# Patient Record
Sex: Male | Born: 1955
Health system: Southern US, Community
[De-identification: ages and names within clinical notes are randomized; demographics above are authoritative.]

## PROBLEM LIST (undated history)

## (undated) DIAGNOSIS — D494 Neoplasm of unspecified behavior of bladder: Secondary | ICD-10-CM

## (undated) DIAGNOSIS — I10 Essential (primary) hypertension: Secondary | ICD-10-CM

## (undated) DIAGNOSIS — R351 Nocturia: Secondary | ICD-10-CM

## (undated) DIAGNOSIS — E559 Vitamin D deficiency, unspecified: Secondary | ICD-10-CM

## (undated) DIAGNOSIS — C679 Malignant neoplasm of bladder, unspecified: Secondary | ICD-10-CM

## (undated) DIAGNOSIS — J4489 Other specified chronic obstructive pulmonary disease: Secondary | ICD-10-CM

## (undated) DIAGNOSIS — Z9289 Personal history of other medical treatment: Secondary | ICD-10-CM

## (undated) DIAGNOSIS — R630 Anorexia: Secondary | ICD-10-CM

## (undated) DIAGNOSIS — B20 Human immunodeficiency virus [HIV] disease: Secondary | ICD-10-CM

## (undated) DIAGNOSIS — J449 Chronic obstructive pulmonary disease, unspecified: Secondary | ICD-10-CM

## (undated) HISTORY — PX: JOINT REPLACEMENT: SHX530

## (undated) HISTORY — PX: TRANSURETHRAL RESECTION OF BLADDER TUMOR: SHX2575

## (undated) HISTORY — PX: LUMBAR DISC SURGERY: SHX700

## (undated) HISTORY — DX: Anorexia: R63.0

## (undated) HISTORY — PX: TOTAL HIP ARTHROPLASTY: SHX124

---
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
## 2012-08-04 HISTORY — PX: CATARACT EXTRACTION W/ INTRAOCULAR LENS  IMPLANT, BILATERAL: SHX1307

## 2014-04-10 ENCOUNTER — Encounter (HOSPITAL_COMMUNITY): Payer: Self-pay | Admitting: Emergency Medicine

## 2014-04-10 ENCOUNTER — Emergency Department (HOSPITAL_COMMUNITY)
Admission: EM | Admit: 2014-04-10 | Discharge: 2014-04-10 | Disposition: A | Discharge status: Home / Self Care | Payer: Medicare Other | Attending: Emergency Medicine | Admitting: Emergency Medicine

## 2014-04-10 DIAGNOSIS — Z859 Personal history of malignant neoplasm, unspecified: Secondary | ICD-10-CM | POA: Insufficient documentation

## 2014-04-10 DIAGNOSIS — J4489 Other specified chronic obstructive pulmonary disease: Secondary | ICD-10-CM | POA: Insufficient documentation

## 2014-04-10 DIAGNOSIS — F17200 Nicotine dependence, unspecified, uncomplicated: Secondary | ICD-10-CM | POA: Diagnosis not present

## 2014-04-10 DIAGNOSIS — J449 Chronic obstructive pulmonary disease, unspecified: Secondary | ICD-10-CM | POA: Insufficient documentation

## 2014-04-10 DIAGNOSIS — Z21 Asymptomatic human immunodeficiency virus [HIV] infection status: Secondary | ICD-10-CM | POA: Insufficient documentation

## 2014-04-10 DIAGNOSIS — E871 Hypo-osmolality and hyponatremia: Secondary | ICD-10-CM | POA: Diagnosis not present

## 2014-04-10 DIAGNOSIS — C679 Malignant neoplasm of bladder, unspecified: Secondary | ICD-10-CM | POA: Diagnosis not present

## 2014-04-10 LAB — CBC WITH DIFFERENTIAL/PLATELET
BASOS PCT: 0 % (ref 0–1)
Basophils Absolute: 0 10*3/uL (ref 0.0–0.1)
Eosinophils Absolute: 0.2 10*3/uL (ref 0.0–0.7)
Eosinophils Relative: 3 % (ref 0–5)
HCT: 37.5 % — ABNORMAL LOW (ref 39.0–52.0)
Hemoglobin: 13.3 g/dL (ref 13.0–17.0)
Lymphocytes Relative: 46 % (ref 12–46)
Lymphs Abs: 2.4 10*3/uL (ref 0.7–4.0)
MCH: 33.7 pg (ref 26.0–34.0)
MCHC: 35.5 g/dL (ref 30.0–36.0)
MCV: 94.9 fL (ref 78.0–100.0)
Monocytes Absolute: 0.6 10*3/uL (ref 0.1–1.0)
Monocytes Relative: 11 % (ref 3–12)
NEUTROS PCT: 40 % — AB (ref 43–77)
Neutro Abs: 2.1 10*3/uL (ref 1.7–7.7)
PLATELETS: 206 10*3/uL (ref 150–400)
RBC: 3.95 MIL/uL — ABNORMAL LOW (ref 4.22–5.81)
RDW: 13 % (ref 11.5–15.5)
WBC: 5.2 10*3/uL (ref 4.0–10.5)

## 2014-04-10 LAB — COMPREHENSIVE METABOLIC PANEL
ALK PHOS: 121 U/L — AB (ref 39–117)
ALT: 20 U/L (ref 0–53)
ANION GAP: 17 — AB (ref 5–15)
AST: 58 U/L — ABNORMAL HIGH (ref 0–37)
Albumin: 3.8 g/dL (ref 3.5–5.2)
BUN: 9 mg/dL (ref 6–23)
CO2: 21 mEq/L (ref 19–32)
Calcium: 9.4 mg/dL (ref 8.4–10.5)
Chloride: 90 mEq/L — ABNORMAL LOW (ref 96–112)
Creatinine, Ser: 0.81 mg/dL (ref 0.50–1.35)
GFR calc Af Amer: >90 mL/min (ref >90)
GFR calc non Af Amer: >90 mL/min (ref >90)
Glucose, Bld: 81 mg/dL (ref 70–99)
POTASSIUM: 4.6 meq/L (ref 3.7–5.3)
SODIUM: 128 meq/L — AB (ref 137–147)
TOTAL PROTEIN: 9.8 g/dL — AB (ref 6.0–8.3)
Total Bilirubin: 0.3 mg/dL (ref 0.3–1.2)

## 2014-04-10 LAB — URINALYSIS, ROUTINE W REFLEX MICROSCOPIC
BILIRUBIN URINE: NEGATIVE
Glucose, UA: NEGATIVE mg/dL
Ketones, ur: NEGATIVE mg/dL
Leukocytes, UA: NEGATIVE
NITRITE: NEGATIVE
PH: 5.5 (ref 5.0–8.0)
Protein, ur: NEGATIVE mg/dL
SPECIFIC GRAVITY, URINE: 1.011 (ref 1.005–1.030)
Urobilinogen, UA: 0.2 mg/dL (ref 0.0–1.0)

## 2014-04-10 LAB — ETHANOL: Alcohol, Ethyl (B): 269 mg/dL — ABNORMAL HIGH (ref 0–11)

## 2014-04-10 LAB — URINE MICROSCOPIC-ADD ON

## 2014-04-10 MED ORDER — SODIUM CHLORIDE 0.9 % IV BOLUS (SEPSIS): 500.0000 mL | Freq: Once | INTRAVENOUS | Status: DC

## 2014-04-10 MED ORDER — POLYETHYLENE GLYCOL 3350 17 G PO PACK: 17.0000 g | PACK | Freq: Every day | ORAL | Status: DC

## 2014-04-10 MED ORDER — OXYCODONE-ACETAMINOPHEN 5-325 MG PO TABS
1.0000 | ORAL_TABLET | Freq: Once | ORAL | Status: AC
  Administered 2014-04-10: 1 via ORAL
  Filled 2014-04-10: qty 1

## 2014-04-10 MED ORDER — OXYCODONE-ACETAMINOPHEN 5-325 MG PO TABS: 1.0000 | ORAL_TABLET | Freq: Four times a day (QID) | ORAL | Status: DC | PRN

## 2014-04-10 MED ORDER — MORPHINE SULFATE 4 MG/ML IJ SOLN: 4.0000 mg | Freq: Once | INTRAMUSCULAR | Status: DC

## 2014-04-10 NOTE — ED Notes (Signed)
Per EMS, pt from home. Daughter called EMS due to pt c/o abdominal pain. Pt has hx of bladder cancer but hasn't been able to afford treatments. Pt has had no treatment in 3 months. Pt's pain 4/10 but pt sts that it will spike up to a 10/10. Pt also c/o general fatigue. Pt did not want to come. Pt currently intoxicated. A&Ox4. 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 Allergic to aspirin. Daughter is concerned cancer has spread to lungs.

## 2014-04-10 NOTE — Progress Notes (Signed)
  CARE MANAGEMENT ED NOTE 04/10/2014  Patient:  [facility]   Account Number:  [id number]  Date Initiated:  04/10/2014  Documentation initiated by:  Livia Snellen  Subjective/Objective Assessment:   Patient presents to Ed with abdominal pain.     Subjective/Objective Assessment Detail:   Patient with pmhx of bladder cancer.  Patient has not had chemo treatments in three months.     Action/Plan:   Action/Plan Detail:   Anticipated DC Date:       Status Recommendation to Physician:   Result of Recommendation:    Other ED Services  Consult Working [place]  CM consult  Other  PCP issues    Choice offered to / List presented to:            Status of service:  Completed, signed off  ED Comments:   ED Comments Detail:  EDCM spoke to patient and his daughter at bedside.  Patient confirms he has Medicare and Medicaid insurance.  Patient reports he has moved here from [facility].  Patient has switched his Medicaid  to [facility].  [facility] asked patient why he was unable to receive treatments for his cancer for three months?  Patient responded, "Because of my Medicaid and the move."  Patient reports his pcp is located at [facility] in [facility], Dr. Jarrett Ables [phone].  As per patient and patient's daughter, Dr. Jarrett Ables referred patient to [facility] cancer center.  When patient's daughter showed [facility] the appointment it was for Dr. Louis Meckel a urologist on [address]  EDCM explained to patient and his daughter that this appointment was not for the cancer center but for a urologist.  Patient has an appointment with Dr. Louis Meckel on Sept 17 at 0930am with Dr. Louis Meckel.  [facility] provided patient with a list of pcps who accept Medicare insurance within a 10 mile radius of patient's zip code [address].  EDCM also provided patient and his daughter with phone number and address to [facility] cancer center.  Patient and patient's daughter thankful for resources.  No further EDCM needs at this  time.

## 2014-04-10 NOTE — ED Notes (Signed)
Pt refusing IV medication and fluids. MD made aware.

## 2014-04-10 NOTE — ED Notes (Signed)
Bed: WA02 Expected date:  Expected time:  Means of arrival:  Comments: EMS- abdominal pain, Hx of bladder CA

## 2014-04-10 NOTE — Discharge Instructions (Signed)
Bladder Cancer Bladder cancer is an abnormal growth of tissue in your bladder. Your bladder is the balloon-like sac in your pelvis. It collects and stores urine that comes from the kidneys through the ureters. The bladder wall is made of layers. If cancer spreads into these layers and through the wall of the bladder, it becomes more difficult to treat.  There are four stages of bladder cancer:  Stage I. Cancer at this stage occurs in the bladder's inner lining but has not invaded the muscular bladder wall.  Stage II. At this stage, cancer has invaded the bladder wall but is still confined to the bladder.  Stage III. By this stage, the cancer cells have spread through the bladder wall to surrounding tissue. They may also have spread to the prostate in men or the uterus or vagina in women.  Stage IV. By this stage, cancer cells may have spread to the lymph nodes and other organs, such as your lungs, bones, or liver. RISK FACTORS Although the cause of bladder cancer is not known, the following risk factors can increase your chances of getting bladder cancer:   Smoking.   Occupational exposures, such as rubber, leather, textile, dyes, chemicals, and paint.  Being white.  Age.   Being male.   Having chronic bladder inflammation.   Having a bladder cancer history.   Having a family history of bladder cancer (heredity).   Having had chemotherapy or radiation therapy to the pelvis.   Being exposed to arsenic.  SYMPTOMS   Blood in the urine.   Pain with urination.   Frequent bladder or urine infections.  Increase in urgency and frequency of urination. DIAGNOSIS  Your health care provider may suspect bladder cancer based on your description of urinary symptoms or based on the finding of blood or infection in the urine (especially if this has recurred several times). Other tests or procedures that may be performed include:   A narrow tube being inserted into your bladder  through your urethra (cystoscopy) in order to view the lining of your bladder for tumors.   A biopsy to sample the tumor to see if cancer is present.  If cancer is present, it will then be staged to determine its severity and extent. It is important to know how deeply into the bladder wall the cancer has grown and whether the cancer has spread to any other parts of your body. Staging may require blood tests or special scans such as a CT scan, MRI, bone scan, or chest X-ray.  TREATMENT  Once your cancer has been diagnosed and staged, you should discuss a treatment plan with your health care provider. Based on the stage of the cancer, one treatment or a combination of treatments may be recommended. The most common forms of treatment are:   Surgery. Procedures that may be done include transurethral resection and cystectomy.  Radiation therapy. This is infrequently used to treat bladder cancer.   Chemotherapy. During this treatment, drugs are used to kill cancer cells.  Immunotherapy. This is usually administered directly into the bladder. HOME CARE INSTRUCTIONS  Take medicines only as directed by your health care provider.   Maintain a healthy diet.   Consider joining a support group. This may help you learn to cope with the stress of having bladder cancer.   Seek advice to help you manage treatment side effects.   Keep all follow-up visits as directed by your health care provider.   Inform your cancer specialist if you are  admitted to the hospital.  [place] IF:  There is blood in your urine.  You have symptoms of a urinary tract infection. These include:  Tiredness.  Shakiness.  Weakness.  Muscle aches.  Abdominal pain.  Frequent and intense urge to urinate (in young women).  Burning feeling in the bladder or urethra during urination (in young women). SEEK IMMEDIATE MEDICAL CARE IF:  You are unable to urinate. Document Released: 07/24/2003 Document  Revised: 12/05/2013 Document Reviewed: 01/11/2013 [facility] Patient Information 2015 [place], [place]. This information is not intended to replace advice given to you by your health care provider. Make sure you discuss any questions you have with your health care provider.  Hyponatremia  Hyponatremia is when the amount of salt (sodium) in your blood is too low. When sodium levels are low, your cells will absorb extra water and swell. The swelling happens throughout the body, but it mostly affects the brain. Severe brain swelling (cerebral edema), seizures, or coma can happen.  CAUSES   Heart, kidney, or liver problems.  Thyroid problems.  Adrenal gland problems.  Severe vomiting and diarrhea.  Certain medicines or illegal drugs.  Dehydration.  Drinking too much water.  Low-sodium diet. SYMPTOMS   Nausea and vomiting.  Confusion.  Lethargy.  Agitation.  Headache.  Twitching or shaking (seizures).  Unconsciousness.  Appetite loss.  Muscle weakness and cramping. DIAGNOSIS  Hyponatremia is identified by a simple blood test. Your caregiver will perform a history and physical exam to try to find the cause and type of hyponatremia. Other tests may be needed to measure the amount of sodium in your blood and urine. TREATMENT  Treatment will depend on the cause.   Fluids may be given through the vein (IV).  Medicines may be used to correct the sodium imbalance. If medicines are causing the problem, they will need to be adjusted.  Water or fluid intake may be restricted to restore proper balance. The speed of correcting the sodium problem is very important. If the problem is corrected too fast, nerve damage (sometimes unchangeable) can happen. HOME CARE INSTRUCTIONS   Only take medicines as directed by your caregiver. Many medicines can make hyponatremia worse. Discuss all your medicines with your caregiver.  Carefully follow any recommended diet, including any fluid  restrictions.  You may be asked to repeat lab tests. Follow these directions.  Avoid alcohol and recreational drugs. SEEK MEDICAL CARE IF:   You develop worsening nausea, fatigue, headache, confusion, or weakness.  Your original hyponatremia symptoms return.  You have problems following the recommended diet. SEEK IMMEDIATE MEDICAL CARE IF:   You have a seizure.  You faint.  You have ongoing diarrhea or vomiting. MAKE SURE YOU:   Understand these instructions.  Will watch your condition.  Will get help right away if you are not doing well or get worse. Document Released: 07/11/2002 Document Revised: 10/13/2011 Document Reviewed: 01/05/2011 [facility] Patient Information 2015 [place], [place]. This information is not intended to replace advice given to you by your health care provider. Make sure you discuss any questions you have with your health care provider.

## 2014-04-10 NOTE — ED Provider Notes (Signed)
CSN: [id number]     Arrival date & time 04/10/14  1756 History   First MD Initiated Contact with Patient 04/10/14 1809     Chief Complaint  Patient presents with  . Bladder Cancer     (Consider location/radiation/quality/duration/timing/severity/associated sxs/prior Treatment) The history is provided by the patient.   patient presents with lower abdominal pain. He has a history of reported bladder cancer. He has been off his treatment for around 3 months. He was previously being treated in [facility]. Mood appeared to be by his family. He he was unable to get medications appear due to insurance reasons. His appointment to followup with urology. He does not have a primary care doctor or primary care appointment. I called his urologist from [place], but the numbers did not go through. He states the pain comes and goes. He states it will weakness leg swelling comes on. No diarrhea or constipation. He states that he drank 2 beers today. Also reportedly has HIV, but was not one of his family to know.  Past Medical History  Diagnosis Date  . Cancer   . Asthma   . COPD (chronic obstructive pulmonary disease)    Past Surgical History  Procedure Laterality Date  . Joint replacement     No family history on file. History  Substance Use Topics  . Smoking status: Current Every Day Smoker -- 0.50 packs/day  . Smokeless tobacco: Not on file  . Alcohol Use: Yes    Review of Systems  Constitutional: Positive for appetite change. Negative for activity change.  Eyes: Negative for pain.  Respiratory: Negative for chest tightness and shortness of breath.   Cardiovascular: Negative for chest pain and leg swelling.  Gastrointestinal: Positive for abdominal pain. Negative for nausea, vomiting and diarrhea.  Genitourinary: Negative for flank pain.  Musculoskeletal: Negative for back pain and neck stiffness.  Skin: Negative for rash.  Neurological: Negative for weakness, numbness and  headaches.  Psychiatric/Behavioral: Negative for behavioral problems.      Allergies  Aspirin  Home Medications   Prior to Admission medications   Medication Sig Start Date End Date Taking? Authorizing Provider  PRESCRIPTION MEDICATION Not sure of name or dosing.   Yes Historical Provider, MD   BP 166/67  Pulse 67  Temp(Src) 98.2 F (36.8 C) (Oral)  Resp 16  SpO2 100% Physical Exam  Nursing note and vitals reviewed. Constitutional: He is oriented to person, place, and time. He appears well-developed and well-nourished.  HENT:  Head: Normocephalic and atraumatic.  Eyes: EOM are normal. Pupils are equal, round, and reactive to light.  Neck: Normal range of motion. Neck supple. JVD present.  Cardiovascular: Normal rate, regular rhythm and normal heart sounds.   No murmur heard. Pulmonary/Chest: Effort normal and breath sounds normal.  Abdominal: Soft. Bowel sounds are normal. He exhibits no distension and no mass. There is tenderness. There is no rebound and no guarding.  Minimal lower abdominal pain without rebound or guarding.  Musculoskeletal: Normal range of motion. He exhibits no edema.  Neurological: He is alert and oriented to person, place, and time. No cranial nerve deficit.  Skin: Skin is warm and dry.  Psychiatric: He has a normal mood and affect.    ED Course  Procedures (including critical care time) Labs Review Labs Reviewed  CBC WITH DIFFERENTIAL - Abnormal; Notable for the following:    RBC 3.95 (*)    HCT 37.5 (*)    Neutrophils Relative % 40 (*)  All other components within normal limits  COMPREHENSIVE METABOLIC PANEL - Abnormal; Notable for the following:    Sodium 128 (*)    Chloride 90 (*)    Total Protein 9.8 (*)    AST 58 (*)    Alkaline Phosphatase 121 (*)    Anion gap 17 (*)    All other components within normal limits  URINALYSIS, ROUTINE W REFLEX MICROSCOPIC - Abnormal; Notable for the following:    Hgb urine dipstick TRACE (*)     All other components within normal limits  ETHANOL - Abnormal; Notable for the following:    Alcohol, Ethyl (B) 269 (*)    All other components within normal limits  URINE MICROSCOPIC-ADD ON    Imaging Review No results found.   EKG Interpretation None      MDM   Final diagnoses:  Malignant neoplasm of urinary bladder, unspecified site  Hyponatremia    Patient presents with reported bladder cancer. His been off his medications. Has followup. He is somewhat intoxicated. Mild hyponatremia. Patient refused IV. Patient has been seen by social work who gave him resources in terms of finding a PCP. He has urology followup. He'll likely need oncology followup. Will discharge home with some pain medicines. Laboratory reassuring.    Jasper Riling. Alvino Chapel, MD 04/10/14 2120

## 2014-05-09 ENCOUNTER — Other Ambulatory Visit: Payer: Self-pay | Admitting: Urology

## 2014-05-25 ENCOUNTER — Encounter (HOSPITAL_BASED_OUTPATIENT_CLINIC_OR_DEPARTMENT_OTHER): Payer: Self-pay | Admitting: *Deleted

## 2014-05-26 ENCOUNTER — Encounter (HOSPITAL_BASED_OUTPATIENT_CLINIC_OR_DEPARTMENT_OTHER): Payer: Self-pay | Admitting: *Deleted

## 2014-05-26 NOTE — Progress Notes (Addendum)
NPO AFTER MN. ARRIVE AT 1000. NEEDS ISTAT AND EKG. WILL BRING RESCUE INHALER.  PT  TO BRING BP MEDICATION BOTTLE DOS , PT UNABLE TO SEE NAME ON BOTTLE BUT STATES TAKES IN THE EVENING. PLEASE UPDATE MED. LIST.

## 2014-05-30 NOTE — H&P (Signed)
Reason For Visit Follow-up for bladder cancer   History of Present Illness 33M presents to establish care. He has a history of bladder cancer and has been treated in [place], [place].   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     Intv: Patient moved to [facility] and needs urologist. Records reviewed. History as above. Due for repeat cysto after BCG induction. Patient denies any worsening of his voiding symptoms including dysuria, hematuria, or frequency/urgency. No incontinence.   Past Medical History Problems  1. History of asthma (Z87.09) 2. History of bladder cancer (Z85.51) 3. History of chronic obstructive lung disease (Z87.09) 4. History of hypertension (Z86.79) 5. History of stomach ulcers (Z87.19) 6. History of Hyponatremia (E87.1)  Surgical History Problems  1. History of Back Surgery 2. History of Bladder Surgery 3. History of Cataract Surgery 4. History of Hip Replacement  Current Meds 1. MiraLax Oral Powder;  Therapy: (Recorded:17Sep2015) to Recorded 2. Oxycodone-Acetaminophen 5-325 MG Oral Tablet;  Therapy: (Recorded:17Sep2015) to Recorded  Allergies Medication  1. Aspirin TABS  Family History Problems  1. Family history of Death of family member : Mother, Father   Mother at age42; heart attackFather; unknown 2. Family history of malignant neoplasm of breast (Z80.3) : Grandmother 3. Family history of myocardial infarction (Z82.49) : Mother  Social History Problems  1. Alcohol use (F10.99)   2 beers a day 2. Caffeine use (F15.90)   5-6 tea per day 3. Current every day smoker (F17.200)   0.50  packs/day for 17 yrs 4. Disabled 5. Number of children   2 sons and 4 daughters 66. Separated from significant other (Z63.5)  Vitals Vital Signs [Data Includes: Last 1 Day]  Recorded: 06Oct2015 10:13AM  Height: 5 ft 4 in Weight: 134 lb  BMI Calculated: 23 BSA Calculated: 1.65 Blood Pressure: 150 / 72 Heart Rate: 79  Results/Data Urine [Data Includes: Last 1 Day]   06Oct2015  COLOR AMBER   APPEARANCE CLEAR   SPECIFIC GRAVITY 1.010   pH 5.0   GLUCOSE NEG mg/dL  BILIRUBIN NEG   KETONE NEG mg/dL  BLOOD MOD   PROTEIN NEG mg/dL  UROBILINOGEN 0.2 mg/dL  NITRITE NEG   LEUKOCYTE ESTERASE SMALL   SQUAMOUS EPITHELIAL/HPF MODERATE   WBC 3-6 WBC/hpf  RBC 0-2 RBC/hpf  BACTERIA FEW   CRYSTALS NONE SEEN   CASTS NONE SEEN    Procedure  Procedure: Cystoscopy   Indication: History of Urothelial Carcinoma.  Informed Consent: from the patient . Specific risks including, but not limited to bleeding, infection, pain, allergic reaction etc. were explained.  Prep: The patient was prepped with hibiclens.  Anesthesia:. Local anesthesia was administered intraurethrally with 2% lidocaine jelly.  Antibiotic prophylaxis: Ciprofloxacin.  Procedure Note:  Urethral meatus:. No abnormalities.  Anterior urethra: No abnormalities.  Prostatic urethra:. Was identified. (Patient has suspicious flat area at the bladder neck/prostatic urethra at the 6:00 area concerning for CIS).  Bladder: Visulization was clear. The ureteral orifices were in the normal anatomic position bilaterally and had clear efflux of urine. A systematic survey of the bladder  demonstrated no bladder tumors or stones. The patient tolerated the procedure well.  Complications: None.    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 He is status post 6 weeks of BCG which completed in May 2015. 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   Plan Health Maintenance  1. UA With REFLEX; [Do Not Release]; Status:Complete;   Done: 06Oct2015 09:54AM Malignant neoplasm of urinary bladder, unspecified site  2. Follow-up Schedule Surgery Office  Follow-up  Status: Hold For - Appointment   Requested for: 06Oct2015 3. Cysto; Status:Complete;   Done: [id number]  Discussion/Summary The plan is to biopsy the patient's prostatic urethra in the OR. He is due for upper tract imaging, we will perform bilateral retrogrades simultaneously.

## 2014-05-31 ENCOUNTER — Encounter (HOSPITAL_BASED_OUTPATIENT_CLINIC_OR_DEPARTMENT_OTHER)
Admission: RE | Disposition: A | Discharge status: Home / Self Care | Payer: Medicare Other | Source: Ambulatory Visit | Attending: Urology

## 2014-05-31 ENCOUNTER — Encounter (HOSPITAL_BASED_OUTPATIENT_CLINIC_OR_DEPARTMENT_OTHER): Payer: Self-pay | Admitting: *Deleted

## 2014-05-31 ENCOUNTER — Ambulatory Visit (HOSPITAL_BASED_OUTPATIENT_CLINIC_OR_DEPARTMENT_OTHER): Payer: Medicare Other | Admitting: Anesthesiology

## 2014-05-31 ENCOUNTER — Encounter (HOSPITAL_BASED_OUTPATIENT_CLINIC_OR_DEPARTMENT_OTHER): Payer: Medicare Other | Admitting: Anesthesiology

## 2014-05-31 ENCOUNTER — Ambulatory Visit (HOSPITAL_BASED_OUTPATIENT_CLINIC_OR_DEPARTMENT_OTHER)
Admission: RE | Admit: 2014-05-31 | Discharge: 2014-05-31 | Disposition: A | Discharge status: Home / Self Care | Payer: Medicare Other | Source: Ambulatory Visit | Attending: Urology | Admitting: Urology

## 2014-05-31 ENCOUNTER — Ambulatory Visit (HOSPITAL_COMMUNITY): Payer: Medicare Other

## 2014-05-31 DIAGNOSIS — J45909 Unspecified asthma, uncomplicated: Secondary | ICD-10-CM | POA: Diagnosis not present

## 2014-05-31 DIAGNOSIS — N3080 Other cystitis without hematuria: Secondary | ICD-10-CM | POA: Diagnosis not present

## 2014-05-31 DIAGNOSIS — I10 Essential (primary) hypertension: Secondary | ICD-10-CM | POA: Insufficient documentation

## 2014-05-31 DIAGNOSIS — Z419 Encounter for procedure for purposes other than remedying health state, unspecified: Secondary | ICD-10-CM

## 2014-05-31 DIAGNOSIS — Z8551 Personal history of malignant neoplasm of bladder: Secondary | ICD-10-CM | POA: Insufficient documentation

## 2014-05-31 DIAGNOSIS — F17210 Nicotine dependence, cigarettes, uncomplicated: Secondary | ICD-10-CM | POA: Diagnosis not present

## 2014-05-31 DIAGNOSIS — N3289 Other specified disorders of bladder: Secondary | ICD-10-CM | POA: Diagnosis present

## 2014-05-31 DIAGNOSIS — J449 Chronic obstructive pulmonary disease, unspecified: Secondary | ICD-10-CM | POA: Insufficient documentation

## 2014-05-31 HISTORY — PX: FULGURATION OF BLADDER TUMOR: SHX6261

## 2014-05-31 HISTORY — DX: Essential (primary) hypertension: I10

## 2014-05-31 HISTORY — DX: Chronic obstructive pulmonary disease, unspecified: J44.9

## 2014-05-31 HISTORY — DX: Other specified chronic obstructive pulmonary disease: J44.89

## 2014-05-31 HISTORY — DX: Nocturia: R35.1

## 2014-05-31 HISTORY — DX: Malignant neoplasm of bladder, unspecified: C67.9

## 2014-05-31 LAB — POCT I-STAT 4, (NA,K, GLUC, HGB,HCT)
GLUCOSE: 102 mg/dL — AB (ref 70–99)
HEMATOCRIT: 37 % — AB (ref 39.0–52.0)
Hemoglobin: 12.6 g/dL — ABNORMAL LOW (ref 13.0–17.0)
Potassium: 3.9 mEq/L (ref 3.7–5.3)
SODIUM: 139 meq/L (ref 137–147)

## 2014-05-31 SURGERY — FULGURATION, NEOPLASM, BLADDER
Anesthesia: General | Site: Bladder | Laterality: Bilateral

## 2014-05-31 MED ORDER — CIPROFLOXACIN IN D5W 400 MG/200ML IV SOLN
400.0000 mg | INTRAVENOUS | Status: AC
  Administered 2014-05-31: 400 mg via INTRAVENOUS
  Filled 2014-05-31: qty 200

## 2014-05-31 MED ORDER — FENTANYL CITRATE 0.05 MG/ML IJ SOLN
INTRAMUSCULAR | Status: DC | PRN
  Administered 2014-05-31: 25 ug via INTRAVENOUS
  Administered 2014-05-31: 50 ug via INTRAVENOUS
  Administered 2014-05-31: 25 ug via INTRAVENOUS

## 2014-05-31 MED ORDER — MEPERIDINE HCL 25 MG/ML IJ SOLN
6.2500 mg | INTRAMUSCULAR | Status: DC | PRN
  Filled 2014-05-31: qty 1

## 2014-05-31 MED ORDER — OXYCODONE-ACETAMINOPHEN 5-325 MG PO TABS: 1.0000 | ORAL_TABLET | Freq: Four times a day (QID) | ORAL | Status: DC | PRN

## 2014-05-31 MED ORDER — PHENAZOPYRIDINE HCL 200 MG PO TABS: 200.0000 mg | ORAL_TABLET | Freq: Three times a day (TID) | ORAL | Status: DC | PRN

## 2014-05-31 MED ORDER — PROMETHAZINE HCL 25 MG/ML IJ SOLN
6.2500 mg | INTRAMUSCULAR | Status: DC | PRN
  Filled 2014-05-31: qty 1

## 2014-05-31 MED ORDER — PROPOFOL 10 MG/ML IV BOLUS
INTRAVENOUS | Status: DC | PRN
  Administered 2014-05-31: 200 mg via INTRAVENOUS

## 2014-05-31 MED ORDER — CIPROFLOXACIN IN D5W 400 MG/200ML IV SOLN
INTRAVENOUS | Status: AC
  Filled 2014-05-31: qty 200

## 2014-05-31 MED ORDER — IOHEXOL 350 MG/ML SOLN
INTRAVENOUS | Status: DC | PRN
  Administered 2014-05-31: 10 mL

## 2014-05-31 MED ORDER — LACTATED RINGERS IV SOLN
INTRAVENOUS | Status: DC
  Administered 2014-05-31: 11:00:00 via INTRAVENOUS
  Filled 2014-05-31: qty 1000

## 2014-05-31 MED ORDER — FENTANYL CITRATE 0.05 MG/ML IJ SOLN
INTRAMUSCULAR | Status: AC
  Filled 2014-05-31: qty 4

## 2014-05-31 MED ORDER — ONDANSETRON HCL 4 MG/2ML IJ SOLN
INTRAMUSCULAR | Status: DC | PRN
  Administered 2014-05-31: 4 mg via INTRAVENOUS

## 2014-05-31 MED ORDER — PHENAZOPYRIDINE HCL 200 MG PO TABS
200.0000 mg | ORAL_TABLET | Freq: Once | ORAL | Status: AC
  Administered 2014-05-31: 200 mg via ORAL
  Filled 2014-05-31: qty 1

## 2014-05-31 MED ORDER — OXYCODONE HCL 5 MG PO TABS
ORAL_TABLET | ORAL | Status: AC
  Filled 2014-05-31: qty 1

## 2014-05-31 MED ORDER — BELLADONNA ALKALOIDS-OPIUM 16.2-60 MG RE SUPP
RECTAL | Status: DC | PRN
  Administered 2014-05-31: 1 via RECTAL

## 2014-05-31 MED ORDER — MIDAZOLAM HCL 5 MG/5ML IJ SOLN
INTRAMUSCULAR | Status: DC | PRN
  Administered 2014-05-31 (×2): 1 mg via INTRAVENOUS

## 2014-05-31 MED ORDER — LACTATED RINGERS IV SOLN
INTRAVENOUS | Status: DC | PRN
  Administered 2014-05-31 (×2): via INTRAVENOUS

## 2014-05-31 MED ORDER — BELLADONNA ALKALOIDS-OPIUM 16.2-60 MG RE SUPP
RECTAL | Status: AC
  Filled 2014-05-31: qty 1

## 2014-05-31 MED ORDER — SODIUM CHLORIDE 0.9 % IR SOLN
Status: DC | PRN
  Administered 2014-05-31: 3000 mL via INTRAVESICAL

## 2014-05-31 MED ORDER — ACETAMINOPHEN 10 MG/ML IV SOLN
INTRAVENOUS | Status: DC | PRN
  Administered 2014-05-31: 1000 mg via INTRAVENOUS

## 2014-05-31 MED ORDER — HYDROMORPHONE HCL 1 MG/ML IJ SOLN
0.2500 mg | INTRAMUSCULAR | Status: DC | PRN
  Filled 2014-05-31: qty 1

## 2014-05-31 MED ORDER — STERILE WATER FOR IRRIGATION IR SOLN
Status: DC | PRN
  Administered 2014-05-31: 3000 mL

## 2014-05-31 MED ORDER — PHENAZOPYRIDINE HCL 100 MG PO TABS
ORAL_TABLET | ORAL | Status: AC
  Filled 2014-05-31: qty 2

## 2014-05-31 MED ORDER — OXYCODONE HCL 5 MG/5ML PO SOLN
5.0000 mg | Freq: Once | ORAL | Status: AC | PRN
  Filled 2014-05-31: qty 5

## 2014-05-31 MED ORDER — DEXAMETHASONE SODIUM PHOSPHATE 4 MG/ML IJ SOLN
INTRAMUSCULAR | Status: DC | PRN
  Administered 2014-05-31: 10 mg via INTRAVENOUS

## 2014-05-31 MED ORDER — LIDOCAINE HCL (CARDIAC) 20 MG/ML IV SOLN
INTRAVENOUS | Status: DC | PRN
  Administered 2014-05-31: 80 mg via INTRAVENOUS

## 2014-05-31 MED ORDER — MIDAZOLAM HCL 2 MG/2ML IJ SOLN
INTRAMUSCULAR | Status: AC
  Filled 2014-05-31: qty 2

## 2014-05-31 MED ORDER — LIDOCAINE HCL 2 % EX GEL
CUTANEOUS | Status: DC | PRN
  Administered 2014-05-31: 1 via URETHRAL

## 2014-05-31 MED ORDER — OXYCODONE HCL 5 MG PO TABS
5.0000 mg | ORAL_TABLET | Freq: Once | ORAL | Status: AC | PRN
  Administered 2014-05-31: 5 mg via ORAL
  Filled 2014-05-31: qty 1

## 2014-05-31 SURGICAL SUPPLY — 26 items
BAG URINE DRAINAGE (UROLOGICAL SUPPLIES) ×1 IMPLANT
BAG URO CATCHER STRL LF (DRAPE) ×3 IMPLANT
BLADE SURG 15 STRL LF DISP TIS (BLADE) IMPLANT
BLADE SURG 15 STRL SS (BLADE)
CATH FOLEY 3WAY 30CC 22FR (CATHETERS) ×1 IMPLANT
DRAPE CAMERA CLOSED 9X96 (DRAPES) ×3 IMPLANT
ELECT BUTTON HF 24-28F 2 30DE (ELECTRODE) ×1 IMPLANT
ELECT HF RESECT BIPO 24F 45 ND (CUTTING LOOP) ×1 IMPLANT
ELECT LOOP MED HF 24F 12D (CUTTING LOOP) ×1 IMPLANT
ELECT REM PT RETURN 9FT ADLT (ELECTROSURGICAL) ×3
ELECTRODE REM PT RTRN 9FT ADLT (ELECTROSURGICAL) ×1 IMPLANT
EVACUATOR MICROVAS BLADDER (UROLOGICAL SUPPLIES) ×1 IMPLANT
GLOVE BIO SURGEON STRL SZ8 (GLOVE) ×3 IMPLANT
GLOVE BIOGEL M 6.5 STRL (GLOVE) ×2 IMPLANT
GLOVE BIOGEL PI IND STRL 6.5 (GLOVE) IMPLANT
GLOVE BIOGEL PI INDICATOR 6.5 (GLOVE) ×2
GOWN STRL REUS W/ TWL XL LVL3 (GOWN DISPOSABLE) ×1 IMPLANT
GOWN STRL REUS W/TWL LRG LVL3 (GOWN DISPOSABLE) ×2 IMPLANT
GOWN STRL REUS W/TWL XL LVL3 (GOWN DISPOSABLE) ×3 IMPLANT
HOLDER FOLEY CATH W/STRAP (MISCELLANEOUS) IMPLANT
IV NS IRRIG 3000ML ARTHROMATIC (IV SOLUTION) ×6 IMPLANT
PACK CYSTO (CUSTOM PROCEDURE TRAY) ×3 IMPLANT
SET ASPIRATION TUBING (TUBING) ×1 IMPLANT
SUT ETHILON 3 0 PS 1 (SUTURE) IMPLANT
SYR 30ML LL (SYRINGE) IMPLANT
WATER STERILE IRR 3000ML UROMA (IV SOLUTION) ×2 IMPLANT

## 2014-05-31 NOTE — Anesthesia Procedure Notes (Signed)
Procedure Name: LMA Insertion Date/Time: 05/31/2014 12:47 PM Performed by: Justice Rocher Pre-anesthesia Checklist: Patient identified, Emergency Drugs available, Suction available and Patient being monitored Patient Re-evaluated:Patient Re-evaluated prior to inductionOxygen Delivery Method: Circle System Utilized Preoxygenation: Pre-oxygenation with 100% oxygen Intubation Type: IV induction Ventilation: Mask ventilation without difficulty LMA: LMA inserted LMA Size: 4.0 Number of attempts: 1 Airway Equipment and Method: bite block Placement Confirmation: positive ETCO2 Tube secured with: Tape Dental Injury: Teeth and Oropharynx as per pre-operative assessment

## 2014-05-31 NOTE — Anesthesia Preprocedure Evaluation (Addendum)
Anesthesia Evaluation  Patient identified by MRN, date of birth, ID band Patient awake  General Assessment Comment:H/O HIV per Dr. Carlton Adam note.  Reviewed: Allergy & Precautions, H&P , NPO status , Patient's Chart, lab work & pertinent test results  Airway Mallampati: II  TM Distance: >3 FB Neck ROM: Full    Dental no notable dental hx.    Pulmonary asthma , COPD COPD inhaler, Current Smoker,  breath sounds clear to auscultation  Pulmonary exam normal       Cardiovascular hypertension, Pt. on medications Rhythm:Regular Rate:Normal     Neuro/Psych  Headaches, negative psych ROS   GI/Hepatic negative GI ROS, Neg liver ROS,   Endo/Other  negative endocrine ROS  Renal/GU negative Renal ROS     Musculoskeletal negative musculoskeletal ROS (+)   Abdominal   Peds  Hematology negative hematology ROS (+)   Anesthesia Other Findings   Reproductive/Obstetrics negative OB ROS                            Anesthesia Physical Anesthesia Plan  ASA: III  Anesthesia Plan: General   Post-op Pain Management:    Induction: Intravenous  Airway Management Planned:   Additional Equipment:   Intra-op Plan:   Post-operative Plan: Extubation in OR  Informed Consent: I have reviewed the patients History and Physical, chart, labs and discussed the procedure including the risks, benefits and alternatives for the proposed anesthesia with the patient or authorized representative who has indicated his/her understanding and acceptance.   Dental advisory given  Plan Discussed with: CRNA  Anesthesia Plan Comments:         Anesthesia Quick Evaluation

## 2014-05-31 NOTE — Transfer of Care (Signed)
Immediate Anesthesia Transfer of Care Note  Patient: [name]  Procedure(s) Performed: Procedure(s) (LRB): BLADDER BIOPSY WITH FULGERATION BILATERAL RETROGRADE PYLOGRAM (Bilateral)  Patient Location: PACU  Anesthesia Type: General  Level of Consciousness: awake, sedated, patient cooperative and responds to stimulation  Airway & Oxygen Therapy: Patient Spontanous Breathing and Patient connected to face mask oxygen  Post-op Assessment: Report given to PACU RN, Post -op Vital signs reviewed and stable and Patient moving all extremities  Post vital signs: Reviewed and stable  Complications: No apparent anesthesia complications

## 2014-05-31 NOTE — Op Note (Signed)
Preoperative diagnosis:  1. History of bladder cancer with suspicious areas within prostatic urethra and trigone   Postoperative diagnosis:  1. same   Procedure: 1. Bilateral retrograde pyelogram with interpretation 2. Bladder biopsy with fulgaration  Surgeon: Ardis Hughs, MD  Anesthesia: General  Complications: None  Intraoperative findings: bilateral retrograde pyelograms demonstrated a normal caliber ureter with no evidence of filling defects within the collecting system bilaterally. The trigonal region of the bladder as well as the prostatic urethra had some erythema and a flat appearing lesion consistent with CIS or inflammation..  EBL: Minimal  Specimens:  #1 right trigonal region bladder biopsy #2 prostatic urethral biopsy #3 left trigonal region bladder biopsy  Indication: [name] is a 58 y.o. patient with history of transitional cell carcinoma with recurrent CIS. The patient presented to me   A to establish care as is initial treatment had been down in [facility]. After reviewing the node was time for him to undergo surveillance cystoscopy. His cystoscopy did show areas within the trigone and prostatic urethra that were concerning for CIS. However, his cytology was normal. After reviewing the management options for treatment, he elected to proceed with the above surgical procedure(s). We have discussed the potential benefits and risks of the procedure, side effects of the proposed treatment, the likelihood of the patient achieving the goals of the procedure, and any potential problems that might occur during the procedure or recuperation. Informed consent has been obtained.  Description of procedure:  The patient was taken to the operating room and general anesthesia was induced.  The patient was placed in the dorsal lithotomy position, prepped and draped in the usual sterile fashion, and preoperative antibiotics were administered. A preoperative  time-out was performed.   A 22 French 12.5 cystoscope was then gently passed to the patient's urethra and into the bladder under visual guidance. The 12.5 lens was exchanged for the 70 lens and a 360 cystoscopic evaluation was performed. The orthotopic ureters were noted to reflux clear yellow urine. There were no large papillary lesions, there was erythematous patches along the trigone and within the prostatic urethra. The prostatic urethra was noted to be cored out consistent with the patient's history of prostate laser ablation. The 70 lens was then re-exchanged for the 12.5 lens and bilateral retrograde pyelogram's were performed in the routine fashion, the above findings were noted. I then used the cold cup biopsy forceps were then used to biopsy the patient's bladder. 2 biopsies were taken from the right trigonal region, 2 biopsies were taken from the prostatic urethra, and 2 biopsies were taken from the left trigonal region. The tissue samples were sent separately, and the biopsy areas were then copiously fulgurated using a Bugbee cautery. Any additional erythematous regions within these areas were also fulgurated. Once all the erythematous patches had been either biopsied or fulgurated the bladder was emptied and the cystoscope was removed.  10 mL of 1% lidocaine jelly was then injected in this patient's urethra. A B&O suppository was placed in the patient's rectum. The patient was subsequently extubated and returned to PACU next condition. Ardis Hughs, M.D.

## 2014-05-31 NOTE — Discharge Instructions (Signed)
Transurethral Resection of Bladder Tumor (TURBT) or Bladder Biopsy   Definition:  Transurethral Resection of the Bladder Tumor is a surgical procedure used to diagnose and remove tumors within the bladder. TURBT is the most common treatment for early stage bladder cancer.  General instructions:     Your recent bladder surgery requires very little post hospital care but some definite precautions.  Despite the fact that no skin incisions were used, the area around the bladder incisions are raw and covered with scabs to promote healing and prevent bleeding. Certain precautions are needed to insure that the scabs are not disturbed over the next 2-4 weeks while the healing proceeds.  Because the raw surface inside your bladder and the irritating effects of urine you may expect frequency of urination and/or urgency (a stronger desire to urinate) and perhaps even getting up at night more often. This will usually resolve or improve slowly over the healing period. You may see some blood in your urine over the first 6 weeks. Do not be alarmed, even if the urine was clear for a while. Get off your feet and drink lots of fluids until clearing occurs. If you start to pass clots or don't improve call us.  Diet:  You may return to your normal diet immediately. Because of the raw surface of your bladder, alcohol, spicy foods, foods high in acid and drinks with caffeine may cause irritation or frequency and should be used in moderation. To keep your urine flowing freely and avoid constipation, drink plenty of fluids during the day (8-10 glasses). Tip: Avoid cranberry juice because it is very acidic.  Activity:  Your physical activity doesn't need to be restricted. However, if you are very active, you may see some blood in the urine. We suggest that you reduce your activity under the circumstances until the bleeding has stopped.  Bowels:  It is important to keep your bowels regular during the postoperative  period. Straining with bowel movements can cause bleeding. A bowel movement every other day is reasonable. Use a mild laxative if needed, such as milk of magnesia 2-3 tablespoons, or 2 Dulcolax tablets. Call if you continue to have problems. If you had been taking narcotics for pain, before, during or after your surgery, you may be constipated. Take a laxative if necessary.    Medication:  You should resume your pre-surgery medications unless told not to. In addition you may be given an antibiotic to prevent or treat infection. Antibiotics are not always necessary. All medication should be taken as prescribed until the bottles are finished unless you are having an unusual reaction to one of the drugs.      Post Anesthesia Home Care Instructions  Activity: Get plenty of rest for the remainder of the day. A responsible adult should stay with you for 24 hours following the procedure.  For the next 24 hours, DO NOT: -Drive a car -Operate machinery -Drink alcoholic beverages -Take any medication unless instructed by your physician -Make any legal decisions or sign important papers.  Meals: Start with liquid foods such as gelatin or soup. Progress to regular foods as tolerated. Avoid greasy, spicy, heavy foods. If nausea and/or vomiting occur, drink only clear liquids until the nausea and/or vomiting subsides. Call your physician if vomiting continues.  Special Instructions/Symptoms: Your throat may feel dry or sore from the anesthesia or the breathing tube placed in your throat during surgery. If this causes discomfort, gargle with warm salt water. The discomfort should disappear within 24 

## 2014-05-31 NOTE — Interval H&P Note (Signed)
History and Physical Interval Note:  05/31/2014 12:40 PM  [name]  has presented today for surgery, with the diagnosis of BLADDER CANCER,PROSTATIC URETHRAL CANCER  The various methods of treatment have been discussed with the patient and family. After consideration of risks, benefits and other options for treatment, the patient has consented to  Procedure(s): [place] (Bilateral) as a surgical intervention .  The patient's history has been reviewed, patient examined, no change in status, stable for surgery.  I have reviewed the patient's chart and labs.  Questions were answered to the patient's satisfaction.     Louis Meckel W

## 2014-05-31 NOTE — Anesthesia Postprocedure Evaluation (Signed)
  Anesthesia Post-op Note  Patient: [name]  Procedure(s) Performed: Procedure(s) (LRB): BLADDER BIOPSY WITH FULGERATION BILATERAL RETROGRADE PYLOGRAM (Bilateral)  Patient Location: PACU  Anesthesia Type: General  Level of Consciousness: awake and alert   Airway and Oxygen Therapy: Patient Spontanous Breathing  Post-op Pain: mild  Post-op Assessment: Post-op Vital signs reviewed, Patient's Cardiovascular Status Stable, Respiratory Function Stable, Patent Airway and No signs of Nausea or vomiting  Last Vitals:  Filed Vitals:   05/31/14 1521  BP: 160/60  Pulse: 59  Temp: 36.6 C  Resp: 18    Post-op Vital Signs: stable   Complications: No apparent anesthesia complications

## 2014-06-01 ENCOUNTER — Encounter (HOSPITAL_BASED_OUTPATIENT_CLINIC_OR_DEPARTMENT_OTHER): Payer: Self-pay | Admitting: Urology

## 2014-10-05 DIAGNOSIS — C679 Malignant neoplasm of bladder, unspecified: Secondary | ICD-10-CM | POA: Diagnosis not present

## 2014-10-05 DIAGNOSIS — R8290 Unspecified abnormal findings in urine: Secondary | ICD-10-CM | POA: Diagnosis not present

## 2015-02-23 ENCOUNTER — Telehealth: Payer: Self-pay

## 2015-02-23 NOTE — Telephone Encounter (Signed)
11:00 am  Patient walked into clinic requesting appointment for medication refills.  He signed medical release form in June, 2016 and wanted to check on status.  We had not received records and request was resubmitted via fax. He states he has Medicaid and is almost of his HIV medication which he does not know the name of.  I will call the patient for appointment once medical records are received and schedule intake and send script to pharmacy  at that time.   1:30 Message left on voice mail- medical records received.  Call for appointment .   Laverle Patter , RN

## 2015-02-26 ENCOUNTER — Telehealth: Payer: Self-pay

## 2015-02-26 DIAGNOSIS — B20 Human immunodeficiency virus [HIV] disease: Secondary | ICD-10-CM

## 2015-02-26 MED ORDER — EMTRICITAB-RILPIVIR-TENOFOV DF 200-25-300 MG PO TABS: 1.0000 | ORAL_TABLET | Freq: Every day | ORAL | Status: DC

## 2015-02-26 NOTE — Telephone Encounter (Signed)
Patient calling to see if medical records were received. He will need a refill of Complera prior to intake visit.  I have verified medication  in medical records and will call one month supply to pharmacy.   [facility] .   Laverle Patter, RN

## 2015-03-20 ENCOUNTER — Other Ambulatory Visit: Payer: Self-pay

## 2015-03-20 ENCOUNTER — Other Ambulatory Visit (HOSPITAL_COMMUNITY)
Admission: RE | Admit: 2015-03-20 | Discharge: 2015-03-20 | Disposition: A | Discharge status: Home / Self Care | Payer: Medicare Other | Source: Ambulatory Visit | Attending: Internal Medicine | Admitting: Internal Medicine

## 2015-03-20 ENCOUNTER — Ambulatory Visit: Payer: Medicare Other

## 2015-03-20 DIAGNOSIS — Z113 Encounter for screening for infections with a predominantly sexual mode of transmission: Secondary | ICD-10-CM | POA: Insufficient documentation

## 2015-03-20 DIAGNOSIS — I10 Essential (primary) hypertension: Secondary | ICD-10-CM

## 2015-03-20 DIAGNOSIS — B20 Human immunodeficiency virus [HIV] disease: Secondary | ICD-10-CM

## 2015-03-20 DIAGNOSIS — E7800 Pure hypercholesterolemia, unspecified: Secondary | ICD-10-CM

## 2015-03-20 DIAGNOSIS — J4521 Mild intermittent asthma with (acute) exacerbation: Secondary | ICD-10-CM

## 2015-03-20 DIAGNOSIS — C679 Malignant neoplasm of bladder, unspecified: Secondary | ICD-10-CM

## 2015-03-20 DIAGNOSIS — C675 Malignant neoplasm of bladder neck: Secondary | ICD-10-CM

## 2015-03-20 LAB — COMPLETE METABOLIC PANEL WITH GFR
ALT: 11 U/L (ref 9–46)
AST: 38 U/L — ABNORMAL HIGH (ref 10–35)
Albumin: 3.7 g/dL (ref 3.6–5.1)
Alkaline Phosphatase: 115 U/L (ref 40–115)
BUN: 9 mg/dL (ref 7–25)
CO2: 23 mmol/L (ref 20–31)
Calcium: 9.6 mg/dL (ref 8.6–10.3)
Chloride: 98 mmol/L (ref 98–110)
Creat: 0.93 mg/dL (ref 0.70–1.25)
GFR, Est African American: >89 mL/min (ref >=60)
GFR, Est Non African American: 88 mL/min (ref >=60)
Glucose, Bld: 84 mg/dL (ref 65–99)
Potassium: 4.9 mmol/L (ref 3.5–5.3)
Sodium: 132 mmol/L — ABNORMAL LOW (ref 135–146)
TOTAL PROTEIN: 9.3 g/dL — AB (ref 6.1–8.1)
Total Bilirubin: 0.4 mg/dL (ref 0.2–1.2)

## 2015-03-20 LAB — CBC WITH DIFFERENTIAL/PLATELET
Basophils Absolute: 0 10*3/uL (ref 0.0–0.1)
Basophils Relative: 0 % (ref 0–1)
EOS ABS: 0.1 10*3/uL (ref 0.0–0.7)
EOS PCT: 3 % (ref 0–5)
HCT: 39.4 % (ref 39.0–52.0)
Hemoglobin: 13.5 g/dL (ref 13.0–17.0)
LYMPHS ABS: 1.7 10*3/uL (ref 0.7–4.0)
Lymphocytes Relative: 48 % — ABNORMAL HIGH (ref 12–46)
MCH: 33.2 pg (ref 26.0–34.0)
MCHC: 34.3 g/dL (ref 30.0–36.0)
MCV: 96.8 fL (ref 78.0–100.0)
MONOS PCT: 16 % — AB (ref 3–12)
MPV: 9.3 fL (ref 8.6–12.4)
Monocytes Absolute: 0.6 10*3/uL (ref 0.1–1.0)
Neutro Abs: 1.2 10*3/uL — ABNORMAL LOW (ref 1.7–7.7)
Neutrophils Relative %: 33 % — ABNORMAL LOW (ref 43–77)
PLATELETS: 218 10*3/uL (ref 150–400)
RBC: 4.07 MIL/uL — ABNORMAL LOW (ref 4.22–5.81)
RDW: 14.8 % (ref 11.5–15.5)
WBC: 3.6 10*3/uL — ABNORMAL LOW (ref 4.0–10.5)

## 2015-03-20 LAB — LIPID PANEL
CHOL/HDL RATIO: 1.8 ratio (ref <=5.0)
Cholesterol: 124 mg/dL — ABNORMAL LOW (ref 125–200)
HDL: 70 mg/dL (ref >=40)
LDL CALC: 44 mg/dL (ref <130)
Triglycerides: 48 mg/dL (ref <150)
VLDL: 10 mg/dL (ref <30)

## 2015-03-20 LAB — RPR

## 2015-03-20 MED ORDER — ALBUTEROL SULFATE (2.5 MG/3ML) 0.083% IN NEBU: 2.5000 mg | INHALATION_SOLUTION | Freq: Four times a day (QID) | RESPIRATORY_TRACT | Status: DC | PRN

## 2015-03-20 MED ORDER — AMLODIPINE BESYLATE 5 MG PO TABS: 5.0000 mg | ORAL_TABLET | Freq: Every day | ORAL | Status: DC

## 2015-03-20 MED ORDER — ALBUTEROL SULFATE HFA 108 (90 BASE) MCG/ACT IN AERS: 2.0000 | INHALATION_SPRAY | Freq: Four times a day (QID) | RESPIRATORY_TRACT | Status: DC | PRN

## 2015-03-20 NOTE — Telephone Encounter (Signed)
Patient here for intake and in need of medications.   Laverle Patter, RN

## 2015-03-21 LAB — URINALYSIS
BILIRUBIN URINE: NEGATIVE
Glucose, UA: NEGATIVE
Nitrite: POSITIVE — AB
SPECIFIC GRAVITY, URINE: 1.019 (ref 1.001–1.035)
pH: 5.5 (ref 5.0–8.0)

## 2015-03-21 LAB — HIV-1 RNA ULTRAQUANT REFLEX TO GENTYP+
HIV 1 RNA QUANT: 8903 {copies}/mL — AB (ref <20)
HIV-1 RNA QUANT, LOG: 3.95 {Log} — AB (ref <1.30)

## 2015-03-21 LAB — URINE CYTOLOGY ANCILLARY ONLY
Chlamydia: NEGATIVE
Neisseria Gonorrhea: NEGATIVE

## 2015-03-21 LAB — HEPATITIS B SURFACE ANTIGEN: HEP B S AG: NEGATIVE

## 2015-03-21 LAB — HEPATITIS B CORE ANTIBODY, TOTAL: Hep B Core Total Ab: NONREACTIVE

## 2015-03-21 LAB — HEPATITIS B SURFACE ANTIBODY,QUALITATIVE: Hep B S Ab: NEGATIVE

## 2015-03-21 LAB — HEPATITIS A ANTIBODY, TOTAL: HEP A TOTAL AB: REACTIVE — AB

## 2015-03-21 LAB — HEPATITIS C ANTIBODY: HCV Ab: NEGATIVE

## 2015-03-22 LAB — QUANTIFERON TB GOLD ASSAY (BLOOD)
Interferon Gamma Release Assay: POSITIVE — AB
MITOGEN VALUE: 8.31 [IU]/mL
QUANTIFERON NIL VALUE: 0.07 [IU]/mL
QUANTIFERON TB AG MINUS NIL: 0.36 [IU]/mL
TB Ag value: 0.43 IU/mL

## 2015-03-22 LAB — T-HELPER CELL (CD4) - (RCID CLINIC ONLY)
CD4 T CELL HELPER: 37 % (ref 33–55)
CD4 T Cell Abs: 620 /uL (ref 400–2700)

## 2015-03-29 LAB — HLA B*5701: HLA-B*5701 w/rflx HLA-B High: NEGATIVE

## 2015-03-29 LAB — HIV-1 GENOTYPR PLUS

## 2015-03-30 DIAGNOSIS — J45909 Unspecified asthma, uncomplicated: Secondary | ICD-10-CM | POA: Insufficient documentation

## 2015-03-30 DIAGNOSIS — C679 Malignant neoplasm of bladder, unspecified: Secondary | ICD-10-CM | POA: Insufficient documentation

## 2015-03-30 DIAGNOSIS — I10 Essential (primary) hypertension: Secondary | ICD-10-CM | POA: Insufficient documentation

## 2015-03-30 NOTE — Progress Notes (Signed)
Patient has transferred care from [place], [place] after moving to [place] to be closer to his children. He has been dealing with bladder cancer for the last two years and is currently in care locally with Dr. Louis Meckel . He is having symptoms of blood in his urine with some bladder pressure.  I have advised him to call Dr Louis Meckel for a follow up visit.  He is disabled due to a on the job back injury in the 90's. He uses a C PAP at night and has intermittent problems with asthma.   Patient will need assistance from [employer] with Housing, transportation and problems he is having with social security.  I was able to connect him with [facility] today and Vito Backers has enrolled him for services.  Patient has low literacy level but is a very good listener and retains information well. Although I do have some concern about his understanding of his recurrent cancer status.  He will need a referral for primary care . Medication refills sent to pharmacy today. He reports 100% adherence with Complera . He is unsure of how he contracted HIV and thinks it may have been form blood transfusions in the early 80's. He has been married twice and both ex wives have tested HIV negative. He was never tested for HIV prior to his bladder cancer surgery in 2014.  He has not shared his HIV diagnosis with his children from fear of "letting them down and "being treated differently with the grandchildren". 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   No tattoos or piercings Medical records received. Vaccines up to date.   Laverle Patter, RN

## 2015-03-31 ENCOUNTER — Other Ambulatory Visit: Payer: Self-pay | Admitting: Infectious Diseases

## 2015-03-31 DIAGNOSIS — B20 Human immunodeficiency virus [HIV] disease: Secondary | ICD-10-CM

## 2015-04-18 ENCOUNTER — Encounter: Payer: Self-pay | Admitting: Internal Medicine

## 2015-04-18 DIAGNOSIS — B20 Human immunodeficiency virus [HIV] disease: Secondary | ICD-10-CM | POA: Insufficient documentation

## 2015-04-18 DIAGNOSIS — Z227 Latent tuberculosis: Secondary | ICD-10-CM | POA: Insufficient documentation

## 2015-04-19 ENCOUNTER — Encounter: Payer: Self-pay | Admitting: Internal Medicine

## 2015-04-19 ENCOUNTER — Ambulatory Visit (INDEPENDENT_AMBULATORY_CARE_PROVIDER_SITE_OTHER): Payer: Medicare Other | Admitting: Internal Medicine

## 2015-04-19 ENCOUNTER — Ambulatory Visit: Payer: Medicare Other | Admitting: *Deleted

## 2015-04-19 VITALS — BP 168/83 | HR 55 | Temp 97.7°F | Resp 14 | Wt 133.1 lb

## 2015-04-19 DIAGNOSIS — F329 Major depressive disorder, single episode, unspecified: Secondary | ICD-10-CM

## 2015-04-19 DIAGNOSIS — N3000 Acute cystitis without hematuria: Secondary | ICD-10-CM | POA: Diagnosis not present

## 2015-04-19 DIAGNOSIS — B20 Human immunodeficiency virus [HIV] disease: Secondary | ICD-10-CM

## 2015-04-19 DIAGNOSIS — C670 Malignant neoplasm of trigone of bladder: Secondary | ICD-10-CM | POA: Diagnosis not present

## 2015-04-19 DIAGNOSIS — Z227 Latent tuberculosis: Secondary | ICD-10-CM

## 2015-04-19 DIAGNOSIS — F32A Depression, unspecified: Secondary | ICD-10-CM

## 2015-04-19 DIAGNOSIS — R799 Abnormal finding of blood chemistry, unspecified: Secondary | ICD-10-CM | POA: Diagnosis not present

## 2015-04-19 DIAGNOSIS — R300 Dysuria: Secondary | ICD-10-CM | POA: Diagnosis not present

## 2015-04-19 DIAGNOSIS — C679 Malignant neoplasm of bladder, unspecified: Secondary | ICD-10-CM | POA: Diagnosis not present

## 2015-04-19 NOTE — Progress Notes (Signed)
Patient ID: [name], male   DOB: [birth year], 59 y.o.   MRN: [id number] HPI: [name] is a 59 y.o. male who was recently moved and is here for care.   Allergies: Allergies  Allergen Reactions  . Aspirin Shortness Of Breath, Nausea And Vomiting and Swelling    Vitals: Temp: 97.7 F (36.5 C) (09/15 0953) Temp Source: Oral (09/15 0953) BP: 168/83 mmHg (09/15 0953) Pulse Rate: 55 (09/15 0953)  Past Medical History: Past Medical History  Diagnosis Date  . Hypertension   . Migraine   . Nocturia   . Wears glasses   . Wears dentures     UPPER  . Bladder cancer     and prostatic urethra cancer  HX TURBT'S IN [place], [place]  WITH INSTILLATION CHEMO TX'S  . Asthma with COPD   . History of gastric ulcer   . History of left hip replacement 2012  . Cataracts, bilateral 2014    Social History: Social History   Social History  . Marital Status: Legally Separated    Spouse Name: N/A  . Number of Children: N/A  . Years of Education: N/A   Social History Main Topics  . Smoking status: Current Every Day Smoker -- 0.50 packs/day for 17 years    Types: Cigarettes  . Smokeless tobacco: Never Used  . Alcohol Use: 2.4 oz/week    4 Cans of beer per week     Comment: occasional  . Drug Use: No  . Sexual Activity:    Partners: Female    Patent examiner Protection: Condom   Other Topics Concern  . None   Social History Narrative    Previous Regimen: Complera  Current Regimen:  Complera  Labs: HIV 1 RNA QUANT (copies/mL)  Date Value  03/20/2015 8903*   CD4 T CELL ABS (/uL)  Date Value  03/20/2015 620   HEP B S AB (no units)  Date Value  03/20/2015 NEG   HEPATITIS B SURFACE AG (no units)  Date Value  03/20/2015 NEGATIVE   HCV AB (no units)  Date Value  03/20/2015 NEGATIVE    CrCl: CrCl cannot be calculated (Unknown ideal weight.).  Lipids:    Component Value Date/Time   CHOL 124* 03/20/2015 1126   TRIG 48 03/20/2015 1126   HDL 70 03/20/2015 1126   CHOLHDL 1.8 03/20/2015 1126   VLDL 10 03/20/2015 1126   LDLCALC 44 03/20/2015 1126    Assessment: 59 yo who just moved here and is here to establish care for his HIV. After reviewing his paper chart, it looks like Complera was the only thing he has been on. He was off of it for a little while due to supply. He stated that he has medicaid. We are planning on restarting him back on Complera.   Recommendations:  Complera 1 PO qday  Wilfred Lacy, PharmD Clinical Infectious [place] [facility] 04/19/2015, 10:57 AM

## 2015-04-19 NOTE — Assessment & Plan Note (Signed)
He is suffering from some depression as he struggles with his HIV infection and bladder cancer. He became concerned today when he learned that his viral load was elevated and asked if he had AIDS. I told him that he did not and that we should be able to get his HIV infection under good control in the near future. He met with our ID pharmacist today for adherence counseling and also met with Karle Plumber, our behavioral health counselor

## 2015-04-19 NOTE — Assessment & Plan Note (Signed)
His HIV viral load has reactivated. His genotype does not reveal any resistance mutations and hopefully he will suppress quickly now that he is doing a better job of taking his medications. He states that he takes his Complera each evening with his dinner meal and he has not been missing doses over the past month. I will repeat his viral load today and see him back within one month.

## 2015-04-19 NOTE — BH Specialist Note (Signed)
Counselor was approached by Dr. Megan Salon to meet with Fritz Pickerel for possible depression symptoms related to his serious health problems.  Client was oriented times four with good affect and dress.  Client was alert and talkative.  Client shared that he has really struggled lately with all the health issues he has and has not felt good physically.  Client stated that he does not use any substances other than an occasional beer.  Counselor discussed with client different coping strategies that can be used to help with the depression and slight anxiety he has about his health issues. Counselor provided support and encouragement for client.  Counselor educated client on withdrawing and typical behavior when faced with a potential terminal disease. Counselor explained that simple grief can turn into depression if not careful.  Counselor motivated client to recognize that it would be beneficial to allow himself to be supported by people and staff to assist him during this difficult time especially since he has little family support.  Counselor shared that a counselor can help a person with a potentially terminal illness better understand their condition, and how to come to terms with it.  Counselor further shared that this understanding can help reduce their depressive symptoms.  Counselor recommended to client that he meet on a regular basis and client agreed.  An appointment was made for two weeks out. Client was in better spirits when he left as evidenced by smiling, giving good eye contact and talking more positively.   Rolena Infante, LPCA, MA [facility]

## 2015-04-19 NOTE — Assessment & Plan Note (Signed)
I will check a UA and urine culture today. His urinary symptoms may be due to his bladder cancer but I also want to evaluate him for the possible of infectious cystitis.

## 2015-04-19 NOTE — Progress Notes (Signed)
Patient ID: [name], male   DOB: [birth year], 59 y.o.   MRN: [id number]          Patient Active Problem List   Diagnosis Date Noted  . HIV disease 04/18/2015    Priority: High  . Depression 04/19/2015  . 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 HTN (hypertension) 03/30/2015  . Asthma, chronic 03/30/2015  . Malignant neoplasm of bladder 03/30/2015    Patient's Medications  New Prescriptions   No medications on file  Previous Medications   ALBUTEROL (PROVENTIL HFA;VENTOLIN HFA) 108 (90 BASE) MCG/ACT INHALER    Inhale 2 puffs into the lungs every 6 (six) hours as needed for wheezing or shortness of breath.   ALBUTEROL (PROVENTIL) (2.5 MG/3ML) 0.083% NEBULIZER SOLUTION    Take 3 mLs (2.5 mg total) by nebulization every 6 (six) hours as needed for wheezing or shortness of breath.   AMLODIPINE (NORVASC) 5 MG TABLET    Take 1 tablet (5 mg total) by mouth daily.   COMPLERA 200-25-300 MG TABS    TAKE 1 TABLET BY MOUTH DAILY.   OXYCODONE-ACETAMINOPHEN (PERCOCET/ROXICET) 5-325 MG PER TABLET    Take 1-2 tablets by mouth every 6 (six) hours as needed for severe pain.   PHENAZOPYRIDINE (PYRIDIUM) 200 MG TABLET    Take 1 tablet (200 mg total) by mouth 3 (three) times daily as needed for pain.  Modified Medications   No medications on file  Discontinued Medications   No medications on file    Subjective: [name] is in for his first visit with [place] today. He was diagnosed with HIV infection about 5 years ago while living in [place]. He states he is not sure how he acquired HIV infection. He had lots of blood transfusions when hospitalized for multiple surgeries in the 80s. He states that he had been tested regularly and he thinks that included HIV and testing over the years but first tested +5 years ago. He says that his former wife and his children have all been tested and are negative but he also states that he has not shared the information of his HIV infection with any family or friends.  About a year after his diagnosis he entered into care and started taking Complera. Notes from his provider in [place] indicate that his viral load was undetectable at the time of his last visit there in May of last year. His CD4 count was over 600. He states that he was off of his Complera for about 9 months after moving here urine half ago. He made the move to be closer to family. He is currently living alone here. He states that he restarted his medication several months ago but that he has been under a great deal of stress and was not taking his medication consistently until about a month ago when he came in for blood work.  A lot of the stress is related to his bladder cancer diagnosis. That was also diagnosed about 5 years ago. He started on BCG bladder instillations about 3 or 4 years ago but stopped them last year. He is now under the local care of Dr. Burman Nieves. He is having more problems with hematuria, discharge. A and lower abdominal and back pain. He is due to see Dr. Louis Meckel today. He admits to being somewhat depressed.  Review of Systems: Constitutional: positive for anorexia, fevers, malaise and weight loss, negative for chills and sweats Eyes: negative Ears, nose, mouth, throat, and face: negative Respiratory: negative Cardiovascular: negative  Gastrointestinal: negative Genitourinary:positive for dysuria, frequency and hematuria  Past Medical History  Diagnosis Date  . Hypertension   . Migraine   . Nocturia   . Wears glasses   . Wears dentures     UPPER  . Bladder cancer     and prostatic urethra cancer  HX TURBT'S IN [place], [place]  WITH INSTILLATION CHEMO TX'S  . Asthma with COPD   . History of gastric ulcer   . History of left hip replacement 2012  . Cataracts, bilateral 2014    Social History  Substance Use Topics  . Smoking status: Current Every Day Smoker -- 0.50 packs/day for 17 years    Types: Cigarettes  . Smokeless tobacco: Never Used  . Alcohol Use:  2.4 oz/week    4 Cans of beer per week     Comment: occasional    Family History  Problem Relation Age of Onset  . Cancer Mother     Gastric Cancer  . Hypertension Mother   . Heart disease Mother   . Cancer Maternal Grandmother     breast cancer     Allergies  Allergen Reactions  . Aspirin Shortness Of Breath, Nausea And Vomiting and Swelling    Objective:  Filed Vitals:   04/19/15 0953  BP: 168/83  Pulse: 55  Temp: 97.7 F (36.5 C)  TempSrc: Oral  Resp: 14  Weight: 133 lb 1.9 oz (60.383 kg)   Body mass index is 22.84 kg/(m^2).  General: He is quiet and in no distress Oral: No oropharyngeal lesions. Some missing teeth Skin: No rash Lungs: Clear Cor: Regular S1 and S2 with no murmurs Abdomen: Soft with some lower abdominal discomfort with palpation Joints and extremities: No acute abnormalities Neuro: Alert with normal speech and conversation Mood: He does not appear anxious. He was tearful during parts of the exam  Lab Results Lab Results  Component Value Date   WBC 3.6* 03/20/2015   HGB 13.5 03/20/2015   HCT 39.4 03/20/2015   MCV 96.8 03/20/2015   PLT 218 03/20/2015    Lab Results  Component Value Date   CREATININE 0.93 03/20/2015   BUN 9 03/20/2015   NA 132* 03/20/2015   K 4.9 03/20/2015   CL 98 03/20/2015   CO2 23 03/20/2015    Lab Results  Component Value Date   ALT 11 03/20/2015   AST 38* 03/20/2015   ALKPHOS 115 03/20/2015   BILITOT 0.4 03/20/2015    Lab Results  Component Value Date   CHOL 124* 03/20/2015   HDL 70 03/20/2015   LDLCALC 44 03/20/2015   TRIG 48 03/20/2015   CHOLHDL 1.8 03/20/2015    Lab Results HIV 1 RNA QUANT (copies/mL)  Date Value  03/20/2015 8903*   CD4 T CELL ABS (/uL)  Date Value  03/20/2015 620     Problem List Items Addressed This Visit      High   HIV disease - Primary    His HIV viral load has reactivated. His genotype does not reveal any resistance mutations and hopefully he will suppress  quickly now that he is doing a better job of taking his medications. He states that he takes his Complera each evening with his dinner meal and he has not been missing doses over the past month. I will repeat his viral load today and see him back within one month.      Relevant Orders   HIV 1 RNA quant-no reflex-bld     Unprioritized  Depression   Latent tuberculosis by blood test   Malignant neoplasm of bladder    I will check a UA and urine culture today. His urinary symptoms may be due to his bladder cancer but I also want to evaluate him for the possible of infectious cystitis.      Relevant Orders   Urinalysis, Routine w reflex microscopic   Urine Culture    Other Visit Diagnoses    Dysuria        Relevant Orders    Urine Culture         Michel Bickers, MD [place] [facility] [phone] pager   [phone] cell 04/19/2015, 10:58 AM

## 2015-04-20 LAB — HIV-1 RNA QUANT-NO REFLEX-BLD
HIV 1 RNA Quant: 172 copies/mL — ABNORMAL HIGH (ref <20)
HIV-1 RNA Quant, Log: 2.24 {Log} — ABNORMAL HIGH (ref <1.30)

## 2015-04-20 LAB — URINE CULTURE
Colony Count: NO GROWTH
ORGANISM ID, BACTERIA: NO GROWTH

## 2015-04-20 LAB — URINALYSIS, MICROSCOPIC ONLY
BACTERIA UA: NONE SEEN [HPF]
Casts: NONE SEEN [LPF]
Crystals: NONE SEEN [HPF]
RBC / HPF: >60 RBC/HPF — AB (ref <=2)
Yeast: NONE SEEN [HPF]

## 2015-04-20 LAB — URINALYSIS, ROUTINE W REFLEX MICROSCOPIC
Bilirubin Urine: NEGATIVE
Glucose, UA: NEGATIVE
KETONES UR: NEGATIVE
Nitrite: NEGATIVE
PH: 6 (ref 5.0–8.0)
SPECIFIC GRAVITY, URINE: 1.013 (ref 1.001–1.035)

## 2015-05-01 ENCOUNTER — Telehealth: Payer: Self-pay | Admitting: *Deleted

## 2015-05-01 NOTE — Telephone Encounter (Signed)
RN received a referral from Ruby Manson/[name] to offer services to the patient. RN contacted the patient today and arranged a warm hand off with the patient on 05/03/15 before his appt with Leveda Anna at the [facility].

## 2015-05-03 ENCOUNTER — Encounter: Payer: Self-pay | Admitting: Pharmacist

## 2015-05-03 ENCOUNTER — Ambulatory Visit: Payer: Medicare Other | Admitting: *Deleted

## 2015-05-03 VITALS — BP 161/77 | HR 64 | Temp 98.2°F | Resp 18

## 2015-05-03 DIAGNOSIS — F32A Depression, unspecified: Secondary | ICD-10-CM

## 2015-05-03 DIAGNOSIS — B20 Human immunodeficiency virus [HIV] disease: Secondary | ICD-10-CM

## 2015-05-03 DIAGNOSIS — F411 Generalized anxiety disorder: Secondary | ICD-10-CM

## 2015-05-03 DIAGNOSIS — F329 Major depressive disorder, single episode, unspecified: Secondary | ICD-10-CM

## 2015-05-03 NOTE — Progress Notes (Signed)
  [name] - Pharmacist    HPI: [name] is a 59 y.o. male.  I was asked by [name] to see him today for medication intolerances.  Allergies: Allergies  Allergen Reactions  . Aspirin Shortness Of Breath, Nausea And Vomiting and Swelling    Vitals: Temp: 98.2 F (36.8 C) (09/29 1123) Temp Source: Oral (09/29 1123) BP: 161/77 mmHg (09/29 1123) Pulse Rate: 64 (09/29 1123)  Past Medical History: Past Medical History  Diagnosis Date  . Hypertension   . Migraine   . Nocturia   . Wears glasses   . Wears dentures     UPPER  . Bladder cancer     and prostatic urethra cancer  HX TURBT'S IN [place], [place]  WITH INSTILLATION CHEMO TX'S  . Asthma with COPD   . History of gastric ulcer   . History of left hip replacement 2012  . Cataracts, bilateral 2014    Social History: Social History   Social History  . Marital Status: Legally Separated    Spouse Name: N/A  . Number of Children: N/A  . Years of Education: N/A   Social History Main Topics  . Smoking status: Current Every Day Smoker -- 0.50 packs/day for 17 years    Types: Cigarettes  . Smokeless tobacco: Never Used  . Alcohol Use: 2.4 oz/week    4 Cans of beer per week     Comment: occasional  . Drug Use: No  . Sexual Activity:    Partners: Female    Patent examiner Protection: Condom   Other Topics Concern  . Not on file   Social History Narrative     Current Regimen: Complera  Labs: HIV 1 RNA QUANT (copies/mL)  Date Value  04/19/2015 172*  03/20/2015 8903*   CD4 T CELL ABS (/uL)  Date Value  03/20/2015 620   HEP B S AB (no units)  Date Value  03/20/2015 NEG   HEPATITIS B SURFACE AG (no units)  Date Value  03/20/2015 NEGATIVE   HCV AB (no units)  Date Value  03/20/2015 NEGATIVE    CrCl: CrCl cannot be calculated (Unknown ideal weight.).  Lipids:    Component Value Date/Time   CHOL 124* 03/20/2015 1126   TRIG 48 03/20/2015 1126   HDL 70  03/20/2015 1126   CHOLHDL 1.8 03/20/2015 1126   VLDL 10 03/20/2015 1126   LDLCALC 44 03/20/2015 1126    Assessment: Suboptimal medication adherence - [name] tells me he only takes him Complera every other day or so due to GI intolerance.  He also does not eat consistently before taking his Complera.  He is a recent transfer to [place].  I reviewed his records sent from [facility] and they are limited.  It appears the only antiretroviral regimen he has received is Complera.  Recommendations: Would consider changing to an alternative antiretroviral regimen - I think Genvoya may be a good choice for him.  My colleague, [name], conducted an extensive review of his records and visited with [name] at his last office visit.  I will ask [name] to review his case before finalizing a regimen change.  [name], Pharm.D., BCPS, AAHIVP Clinical Infectious [facility] 05/03/2015, 4:37 PM

## 2015-05-03 NOTE — BH Specialist Note (Signed)
[name] was present for his scheduled appointment today with counselor.  Client was oriented times four with good affect and dress.  Client was alert and talkative.  Client communicated that he does not use any substance except an occasional beer. Client indicated that he has had two beers in the last month.  Client stated that he does not like to use any substance now because of the medications he is on.  Client shared that he does not feel like he needs any type of substance including alcohol to make him feel a certain way.  Client stated that whatever is going to happen and no substance is going to make it better it just delays the inevitable. Counselor agreed and provided support and encouragement accordingly. Client communicated that he is still suffering from the bladder cancer and is going to his appointments like he should in order to get the necessary medications that requires. Counselor encouraged client to continue meeting together for awhile until his issues were resolved.  Client agreed and shared that he needed all the support he could get right now.   Rolena Infante, LPCA,[name] [facility]

## 2015-05-07 ENCOUNTER — Telehealth: Payer: Self-pay | Admitting: *Deleted

## 2015-05-07 ENCOUNTER — Other Ambulatory Visit: Payer: Self-pay | Admitting: Pharmacist Clinician (PhC)/ Clinical Pharmacy Specialist

## 2015-05-07 DIAGNOSIS — C679 Malignant neoplasm of bladder, unspecified: Secondary | ICD-10-CM | POA: Diagnosis not present

## 2015-05-07 DIAGNOSIS — R300 Dysuria: Secondary | ICD-10-CM | POA: Diagnosis not present

## 2015-05-07 DIAGNOSIS — C670 Malignant neoplasm of trigone of bladder: Secondary | ICD-10-CM | POA: Diagnosis not present

## 2015-05-07 MED ORDER — ELVITEG-COBIC-EMTRICIT-TENOFAF 150-150-200-10 MG PO TABS: 1.0000 | ORAL_TABLET | Freq: Every day | ORAL | Status: DC

## 2015-05-07 NOTE — Telephone Encounter (Signed)
Patient ID: [name], male   DOB: Sep 28, 1955, 59 y.o.   MRN: [id number] Order received on 04/23/2015 by Dr. Michel Bickers to evaluate patient for [facility]). RN made first initial contact with the patient on 04/25/2015. RN arranged a visit per patient request on the same day he has a appt at the Clinic which was 05/03/2015  Patient was evaluated on 05/03/2015 for [facility]NS.  Patient was consented to care at this time.    Frequency / Duration of CBHCN visits: Effective 05/03/2015: 1wk5, 2UQ3,3HLK'T for complications with disease process/progression, medication changes or concerns  [facility]HCN will assess for learning needs related to diagnosis and treatment regimen, provide education as needed, fill pill box if needed, address any barriers which may be preventing medication compliance, and communicating with care team including physician and case manager.  Individualized Plan Of Care, Certification Period of 05/02/2018 to 08/01/2015 a. Type of service(s) and care to be delivered: [facility] Nurse b. Frequency and duration of service: Effective: 05/03/2015: 1wk5, 63mo2, 3 prns for complications with disease process/progression, medication changes or concerns c. Activity restrictions: Pt may be up as tolerated and can safely ambulate without the need for a assistive device. Pt wears glasses d. Safety Measures: Standard Precautions/Infection Control e. Service Objectives and Goals:Service Objectives are to assist the pt with HIV medication regimen adherence and staying in care with the Infectious Disease Clinic by identifying barriers to care. RN will address the barriers that are identified by the patient.  Pt identified a goal of getting over his Bladder Cancer and would like to be in a relationship with a male who is also positive. pt does not feel comfortable disclosing his status. Current RN goal for this patient is to offer a lot of emotional support  and encouragement. RN would like to accompany the patient to his Clinic appts, with his permission, to ensure he has a understanding of information provided.  f. Equipment required: No additional equipment needs at this time g. Functional Limitations: Vision. Pt has corrective glasses that he wears h. Rehabilitation potential: Guarded i. Diet and Nutritional Needs: Regular Diet j. Medications and treatments: Medications have been reconciled and reviewed and are a part of EPIC electronic file k. Specific therapies if needed: RN l. Pertinent diagnoses: Hypertension, Asthma, Bladder Cancer, Depression m. Expected outcome: Guarded n.

## 2015-05-07 NOTE — Progress Notes (Signed)
[name] saw our pharmacist Sharyn Lull here last week because he was having GI issue with his Complera. This is strange since he has been on it in the past for a while. He told Sharyn Lull that he has been taking it every other day. I sure hope he didn't acquire resistance. After discussion with Dr. Megan Salon, we are going try Genvoya. He has medicaid so it'd be covered. [facility]. I advised him to call back if he has any issue with Genvoya.

## 2015-05-07 NOTE — Telephone Encounter (Signed)
I approve of this plan of care. 

## 2015-05-07 NOTE — Patient Instructions (Signed)
RN met with client and or caregiver for assessment. RN reviewed Transport planner, [place], Home Safety Management Information Booklet. Home Fire Safety Assessment, Fall Risk Assessment and Suicide Risk Assessment was performed. RN also discussed information on a Living Will, Advanced Directives, and [place]. RN and Client/Designated Party educated/reviewed/signed Client Agreement and Consent for Service form along with Patient Rights and Responsibilities statement. RN developed patient specific and centered care plan. RN provided contact information and reviewed how to receive emergency help after hours for schedule changes, billing questions, reporting of safety issues, falls, concerns or any needs/questions. Standard Precaution and Infection control along with interventions to correct or prevent high risk behaviors instructed to the patient. Client/Caregiver reports understanding and agreement with the above. RN meet with the patient at the clinic and had a detailed conversation with the patient about his HIV status and his reluctantly to disclose his status. Pt states his children do not know his status and he would like to keep it that way. Pt states he does not want them to feel that he is dirty. RN verbalized a understanding with the patient and offered him my support, expressing to the patient that I will honor his wishes. 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 Pt stated then he would fill comfortable telling her everything. This would help him feel supported. RN verbalized understanding and instructed the patient that for support we can go to [employer]. RN advised the patient that at [employer] all of the participants are battling his same virus and could be support for him. Pt agreed to go today. RN questioned the patient on his elevated BP. Pt stated it normally goes down when he  gets home. RN will continue to monitor the patient's BP and medication adherence

## 2015-05-07 NOTE — Progress Notes (Signed)
Patient ID: Sydney Azure, male   DOB: 06-03-1956, 59 y.o.   MRN: [id number] Order received on 04/23/2015 by Dr. Michel Bickers to evaluate patient for [facility]). RN made first initial contact with the patient on 04/25/2015. RN arranged a visit per patient request on the same day he has a appt at the Clinic which was 05/03/2015  Patient was evaluated on 05/03/2015 for [facility]NS.  Patient was consented to care at this time.    Frequency / Duration of CBHCN visits: Effective 05/03/2015: 1wk5, 4WO0,3OZY'Y for complications with disease process/progression, medication changes or concerns  [facility]HCN will assess for learning needs related to diagnosis and treatment regimen, provide education as needed, fill pill box if needed, address any barriers which may be preventing medication compliance, and communicating with care team including physician and case manager.  Individualized Plan Of Care, Certification Period of 05/02/2018 to 08/01/2015 a. Type of service(s) and care to be delivered: [facility] Nurse b. Frequency and duration of service: Effective: 05/03/2015: 1wk5, 79mo2, 3 prns for complications with disease process/progression, medication changes or concerns c. Activity restrictions: Pt may be up as tolerated and can safely ambulate without the need for a assistive device. Pt wears glasses d. Safety Measures: Standard Precautions/Infection Control e. Service Objectives and Goals:Service Objectives are to assist the pt with HIV medication regimen adherence and staying in care with the Infectious Disease Clinic by identifying barriers to care. RN will address the barriers that are identified by the patient.  Pt identified a goal of getting over his Bladder Cancer and would like to be in a relationship with a male who is also positive. pt does not feel comfortable disclosing his status. Current RN goal for this patient is to offer a lot of emotional support  and encouragement. RN would like to accompany the patient to his Clinic appts, with his permission, to ensure he has a understanding of information provided.  f. Equipment required: No additional equipment needs at this time g. Functional Limitations: Vision. Pt has corrective glasses that he wears h. Rehabilitation potential: Guarded i. Diet and Nutritional Needs: Regular Diet j. Medications and treatments: Medications have been reconciled and reviewed and are a part of EPIC electronic file k. Specific therapies if needed: RN l. Pertinent diagnoses: Hypertension, Asthma, Bladder Cancer, Depression m. Expected outcome: Guarded n.

## 2015-05-08 ENCOUNTER — Ambulatory Visit: Payer: Medicare Other | Admitting: *Deleted

## 2015-05-08 ENCOUNTER — Other Ambulatory Visit: Payer: Self-pay | Admitting: Urology

## 2015-05-08 VITALS — BP 158/68 | HR 86 | Temp 96.1°F | Resp 18

## 2015-05-08 DIAGNOSIS — C675 Malignant neoplasm of bladder neck: Secondary | ICD-10-CM

## 2015-05-08 DIAGNOSIS — B20 Human immunodeficiency virus [HIV] disease: Secondary | ICD-10-CM

## 2015-05-09 ENCOUNTER — Ambulatory Visit: Payer: Self-pay | Admitting: *Deleted

## 2015-05-09 DIAGNOSIS — B20 Human immunodeficiency virus [HIV] disease: Secondary | ICD-10-CM

## 2015-05-10 NOTE — Patient Instructions (Addendum)
YOUR PROCEDURE IS SCHEDULED ON :  05/16/15  REPORT TO [facility] MAIN ENTRANCE FOLLOW SIGNS TO EAST ELEVATOR - GO TO 3rd FLOOR CHECK IN AT 3 EAST NURSES STATION (SHORT STAY) AT:  6:30 AM  CALL THIS NUMBER IF YOU HAVE PROBLEMS THE MORNING OF SURGERY [phone]  REMEMBER:ONLY 1 PER PERSON MAY GO TO SHORT STAY WITH YOU TO GET READY THE MORNING OF YOUR SURGERY  DO NOT EAT FOOD OR DRINK LIQUIDS AFTER MIDNIGHT  TAKE THESE MEDICINES THE MORNING OF SURGERY: [place] - USE IF NEEDED  YOU MAY NOT HAVE ANY METAL ON YOUR BODY INCLUDING HAIR PINS AND PIERCING'S. DO NOT WEAR JEWELRY, MAKEUP, LOTIONS, POWDERS OR PERFUMES. DO NOT WEAR NAIL POLISH. DO NOT SHAVE 48 HRS PRIOR TO SURGERY. MEN MAY SHAVE FACE AND NECK.  DO NOT [place]. [facility] IS NOT RESPONSIBLE FOR VALUABLES.  CONTACTS, DENTURES OR PARTIALS MAY NOT BE WORN TO SURGERY. LEAVE SUITCASE IN CAR. CAN BE BROUGHT TO ROOM AFTER SURGERY.  PATIENTS DISCHARGED THE DAY OF SURGERY WILL NOT BE ALLOWED TO DRIVE HOME.  PLEASE READ OVER THE FOLLOWING INSTRUCTION SHEETS _________________________________________________________________________________                                          [facility] - PREPARING FOR SURGERY  Before surgery, you can play an important role.  Because skin is not sterile, your skin needs to be as free of germs as possible.  You can reduce the number of germs on your skin by washing with CHG (chlorahexidine gluconate) soap before surgery.  CHG is an antiseptic cleaner which kills germs and bonds with the skin to continue killing germs even after washing. Please DO NOT use if you have an allergy to CHG or antibacterial soaps.  If your skin becomes reddened/irritated stop using the CHG and inform your nurse when you arrive at Short Stay. Do not shave (including legs and underarms) for at least 48 hours prior to the first CHG shower.  You may shave your face. Please  follow these instructions carefully:   1.  Shower with CHG Soap the night before surgery and the  morning of Surgery.   2.  If you choose to wash your hair, wash your hair first as usual with your  normal  Shampoo.   3.  After you shampoo, rinse your hair and body thoroughly to remove the  shampoo.                                         4.  Use CHG as you would any other liquid soap.  You can apply chg directly  to the skin and wash . Gently wash with scrungie or clean wascloth    5.  Apply the CHG Soap to your body ONLY FROM THE NECK DOWN.   Do not use on open                           Wound or open sores. Avoid contact with eyes, ears mouth and genitals (private parts).                        Genitals (private  parts) with your normal soap.              6.  Wash thoroughly, paying special attention to the area where your surgery  will be performed.   7.  Thoroughly rinse your body with warm water from the neck down.   8.  DO NOT shower/wash with your normal soap after using and rinsing off  the CHG Soap .                9.  Pat yourself dry with a clean towel.             10.  Wear clean night clothes to bed after shower             11.  Place clean sheets on your bed the night of your first shower and do not  sleep with pets.  Day of Surgery : Do not apply any lotions/deodorants the morning of surgery.  Please wear clean clothes to the hospital/surgery center.  FAILURE TO FOLLOW THESE INSTRUCTIONS MAY RESULT IN THE CANCELLATION OF YOUR SURGERY    PATIENT SIGNATURE_________________________________  ______________________________________________________________________

## 2015-05-10 NOTE — Patient Instructions (Signed)
Please refer to progress note for details of this visit 

## 2015-05-10 NOTE — Progress Notes (Signed)
Patient ID: [name], male   DOB: 04/22/1956, 59 y.o.   MRN: [id number] RN conducted a home visit with the patient today. Pt stated he got some bad news during his appt today with the Cancer Dr. Abbott Pao stated a tumor was found in his bladder and is the reason for his discomfort and hematuria. Pt stated he received a call today stating he will have surgery on 10/12 and must be at [facility] at 6:30 in the morning. Pt stated he does not have transportation and this is concerning for him. RN contacted Counselling psychologist) who agreed to transport the patient on 10/12 at 6:30am. RN felt the need to address the pt's primary concern before addressing anything else. RN assessed the pt's level of pain and medication adherence. Pt states he has some mild discomfort that is managed with his medication. While meeting with the patient, he received a call from the [facility] to schedule his PreOp appt. The patient asked me to speak with the nurse. RN spoke with the nurse and arranged the pt's preop appt. RN instructed the patient on what he will need to bring for his appt and where he will need to be. Pt verbalized understanding of education provided. Focus of today's visit per the Care Plan is to offer assistance with removing barriers that keep the patient from adhering to his medication regimen

## 2015-05-11 ENCOUNTER — Encounter (HOSPITAL_COMMUNITY)
Admission: RE | Admit: 2015-05-11 | Discharge: 2015-05-11 | Disposition: A | Discharge status: Home / Self Care | Payer: Medicare Other | Source: Ambulatory Visit | Attending: Urology | Admitting: Urology

## 2015-05-11 ENCOUNTER — Encounter (HOSPITAL_COMMUNITY): Payer: Self-pay

## 2015-05-11 DIAGNOSIS — C679 Malignant neoplasm of bladder, unspecified: Secondary | ICD-10-CM | POA: Insufficient documentation

## 2015-05-11 DIAGNOSIS — Z01818 Encounter for other preprocedural examination: Secondary | ICD-10-CM | POA: Insufficient documentation

## 2015-05-11 HISTORY — DX: Human immunodeficiency virus (HIV) disease: B20

## 2015-05-11 HISTORY — DX: Personal history of other medical treatment: Z92.89

## 2015-05-11 LAB — CBC
HEMATOCRIT: 36.1 % — AB (ref 39.0–52.0)
Hemoglobin: 12.1 g/dL — ABNORMAL LOW (ref 13.0–17.0)
MCH: 32.6 pg (ref 26.0–34.0)
MCHC: 33.5 g/dL (ref 30.0–36.0)
MCV: 97.3 fL (ref 78.0–100.0)
Platelets: 278 10*3/uL (ref 150–400)
RBC: 3.71 MIL/uL — ABNORMAL LOW (ref 4.22–5.81)
RDW: 13.3 % (ref 11.5–15.5)
WBC: 4.1 10*3/uL (ref 4.0–10.5)

## 2015-05-11 LAB — BASIC METABOLIC PANEL
Anion gap: 6 (ref 5–15)
BUN: 12 mg/dL (ref 6–20)
CALCIUM: 9 mg/dL (ref 8.9–10.3)
CO2: 25 mmol/L (ref 22–32)
CREATININE: 1 mg/dL (ref 0.61–1.24)
Chloride: 106 mmol/L (ref 101–111)
GFR calc non Af Amer: >60 mL/min (ref >60)
Glucose, Bld: 81 mg/dL (ref 65–99)
Potassium: 4.3 mmol/L (ref 3.5–5.1)
Sodium: 137 mmol/L (ref 135–145)

## 2015-05-14 NOTE — Progress Notes (Signed)
Patient ID: [name], male   DOB: [birth year], 59 y.o.   MRN: [id number] PRN visit: RN met with the patient in her home and went over the pt's signed consent for a 2nd time to ensure the patient has a understanding of his rights and responsibilities. Pt verbalized a understanding of education provided with plans to have our first official home visit tomorrow

## 2015-05-15 NOTE — Anesthesia Preprocedure Evaluation (Addendum)
Anesthesia Evaluation  Patient identified by MRN, date of birth, ID band Patient awake  General Assessment Comment:H/O HIV per Dr. Carlton Adam note.  Reviewed: Allergy & Precautions, H&P , NPO status , Patient's Chart, lab work & pertinent test results  Airway Mallampati: II  TM Distance: >3 FB Neck ROM: Full    Dental  (+) Dental Advisory Given, Edentulous Upper, Edentulous Lower   Pulmonary asthma , COPD,  COPD inhaler, Current Smoker,    Pulmonary exam normal breath sounds clear to auscultation       Cardiovascular hypertension, Pt. on medications Normal cardiovascular exam Rhythm:Regular Rate:Normal     Neuro/Psych  Headaches, negative psych ROS   GI/Hepatic negative GI ROS, Neg liver ROS,   Endo/Other  negative endocrine ROS  Renal/GU negative Renal ROS     Musculoskeletal negative musculoskeletal ROS (+)   Abdominal   Peds  Hematology negative hematology ROS (+) HIV,   Anesthesia Other Findings   Reproductive/Obstetrics negative OB ROS                           Anesthesia Physical Anesthesia Plan  ASA: III  Anesthesia Plan: General   Post-op Pain Management:    Induction: Intravenous  Airway Management Planned: LMA  Additional Equipment:   Intra-op Plan:   Post-operative Plan:   Informed Consent:   Plan Discussed with: Surgeon  Anesthesia Plan Comments:        Anesthesia Quick Evaluation

## 2015-05-16 ENCOUNTER — Encounter (HOSPITAL_COMMUNITY)
Admission: RE | Disposition: A | Discharge status: Home / Self Care | Payer: Self-pay | Source: Ambulatory Visit | Attending: Urology

## 2015-05-16 ENCOUNTER — Ambulatory Visit (HOSPITAL_COMMUNITY): Payer: Medicare Other | Admitting: Anesthesiology

## 2015-05-16 ENCOUNTER — Ambulatory Visit (HOSPITAL_COMMUNITY)
Admission: RE | Admit: 2015-05-16 | Discharge: 2015-05-16 | Disposition: A | Discharge status: Home / Self Care | Payer: Medicare Other | Source: Ambulatory Visit | Attending: Urology | Admitting: Urology

## 2015-05-16 ENCOUNTER — Encounter (HOSPITAL_COMMUNITY): Payer: Self-pay | Admitting: *Deleted

## 2015-05-16 ENCOUNTER — Ambulatory Visit: Payer: Medicare Other | Admitting: *Deleted

## 2015-05-16 VITALS — BP 176/78 | HR 74 | Temp 98.2°F | Resp 18

## 2015-05-16 DIAGNOSIS — B20 Human immunodeficiency virus [HIV] disease: Secondary | ICD-10-CM | POA: Insufficient documentation

## 2015-05-16 DIAGNOSIS — Z96649 Presence of unspecified artificial hip joint: Secondary | ICD-10-CM | POA: Diagnosis not present

## 2015-05-16 DIAGNOSIS — N3289 Other specified disorders of bladder: Secondary | ICD-10-CM | POA: Diagnosis not present

## 2015-05-16 DIAGNOSIS — Z79891 Long term (current) use of opiate analgesic: Secondary | ICD-10-CM | POA: Diagnosis not present

## 2015-05-16 DIAGNOSIS — C675 Malignant neoplasm of bladder neck: Secondary | ICD-10-CM | POA: Diagnosis not present

## 2015-05-16 DIAGNOSIS — J449 Chronic obstructive pulmonary disease, unspecified: Secondary | ICD-10-CM | POA: Diagnosis not present

## 2015-05-16 DIAGNOSIS — Z792 Long term (current) use of antibiotics: Secondary | ICD-10-CM | POA: Diagnosis not present

## 2015-05-16 DIAGNOSIS — D494 Neoplasm of unspecified behavior of bladder: Secondary | ICD-10-CM | POA: Diagnosis present

## 2015-05-16 DIAGNOSIS — C670 Malignant neoplasm of trigone of bladder: Secondary | ICD-10-CM

## 2015-05-16 DIAGNOSIS — Z8551 Personal history of malignant neoplasm of bladder: Secondary | ICD-10-CM | POA: Insufficient documentation

## 2015-05-16 DIAGNOSIS — I10 Essential (primary) hypertension: Secondary | ICD-10-CM | POA: Insufficient documentation

## 2015-05-16 DIAGNOSIS — R319 Hematuria, unspecified: Secondary | ICD-10-CM | POA: Diagnosis not present

## 2015-05-16 DIAGNOSIS — J45909 Unspecified asthma, uncomplicated: Secondary | ICD-10-CM | POA: Diagnosis not present

## 2015-05-16 DIAGNOSIS — F17210 Nicotine dependence, cigarettes, uncomplicated: Secondary | ICD-10-CM | POA: Insufficient documentation

## 2015-05-16 HISTORY — PX: TRANSURETHRAL RESECTION OF BLADDER TUMOR WITH GYRUS (TURBT-GYRUS): SHX6458

## 2015-05-16 SURGERY — TRANSURETHRAL RESECTION OF BLADDER TUMOR WITH GYRUS (TURBT-GYRUS)
Anesthesia: General | Laterality: Bilateral

## 2015-05-16 MED ORDER — LIDOCAINE HCL 2 % EX GEL
CUTANEOUS | Status: DC | PRN
  Administered 2015-05-16: 1

## 2015-05-16 MED ORDER — FENTANYL CITRATE (PF) 100 MCG/2ML IJ SOLN
INTRAMUSCULAR | Status: AC
  Filled 2015-05-16: qty 4

## 2015-05-16 MED ORDER — DEXAMETHASONE SODIUM PHOSPHATE 10 MG/ML IJ SOLN
INTRAMUSCULAR | Status: DC | PRN
  Administered 2015-05-16: 10 mg via INTRAVENOUS

## 2015-05-16 MED ORDER — EPHEDRINE SULFATE 50 MG/ML IJ SOLN
INTRAMUSCULAR | Status: AC
  Filled 2015-05-16: qty 1

## 2015-05-16 MED ORDER — LACTATED RINGERS IV SOLN: INTRAVENOUS | Status: DC

## 2015-05-16 MED ORDER — BELLADONNA ALKALOIDS-OPIUM 16.2-60 MG RE SUPP
RECTAL | Status: DC | PRN
  Administered 2015-05-16: 1 via RECTAL

## 2015-05-16 MED ORDER — LIDOCAINE HCL (CARDIAC) 20 MG/ML IV SOLN
INTRAVENOUS | Status: AC
  Filled 2015-05-16: qty 5

## 2015-05-16 MED ORDER — PHENYLEPHRINE HCL 10 MG/ML IJ SOLN
INTRAMUSCULAR | Status: DC | PRN
  Administered 2015-05-16: 80 ug via INTRAVENOUS

## 2015-05-16 MED ORDER — BELLADONNA ALKALOIDS-OPIUM 16.2-60 MG RE SUPP
RECTAL | Status: AC
  Filled 2015-05-16: qty 1

## 2015-05-16 MED ORDER — MIDAZOLAM HCL 2 MG/2ML IJ SOLN
INTRAMUSCULAR | Status: AC
  Filled 2015-05-16: qty 4

## 2015-05-16 MED ORDER — HYDROMORPHONE HCL 1 MG/ML IJ SOLN: 0.2500 mg | INTRAMUSCULAR | Status: DC | PRN

## 2015-05-16 MED ORDER — FENTANYL CITRATE (PF) 100 MCG/2ML IJ SOLN
INTRAMUSCULAR | Status: DC | PRN
  Administered 2015-05-16 (×4): 50 ug via INTRAVENOUS

## 2015-05-16 MED ORDER — MIDAZOLAM HCL 5 MG/5ML IJ SOLN
INTRAMUSCULAR | Status: DC | PRN
  Administered 2015-05-16: 2 mg via INTRAVENOUS

## 2015-05-16 MED ORDER — IOHEXOL 300 MG/ML  SOLN
INTRAMUSCULAR | Status: DC | PRN
  Administered 2015-05-16: 20 mL

## 2015-05-16 MED ORDER — LIDOCAINE HCL (CARDIAC) 20 MG/ML IV SOLN
INTRAVENOUS | Status: DC | PRN
  Administered 2015-05-16: 100 mg via INTRAVENOUS

## 2015-05-16 MED ORDER — PHENYLEPHRINE 40 MCG/ML (10ML) SYRINGE FOR IV PUSH (FOR BLOOD PRESSURE SUPPORT)
PREFILLED_SYRINGE | INTRAVENOUS | Status: AC
  Filled 2015-05-16: qty 10

## 2015-05-16 MED ORDER — ONDANSETRON HCL 4 MG/2ML IJ SOLN
INTRAMUSCULAR | Status: AC
  Filled 2015-05-16: qty 2

## 2015-05-16 MED ORDER — LIDOCAINE HCL 2 % EX GEL
CUTANEOUS | Status: AC
  Filled 2015-05-16: qty 5

## 2015-05-16 MED ORDER — FENTANYL CITRATE (PF) 100 MCG/2ML IJ SOLN
INTRAMUSCULAR | Status: AC
Start: 2015-05-16 — End: 2015-05-16
  Filled 2015-05-16: qty 4

## 2015-05-16 MED ORDER — PHENAZOPYRIDINE HCL 200 MG PO TABS: 200.0000 mg | ORAL_TABLET | Freq: Three times a day (TID) | ORAL | Status: DC | PRN

## 2015-05-16 MED ORDER — OXYCODONE-ACETAMINOPHEN 5-325 MG PO TABS: 1.0000 | ORAL_TABLET | Freq: Four times a day (QID) | ORAL | Status: DC | PRN

## 2015-05-16 MED ORDER — PROPOFOL 10 MG/ML IV BOLUS
INTRAVENOUS | Status: AC
  Filled 2015-05-16: qty 20

## 2015-05-16 MED ORDER — PROPOFOL 10 MG/ML IV BOLUS
INTRAVENOUS | Status: DC | PRN
  Administered 2015-05-16: 150 mg via INTRAVENOUS

## 2015-05-16 MED ORDER — CIPROFLOXACIN IN D5W 400 MG/200ML IV SOLN
INTRAVENOUS | Status: AC
  Filled 2015-05-16: qty 200

## 2015-05-16 MED ORDER — SODIUM CHLORIDE 0.9 % IJ SOLN
INTRAMUSCULAR | Status: AC
  Filled 2015-05-16: qty 10

## 2015-05-16 MED ORDER — EPHEDRINE SULFATE 50 MG/ML IJ SOLN
INTRAMUSCULAR | Status: DC | PRN
  Administered 2015-05-16: 10 mg via INTRAVENOUS

## 2015-05-16 MED ORDER — LACTATED RINGERS IV SOLN
INTRAVENOUS | Status: DC | PRN
  Administered 2015-05-16: 08:00:00 via INTRAVENOUS

## 2015-05-16 MED ORDER — CIPROFLOXACIN IN D5W 400 MG/200ML IV SOLN
400.0000 mg | INTRAVENOUS | Status: AC
  Administered 2015-05-16: 400 mg via INTRAVENOUS

## 2015-05-16 MED ORDER — SODIUM CHLORIDE 0.9 % IR SOLN
Status: DC | PRN
  Administered 2015-05-16: 6000 mL

## 2015-05-16 SURGICAL SUPPLY — 13 items
BAG URINE DRAINAGE (UROLOGICAL SUPPLIES) IMPLANT
BAG URO CATCHER STRL LF (DRAPE) ×3 IMPLANT
ELECT REM PT RETURN 9FT ADLT (ELECTROSURGICAL)
ELECTRODE REM PT RTRN 9FT ADLT (ELECTROSURGICAL) ×1 IMPLANT
EVACUATOR MICROVAS BLADDER (UROLOGICAL SUPPLIES) IMPLANT
GLOVE BIOGEL M STRL SZ7.5 (GLOVE) ×3 IMPLANT
KIT ASPIRATION TUBING (SET/KITS/TRAYS/PACK) IMPLANT
LOOP CUT BIPOLAR 24F LRG (ELECTROSURGICAL) ×3 IMPLANT
MANIFOLD NEPTUNE II (INSTRUMENTS) ×3 IMPLANT
PACK CYSTO (CUSTOM PROCEDURE TRAY) ×3 IMPLANT
SYRINGE IRR TOOMEY STRL 70CC (SYRINGE) IMPLANT
TUBING CONNECTING 10 (TUBING) ×2 IMPLANT
TUBING CONNECTING 10' (TUBING) ×1

## 2015-05-16 NOTE — H&P (Signed)
Reason For Visit Follow-up for bladder cancer   History of Present Illness 49M presents to establish care. He has a history of bladder cancer and has been treated in [place], [place].     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     Intv: Patient presents today for interval surveillence of his bladder cancer. h/o CIS in 2015 and T1 LG in 2013. Seen two weeks ago for 6 month follow-up and was having progressive voiding symptoms/dysuria. He was started on Cipro, although his culture later returned as no growth. The patient states that he continues to have some hematuria, his dysuria has improved slightly. He continues to have some symptoms however. He denies any constitutional symptoms. He denies any fevers or chills. He denies any changes in his bowel movements or neurologic symptoms.   Past Medical History Problems  1. History of asthma (Z87.09) 2. History of bladder cancer (Z85.51) 3. History of chronic obstructive lung disease (Z87.09) 4. History of hypertension (Z86.79) 5. History of stomach ulcers (Z87.19) 6. History of Hyponatremia (E87.1)  Surgical History Problems  1. History of Back Surgery 2. History of Bladder Surgery 3. History of Cataract Surgery 4. History of Cystoscopy With Fulguration Minor Lesion (Under 5mm) 5. History of Hip Replacement  Current Meds 1. Ciprofloxacin HCl - 500 MG Oral Tablet; Take 1 tablet twice daily;  Therapy: 15Sep2016 to (Evaluate:22Sep2016); Last Rx:15Sep2016 Ordered 2. MiraLax Oral  Powder;  Therapy: (Recorded:17Sep2015) to Recorded 3. Oxycodone-Acetaminophen 5-325 MG Oral Tablet;  Therapy: (Recorded:17Sep2015) to Recorded  Allergies Medication  1. Aspirin TABS  Family History Problems  1. Family history of Death of family member : Mother, Father   Mother at age20; heart attackFather; unknown 2. Family history of malignant neoplasm of breast (Z80.3) : Grandmother 3. Family history of myocardial infarction (Z82.49) : Mother  Social History Problems  1. Alcohol use (Z78.9)   2 beers a day 2. Caffeine use (F15.90)   5-6 tea per day 3. Current every day smoker (F17.200)   0.50 packs/day for 17 yrs 4. Disabled 5. Number of children   2 sons and 4 daughters 50. Separated from significant other (Z63.5)  Physical Exam Constitutional: Well nourished and well developed . No acute distress.  Pulmonary: No respiratory distress and normal respiratory rhythm and effort.  Cardiovascular: Heart rate and rhythm are normal . No peripheral edema.    Results/Data Urine [Data Includes: Last 1 Day]   03Oct2016  COLOR RED   APPEARANCE CLOUDY   SPECIFIC GRAVITY 1.010   pH 5.5   GLUCOSE NEGATIVE   BILIRUBIN NEGATIVE   KETONE NEGATIVE   BLOOD 3+   PROTEIN TRACE   NITRITE NEGATIVE   LEUKOCYTE ESTERASE TRACE   SQUAMOUS EPITHELIAL/HPF 0-5 HPF  WBC 6-10 WBC/HPF  RBC >60 RBC/HPF  BACTERIA FEW HPF  CRYSTALS NONE SEEN HPF  CASTS NONE SEEN LPF  Yeast NONE SEEN HPF   The patient's urinalysis demonstrates hematuria, no clear  indication of infection. I did send a urine culture however.   Procedure  Procedure: Cystoscopy   Indication: Hematuria. Lower Urinary Tract Symptoms. History of Urothelial Carcinoma.  Informed Consent: from the patient . Specific risks including, but not limited to bleeding, infection, pain, allergic reaction etc. were explained.  Prep: The patient was prepped with betadine.  Anesthesia:. Local anesthesia was administered intraurethrally  with 2% lidocaine jelly.  Antibiotic prophylaxis: Ciprofloxacin.  Procedure Note:  Urethral meatus:. No abnormalities.  Anterior urethra: No abnormalities.  Prostatic urethra:. No intravesical median lobe was visualized.  Bladder: Visulization was obscured due to blood. The ureteral orifices were in the normal anatomic position bilaterally. A sessile tumor was seen in the bladder. This tumor was located on the anterior aspect, at the neck of the bladder. (Patient has a concerning lesion on his right bladder neck anteriorly measuring approximately 2 cm in size) Urine was sent for culture. The patient tolerated the procedure well.  Complications: None.    Assessment Assessed  1. 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 He is status post 6 weeks of BCG which completed in May 2015. Recent biospy revealed inflammation.  We did discuss interval surveillance cystoscopy versus maintenance BCG. This point we have opted for cystoscopy in 6 months.   Plan Health Maintenance  1. UA With REFLEX; [Do Not Release]; Status:Complete;   Done: [id number] 01:03PM Malignant neoplasm of urinary bladder, unspecified site  2. Cysto; Status:Hold For - Date of Service; Requested for:03Oct2016;  3. URINE CULTURE; Status:Hold For - Specimen/Data Collection,Appointment; Requested  for:03Oct2016;  4. Follow-up Schedule Surgery Office  Follow-up  Status: Hold For - Appointment   Requested for: 03Oct2016  Discussion/Summary Unfortunately, the patient appears to have a recurrence of his bladder cancer. He certainly is having worsening symptoms and the findings on cystoscopy make me concerned. We spoke about this in detail and I recommended he consider a TURBT within the next 2-4 weeks. The patient has agreed to proceed. I discussed with him the risks of the operation as well  as as well as the expected recovery.Marland Kitchen

## 2015-05-16 NOTE — Anesthesia Procedure Notes (Signed)
Procedure Name: LMA Insertion Date/Time: 05/16/2015 8:39 AM Performed by: Maxwell Caul Pre-anesthesia Checklist: Patient identified, Emergency Drugs available, Suction available and Patient being monitored Patient Re-evaluated:Patient Re-evaluated prior to inductionOxygen Delivery Method: Circle system utilized Preoxygenation: Pre-oxygenation with 100% oxygen Intubation Type: IV induction LMA: LMA inserted LMA Size: 4.0 Number of attempts: 1 Placement Confirmation: positive ETCO2,  CO2 detector and breath sounds checked- equal and bilateral Tube secured with: Tape

## 2015-05-16 NOTE — Op Note (Signed)
Preoperative diagnosis:  1. Bladder tumor 2 cm in size   Postoperative diagnosis:  1. Same, located in the anterior bladder neck region   Procedure: 1. Transurethral resection of bladder tumor 2 cm in size 2. Bilateral retrograde pyelograms with interpretation  Surgeon: Ardis Hughs, MD  Anesthesia: General  Complications: None  Intraoperative findings: Retrograde pyelograms were performed using 5 mL of Omnipaque contrast per ureter. The left ureter demonstrated a normal caliber with no filling defects. The renal pelvis and calyces were sharply delineated with no hydronephrosis or filling defects. The right ureter demonstrated a normal caliber with no filling defects. The right renal pelvis and calyces were sharp without evidence of any filling defects.  EBL: Minimal  Specimens: Bladder tumor at the right anterior bladder neck  Indication: [name] is a 59 y.o. 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  During the patient's routine bladder cancer surveillance cystoscopy he was noted to have a lesion at the anterior bladder neck.  After reviewing the management options for treatment, he elected to proceed with the above surgical procedure(s). We have discussed the potential benefits and risks of the procedure, side effects of the proposed treatment, the likelihood of the patient achieving the goals of the procedure, and any potential problems that might occur during the procedure or recuperation. Informed consent has been obtained.  Description of procedure:  The patient was taken to the operating room and general anesthesia was induced.  The patient was placed in the dorsal lithotomy position, prepped and draped in the usual sterile fashion, and preoperative antibiotics were administered. A preoperative time-out was performed.    21 French 30 cystoscope was gently passed through the patient's urethra and into the bladder. This was  then exchanged for a 70 cystoscope and a 360 cystoscopic evaluation was performed. The patient was noted to have a broad-based 2 cm lesion at the bladder neck on the right-hand side at the approximately 9:00 to 12:00 position. There were no other significant abnormalities. The 70 scope was then exchanged for the 30 lens and retrograde pyelograms were performed with the above findings. The cystoscope was then removed.  I then inserted the 30 lens with a 26 French sheath using a visual obturator. I then exchanged the obturator for the loop cautery. I then resected the lesion at the anterior bladder neck on the right-hand side in a routine fashion. Once the tumor had been completely resected hemostasis was achieved. The bladder chips were then removed and sent to pathology. A B and O suppository was then placed. The scope was removed. Lidocaine jelly was then injected into the patient's urethra.   The patient was subsequently extubated and returned to the PACU in stable condition.  Ardis Hughs, M.D.

## 2015-05-16 NOTE — Anesthesia Postprocedure Evaluation (Signed)
  Anesthesia Post-op Note  Patient: [name]  Procedure(s) Performed: Procedure(s) (LRB): TRANSURETHRAL RESECTION OF BLADDER TUMOR WITH GYRUS (TURBT-GYRUS), bilateral retrograde pyelogram (Bilateral)  Patient Location: PACU  Anesthesia Type: General  Level of Consciousness: awake and alert   Airway and Oxygen Therapy: Patient Spontanous Breathing  Post-op Pain: mild  Post-op Assessment: Post-op Vital signs reviewed, Patient's Cardiovascular Status Stable, Respiratory Function Stable, Patent Airway and No signs of Nausea or vomiting  Last Vitals:  Filed Vitals:   05/16/15 1051  BP: 156/69  Pulse: 72  Temp: 36.7 C  Resp: 14    Post-op Vital Signs: stable   Complications: No apparent anesthesia complications

## 2015-05-16 NOTE — Transfer of Care (Signed)
Immediate Anesthesia Transfer of Care Note  Patient: [name]  Procedure(s) Performed: Procedure(s): TRANSURETHRAL RESECTION OF BLADDER TUMOR WITH GYRUS (TURBT-GYRUS), bilateral retrograde pyelogram (Bilateral)  Patient Location: PACU  Anesthesia Type:General  Level of Consciousness:  sedated, patient cooperative and responds to stimulation  Airway & Oxygen Therapy:Patient Spontanous Breathing and Patient connected to face mask oxgen  Post-op Assessment:  Report given to PACU RN and Post -op Vital signs reviewed and stable  Post vital signs:  Reviewed and stable  Last Vitals:  Filed Vitals:   05/16/15 0653  BP:   Pulse: 80  Temp:   Resp:     Complications: No apparent anesthesia complications

## 2015-05-16 NOTE — Interval H&P Note (Signed)
History and Physical Interval Note: No changes OK to proceed  05/16/2015 8:30 AM  [name]  has presented today for surgery, with the diagnosis of recurrent bladder tumor  The various methods of treatment have been discussed with the patient and family. After consideration of risks, benefits and other options for treatment, the patient has consented to  Procedure(s): TRANSURETHRAL RESECTION OF BLADDER TUMOR WITH GYRUS (TURBT-GYRUS), bilateral retrograde pyelogram (Bilateral) as a surgical intervention .  The patient's history has been reviewed, patient examined, no change in status, stable for surgery.  I have reviewed the patient's chart and labs.  Questions were answered to the patient's satisfaction.     Louis Meckel W

## 2015-05-16 NOTE — Discharge Instructions (Signed)
Transurethral Resection of Bladder Tumor (TURBT) or Bladder Biopsy   Definition:  Transurethral Resection of the Bladder Tumor is a surgical procedure used to diagnose and remove tumors within the bladder. TURBT is the most common treatment for early stage bladder cancer.  General instructions:     Your recent bladder surgery requires very little post hospital care but some definite precautions.  Despite the fact that no skin incisions were used, the area around the bladder incisions are raw and covered with scabs to promote healing and prevent bleeding. Certain precautions are needed to insure that the scabs are not disturbed over the next 2-4 weeks while the healing proceeds.  Because the raw surface inside your bladder and the irritating effects of urine you may expect frequency of urination and/or urgency (a stronger desire to urinate) and perhaps even getting up at night more often. This will usually resolve or improve slowly over the healing period. You may see some blood in your urine over the first 6 weeks. Do not be alarmed, even if the urine was clear for a while. Get off your feet and drink lots of fluids until clearing occurs. If you start to pass clots or don't improve call us.  Diet:  You may return to your normal diet immediately. Because of the raw surface of your bladder, alcohol, spicy foods, foods high in acid and drinks with caffeine may cause irritation or frequency and should be used in moderation. To keep your urine flowing freely and avoid constipation, drink plenty of fluids during the day (8-10 glasses). Tip: Avoid cranberry juice because it is very acidic.  Activity:  Your physical activity doesn't need to be restricted. However, if you are very active, you may see some blood in the urine. We suggest that you reduce your activity under the circumstances until the bleeding has stopped.  Bowels:  It is important to keep your bowels regular during the postoperative  period. Straining with bowel movements can cause bleeding. A bowel movement every other day is reasonable. Use a mild laxative if needed, such as milk of magnesia 2-3 tablespoons, or 2 Dulcolax tablets. Call if you continue to have problems. If you had been taking narcotics for pain, before, during or after your surgery, you may be constipated. Take a laxative if necessary.    Medication:  You should resume your pre-surgery medications unless told not to. In addition you may be given an antibiotic to prevent or treat infection. Antibiotics are not always necessary. All medication should be taken as prescribed until the bottles are finished unless you are having an unusual reaction to one of the drugs.     General Anesthesia, Adult, Care After Refer to this sheet in the next few weeks. These instructions provide you with information on caring for yourself after your procedure. Your health care provider may also give you more specific instructions. Your treatment has been planned according to current medical practices, but problems sometimes occur. Call your health care provider if you have any problems or questions after your procedure. WHAT TO EXPECT AFTER THE PROCEDURE After the procedure, it is typical to experience:  Sleepiness.  Nausea and vomiting. HOME CARE INSTRUCTIONS  For the first 24 hours after general anesthesia:  Have a responsible person with you.  Do not drive a car. If you are alone, do not take public transportation.  Do not drink alcohol.  Do not take medicine that has not been prescribed by your health care provider.  Do not  sign important papers or make important decisions.  You may resume a normal diet and activities as directed by your health care provider.  Change bandages (dressings) as directed.  If you have questions or problems that seem related to general anesthesia, call the hospital and ask for the anesthetist or anesthesiologist on call. SEEK  MEDICAL CARE IF:  You have nausea and vomiting that continue the day after anesthesia.  You develop a rash. SEEK IMMEDIATE MEDICAL CARE IF:   You have difficulty breathing.  You have chest pain.  You have any allergic problems.   This information is not intended to replace advice given to you by your health care provider. Make sure you discuss any questions you have with your health care provider.   Document Released: 10/27/2000 Document Revised: 08/11/2014 Document Reviewed: 11/19/2011 Elsevier Interactive Patient Education [employer].

## 2015-05-18 NOTE — Patient Instructions (Signed)
Please refer to progress note for education provided 

## 2015-05-18 NOTE — Progress Notes (Signed)
Patient ID: [name], male   DOB: Apr 26, 1956, 59 y.o.   MRN: [id number] RN meet with the patient as planned post surgery. Pt had a tumor removed from his Bladder for the 2nd time per patient. Pt stated he had the same surgery for the 1st time in [facility]. Pt stated the tumor will be biopsied as well. RN ensured that the patient is comfortable with needs met at this time. RN instructed the patient on how to correctly take his medications as ordered. The patient states he does not have his HIV medication on hand. RN will f/u on the patient's medications to ensure he has what he needs to be complaint with his regimen

## 2015-05-25 ENCOUNTER — Ambulatory Visit: Payer: Self-pay | Admitting: *Deleted

## 2015-05-25 VITALS — BP 158/66 | HR 84 | Temp 98.2°F | Resp 18

## 2015-05-25 DIAGNOSIS — C670 Malignant neoplasm of trigone of bladder: Secondary | ICD-10-CM

## 2015-05-25 DIAGNOSIS — B20 Human immunodeficiency virus [HIV] disease: Secondary | ICD-10-CM

## 2015-05-25 NOTE — Progress Notes (Signed)
Patient ID: [name], male   DOB: April 23, 1956, 59 y.o.   MRN: [id number]  RN spoke with the patient prior to our visit today and the patient stated he will be evicted from his apartment on the 26th and he also received some bad news that his cancer has spread and he will need another surgery. RN f/u with [name]([facility]) and Production designer, theatre/television/film) with the above updates.   RN made a home visit with the patient today and I explained to him that his body has to be strong to be able to fight cancer. Explained to the patient that his HIV is weakening his body and will make it hard for him to fight cancer and infection. RN explained to the patient that if he takes his HIV medication each day it will help him fight his cancer. Pt verbalized understanding and located all his medications. RN filled a pill box for the patient and explained to him how to use his pill box to manage his medications. Pt agreed to take his medications each day because he states he wants to be stronger so he can fight his cancer.   RN contacted the patient and checked on his appt time for the 27th with [facility]. Pt's appt time is for 1:30 on the 27th. Plan at this time is to accompany the patient to ensure Dr Louis Meckel has all the pt's hx and the patient has a clear understanding of what is going on.

## 2015-05-29 ENCOUNTER — Ambulatory Visit (INDEPENDENT_AMBULATORY_CARE_PROVIDER_SITE_OTHER): Payer: Medicare Other | Admitting: Internal Medicine

## 2015-05-29 ENCOUNTER — Encounter: Payer: Self-pay | Admitting: Internal Medicine

## 2015-05-29 ENCOUNTER — Ambulatory Visit: Payer: Medicare Other | Admitting: *Deleted

## 2015-05-29 VITALS — BP 173/73 | HR 78 | Temp 97.6°F | Ht 64.0 in | Wt 136.8 lb

## 2015-05-29 DIAGNOSIS — R300 Dysuria: Secondary | ICD-10-CM | POA: Diagnosis not present

## 2015-05-29 DIAGNOSIS — B20 Human immunodeficiency virus [HIV] disease: Secondary | ICD-10-CM | POA: Diagnosis not present

## 2015-05-29 DIAGNOSIS — F4323 Adjustment disorder with mixed anxiety and depressed mood: Secondary | ICD-10-CM

## 2015-05-29 NOTE — BH Specialist Note (Signed)
Ladavion was present today for his scheduled appointment with counselor.  Client was not in the best of spirits as a result of the new information about his cancer spreading. Client shared that he was so tired of going to the doctor and so tired of having to take medications. Counselor discussed with client the alternative of not taking his medications and the consequences as well.  Client determined that not taking his medications would be worse than having to take them. Client stated that he has some support but has always been a loner.  Client stated that his kids call him on a regular basis but he tends to limit how much time he spends with them.  Client shared that sometimes all the health issues he is experiencing are too much and he is over whelmed.  Counselor talked with client about coping and participating in activities that can help distract from the negative aspects of his life right now. Client indicted that he is spending a lot of time either at home right now or at doctor appointments.  Counselor encouraged client to seek things he can do for fun and relaxation.  Client said that he does feel like he is in a rut right now and has detached a lot. Client shared that he knows he will be ok no matter what the future brings and realizes that he could possibly make an already difficult situation even worse by not having the right attitude. Counselor processed with client and provided support accordingly. Another appointment was made for next week.   Rolena Infante, MA, [name]

## 2015-05-29 NOTE — Assessment & Plan Note (Signed)
He has been worried that his HIV was not well controlled. He has been concerned that it could be making his cancer worse. He was also concerned yesterday that his fever and sweats might have been due to HIV infection. I told him that his viral load 1 month ago was much lower indicating better control of his infection. I will continue Genvoya and repeat lab work in 6 weeks.

## 2015-05-29 NOTE — Progress Notes (Signed)
Patient ID: [name], male   DOB: 1955/09/20, 59 y.o.   MRN: [id number]          Patient Active Problem List   Diagnosis Date Noted  . HIV disease ([place]) 04/18/2015    Priority: High  . Dysuria 05/29/2015    Priority: Medium  . Depression 04/19/2015  . 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 HTN (hypertension) 03/30/2015  . Asthma, chronic 03/30/2015  . Malignant neoplasm of bladder ([place]) 03/30/2015    Patient's Medications  New Prescriptions   No medications on file  Previous Medications   ALBUTEROL (PROVENTIL HFA;VENTOLIN HFA) 108 (90 BASE) MCG/ACT INHALER    Inhale 2 puffs into the lungs every 6 (six) hours as needed for wheezing or shortness of breath.   ALBUTEROL (PROVENTIL) (2.5 MG/3ML) 0.083% NEBULIZER SOLUTION    Take 3 mLs (2.5 mg total) by nebulization every 6 (six) hours as needed for wheezing or shortness of breath.   AMLODIPINE (NORVASC) 5 MG TABLET    Take 1 tablet (5 mg total) by mouth daily.   ELVITEGRAVIR-COBICISTAT-EMTRICITABINE-TENOFOVIR (GENVOYA) 150-150-200-10 MG TABS TABLET    Take 1 tablet by mouth daily with breakfast.   OXYCODONE-ACETAMINOPHEN (PERCOCET/ROXICET) 5-325 MG TABLET    Take 1-2 tablets by mouth every 6 (six) hours as needed for severe pain.   PHENAZOPYRIDINE (PYRIDIUM) 200 MG TABLET    Take 1 tablet (200 mg total) by mouth 3 (three) times daily as needed for pain.  Modified Medications   No medications on file  Discontinued Medications   COMPLERA 200-25-300 MG TABS    Take 1 tablet by mouth daily.     Subjective: [name] is in for his routine HIV follow-up visit. He recently changed from [place] to [facility] because he was having problems tolerating the [place]. He felt like it was causing him to have nausea and occasional vomiting. This has resolved completely since he switched to [facility]. He takes it each evening with his dinner meal. He has only missed one dose since the switch. That occurred when he underwent repeat cystoscopy and  resection of his bladder tumor recently.   He has been taking Pyridium and Percocet for his dysuria. Last week his dysuria seem to be going away but has gotten much worse over the past few days. Yesterday morning while riding the bus to the grocery store he became extremely weak and broke into a profuse sweat. He felt like he was having fever. The episode lasted about 30 minutes before resolving spontaneously. He has had no further problems with fever or sweats since that time. He states that he was feeling extremely bad and thought he would need to call 911.  Review of Systems: Pertinent items are noted in HPI.  Past Medical History  Diagnosis Date  . Hypertension   . Migraine   . Nocturia   . Bladder cancer (HCC)     and prostatic urethra cancer  HX TURBT'S IN [place], [place]  WITH INSTILLATION CHEMO TX'S  . Asthma with COPD ([place])   . Complication of anesthesia     "I WAS SHAKING BAD - BUT THEY BROUGHT ME OUT OF IT"  . History of transfusion   . HIV disease ([place])     under care of [facility]    Social History  Substance Use Topics  . Smoking status: Current Every Day Smoker -- 0.50 packs/day for 17 years    Types: Cigarettes  . Smokeless tobacco: Never Used  . Alcohol  Use: 2.4 oz/week    4 Cans of beer per week     Comment: occasional    Family History  Problem Relation Age of Onset  . Cancer Mother     Gastric Cancer  . Hypertension Mother   . Heart disease Mother   . Cancer Maternal Grandmother     breast cancer     Allergies  Allergen Reactions  . Aspirin Shortness Of Breath, Nausea And Vomiting and Swelling    Objective:  Filed Vitals:   05/29/15 1022  BP: 173/73  Pulse: 78  Temp: 97.6 F (36.4 C)  TempSrc: Oral  Height: 5\' 4"  (1.626 m)  Weight: 136 lb 12.8 oz (62.052 kg)   Body mass index is 23.47 kg/(m^2).  General: His weight is unchanged. He is well dressed and in no distress Oral: No oropharyngeal lesions Skin: No  rash Lungs: Clear Cor: Regular S1 and S2 with no murmur Abdomen: Mild suprapubic tenderness. No CVA tenderness Mood: He appears mildly worried but does not appear depressed or anxious  Lab Results Lab Results  Component Value Date   WBC 4.1 05/11/2015   HGB 12.1* 05/11/2015   HCT 36.1* 05/11/2015   MCV 97.3 05/11/2015   PLT 278 05/11/2015    Lab Results  Component Value Date   CREATININE 1.00 05/11/2015   BUN 12 05/11/2015   NA 137 05/11/2015   K 4.3 05/11/2015   CL 106 05/11/2015   CO2 25 05/11/2015    Lab Results  Component Value Date   ALT 11 03/20/2015   AST 38* 03/20/2015   ALKPHOS 115 03/20/2015   BILITOT 0.4 03/20/2015    Lab Results  Component Value Date   CHOL 124* 03/20/2015   HDL 70 03/20/2015   LDLCALC 44 03/20/2015   TRIG 48 03/20/2015   CHOLHDL 1.8 03/20/2015    Lab Results HIV 1 RNA QUANT (copies/mL)  Date Value  04/19/2015 172*  03/20/2015 8903*   CD4 T CELL ABS (/uL)  Date Value  03/20/2015 620     Problem List Items Addressed This Visit      High   HIV disease ([place])    He has been worried that his HIV was not well controlled. He has been concerned that it could be making his cancer worse. He was also concerned yesterday that his fever and sweats might have been due to HIV infection. I told him that his viral load 1 month ago was much lower indicating better control of his infection. I will continue Genvoya and repeat lab work in 6 weeks.      Relevant Orders   T-helper cell (CD4)- ([name] clinic only)   HIV 1 RNA quant-no reflex-bld     Medium   Dysuria - Primary    I will check a urinalysis and urine culture given his worsening dysuria and yesterday's episode of possible fever and profuse sweating.      Relevant Orders   Urinalysis, Routine w reflex microscopic   Urine Culture        Michel Bickers, MD [facility] : [facility] for [place] [phone] pager   [phone] cell 05/29/2015, 10:57 AM

## 2015-05-29 NOTE — Assessment & Plan Note (Signed)
I will check a urinalysis and urine culture given his worsening dysuria and yesterday's episode of possible fever and profuse sweating.

## 2015-05-30 LAB — URINALYSIS, ROUTINE W REFLEX MICROSCOPIC
Bilirubin Urine: NEGATIVE
GLUCOSE, UA: NEGATIVE
Ketones, ur: NEGATIVE
NITRITE: POSITIVE — AB
PH: 6.5 (ref 5.0–8.0)
Specific Gravity, Urine: 1.016 (ref 1.001–1.035)

## 2015-05-30 LAB — URINALYSIS, MICROSCOPIC ONLY
BACTERIA UA: NONE SEEN [HPF]
CASTS: NONE SEEN [LPF]
Crystals: NONE SEEN [HPF]
SQUAMOUS EPITHELIAL / LPF: NONE SEEN [HPF] (ref <=5)
YEAST: NONE SEEN [HPF]

## 2015-05-30 NOTE — Patient Instructions (Signed)
Please refer to the progress note for education and details provided during today's visit

## 2015-05-31 ENCOUNTER — Ambulatory Visit: Payer: Self-pay | Admitting: *Deleted

## 2015-05-31 DIAGNOSIS — C675 Malignant neoplasm of bladder neck: Secondary | ICD-10-CM | POA: Diagnosis not present

## 2015-05-31 DIAGNOSIS — C679 Malignant neoplasm of bladder, unspecified: Secondary | ICD-10-CM | POA: Diagnosis not present

## 2015-05-31 DIAGNOSIS — C670 Malignant neoplasm of trigone of bladder: Secondary | ICD-10-CM

## 2015-05-31 DIAGNOSIS — N3000 Acute cystitis without hematuria: Secondary | ICD-10-CM | POA: Diagnosis not present

## 2015-05-31 DIAGNOSIS — B20 Human immunodeficiency virus [HIV] disease: Secondary | ICD-10-CM

## 2015-05-31 LAB — URINE CULTURE
Colony Count: NO GROWTH
Organism ID, Bacteria: NO GROWTH

## 2015-06-01 ENCOUNTER — Other Ambulatory Visit: Payer: Self-pay | Admitting: Urology

## 2015-06-04 NOTE — Progress Notes (Signed)
Patient ID: [name], male   DOB: 09/06/1955, 59 y.o.   MRN: [id number]  RN meet with the patient for his appt at [facility]/Dr Louis Meckel. Dr Louis Meckel informed the patient that his cancerous tumor has most likely spread to the smooth muscle tissue per the biopsy but has not completely invaded the muscular tissue. Dr Louis Meckel infomred the patient that if that happen he will most likely have to remove his bladder. Dr Louis Meckel informed the patient that to keep that from happening we need to hve another surgery to scrape the muscle tissue again. Pt agreed to the surgery and it will be planned for December. RN stayed with the patient and will reinforce/remind the patient of what he has been told by Dr Louis Meckel

## 2015-06-05 ENCOUNTER — Other Ambulatory Visit: Payer: Self-pay | Admitting: Internal Medicine

## 2015-06-05 DIAGNOSIS — I10 Essential (primary) hypertension: Secondary | ICD-10-CM

## 2015-06-13 ENCOUNTER — Ambulatory Visit: Payer: Self-pay | Admitting: *Deleted

## 2015-06-13 VITALS — BP 146/80 | HR 80 | Temp 97.4°F | Resp 19

## 2015-06-13 DIAGNOSIS — B20 Human immunodeficiency virus [HIV] disease: Secondary | ICD-10-CM

## 2015-06-15 NOTE — Progress Notes (Signed)
Patient ID: [name], male   DOB: [birth year], 59 y.o.   MRN: [id number] Rn meet with the patient in his new apartment. Pt states he continues to take his medication each day as ordered and his BP is noted to be within normal limits during today's visit. Pt is very pleased with his new apartment that [name] assisted him to get. Pt states he may be able to save up money now and catch his bills up. Pt's assessment was unremarkable and he states his pain is much better. He is now ready to have his procedure on the 1st of December as planned. During today's visit RN assisted the patient with 2 bus passes to ensure his has transportation to his appt at the clinic. RN also gave the patient a calendar with all of this upcoming appts listed on the calendar to keep him on track.Plan at this time is to continue to f/u with the patient to ensure his is undetectable as expected.

## 2015-06-15 NOTE — Patient Instructions (Signed)
Please refer to progress note for the details of this visit 

## 2015-06-19 ENCOUNTER — Ambulatory Visit: Payer: Medicare Other | Admitting: *Deleted

## 2015-06-19 DIAGNOSIS — F329 Major depressive disorder, single episode, unspecified: Secondary | ICD-10-CM

## 2015-06-19 DIAGNOSIS — F32A Depression, unspecified: Secondary | ICD-10-CM

## 2015-06-19 NOTE — BH Specialist Note (Signed)
[name] was present for his scheduled appointment today.  Client was oriented times four with good affect and dress.  Client was alert and in pretty good spirits today. Client shared that he is feeling a little better physically but is a bit anxious about the next surgery he has to have on the first of December.  Counselor educated client on tips for handling anxiety and improving his coping skills in order to handle the upcoming events associated with his cancer and HIV.  Counselor reviewed the tips such as: getting enough rest, breathing exercises, processing with a counselor, limit alcohol and caffeine, build natural supports, enjoy recreational activities regularly. Client indicated that his family are not as involved with him as he would like as far as support goes.  Counselor communicated to client about the new peer support program.  Client said that he was interested in it and wrote down the next meeting time and date. Counselor explained that having a good support system was vital to his healing. Client made another appointment for two weeks out.  Rolena Infante, MA, [name]

## 2015-06-26 ENCOUNTER — Encounter (HOSPITAL_COMMUNITY)
Admission: RE | Admit: 2015-06-26 | Discharge: 2015-06-26 | Disposition: A | Discharge status: Home / Self Care | Payer: Medicare Other | Source: Ambulatory Visit | Attending: Urology | Admitting: Urology

## 2015-06-26 ENCOUNTER — Encounter (HOSPITAL_COMMUNITY): Payer: Self-pay

## 2015-06-26 ENCOUNTER — Telehealth: Payer: Self-pay | Admitting: *Deleted

## 2015-06-26 DIAGNOSIS — C679 Malignant neoplasm of bladder, unspecified: Secondary | ICD-10-CM | POA: Diagnosis not present

## 2015-06-26 DIAGNOSIS — Z01818 Encounter for other preprocedural examination: Secondary | ICD-10-CM | POA: Insufficient documentation

## 2015-06-26 HISTORY — DX: Neoplasm of unspecified behavior of bladder: D49.4

## 2015-06-26 LAB — CBC
HEMATOCRIT: 37.4 % — AB (ref 39.0–52.0)
HEMOGLOBIN: 12.6 g/dL — AB (ref 13.0–17.0)
MCH: 32.6 pg (ref 26.0–34.0)
MCHC: 33.7 g/dL (ref 30.0–36.0)
MCV: 96.9 fL (ref 78.0–100.0)
Platelets: 283 10*3/uL (ref 150–400)
RBC: 3.86 MIL/uL — ABNORMAL LOW (ref 4.22–5.81)
RDW: 13.9 % (ref 11.5–15.5)
WBC: 3.5 10*3/uL — AB (ref 4.0–10.5)

## 2015-06-26 LAB — BASIC METABOLIC PANEL
Anion gap: 6 (ref 5–15)
BUN: 13 mg/dL (ref 6–20)
CO2: 25 mmol/L (ref 22–32)
CREATININE: 0.91 mg/dL (ref 0.61–1.24)
Calcium: 9.3 mg/dL (ref 8.9–10.3)
Chloride: 104 mmol/L (ref 101–111)
GLUCOSE: 93 mg/dL (ref 65–99)
Potassium: 4.8 mmol/L (ref 3.5–5.1)
Sodium: 135 mmol/L (ref 135–145)

## 2015-06-26 NOTE — Patient Instructions (Addendum)
YOUR PROCEDURE IS SCHEDULED ON :  07/05/15  REPORT TO [facility] MAIN ENTRANCE FOLLOW SIGNS TO EAST ELEVATOR - GO TO 3rd FLOOR CHECK IN AT 3 EAST NURSES STATION (SHORT STAY) AT:  9:30 AM  CALL THIS NUMBER IF YOU HAVE PROBLEMS THE MORNING OF SURGERY [phone]  REMEMBER:ONLY 1 PER PERSON MAY GO TO SHORT STAY WITH YOU TO GET READY THE MORNING OF YOUR SURGERY  DO NOT EAT FOOD OR DRINK LIQUIDS AFTER MIDNIGHT  TAKE THESE MEDICINES THE MORNING OF SURGERY:  GENVOYA / OXYCODONE / USE INHALER IF NEEDED ([place])  YOU MAY NOT HAVE ANY METAL ON YOUR BODY INCLUDING HAIR PINS AND PIERCING'S. DO NOT WEAR JEWELRY, MAKEUP, LOTIONS, POWDERS OR PERFUMES. DO NOT WEAR NAIL POLISH. DO NOT SHAVE 48 HRS PRIOR TO SURGERY. MEN MAY SHAVE FACE AND NECK.  DO NOT [name] IS NOT RESPONSIBLE FOR VALUABLES.  CONTACTS, DENTURES OR PARTIALS MAY NOT BE WORN TO SURGERY. LEAVE SUITCASE IN CAR. CAN BE BROUGHT TO ROOM AFTER SURGERY.  PATIENTS DISCHARGED THE DAY OF SURGERY WILL NOT BE ALLOWED TO DRIVE HOME.  PLEASE READ OVER THE FOLLOWING INSTRUCTION SHEETS _________________________________________________________________________________                                          [place] - PREPARING FOR SURGERY  Before surgery, you can play an important role.  Because skin is not sterile, your skin needs to be as free of germs as possible.  You can reduce the number of germs on your skin by washing with CHG (chlorahexidine gluconate) soap before surgery.  CHG is an antiseptic cleaner which kills germs and bonds with the skin to continue killing germs even after washing. Please DO NOT use if you have an allergy to CHG or antibacterial soaps.  If your skin becomes reddened/irritated stop using the CHG and inform your nurse when you arrive at Short Stay. Do not shave (including legs and underarms) for at least 48 hours prior to the first CHG shower.  You  may shave your face. Please follow these instructions carefully:   1.  Shower with CHG Soap the night before surgery and the  morning of Surgery.   2.  If you choose to wash your hair, wash your hair first as usual with your  normal  Shampoo.   3.  After you shampoo, rinse your hair and body thoroughly to remove the  shampoo.                                         4.  Use CHG as you would any other liquid soap.  You can apply chg directly  to the skin and wash . Gently wash with scrungie or clean wascloth    5.  Apply the CHG Soap to your body ONLY FROM THE NECK DOWN.   Do not use on open                           Wound or open sores. Avoid contact with eyes, ears mouth and genitals (private parts).  Genitals (private parts) with your normal soap.              6.  Wash thoroughly, paying special attention to the area where your surgery  will be performed.   7.  Thoroughly rinse your body with warm water from the neck down.   8.  DO NOT shower/wash with your normal soap after using and rinsing off  the CHG Soap .                9.  Pat yourself dry with a clean towel.             10.  Wear clean night clothes to bed after shower             11.  Place clean sheets on your bed the night of your first shower and do not  sleep with pets.  Day of Surgery : Do not apply any lotions/deodorants the morning of surgery.  Please wear clean clothes to the hospital/surgery center.  FAILURE TO FOLLOW THESE INSTRUCTIONS MAY RESULT IN THE CANCELLATION OF YOUR SURGERY    PATIENT SIGNATURE_________________________________  ______________________________________________________________________

## 2015-06-26 NOTE — Telephone Encounter (Signed)
RN contacted the patient to arrange a home visit. Pt stated today will not be a good day since he is doing his preop for his upcoming surgery. Pt stated tomorrow would be fine. Arrange home visit with the patient for tomorrow around lunch time

## 2015-06-27 ENCOUNTER — Ambulatory Visit: Payer: Self-pay | Admitting: *Deleted

## 2015-06-27 VITALS — BP 176/62 | HR 84 | Temp 98.1°F | Resp 19

## 2015-06-27 DIAGNOSIS — B20 Human immunodeficiency virus [HIV] disease: Secondary | ICD-10-CM

## 2015-07-04 ENCOUNTER — Telehealth: Payer: Self-pay | Admitting: *Deleted

## 2015-07-04 NOTE — Patient Instructions (Signed)
Please refer to progress note for details of today's visit 

## 2015-07-04 NOTE — Progress Notes (Signed)
Patient ID: [name], male   DOB: 03-Dec-1955, 59 y.o.   MRN: [id number] RN meet with the patient in his home. Pt stated he has been feeling a lot better and continues to take his medications each day. RN asked the patient if I could see his pillbox and his medications bottles. Pt appeared slightly taken back and changed the subject slightly. After listening to the patient RN asked again if I could see his pillbox and current medications.  Pt left the room and brought back his medications and pillbox. RN noted on the pillbox that the refill date was 10/4 and the patient still had 14 pills left. So in 7 weeks the patient has only taken 2 weeks of medication. RN tactfully asked the patient to explain why he still has 2 weeks of medication left from a prescription that was filled on 05/08/15. Patient stated it took him a couple of days to pick his medication up and he missed a couple of days when he was sick. RN asked the patient if he took his medications today. Pt stated not yet. RN had a heart to heart conversation with the patient and explained to him that it is evident that he is not even close to taking his medications each day. RN asked the patient to be truthful about that and start fresh from there. RN advised the patient that I am not his enemy or here to judge I just want to see him healthy. Pt sat quitely for a second and stated " you know you are right, I have missed a lot more days than what I said" RN stated fine lets start fresh. RN asked the patient if he understands that if he takes his medication each day then the HIV is undetectable meaning it does not show up in his blood? Pt stated "so what your are telling me is if I take this medication everyday and give some blood it will not show in my blood" RN explained to the patient Yes, that is ultimately what undetectable means. Patient stated he has never understood or been explained that in this way. Pt stated he would love to take his medications  everyday then if that means HIV will stop showing up in his blood.

## 2015-07-04 NOTE — Discharge Instructions (Signed)

## 2015-07-04 NOTE — Telephone Encounter (Signed)
Meet with Vito Backers today to discuss mutual patients. Cassandra and I conferenced on Mr. Mongar. I shared that the patient is still struggling with medication adherence along with having a repeat surgery on the 12/01 to rescrap the lining of his bladder to be sure all cancerous cells have been removed. Cassandra added the the patient has another barrier stating Amber(Housing) is going over to the patient's apt complex today ( FYI:he just moved into the apartment a month ago with assistance from [facility]) because the patient has not paid his rent currently and is in jeopardy of getting evicted. Plan at this time is to identify what the patient identifies as his barrier to paying his rent and work from there.

## 2015-07-04 NOTE — H&P (Signed)
Reason For Visit f/u for recent TURBTf   History of Present Illness 4M a history of bladder cancer.     09/2008 - PVP for obstructive voiding symptoms  06/2012 - TURBT - LG T1 TCC, negative reresection  12/2012 - TURBT - reactive tissue, neg biopsy  09/2013 - TURBT - CIS prostatic urethra  10/15 TURBT - negative biospy  05/20/15 TURBT - HG invasive TCC - area of invasion around muscle - likely muscularis propria.    4/15-5/15 - BCG x 6 weeks    Cystoscopy:  3/16 - neg  9/16 - tumor around anterior bladder neck    Cytology:  9/15 - cytology negative    Imaging:  CT, hematuria eval, 2013 - left simple renal cysts  Retrograde pyelogram 05/2014 - normal  RPG 05/2015 - normal    Labs:  PSA 07/14/13 - 0.7    He also has a history of HIV. This is followed by Dr. Michel Bickers, M.D.  Intv: Returns for follow-up of his recent TURBT. Has been recovering reasonably well. He has recently had recurrence of his hematuria as well as dysuria. However, he does say that this is getting better, as time moves out further from his operation.  Denies any fevers or chills. He denies any flank pain. He denies any constitutional symptoms.   Past Medical History Problems  1. History of asthma (Z87.09) 2. History of bladder cancer (Z85.51) 3. History of chronic obstructive lung disease (Z87.09) 4. History of hypertension (Z86.79) 5. History of stomach ulcers (Z87.19) 6. History of Hyponatremia (E87.1)  Surgical History Problems  1. History of Back Surgery 2. History of Bladder Surgery 3. History of Cataract Surgery 4. History of Cystoscopy With Fulguration Minor Lesion (Under 56mm) 5. History of Cystoscopy With Fulguration Small Lesion (5-86mm) 6. History of Hip Replacement  Current Meds 1. Ciprofloxacin HCl - 500 MG Oral Tablet; Take 1 tablet twice daily;  Therapy: 15Sep2016 to (Evaluate:22Sep2016); Last Rx:15Sep2016 Ordered 2. MiraLax Oral Powder;  Therapy:  (Recorded:17Sep2015) to Recorded 3. Oxycodone-Acetaminophen 5-325 MG Oral Tablet;  Therapy: (Recorded:17Sep2015) to Recorded  Allergies Medication  1. Aspirin TABS  Family History Problems  1. Family history of Death of family member : Mother, Father   Mother at age89; heart attackFather; unknown 2. Family history of malignant neoplasm of breast (Z80.3) : Grandmother 3. Family history of myocardial infarction (Z82.49) : Mother  Social History Problems  1. Alcohol use (Z78.9)   2 beers a day 2. Caffeine use (F15.90)   5-6 tea per day 3. Current every day smoker (F17.200)   0.50 packs/day for 17 yrs 4. Disabled 5. Number of children   2 sons and 4 daughters 30. Separated from significant other (Z63.5)  Vitals Vital Signs [Data Includes: Last 1 Day]  Recorded: 27Oct2016 01:52PM  Blood Pressure: 167 / 74 Temperature: 97.9 F Heart Rate: 79  Physical Exam Constitutional: Well nourished and well developed . No acute distress.   Pulmonary: No respiratory distress and normal respiratory rhythm and effort.   Cardiovascular: Heart rate and rhythm are normal . No peripheral edema.    Results/Data Urine [Data Includes: Last 1 Day]   27Oct2016  COLOR AMBER   APPEARANCE CLOUDY   SPECIFIC GRAVITY 1.025   pH 6.0   GLUCOSE NEGATIVE   BILIRUBIN NEGATIVE   KETONE NEGATIVE   BLOOD 3+   PROTEIN 1+   NITRITE NEGATIVE   LEUKOCYTE ESTERASE 2+   SQUAMOUS EPITHELIAL/HPF 6-10 HPF  WBC 20-40 WBC/HPF  RBC >60 RBC/HPF  BACTERIA MANY HPF  CRYSTALS NONE SEEN HPF  CASTS NONE SEEN LPF  Yeast NONE SEEN HPF   Patient urinalysis today demonstrates ongoing microscopic hematuria, likely contaminated specimen although I did send a urine culture today.   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   Plan Health Maintenance  1. UA With REFLEX; [Do Not Release]; Status:Complete;   Done: 27Oct2016  01:43PM Malignant neoplasm of urinary bladder, unspecified site  2. Start: Phenazopyridine HCl - 200 MG Oral Tablet; TAKE 1 TABLET BY MOUTH 3 TIMES A  DAY AS NEEDED FOR PAIN 3. Follow-up Schedule Surgery Office  Follow-up  Status: Hold For - Appointment   Requested for: 27Oct2016  Discussion/Summary Patient has recurrence/progression of his bladder cancer. His path shows HG invasive TCC, although does not appear to clearly invade into the detrusor muscle. I recommended that we resect the area of concern based on the NCCN and AUA guidelines to be sure there is no muscular invasion. If there is clearly no invasion of the TCC into the muscularis propria then we can proceed with BCG. I explained the rationale to the patient who is willing to proceed. I then also renew his prescription for Pyridium and sent his urine to be cultured. The patient has a urinary tract infection we returned treat him prior to his procedure.

## 2015-07-05 ENCOUNTER — Ambulatory Visit (HOSPITAL_COMMUNITY)
Admission: RE | Admit: 2015-07-05 | Discharge: 2015-07-05 | Disposition: A | Discharge status: Home / Self Care | Payer: Medicare Other | Source: Ambulatory Visit | Attending: Urology | Admitting: Urology

## 2015-07-05 ENCOUNTER — Encounter (HOSPITAL_COMMUNITY): Payer: Self-pay | Admitting: Anesthesiology

## 2015-07-05 ENCOUNTER — Encounter (HOSPITAL_COMMUNITY)
Admission: RE | Disposition: A | Discharge status: Home / Self Care | Payer: Self-pay | Source: Ambulatory Visit | Attending: Urology

## 2015-07-05 ENCOUNTER — Ambulatory Visit (HOSPITAL_COMMUNITY): Payer: Medicare Other | Admitting: Certified Registered Nurse Anesthetist

## 2015-07-05 DIAGNOSIS — C675 Malignant neoplasm of bladder neck: Secondary | ICD-10-CM | POA: Diagnosis not present

## 2015-07-05 DIAGNOSIS — R300 Dysuria: Secondary | ICD-10-CM | POA: Diagnosis not present

## 2015-07-05 DIAGNOSIS — J4521 Mild intermittent asthma with (acute) exacerbation: Secondary | ICD-10-CM

## 2015-07-05 DIAGNOSIS — J45909 Unspecified asthma, uncomplicated: Secondary | ICD-10-CM | POA: Insufficient documentation

## 2015-07-05 DIAGNOSIS — I10 Essential (primary) hypertension: Secondary | ICD-10-CM | POA: Insufficient documentation

## 2015-07-05 DIAGNOSIS — Z79899 Other long term (current) drug therapy: Secondary | ICD-10-CM | POA: Diagnosis not present

## 2015-07-05 DIAGNOSIS — J449 Chronic obstructive pulmonary disease, unspecified: Secondary | ICD-10-CM | POA: Insufficient documentation

## 2015-07-05 DIAGNOSIS — C670 Malignant neoplasm of trigone of bladder: Secondary | ICD-10-CM

## 2015-07-05 DIAGNOSIS — Z8249 Family history of ischemic heart disease and other diseases of the circulatory system: Secondary | ICD-10-CM | POA: Diagnosis not present

## 2015-07-05 DIAGNOSIS — F329 Major depressive disorder, single episode, unspecified: Secondary | ICD-10-CM | POA: Diagnosis not present

## 2015-07-05 DIAGNOSIS — B20 Human immunodeficiency virus [HIV] disease: Secondary | ICD-10-CM | POA: Insufficient documentation

## 2015-07-05 DIAGNOSIS — F17200 Nicotine dependence, unspecified, uncomplicated: Secondary | ICD-10-CM | POA: Insufficient documentation

## 2015-07-05 DIAGNOSIS — D494 Neoplasm of unspecified behavior of bladder: Secondary | ICD-10-CM | POA: Diagnosis not present

## 2015-07-05 HISTORY — PX: TRANSURETHRAL RESECTION OF BLADDER TUMOR WITH GYRUS (TURBT-GYRUS): SHX6458

## 2015-07-05 SURGERY — TRANSURETHRAL RESECTION OF BLADDER TUMOR WITH GYRUS (TURBT-GYRUS)
Anesthesia: General | Site: Bladder

## 2015-07-05 MED ORDER — BELLADONNA ALKALOIDS-OPIUM 16.2-60 MG RE SUPP
RECTAL | Status: DC | PRN
  Administered 2015-07-05: 1 via RECTAL

## 2015-07-05 MED ORDER — FENTANYL CITRATE (PF) 250 MCG/5ML IJ SOLN
INTRAMUSCULAR | Status: AC
  Filled 2015-07-05: qty 5

## 2015-07-05 MED ORDER — ALBUTEROL SULFATE (2.5 MG/3ML) 0.083% IN NEBU: 2.5000 mg | INHALATION_SOLUTION | Freq: Four times a day (QID) | RESPIRATORY_TRACT | Status: DC | PRN

## 2015-07-05 MED ORDER — PHENAZOPYRIDINE HCL 200 MG PO TABS: 200.0000 mg | ORAL_TABLET | Freq: Three times a day (TID) | ORAL | Status: DC | PRN

## 2015-07-05 MED ORDER — CIPROFLOXACIN IN D5W 400 MG/200ML IV SOLN
400.0000 mg | INTRAVENOUS | Status: AC
  Administered 2015-07-05: 400 mg via INTRAVENOUS

## 2015-07-05 MED ORDER — LIDOCAINE HCL (CARDIAC) 20 MG/ML IV SOLN
INTRAVENOUS | Status: DC | PRN
  Administered 2015-07-05: 80 mg via INTRAVENOUS

## 2015-07-05 MED ORDER — BELLADONNA ALKALOIDS-OPIUM 16.2-60 MG RE SUPP
RECTAL | Status: AC
  Filled 2015-07-05: qty 1

## 2015-07-05 MED ORDER — LIDOCAINE HCL (CARDIAC) 20 MG/ML IV SOLN
INTRAVENOUS | Status: AC
  Filled 2015-07-05: qty 5

## 2015-07-05 MED ORDER — ONDANSETRON HCL 4 MG/2ML IJ SOLN
INTRAMUSCULAR | Status: DC | PRN
  Administered 2015-07-05: 4 mg via INTRAVENOUS

## 2015-07-05 MED ORDER — MIDAZOLAM HCL 5 MG/5ML IJ SOLN
INTRAMUSCULAR | Status: DC | PRN
  Administered 2015-07-05: 2 mg via INTRAVENOUS

## 2015-07-05 MED ORDER — OXYCODONE-ACETAMINOPHEN 5-325 MG PO TABS: 1.0000 | ORAL_TABLET | Freq: Four times a day (QID) | ORAL | Status: DC | PRN

## 2015-07-05 MED ORDER — EPHEDRINE SULFATE 50 MG/ML IJ SOLN
INTRAMUSCULAR | Status: DC | PRN
  Administered 2015-07-05: 5 mg via INTRAVENOUS

## 2015-07-05 MED ORDER — PROMETHAZINE HCL 25 MG/ML IJ SOLN: 6.2500 mg | INTRAMUSCULAR | Status: DC | PRN

## 2015-07-05 MED ORDER — SODIUM CHLORIDE 0.9 % IR SOLN
Status: DC | PRN
  Administered 2015-07-05: 1000 mL
  Administered 2015-07-05: 6000 mL via INTRAVESICAL

## 2015-07-05 MED ORDER — LIDOCAINE HCL 2 % EX GEL
CUTANEOUS | Status: AC
  Filled 2015-07-05: qty 5

## 2015-07-05 MED ORDER — PHENAZOPYRIDINE HCL 200 MG PO TABS
200.0000 mg | ORAL_TABLET | Freq: Once | ORAL | Status: AC
  Administered 2015-07-05: 200 mg via ORAL
  Filled 2015-07-05: qty 1

## 2015-07-05 MED ORDER — MIDAZOLAM HCL 2 MG/2ML IJ SOLN
INTRAMUSCULAR | Status: AC
  Filled 2015-07-05: qty 2

## 2015-07-05 MED ORDER — ONDANSETRON HCL 4 MG/2ML IJ SOLN
INTRAMUSCULAR | Status: AC
  Filled 2015-07-05: qty 2

## 2015-07-05 MED ORDER — LIDOCAINE HCL 2 % EX GEL
CUTANEOUS | Status: DC | PRN
  Administered 2015-07-05: 1

## 2015-07-05 MED ORDER — LACTATED RINGERS IV SOLN
INTRAVENOUS | Status: DC | PRN
  Administered 2015-07-05: 11:00:00 via INTRAVENOUS

## 2015-07-05 MED ORDER — FENTANYL CITRATE (PF) 100 MCG/2ML IJ SOLN
INTRAMUSCULAR | Status: DC | PRN
  Administered 2015-07-05 (×2): 50 ug via INTRAVENOUS

## 2015-07-05 MED ORDER — PROPOFOL 10 MG/ML IV BOLUS
INTRAVENOUS | Status: DC | PRN
  Administered 2015-07-05: 150 mg via INTRAVENOUS

## 2015-07-05 MED ORDER — CIPROFLOXACIN IN D5W 400 MG/200ML IV SOLN
INTRAVENOUS | Status: AC
  Filled 2015-07-05: qty 200

## 2015-07-05 MED ORDER — PROPOFOL 10 MG/ML IV BOLUS
INTRAVENOUS | Status: AC
  Filled 2015-07-05: qty 20

## 2015-07-05 MED ORDER — PHENYLEPHRINE HCL 10 MG/ML IJ SOLN
INTRAMUSCULAR | Status: DC | PRN
  Administered 2015-07-05 (×2): 80 ug via INTRAVENOUS

## 2015-07-05 MED ORDER — FENTANYL CITRATE (PF) 100 MCG/2ML IJ SOLN: 25.0000 ug | INTRAMUSCULAR | Status: DC | PRN

## 2015-07-05 SURGICAL SUPPLY — 12 items
BAG URINE DRAINAGE (UROLOGICAL SUPPLIES) IMPLANT
BAG URO CATCHER STRL LF (DRAPE) ×3 IMPLANT
EVACUATOR MICROVAS BLADDER (UROLOGICAL SUPPLIES) IMPLANT
GLOVE BIOGEL M STRL SZ7.5 (GLOVE) ×3 IMPLANT
GOWN STRL REUS W/TWL XL LVL3 (GOWN DISPOSABLE) ×3 IMPLANT
KIT ASPIRATION TUBING (SET/KITS/TRAYS/PACK) IMPLANT
LOOP CUT BIPOLAR 24F LRG (ELECTROSURGICAL) ×3 IMPLANT
MANIFOLD NEPTUNE II (INSTRUMENTS) ×3 IMPLANT
PACK CYSTO (CUSTOM PROCEDURE TRAY) ×3 IMPLANT
SYRINGE IRR TOOMEY STRL 70CC (SYRINGE) IMPLANT
TUBING CONNECTING 10 (TUBING) ×2 IMPLANT
TUBING CONNECTING 10' (TUBING) ×1

## 2015-07-05 NOTE — Op Note (Signed)
Preoperative diagnosis:  HG invasive TCC - area of invasion around muscle   Postoperative diagnosis:  1. same   Procedure: 1. Transurethral Re-resection of bladder tumor around bladder neck -  2cm  Surgeon: Ardis Hughs, MD  Anesthesia: General  Complications: None  Intraoperative findings: Area around the right side of the patient's bladder neck was re-resected. There were small areas of papillary lesions which would be tumor regrowth versus inflammation.  EBL: Minimal  Specimens: None  Indication: [name] is a 59 y.o. patient with history of high-grade invasive bladder cancer, who presents to the operating room today for reresection of his tumor which was done 6 weeks prior..  After reviewing the management options for treatment, he elected to proceed with the above surgical procedure(s). We have discussed the potential benefits and risks of the procedure, side effects of the proposed treatment, the likelihood of the patient achieving the goals of the procedure, and any potential problems that might occur during the procedure or recuperation. Informed consent has been obtained.  Description of procedure:  The patient was taken to the operating room and general anesthesia was induced.  The patient was placed in the dorsal lithotomy position, prepped and draped in the usual sterile fashion, and preoperative antibiotics were administered. A preoperative time-out was performed.   A 30 cystoscope was then gently passed to the patient's urethra and into the bladder under visual guidance. A 360 cystoscopic valuation was performed with no significant abnormalities noted. There was an area around the right bladder neck, side of the previous resection, were there was some edema/tumor regrowth.  The cystoscope was then removed and reintroduced with a 26 French resectoscope sheath using the visual obturator. The operator was then exchanged for the loop cautery, and the area was  re-resected along with a small area in the intra-prostatic urethra. Hemostasis was then meticulously achieved.  The bladder was subsequently emptied along with all the bladder chips/specimen. A B and O suppository was then placed into the patient's rectum. Urethral lidocaine jelly was also inserted. The patient was subsequently extubated and returned to PACU in stable condition.   Ardis Hughs, M.D.

## 2015-07-05 NOTE — Interval H&P Note (Signed)
History and Physical Interval Note:  07/05/2015 11:17 AM  [name]  has presented today for surgery, with the diagnosis of BLADDER TUMOR   The various methods of treatment have been discussed with the patient and family. After consideration of risks, benefits and other options for treatment, the patient has consented to  Procedure(s): RE RESECTION OF BLADDER TUMOR (TURBT) (N/A) as a surgical intervention .  The patient's history has been reviewed, patient examined, no change in status, stable for surgery.  I have reviewed the patient's chart and labs.  Questions were answered to the patient's satisfaction.     Louis Meckel W

## 2015-07-05 NOTE — Anesthesia Procedure Notes (Signed)
Procedure Name: LMA Insertion Date/Time: 07/05/2015 11:27 AM Performed by: Montel Clock Pre-anesthesia Checklist: Patient identified, Emergency Drugs available, Suction available, Patient being monitored and Timeout performed Patient Re-evaluated:Patient Re-evaluated prior to inductionOxygen Delivery Method: Circle system utilized Preoxygenation: Pre-oxygenation with 100% oxygen Intubation Type: IV induction Ventilation: Mask ventilation without difficulty LMA: LMA with gastric port inserted LMA Size: 4.0 Number of attempts: 1 Dental Injury: Teeth and Oropharynx as per pre-operative assessment

## 2015-07-05 NOTE — Anesthesia Preprocedure Evaluation (Addendum)
Anesthesia Evaluation  Patient identified by MRN, date of birth, ID band Patient awake    Reviewed: Allergy & Precautions, NPO status , Patient's Chart, lab work & pertinent test results  Airway Mallampati: II  TM Distance: >3 FB Neck ROM: Full    Dental no notable dental hx.    Pulmonary asthma , COPD,  COPD inhaler, Current Smoker,    Pulmonary exam normal breath sounds clear to auscultation       Cardiovascular hypertension, Pt. on medications Normal cardiovascular exam Rhythm:Regular Rate:Normal     Neuro/Psych PSYCHIATRIC DISORDERS Depression negative neurological ROS     GI/Hepatic negative GI ROS, Neg liver ROS,   Endo/Other  negative endocrine ROS  Renal/GU negative Renal ROS  negative genitourinary   Musculoskeletal negative musculoskeletal ROS (+)   Abdominal   Peds negative pediatric ROS (+)  Hematology  (+) HIV,   Anesthesia Other Findings   Reproductive/Obstetrics negative OB ROS                            Anesthesia Physical Anesthesia Plan  ASA: III  Anesthesia Plan: General   Post-op Pain Management:    Induction: Intravenous  Airway Management Planned: LMA  Additional Equipment:   Intra-op Plan:   Post-operative Plan: Extubation in OR  Informed Consent: I have reviewed the patients History and Physical, chart, labs and discussed the procedure including the risks, benefits and alternatives for the proposed anesthesia with the patient or authorized representative who has indicated his/her understanding and acceptance.   Dental advisory given  Plan Discussed with: CRNA  Anesthesia Plan Comments:         Anesthesia Quick Evaluation

## 2015-07-05 NOTE — Interval H&P Note (Deleted)
History and Physical Interval Note:  07/05/2015 11:17 AM  [name]  has presented today for surgery, with the diagnosis of BLADDER TUMOR   The various methods of treatment have been discussed with the patient and family. After consideration of risks, benefits and other options for treatment, the patient has consented to  Procedure(s): RE RESECTION OF BLADDER TUMOR (TURBT) (N/A) as a surgical intervention .  The patient's history has been reviewed, patient examined, no change in status, stable for surgery.  I have reviewed the patient's chart and labs.  Questions were answered to the patient's satisfaction.     Louis Meckel W

## 2015-07-05 NOTE — Transfer of Care (Signed)
Immediate Anesthesia Transfer of Care Note  Patient: [name]  Procedure(s) Performed: Procedure(s): RE RESECTION OF BLADDER TUMOR (TURBT) RIGHT BLADDER NECK (N/A)  Patient Location: PACU  Anesthesia Type:General  Level of Consciousness:  sedated, patient cooperative and responds to stimulation  Airway & Oxygen Therapy:Patient Spontanous Breathing and Patient connected to face mask oxgen  Post-op Assessment:  Report given to PACU RN and Post -op Vital signs reviewed and stable  Post vital signs:  Reviewed and stable  Last Vitals:  Filed Vitals:   07/05/15 0903  BP: 156/60  Pulse: 62  Temp: 36.4 C  Resp: 18    Complications: No apparent anesthesia complications

## 2015-07-05 NOTE — Anesthesia Postprocedure Evaluation (Signed)
Anesthesia Post Note  Patient: [name]  Procedure(s) Performed: Procedure(s) (LRB): RE RESECTION OF BLADDER TUMOR (TURBT) RIGHT BLADDER NECK (N/A)  Patient location during evaluation: PACU Anesthesia Type: General Level of consciousness: awake and alert Pain management: pain level controlled Vital Signs Assessment: post-procedure vital signs reviewed and stable Respiratory status: spontaneous breathing, nonlabored ventilation, respiratory function stable and patient connected to nasal cannula oxygen Cardiovascular status: blood pressure returned to baseline and stable Postop Assessment: no signs of nausea or vomiting Anesthetic complications: no    Last Vitals:  Filed Vitals:   07/05/15 1254 07/05/15 1336  BP: 150/60 138/60  Pulse: 65 68  Temp: 36.3 C   Resp:  20    Last Pain:  Filed Vitals:   07/05/15 1337  PainSc: 2                  Virlan Kempker J

## 2015-07-12 ENCOUNTER — Other Ambulatory Visit: Payer: Medicare Other

## 2015-07-12 ENCOUNTER — Telehealth: Payer: Self-pay | Admitting: *Deleted

## 2015-07-13 ENCOUNTER — Ambulatory Visit: Payer: Medicare Other | Admitting: *Deleted

## 2015-07-16 ENCOUNTER — Other Ambulatory Visit: Payer: Medicare Other

## 2015-07-16 DIAGNOSIS — B20 Human immunodeficiency virus [HIV] disease: Secondary | ICD-10-CM | POA: Diagnosis not present

## 2015-07-17 LAB — T-HELPER CELL (CD4) - (RCID CLINIC ONLY)
CD4 % Helper T Cell: 38 % (ref 33–55)
CD4 T Cell Abs: 740 /uL (ref 400–2700)

## 2015-07-17 LAB — HIV-1 RNA QUANT-NO REFLEX-BLD: HIV-1 RNA Quant, Log: <1.3 Log copies/mL (ref <1.30)

## 2015-07-23 DIAGNOSIS — N3000 Acute cystitis without hematuria: Secondary | ICD-10-CM | POA: Diagnosis not present

## 2015-07-26 ENCOUNTER — Encounter: Payer: Medicare Other | Admitting: *Deleted

## 2015-07-26 ENCOUNTER — Ambulatory Visit (INDEPENDENT_AMBULATORY_CARE_PROVIDER_SITE_OTHER): Payer: Medicare Other | Admitting: Internal Medicine

## 2015-07-26 ENCOUNTER — Telehealth: Payer: Self-pay | Admitting: *Deleted

## 2015-07-26 VITALS — BP 159/78 | HR 74 | Temp 99.1°F | Ht 64.0 in | Wt 137.0 lb

## 2015-07-26 DIAGNOSIS — B20 Human immunodeficiency virus [HIV] disease: Secondary | ICD-10-CM

## 2015-07-26 DIAGNOSIS — Z79899 Other long term (current) drug therapy: Secondary | ICD-10-CM

## 2015-07-26 NOTE — Progress Notes (Signed)
Patient Active Problem List   Diagnosis Date Noted  . HIV disease ([place]) 04/18/2015    Priority: High  . Dysuria 05/29/2015    Priority: Medium  . Depression 04/19/2015  . 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 HTN (hypertension) 03/30/2015  . Asthma, chronic 03/30/2015  . Malignant neoplasm of bladder ([place]) 03/30/2015    Patient's Medications  New Prescriptions   No medications on file  Previous Medications   ALBUTEROL (PROVENTIL HFA;VENTOLIN HFA) 108 (90 BASE) MCG/ACT INHALER    Inhale 2 puffs into the lungs every 6 (six) hours as needed for wheezing or shortness of breath.   ALBUTEROL (PROVENTIL) (2.5 MG/3ML) 0.083% NEBULIZER SOLUTION    Take 3 mLs (2.5 mg total) by nebulization every 6 (six) hours as needed for wheezing or shortness of breath.   AMLODIPINE (NORVASC) 5 MG TABLET    TAKE 1 TABLET (5 MG TOTAL) BY MOUTH DAILY.   ELVITEGRAVIR-COBICISTAT-EMTRICITABINE-TENOFOVIR (GENVOYA) 150-150-200-10 MG TABS TABLET    Take 1 tablet by mouth daily with breakfast.   OXYCODONE-ACETAMINOPHEN (PERCOCET/ROXICET) 5-325 MG TABLET    Take 1-2 tablets by mouth every 6 (six) hours as needed for severe pain.   PHENAZOPYRIDINE (PYRIDIUM) 200 MG TABLET    Take 1 tablet (200 mg total) by mouth 3 (three) times daily as needed for pain.  Modified Medications   No medications on file  Discontinued Medications   No medications on file    Subjective: [name] is in for his routine HIV follow-up visit. He states that he is feeling better. He tells me that he does not believe it would do any good he were to complain. He underwent cystoscopy several weeks ago and was found to have recurrent cancer. He scheduled to restart chemotherapy next month. He has had trouble tolerating chemotherapy in the past but he has a good attitude about it and states that he knows it is the best option for him now. He takes his Genvoya with his blood pressure medication each evening right after eating his  evening meal. He has no problems tolerating it and has not missed any doses.   Review of Systems: Review of Systems  Constitutional: Negative for fever, chills, weight loss, malaise/fatigue and diaphoresis.  HENT: Negative for sore throat.   Respiratory: Negative for cough, sputum production and shortness of breath.   Cardiovascular: Negative for chest pain.  Gastrointestinal: Negative for nausea, vomiting and diarrhea.  Genitourinary: Positive for dysuria and frequency.  Musculoskeletal: Negative for joint pain.  Skin: Negative for rash.  Psychiatric/Behavioral: Negative for depression and substance abuse. The patient is not nervous/anxious.     Past Medical History  Diagnosis Date  . Hypertension   . Nocturia   . Bladder cancer ([facility])     and prostatic urethra cancer  HX TURBT'S IN [place], [place]  WITH INSTILLATION CHEMO TX'S  . Asthma with COPD ([place])   . History of transfusion   . HIV disease ([place])     under care of [facility]  . Bladder tumor     Social History  Substance Use Topics  . Smoking status: Current Every Day Smoker -- 0.50 packs/day for 17 years    Types: Cigarettes  . Smokeless tobacco: Never Used  . Alcohol Use: 2.4 oz/week    4 Cans of beer per week     Comment: occasional    Family History  Problem Relation Age of Onset  . Cancer  Mother     Gastric Cancer  . Hypertension Mother   . Heart disease Mother   . Cancer Maternal Grandmother     breast cancer     Allergies  Allergen Reactions  . Aspirin Shortness Of Breath, Nausea And Vomiting and Swelling    Objective:  Filed Vitals:   07/26/15 1513 07/26/15 1517  BP:  159/78  Pulse:  74  Temp: 99.1 F (37.3 C)   TempSrc: Oral   Height: 5\' 4"  (1.626 m)   Weight: 137 lb (62.143 kg)    Body mass index is 23.5 kg/(m^2).  Physical Exam  Constitutional: He is oriented to person, place, and time.  He is smiling and in good spirits.  HENT:  Mouth/Throat: No  oropharyngeal exudate.  Eyes: Conjunctivae are normal.  Cardiovascular: Normal rate and regular rhythm.   No murmur heard. Pulmonary/Chest: Breath sounds normal.  Abdominal: Soft. He exhibits no mass. There is no tenderness.  Musculoskeletal: Normal range of motion.  Neurological: He is alert and oriented to person, place, and time.  Skin: No rash noted.  Psychiatric: Mood and affect normal.    Lab Results Lab Results  Component Value Date   WBC 3.5* 06/26/2015   HGB 12.6* 06/26/2015   HCT 37.4* 06/26/2015   MCV 96.9 06/26/2015   PLT 283 06/26/2015    Lab Results  Component Value Date   CREATININE 0.91 06/26/2015   BUN 13 06/26/2015   NA 135 06/26/2015   K 4.8 06/26/2015   CL 104 06/26/2015   CO2 25 06/26/2015    Lab Results  Component Value Date   ALT 11 03/20/2015   AST 38* 03/20/2015   ALKPHOS 115 03/20/2015   BILITOT 0.4 03/20/2015    Lab Results  Component Value Date   CHOL 124* 03/20/2015   HDL 70 03/20/2015   LDLCALC 44 03/20/2015   TRIG 48 03/20/2015   CHOLHDL 1.8 03/20/2015    Lab Results HIV 1 RNA QUANT (copies/mL)  Date Value  07/16/2015 <20  04/19/2015 172*  03/20/2015 8903*   CD4 T CELL ABS (/uL)  Date Value  07/16/2015 740  03/20/2015 620      Problem List Items Addressed This Visit      High   HIV disease ([place])    His HIV infection is now under excellent control. I will continue Genvoya. He has less depressed and his adherence has been very good. He did meet with Rolena Infante, our behavioral health counselor today. He is also working with our Facilities manager, [name]. He will follow-up here in 3 months.      Relevant Orders   T-helper cell (CD4)- ([name] clinic only)   HIV 1 RNA quant-no reflex-bld   CBC   Comprehensive metabolic panel   Lipid panel   RPR    Other Visit Diagnoses    Encounter for long-term (current) use of medications    -  Primary    Relevant Orders    Lipid panel         Michel Bickers,  MD [facility] for [place] [phone] pager   [phone] cell 07/26/2015, 3:45 PM

## 2015-07-26 NOTE — Telephone Encounter (Signed)
RN contacted the patient to arrange a home visit. Patient stated this would not be good week but next Monday would work for him. Patient stated he plans to keep his appt with [name] this afternoon. Patient also stated he is starting Chemotherapy on 08/13/2015. RN encouraged the patient by stating "I am so proud of you." During my last visit with the patient we discussed how taking his medication keeps the virus for showing in his blood. Patient is now undetectable with a increase in his viral load. Next planned visit is for next week.

## 2015-07-26 NOTE — Assessment & Plan Note (Signed)
His HIV infection is now under excellent control. I will continue Genvoya. He has less depressed and his adherence has been very good. He did meet with Rolena Infante, our behavioral health counselor today. He is also working with our Facilities manager, [name]. He will follow-up here in 3 months.

## 2015-07-26 NOTE — BH Specialist Note (Signed)
Counselor met briefly with patient today.  Counselor found patient to be oriented times four with good affect and dress.  Patient was alert and talkative. Patient indicated that he was doing well and the doctor had just communicated to him that he was suppressed and his counts were great.  Patient was very happy and leased with his news.  Counselor inquired with patient as to how his cancer was doing and if he was ok mentally.  Patient shared that it was rough physically but knew he had to stay the course in order to stay alive and healthy.  Counselor provided support and encouragement accordingly.   Rolena Infante, MA [name]

## 2015-08-07 ENCOUNTER — Other Ambulatory Visit: Payer: Self-pay | Admitting: Internal Medicine

## 2015-08-07 DIAGNOSIS — R0602 Shortness of breath: Secondary | ICD-10-CM

## 2015-08-13 ENCOUNTER — Telehealth: Payer: Self-pay | Admitting: *Deleted

## 2015-08-13 ENCOUNTER — Ambulatory Visit: Payer: Self-pay | Admitting: *Deleted

## 2015-08-13 VITALS — BP 160/58 | HR 64 | Temp 96.9°F | Resp 18

## 2015-08-13 DIAGNOSIS — B20 Human immunodeficiency virus [HIV] disease: Secondary | ICD-10-CM

## 2015-08-13 DIAGNOSIS — C670 Malignant neoplasm of trigone of bladder: Secondary | ICD-10-CM

## 2015-08-13 NOTE — Progress Notes (Addendum)
Patient ID: [name], male   DOB: 03-Apr-1956, 60 y.o.   MRN: [id number]   Order received on 08/13/15 by Dr. Michel Bickers to perform a late recertifcation to re-evaluate patient for [facility]). Visit was made with the patient on that day. Patient was re-evaluated on 08/13/2015 for [facility].  Patient was consented to care at this time.    Frequency / Duration of [facility] visits: Effective 08/13/2015:  74mo2, 37mo1, 3PRN's for complications with disease process/progression, medication changes or concerns  [facility] will assess for learning needs related to diagnosis and treatment regimen, provide education as needed, fill pill box if needed, address any barriers which may be preventing medication compliance, and communicating with care team including physician and case manager.  Individualized Plan Of Care, Certification Period of 08/13/2015 to 11/11/2015 a. Type of service(s) and care to be delivered: [facility] Nurse b. Frequency and duration of service: Effective: 08/13/2015: 25mo2, 76mo1, 3 prns for complications with disease process/progression, medication changes or concerns c. Activity restrictions: Pt may be up as tolerated and can safely ambulate without the need for a assistive device. Pt wears glasses d. Safety Measures: Standard Precautions/Infection Control e. Service Objectives and Goals:Service Objectives are to assist the pt with HIV medication regimen adherence and staying in care with the Infectious Disease Clinic by identifying barriers to care. RN will address the barriers that are identified by the patient.  Pt identified a goal of getting over his Bladder Cancer and would like to be in a relationship with a male who is also positive. pt does not feel comfortable disclosing his status. Current RN goal for this patient is to offer a lot of emotional support and encouragement. RN would like to accompany the patient to his Clinic appts,  with his permission, to ensure he has a understanding of information provided.  f. Equipment required: No additional equipment needs at this time g. Functional Limitations: Vision. Pt has corrective glasses that he wears h. Rehabilitation potential: Guarded i. Diet and Nutritional Needs: Regular Diet j. Medications and treatments: Medications have been reconciled and reviewed and are a part of EPIC electronic file k. Specific therapies if needed: RN l. Pertinent diagnoses: Hypertension, Asthma, Bladder Cancer, Depression m. Expected outcome: Guarded n.    RN meet with the patient at his home for a recertification visit.  Patient was attempting to reschedule his Chemotherapy appt since they are not accepting patient's this AM due to the  [place] weather. Patient spent the entire visit attempting to reschedule his chemo appt and after a while was able to get through. Patient was instructed to just start his first round of chemotherapy on the 16th of January instead of the 9th of January.   RN assessed the patient physically with vitals stable and denies any pain. RN assisted the patient by calling Medicaid transportation to schedule a pick up for his upcoming Chemo appt's . After staying on hold for over 30 minutes, RN left a message for the patient on the transportation line requesting a call back to the patient to arrange transportation. RN also [facility] [address], and requested a refill of the patient's Genvoya. [facility] stated the medications will be ready for pick up today along with his proventil. [facility] not [facility] so the patient agreed to pick the medications up today. RN informed the patient that he may want to consider transferring to [facility] on [address] to take  advantage of the delivery service and auto refill. Patient stated that may be a great idea but we mutually agreed to get his medications now and then make changes.    Patient stated during his last visit with [name] he was told that his HIV is gone and he wondered why he needed to continue to take the medications. RN explained to the patient the medication weakens the virus so it does not show in his blood but if he stops taking the medication the virus will show in his blood again. Pt verbalized understanding but he did not appear convinced. RN asked the patient if he had been taking his medications each day. Patient stated he had, RN explained to the patient that the medication is the reason his HIV is not showing up. Patient appeared a little more convinced and determined to get his medication.   Plan is to visit the patient again next week.

## 2015-08-13 NOTE — Patient Instructions (Signed)
Please refer to progress note for details of today's visit 

## 2015-08-20 ENCOUNTER — Telehealth: Payer: Self-pay | Admitting: *Deleted

## 2015-08-20 DIAGNOSIS — Z0000 Encounter for general adult medical examination without abnormal findings: Secondary | ICD-10-CM | POA: Diagnosis not present

## 2015-08-20 DIAGNOSIS — N3000 Acute cystitis without hematuria: Secondary | ICD-10-CM | POA: Diagnosis not present

## 2015-08-20 NOTE — Telephone Encounter (Signed)
RN contacted the patient to arrange out Home Visit for this week. RN arranged a home visit with the patient for this Wednesday

## 2015-08-22 ENCOUNTER — Ambulatory Visit: Payer: Self-pay | Admitting: *Deleted

## 2015-08-22 VITALS — BP 180/78 | HR 92 | Temp 98.2°F | Resp 18

## 2015-08-22 DIAGNOSIS — B20 Human immunodeficiency virus [HIV] disease: Secondary | ICD-10-CM

## 2015-08-22 NOTE — Progress Notes (Signed)
Patient ID: [name], male   DOB: May 21, 1956, 60 y.o.   MRN: [id number] Patient stated it took him a while to get his medication and he just got it the other day. During our last visit was when his medication was called in. RN informed the patient that it may be a good idea to transfer her prescription to a pharmacy that will deliver. Plan at this time is to contact [facility] for a transfer of the patient's prescriptions. RN will also request autorefill and delivery of medications. Patient states he is feeling a lot better and his BP may be up because he just enjoyed a beer with his brother. Patient stated he is not a heavy drinker but he likes a occasional beer. Plan at this time is to arrange for monthly autofill and delivery of the patient's medications through [facility] on [address]. RN informed that patient that he will have to be confirm his address each month before his medications will be mailed. Patient's demographics and request for transfer have been made to [facility]

## 2015-08-22 NOTE — Patient Instructions (Signed)
RN instructed the patient that he needs to increase his water consumption and cranberry juice to help flush his kidneys and bladder and decrease the risk of infection

## 2015-08-24 NOTE — Telephone Encounter (Signed)
Patient ID: [name], male   DOB: [birth year], 61 y.o.   MRN: [id number]   Order received on 08/13/15 by Dr. Michel Bickers to perform a late recertifcation to re-evaluate patient for [facility]). Visit was made with the patient on that day. Patient was re-evaluated on 08/13/2015 for [facility].  Patient was consented to care at this time.    Frequency / Duration of [facility] visits: Effective 08/13/2015:  72mo2, 8mo1, 3PRN's for complications with disease process/progression, medication changes or concerns  [facility] will assess for learning needs related to diagnosis and treatment regimen, provide education as needed, fill pill box if needed, address any barriers which may be preventing medication compliance, and communicating with care team including physician and case manager.  Individualized Plan Of Care, Certification Period of 08/13/2015 to 11/11/2015 a. Type of service(s) and care to be delivered: [facility] Nurse b. Frequency and duration of service: Effective: 08/13/2015: 31mo2, 8mo1, 3 prns for complications with disease process/progression, medication changes or concerns c. Activity restrictions: Pt may be up as tolerated and can safely ambulate without the need for a assistive device. Pt wears glasses d. Safety Measures: Standard Precautions/Infection Control e. Service Objectives and Goals:Service Objectives are to assist the pt with HIV medication regimen adherence and staying in care with the Infectious Disease Clinic by identifying barriers to care. RN will address the barriers that are identified by the patient.  Pt identified a goal of getting over his Bladder Cancer and would like to be in a relationship with a male who is also positive. pt does not feel comfortable disclosing his status. Current RN goal for this patient is to offer a lot of emotional support and encouragement. RN would like to accompany the patient to his Clinic appts,  with his permission, to ensure he has a understanding of information provided.  f. Equipment required: No additional equipment needs at this time g. Functional Limitations: Vision. Pt has corrective glasses that he wears h. Rehabilitation potential: Guarded i. Diet and Nutritional Needs: Regular Diet j. Medications and treatments: Medications have been reconciled and reviewed and are a part of EPIC electronic file k. Specific therapies if needed: RN l. Pertinent diagnoses: Hypertension, Asthma, Bladder Cancer, Depression m. Expected outcome: Guarded n.    RN meet with the patient at his home for a recertification visit.  Patient was attempting to reschedule his Chemotherapy appt since they are not accepting patient's this AM due to the  [place] weather. Patient spent the entire visit attempting to reschedule his chemo appt and after a while was able to get through. Patient was instructed to just start his first round of chemotherapy on the 16th of January instead of the 9th of January.   RN assessed the patient physically with vitals stable and denies any pain. RN assisted the patient by calling Medicaid transportation to schedule a pick up for his upcoming Chemo appt's . After staying on hold for over 30 minutes, RN left a message for the patient on the transportation line requesting a call back to the patient to arrange transportation. RN also [facility] [address], and requested a refill of the patient's Genvoya. [facility] stated the medications will be ready for pick up today along with his proventil. [facility] does not [facility] so the patient agreed to pick the medications up today. RN informed the patient that he may want to consider transferring to [facility] on [address] to take  advantage of the delivery service and auto refill. Patient stated that may be a great idea but we mutually agreed to get his medications now and then make changes.    Patient stated during his last visit with [name] he was told that his HIV is gone and he wondered why he needed to continue to take the medications. RN explained to the patient the medication weakens the virus so it does not show in his blood but if he stops taking the medication the virus will show in his blood again. Pt verbalized understanding but he did not appear convinced. RN asked the patient if he had been taking his medications each day. Patient stated he had, RN explained to the patient that the medication is the reason his HIV is not showing up. Patient appeared a little more convinced and determined to get his medication.   Plan is to visit the patient again next week.

## 2015-08-27 DIAGNOSIS — C675 Malignant neoplasm of bladder neck: Secondary | ICD-10-CM | POA: Diagnosis not present

## 2015-08-27 DIAGNOSIS — C670 Malignant neoplasm of trigone of bladder: Secondary | ICD-10-CM | POA: Diagnosis not present

## 2015-08-27 DIAGNOSIS — Z5111 Encounter for antineoplastic chemotherapy: Secondary | ICD-10-CM | POA: Diagnosis not present

## 2015-08-27 DIAGNOSIS — N3000 Acute cystitis without hematuria: Secondary | ICD-10-CM | POA: Diagnosis not present

## 2015-08-27 DIAGNOSIS — Z0000 Encounter for general adult medical examination without abnormal findings: Secondary | ICD-10-CM | POA: Diagnosis not present

## 2015-08-27 NOTE — Telephone Encounter (Signed)
I approve of this POC.

## 2015-08-31 NOTE — Telephone Encounter (Signed)
Purpose of this communication is to relay that that the set frequency for home visits this week has been changed due to a missed visit. Communication has been made with the patient to ensure needs have been met and to offer a home visit. At this time a return call has not been received before the business week is out. Next week the [facility] Nurse plans to continue contact with the patient to offer any services or attempt to address any needs the patient expresses that is reasonable.  

## 2015-09-03 DIAGNOSIS — Z0000 Encounter for general adult medical examination without abnormal findings: Secondary | ICD-10-CM | POA: Diagnosis not present

## 2015-09-03 DIAGNOSIS — C675 Malignant neoplasm of bladder neck: Secondary | ICD-10-CM | POA: Diagnosis not present

## 2015-09-03 DIAGNOSIS — C670 Malignant neoplasm of trigone of bladder: Secondary | ICD-10-CM | POA: Diagnosis not present

## 2015-09-03 DIAGNOSIS — Z5111 Encounter for antineoplastic chemotherapy: Secondary | ICD-10-CM | POA: Diagnosis not present

## 2015-09-06 ENCOUNTER — Telehealth: Payer: Self-pay | Admitting: *Deleted

## 2015-09-06 NOTE — Telephone Encounter (Signed)
RN contacted [facility] to confirm that the patient's profile has been transferred to there location with plans of filling the next refill. Gerald Stabs confirmed that they have the patient's profile. Patient's current address was updated at well.

## 2015-09-07 ENCOUNTER — Ambulatory Visit: Payer: Self-pay | Admitting: *Deleted

## 2015-09-07 DIAGNOSIS — B20 Human immunodeficiency virus [HIV] disease: Secondary | ICD-10-CM

## 2015-09-10 DIAGNOSIS — Z8551 Personal history of malignant neoplasm of bladder: Secondary | ICD-10-CM | POA: Diagnosis not present

## 2015-09-10 DIAGNOSIS — Z5111 Encounter for antineoplastic chemotherapy: Secondary | ICD-10-CM | POA: Diagnosis not present

## 2015-09-10 DIAGNOSIS — Z0000 Encounter for general adult medical examination without abnormal findings: Secondary | ICD-10-CM | POA: Diagnosis not present

## 2015-09-10 DIAGNOSIS — C675 Malignant neoplasm of bladder neck: Secondary | ICD-10-CM | POA: Diagnosis not present

## 2015-09-10 DIAGNOSIS — C670 Malignant neoplasm of trigone of bladder: Secondary | ICD-10-CM | POA: Diagnosis not present

## 2015-09-10 DIAGNOSIS — N3000 Acute cystitis without hematuria: Secondary | ICD-10-CM | POA: Diagnosis not present

## 2015-09-13 NOTE — Progress Notes (Signed)
Patient ID: [name], male   DOB: 07-Mar-1956, 60 y.o.   MRN: [id number] RN arrived at the patient's home for our prearranged visit at 3pm. After knocking on the patient's door and waiting at his home for greater than 5 minutes RN left the home.

## 2015-09-17 DIAGNOSIS — C675 Malignant neoplasm of bladder neck: Secondary | ICD-10-CM | POA: Diagnosis not present

## 2015-09-17 DIAGNOSIS — Z0000 Encounter for general adult medical examination without abnormal findings: Secondary | ICD-10-CM | POA: Diagnosis not present

## 2015-09-17 DIAGNOSIS — Z5111 Encounter for antineoplastic chemotherapy: Secondary | ICD-10-CM | POA: Diagnosis not present

## 2015-09-17 DIAGNOSIS — C670 Malignant neoplasm of trigone of bladder: Secondary | ICD-10-CM | POA: Diagnosis not present

## 2015-09-21 ENCOUNTER — Ambulatory Visit: Payer: Self-pay | Admitting: *Deleted

## 2015-09-21 VITALS — BP 156/78 | HR 72 | Temp 97.5°F | Resp 18

## 2015-09-21 DIAGNOSIS — B20 Human immunodeficiency virus [HIV] disease: Secondary | ICD-10-CM

## 2015-09-21 NOTE — Progress Notes (Signed)
Patient ID: [name], male   DOB: [birth year], 60 y.o.   MRN: [id number]  Rn meet with the patient in his home. Patient states he has a trip planned to [place] on March 1st until the 13th of March. RN asked the patient if he has a plan for getting his rent paid before leaving on his trip. Patient stated he had not came up with a plan yet but stated he does not want to get padlocked out. RN advised the patient that he will need to come up with a plan before he leaves. RN informed the patient that it would be best to have a plan before he leaves. Patient verbalized understanding. RN asked the paitent about his medication. Patient stated he only has a couple left and would need his medications but does not have transportation for pick up. RN contacted [place] who agreed to [facility] the package and leave in at his home within a couple of days. The patient did not like to idea of having his packagle left on the step. RN informed the patient that this may be the best option and he will have to be looking out for his medications on Sat or Monday. RN informed the patient that if the process does not work for him then we can work on another process

## 2015-09-24 DIAGNOSIS — Z5111 Encounter for antineoplastic chemotherapy: Secondary | ICD-10-CM | POA: Diagnosis not present

## 2015-09-24 DIAGNOSIS — Z0000 Encounter for general adult medical examination without abnormal findings: Secondary | ICD-10-CM | POA: Diagnosis not present

## 2015-09-24 DIAGNOSIS — C670 Malignant neoplasm of trigone of bladder: Secondary | ICD-10-CM | POA: Diagnosis not present

## 2015-09-24 DIAGNOSIS — C675 Malignant neoplasm of bladder neck: Secondary | ICD-10-CM | POA: Diagnosis not present

## 2015-09-25 NOTE — Patient Instructions (Signed)
Please refer to progress note for details of today's visit and education provided 

## 2015-10-01 DIAGNOSIS — Z5111 Encounter for antineoplastic chemotherapy: Secondary | ICD-10-CM | POA: Diagnosis not present

## 2015-10-01 DIAGNOSIS — C675 Malignant neoplasm of bladder neck: Secondary | ICD-10-CM | POA: Diagnosis not present

## 2015-10-01 DIAGNOSIS — Z0000 Encounter for general adult medical examination without abnormal findings: Secondary | ICD-10-CM | POA: Diagnosis not present

## 2015-10-01 IMAGING — RF DG RETROGRADE PYELOGRAM
1 series · 7 of 7 positions shown · non-contrast
Comparison: None.

CLINICAL DATA: 58-year-old male with history of bladder cancer.
Previous TURBT

EXAM:
RETROGRADE PYELOGRAM

[Series 1: run · 7 of 7 slices shown]
[im 1/7]
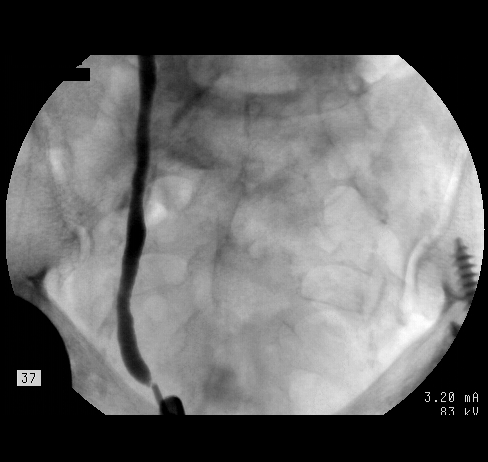
[im 2/7]
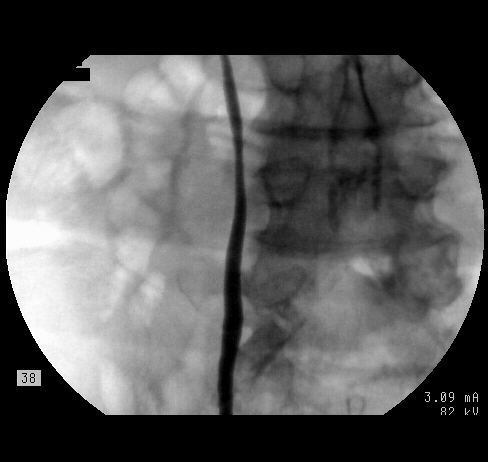
[im 3/7]
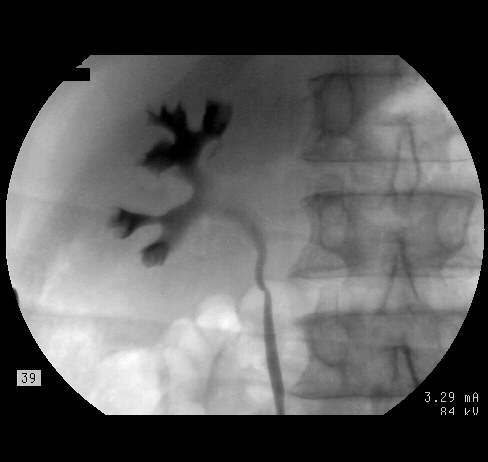
[im 4/7]
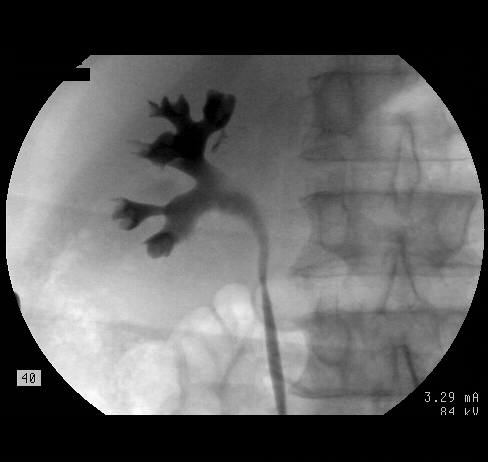
[im 5/7]
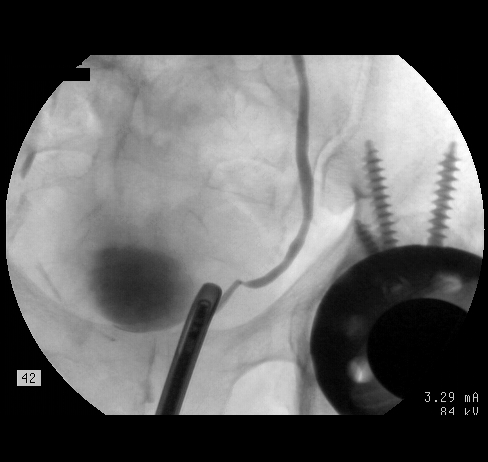
[im 6/7]
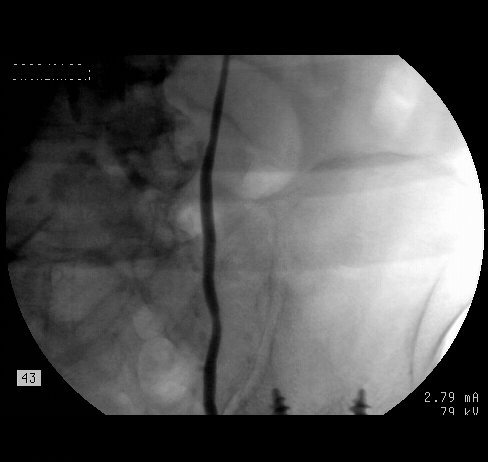
[im 7/7]
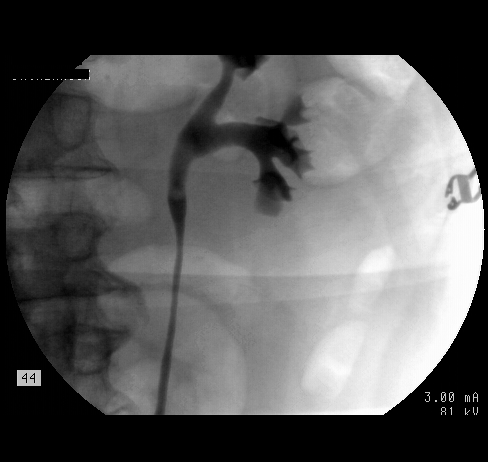

[7 of 7 positions shown; findings below may reference images not displayed]

FINDINGS: Intraoperative fluoroscopic spot images of bilateral retrograde
pyelogram.

Right:

Cannulation of the distal ureter with retrograde infusion of
contrast. Incomplete filling of the collecting system and proximal
right ureter. No obvious filling defect. No hydronephrosis or
pelvicaliectasis.

Left:

Cannulation the distal left ureter with retrograde filling of the
ureter and collecting system. Incomplete filling of the
ureter/collecting system at the ureteropelvic junction. Differential
attenuation of the contrast column at the left ureteropelvic
junction. No hydronephrosis or pelvicaliectasis.
IMPRESSION: Intraoperative fluoroscopic spot images of bilateral retrograde
pyelogram. Differential attenuation of the contrast column at the
left ureteropelvic junction, of uncertain significance, may simply
represent incomplete opacification with contrast.

No hydronephrosis or pelvicaliectasis.

Please refer to dictated operative report for full details.

## 2015-10-02 ENCOUNTER — Telehealth: Payer: Self-pay | Admitting: *Deleted

## 2015-10-02 ENCOUNTER — Other Ambulatory Visit: Payer: Medicare Other | Admitting: *Deleted

## 2015-10-02 NOTE — Telephone Encounter (Signed)
RN contacted the patient to f/u with him. Patient stated he gave me a call because he did not receive his shipment of medication. RN informed [name] that I will f/u with the Pharmacy. My current concern is that the patient has plans to leave for [place] and I do not want him to leave without his medications   RN contacted [facility]/Rob who stated they have been trying to call the patient to place a credit card on file to cover the cost of his Co-Pay which is 8.67. Rob said [name] can also come in and pay his Co-Pay today since his medication are already ready   RN contacted the patient and gave him the update that he will need to pay his Co-Pay 1st before they can send his medication. [name] stated he will need to cancel our visit today since he is getting ready for his trip to [place]. Patient stated he has a great deal of Laundry to do and will go and get his medications from [facility].

## 2015-10-17 ENCOUNTER — Other Ambulatory Visit: Payer: Medicare Other

## 2015-10-17 DIAGNOSIS — Z79899 Other long term (current) drug therapy: Secondary | ICD-10-CM | POA: Diagnosis not present

## 2015-10-17 DIAGNOSIS — B20 Human immunodeficiency virus [HIV] disease: Secondary | ICD-10-CM | POA: Diagnosis not present

## 2015-10-17 LAB — COMPREHENSIVE METABOLIC PANEL
ALBUMIN: 3.6 g/dL (ref 3.6–5.1)
ALK PHOS: 91 U/L (ref 40–115)
ALT: 17 U/L (ref 9–46)
AST: 40 U/L — ABNORMAL HIGH (ref 10–35)
BUN: 8 mg/dL (ref 7–25)
CALCIUM: 8.9 mg/dL (ref 8.6–10.3)
CHLORIDE: 103 mmol/L (ref 98–110)
CO2: 21 mmol/L (ref 20–31)
Creat: 1.05 mg/dL (ref 0.70–1.25)
Glucose, Bld: 102 mg/dL — ABNORMAL HIGH (ref 65–99)
POTASSIUM: 4.8 mmol/L (ref 3.5–5.3)
Sodium: 133 mmol/L — ABNORMAL LOW (ref 135–146)
TOTAL PROTEIN: 7.9 g/dL (ref 6.1–8.1)
Total Bilirubin: 0.4 mg/dL (ref 0.2–1.2)

## 2015-10-17 LAB — CBC
HEMATOCRIT: 37.5 % — AB (ref 39.0–52.0)
Hemoglobin: 13.1 g/dL (ref 13.0–17.0)
MCH: 34 pg (ref 26.0–34.0)
MCHC: 34.9 g/dL (ref 30.0–36.0)
MCV: 97.4 fL (ref 78.0–100.0)
MPV: 9.1 fL (ref 8.6–12.4)
Platelets: 241 10*3/uL (ref 150–400)
RBC: 3.85 MIL/uL — AB (ref 4.22–5.81)
RDW: 14.4 % (ref 11.5–15.5)
WBC: 4 10*3/uL (ref 4.0–10.5)

## 2015-10-17 LAB — LIPID PANEL
CHOL/HDL RATIO: 1.4 ratio (ref <=5.0)
CHOLESTEROL: 129 mg/dL (ref 125–200)
HDL: 92 mg/dL (ref >=40)
LDL Cholesterol: 28 mg/dL (ref <130)
TRIGLYCERIDES: 43 mg/dL (ref <150)
VLDL: 9 mg/dL (ref <30)

## 2015-10-18 LAB — HIV-1 RNA QUANT-NO REFLEX-BLD
HIV 1 RNA QUANT: 980 {copies}/mL — AB (ref <20)
HIV-1 RNA QUANT, LOG: 2.99 {Log_copies}/mL — AB (ref <1.30)

## 2015-10-18 LAB — T-HELPER CELL (CD4) - (RCID CLINIC ONLY)
CD4 T CELL ABS: 890 /uL (ref 400–2700)
CD4 T CELL HELPER: 36 % (ref 33–55)

## 2015-10-18 LAB — RPR

## 2015-10-31 ENCOUNTER — Encounter: Payer: Self-pay | Admitting: Internal Medicine

## 2015-10-31 ENCOUNTER — Ambulatory Visit (INDEPENDENT_AMBULATORY_CARE_PROVIDER_SITE_OTHER): Payer: Medicare Other | Admitting: Internal Medicine

## 2015-10-31 ENCOUNTER — Ambulatory Visit
Admission: RE | Admit: 2015-10-31 | Discharge: 2015-10-31 | Disposition: A | Discharge status: Home / Self Care | Payer: Medicare Other | Source: Ambulatory Visit | Attending: Internal Medicine | Admitting: Internal Medicine

## 2015-10-31 VITALS — BP 194/80 | HR 80 | Temp 97.9°F

## 2015-10-31 DIAGNOSIS — Z227 Latent tuberculosis: Secondary | ICD-10-CM

## 2015-10-31 DIAGNOSIS — B20 Human immunodeficiency virus [HIV] disease: Secondary | ICD-10-CM

## 2015-10-31 DIAGNOSIS — R7611 Nonspecific reaction to tuberculin skin test without active tuberculosis: Secondary | ICD-10-CM | POA: Diagnosis not present

## 2015-10-31 DIAGNOSIS — R0602 Shortness of breath: Secondary | ICD-10-CM

## 2015-10-31 DIAGNOSIS — R799 Abnormal finding of blood chemistry, unspecified: Secondary | ICD-10-CM

## 2015-10-31 DIAGNOSIS — R55 Syncope and collapse: Secondary | ICD-10-CM

## 2015-10-31 DIAGNOSIS — F32A Depression, unspecified: Secondary | ICD-10-CM

## 2015-10-31 DIAGNOSIS — F329 Major depressive disorder, single episode, unspecified: Secondary | ICD-10-CM | POA: Diagnosis not present

## 2015-10-31 MED ORDER — ALBUTEROL SULFATE HFA 108 (90 BASE) MCG/ACT IN AERS: 2.0000 | INHALATION_SPRAY | Freq: Four times a day (QID) | RESPIRATORY_TRACT | Status: DC | PRN

## 2015-10-31 MED ORDER — ELVITEG-COBIC-EMTRICIT-TENOFAF 150-150-200-10 MG PO TABS: 1.0000 | ORAL_TABLET | Freq: Every day | ORAL | Status: DC

## 2015-10-31 NOTE — Progress Notes (Signed)
Patient ID: [name], male   DOB: 12/27/1955, 60 y.o.   MRN: [id number]          Patient Active Problem List   Diagnosis Date Noted  . HIV disease ([place]) 04/18/2015    Priority: High  . Dysuria 05/29/2015    Priority: Medium  . Depression 04/19/2015    Priority: Medium  . 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 Malignant neoplasm of bladder ([place]) 03/30/2015    Priority: Medium  . Syncope 10/31/2015  . HTN (hypertension) 03/30/2015  . Asthma, chronic 03/30/2015    Patient's Medications  New Prescriptions   No medications on file  Previous Medications   ALBUTEROL (PROVENTIL) (2.5 MG/3ML) 0.083% NEBULIZER SOLUTION    Take 3 mLs (2.5 mg total) by nebulization every 6 (six) hours as needed for wheezing or shortness of breath.   AMLODIPINE (NORVASC) 5 MG TABLET    TAKE 1 TABLET (5 MG TOTAL) BY MOUTH DAILY.   OXYCODONE-ACETAMINOPHEN (PERCOCET/ROXICET) 5-325 MG TABLET    Take 1-2 tablets by mouth every 6 (six) hours as needed for severe pain.   PHENAZOPYRIDINE (PYRIDIUM) 200 MG TABLET    Take 1 tablet (200 mg total) by mouth 3 (three) times daily as needed for pain.  Modified Medications   Modified Medication Previous Medication   ALBUTEROL (VENTOLIN HFA) 108 (90 BASE) MCG/ACT INHALER VENTOLIN HFA 108 (90 Base) MCG/ACT inhaler      Inhale 2 puffs into the lungs every 6 (six) hours as needed for wheezing or shortness of breath.    INHALE 2 PUFFS INTO THE LUNGS EVERY 6 (SIX) HOURS AS NEEDED FOR WHEEZING OR SHORTNESS OF BREATH.   ELVITEGRAVIR-COBICISTAT-EMTRICITABINE-TENOFOVIR (GENVOYA) 150-150-200-10 MG TABS TABLET elvitegravir-cobicistat-emtricitabine-tenofovir (GENVOYA) 150-150-200-10 MG TABS tablet      Take 1 tablet by mouth daily with breakfast.    Take 1 tablet by mouth daily with breakfast.  Discontinued Medications   No medications on file    Subjective: [name] is in for his routine HIV follow-up visit. He has missed some doses of his Genvoya. He  went out of town to [place] to visit his family and forgot to take his medication. He was gone from February 28 to May 12. He came in here for blood work on May 15. He has not missed any doses since returning but needs refills on his medication. He takes it each evening with a meal.  He states that when he was in [place] he had a syncopal episode when riding home with his daughter in her car. He does not recall anything about the episode. When he woke up he was told that he blacked out. He bruised his right elbow and his left upper lip. He was also very sore in his right ribs for several days afterward. She felt dizzy for about 24 hours. He did not have any headache, chest pain, shortness of breath or other problems. He recalls having had 2 12 ounce Budweiser's before he passed out but states that he never drinks to get drunk and he does not believe this is what caused him to black out. He denies any drug use.  He tells me that he is set to undergo repeat cystoscopy in the near future to see if he needs more chemotherapy. He has had previous BCG installations for his bladder cancer. He recalls having had a negative TB skin test several years ago in [place]. He does not know of any contacts with active tuberculosis. His  QuantiFERON gold TB assay was positive here last fall.   He states that he still has periods where he feels down and somewhat depressed but he knows that the only thing for him to do is to go on with his life.  Review of Systems: Review of Systems  Constitutional: Negative for fever, chills, weight loss, malaise/fatigue and diaphoresis.  HENT: Negative for sore throat.   Respiratory: Negative for cough, sputum production and shortness of breath.   Cardiovascular: Negative for chest pain.  Gastrointestinal: Negative for nausea, vomiting, abdominal pain and diarrhea.  Genitourinary: Negative for dysuria and frequency.  Musculoskeletal: Negative for myalgias and joint pain.  Skin:  Negative for rash.  Neurological: Positive for dizziness. Negative for sensory change, speech change, focal weakness and headaches.       One episode of syncope recently has noted in history of present illness.  Psychiatric/Behavioral: Positive for depression. Negative for substance abuse. The patient is not nervous/anxious.     Past Medical History  Diagnosis Date  . Hypertension   . Nocturia   . Bladder cancer (H[facility])     and prostatic urethra cancer  HX TURBT'S IN [place], [place]  WITH INSTILLATION CHEMO TX'S  . Asthma with COPD ([place])   . History of transfusion   . HIV disease ([place])     under care of [facility]  . Bladder tumor     Social History  Substance Use Topics  . Smoking status: Current Every Day Smoker -- 0.50 packs/day for 17 years    Types: Cigarettes  . Smokeless tobacco: Never Used  . Alcohol Use: 2.4 oz/week    4 Cans of beer per week     Comment: occasional    Family History  Problem Relation Age of Onset  . Cancer Mother     Gastric Cancer  . Hypertension Mother   . Heart disease Mother   . Cancer Maternal Grandmother     breast cancer     Allergies  Allergen Reactions  . Aspirin Shortness Of Breath, Nausea And Vomiting and Swelling    Objective:  Filed Vitals:   10/31/15 1354  BP: 194/80  Pulse: 80  Temp: 97.9 F (36.6 C)  TempSrc: Oral   There is no weight on file to calculate BMI.  Physical Exam  Constitutional: He is oriented to person, place, and time.  He is talkative and in good spirits.  HENT:  Mouth/Throat: No oropharyngeal exudate.  Eyes: Conjunctivae are normal.  Cardiovascular: Normal rate and regular rhythm.   No murmur heard. Pulmonary/Chest: Effort normal. He has wheezes. He exhibits no tenderness.  Distant breath sounds throughout all lung fields with occasional faint wheeze.  Abdominal: Soft. He exhibits no mass. There is no tenderness.  Musculoskeletal: Normal range of motion.    Neurological: He is alert and oriented to person, place, and time.  Skin: No rash noted.  Contusion on right elbow with some swelling.  Psychiatric: Mood and affect normal.    Lab Results Lab Results  Component Value Date   WBC 4.0 10/17/2015   HGB 13.1 10/17/2015   HCT 37.5* 10/17/2015   MCV 97.4 10/17/2015   PLT 241 10/17/2015    Lab Results  Component Value Date   CREATININE 1.05 10/17/2015   BUN 8 10/17/2015   NA 133* 10/17/2015   K 4.8 10/17/2015   CL 103 10/17/2015   CO2 21 10/17/2015    Lab Results  Component Value Date   ALT 17  10/17/2015   AST 40* 10/17/2015   ALKPHOS 91 10/17/2015   BILITOT 0.4 10/17/2015    Lab Results  Component Value Date   CHOL 129 10/17/2015   HDL 92 10/17/2015   LDLCALC 28 10/17/2015   TRIG 43 10/17/2015   CHOLHDL 1.4 10/17/2015    Lab Results HIV 1 RNA QUANT (copies/mL)  Date Value  10/17/2015 980*  07/16/2015 <20  04/19/2015 172*   CD4 T CELL ABS (/uL)  Date Value  10/17/2015 890  07/16/2015 740  03/20/2015 620      Problem List Items Addressed This Visit      High   HIV disease ([place])    His viral load reactivated after being off of Genvoya. I reinforced the need for him to take it every day. He will follow-up after repeat blood work in 6 weeks.      Relevant Medications   elvitegravir-cobicistat-emtricitabine-tenofovir (GENVOYA) 150-150-200-10 MG TABS tablet   Other Relevant Orders   T-helper cell (CD4)- ([name] clinic only)   HIV 1 RNA quant-no reflex-bld   CBC   Comprehensive metabolic panel     Medium   Depression    He remains mildly depressed. I asked him to meet with Curley Spice, behavioral health counselor today and to schedule a regular follow-up visit with Rolena Infante. He is also going to continue seeing her to be out reach nurse, [name].      Latent tuberculosis by blood test    I must assume that is positive QuantiFERON gold represents prior exposure to TB. Is relatively unlikely that his  BCG bladder instillations would give him a false positive. I will need to check with his urologist, Dr. Louis Meckel, to see if he is going to be receiving future BCG therapy. He will need at least 6 months of INH but I would be concerned that if he is receiving BCG therapy for bladder cancer the INH could render that less effective.      Relevant Orders   DG Chest 2 View     Unprioritized   Syncope - Primary    I'm not sure what caused his syncopal episode. We will help him find a primary care provider locally.       Other Visit Diagnoses    SOBOE (shortness of breath on exertion)        Relevant Medications    albuterol (VENTOLIN HFA) 108 (90 Base) MCG/ACT inhaler         Michel Bickers, MD [facility] [place] [phone] pager   [phone] cell 10/31/2015, 2:27 PM

## 2015-10-31 NOTE — Assessment & Plan Note (Signed)
He remains mildly depressed. I asked him to meet with Curley Spice, behavioral health counselor today and to schedule a regular follow-up visit with Rolena Infante. He is also going to continue seeing her to be out reach nurse, [name].

## 2015-10-31 NOTE — Assessment & Plan Note (Signed)
I'm not sure what caused his syncopal episode. We will help him find a primary care provider locally.

## 2015-10-31 NOTE — Assessment & Plan Note (Signed)
I must assume that is positive QuantiFERON gold represents prior exposure to TB. Is relatively unlikely that his BCG bladder instillations would give him a false positive. I will need to check with his urologist, Dr. Louis Meckel, to see if he is going to be receiving future BCG therapy. He will need at least 6 months of INH but I would be concerned that if he is receiving BCG therapy for bladder cancer the INH could render that less effective.

## 2015-10-31 NOTE — Assessment & Plan Note (Signed)
His viral load reactivated after being off of Genvoya. I reinforced the need for him to take it every day. He will follow-up after repeat blood work in 6 weeks.

## 2015-11-05 ENCOUNTER — Telehealth: Payer: Self-pay | Admitting: *Deleted

## 2015-11-05 NOTE — Telephone Encounter (Signed)
RN contacted the patient and we discussed his inability to get his medications. RN reminded the patient of our conversation from 10/02/15 when we discussed the 8.67 copay. At that time Ms. Lemas stated he was going to go and pay the co-pay before leaving to go to [place]. Mr Snowdon stated he remembered that converstation but did not make it to [facility] before leaving. RN informed Mr Nutting that his virus is "showing" again and he really needs to get back on his medication. Mr Devol agreed stating he has been trying to take some old medications but he guessed it did not work. RN verbalized a understanding and instructed the patient that he really needs to get his medication from [facility]. Mr Terracciano stated he will be sure to get them. Mr. Raba and I arranged for a home visit on Thursday. If [facility] has not picked up his medications by Thursday we will discuss the reason's why and if the cost of the medications is a issue I will see if [facility] can cover the co-pay cost this month and then we come up with a permanent plan for paying his Co-pay

## 2015-11-06 NOTE — Telephone Encounter (Signed)
Patient called Triage asking which his pharmacy was sent to.  Rx sent to [facility].  Patient concerned about his copay, he will call [name]. Landis Gandy, RN

## 2015-11-08 ENCOUNTER — Ambulatory Visit: Payer: Self-pay | Admitting: *Deleted

## 2015-11-08 ENCOUNTER — Telehealth: Payer: Self-pay | Admitting: *Deleted

## 2015-11-08 VITALS — BP 152/92 | HR 78 | Temp 97.6°F

## 2015-11-08 DIAGNOSIS — R55 Syncope and collapse: Secondary | ICD-10-CM

## 2015-11-08 DIAGNOSIS — B20 Human immunodeficiency virus [HIV] disease: Secondary | ICD-10-CM

## 2015-11-15 ENCOUNTER — Telehealth: Payer: Self-pay | Admitting: *Deleted

## 2015-11-15 ENCOUNTER — Other Ambulatory Visit: Payer: Medicare Other | Admitting: *Deleted

## 2015-11-27 NOTE — Progress Notes (Addendum)
RN meet with the patient in his home. Patient acknowledged that he did not take his medications during his most recent trip. Patient's family is not aware of this HIV status and he would like to maintain his privacy. Patient stated during his recent visit to [place] he fell and scarred his arm and busted his lip. Patient stated he had been drinking but it was not excessive and he did not feel "drunk". Mr. Alder stated his family is trying to get him to move to [place] but he does not want to move away from his established Dr's here in [place]. RN verbalized a understanding for the patient not wanting to move. RN verified and visually saw that the patient now has his medications in the home(Genvoya and BP medications). [facility] currently does not have a stove in his home due to electrical concerns with the stove. The facility is planning on replacing the stove today. He does have a microwave in the home     Patient was re-evaluated on 11/08/2015 for [facility].  Patient was consented to care at this time.    Frequency / Duration of [facility] visits: Effective 11/08/2015:  85mo1, 47mo2, 29mo1, 3PRN's for complications with disease process/progression, medication changes or concerns  [facility] will assess for learning needs related to diagnosis and treatment regimen, provide education as needed, fill pill box if needed, address any barriers which may be preventing medication compliance, and communicating with care team including physician and case manager.  Individualized Plan Of Care, Certification Period of 11/12/2015 to 02/10/2016 a. Type of service(s) and care to be delivered: [facility] Nurse b. Frequency and duration of service: Effective: 08/13/2015: 78mo1, 16mo2, 72mo1, 3 prns for complications with disease process/progression, medication changes or concerns c. Activity restrictions: Pt may be up as tolerated and can safely ambulate without the need for a assistive device. Pt wears glasses d. Safety  Measures: Standard Precautions/Infection Control e. Service Objectives and Goals:Service Objectives are to assist the pt with HIV medication regimen adherence and staying in care with the Infectious Disease Clinic by identifying barriers to care. RN will address the barriers that are identified by the patient.   Patient continues to have unmet goals with adherence questionable. Pt identified a goal of getting over his Bladder Cancer and would like to be in a relationship with a male who is also positive. pt does not feel comfortable disclosing his status. Current RN goal for this patient is to offer a lot of emotional support and encouragement. RN would like to accompany the patient to his Clinic appts, with his permission, to ensure he has a understanding of information provided.  f. Equipment required: No additional equipment needs at this time g. Functional Limitations: Vision. Pt has corrective glasses that he wears h. Rehabilitation potential: Guarded i. Diet and Nutritional Needs: Regular Diet j. Medications and treatments: Medications have been reconciled and reviewed and are a part of EPIC electronic file k. Specific therapies if needed: RN l. Pertinent diagnoses: Hypertension, Asthma, Bladder Cancer, Depression m. Expected outcome: Guarded n.

## 2015-11-29 NOTE — Telephone Encounter (Signed)
RN contacted patient prior to traveling to his home for our prearranged visit. RN spoke with [name] who stated he has some things to do and would prefer for me to come next week

## 2015-12-17 ENCOUNTER — Telehealth: Payer: Self-pay | Admitting: *Deleted

## 2015-12-17 DIAGNOSIS — Z0000 Encounter for general adult medical examination without abnormal findings: Secondary | ICD-10-CM | POA: Diagnosis not present

## 2015-12-17 DIAGNOSIS — C675 Malignant neoplasm of bladder neck: Secondary | ICD-10-CM | POA: Diagnosis not present

## 2015-12-17 NOTE — Telephone Encounter (Signed)
I approve of this plan of care. 

## 2015-12-17 NOTE — Telephone Encounter (Signed)
RN meet with the patient in his home. Patient acknowledged that he did not take his medications during his most recent trip. Patient's family is not aware of this HIV status and he would like to maintain his privacy. Patient stated during his recent visit to [place] he fell and scarred his arm and busted his lip. Patient stated he had been drinking but it was not excessive and he did not feel "drunk". Mr. Alder stated his family is trying to get him to move to [place] but he does not want to move away from his established Dr's here in [place]. RN verbalized a understanding for the patient not wanting to move. RN verified and visually saw that the patient now has his medications in the home(Genvoya and BP medications). [facility] currently does not have a stove in his home due to electrical concerns with the stove. The facility is planning on replacing the stove today. He does have a microwave in the home     Patient was re-evaluated on 11/08/2015 for [facility].  Patient was consented to care at this time.    Frequency / Duration of [facility] visits: Effective 11/08/2015:  85mo1, 47mo2, 29mo1, 3PRN's for complications with disease process/progression, medication changes or concerns  [facility] will assess for learning needs related to diagnosis and treatment regimen, provide education as needed, fill pill box if needed, address any barriers which may be preventing medication compliance, and communicating with care team including physician and case manager.  Individualized Plan Of Care, Certification Period of 11/12/2015 to 02/10/2016 a. Type of service(s) and care to be delivered: [facility] Nurse b. Frequency and duration of service: Effective: 08/13/2015: 78mo1, 16mo2, 72mo1, 3 prns for complications with disease process/progression, medication changes or concerns c. Activity restrictions: Pt may be up as tolerated and can safely ambulate without the need for a assistive device. Pt wears glasses d. Safety  Measures: Standard Precautions/Infection Control e. Service Objectives and Goals:Service Objectives are to assist the pt with HIV medication regimen adherence and staying in care with the Infectious Disease Clinic by identifying barriers to care. RN will address the barriers that are identified by the patient.   Patient continues to have unmet goals with adherence questionable. Pt identified a goal of getting over his Bladder Cancer and would like to be in a relationship with a male who is also positive. pt does not feel comfortable disclosing his status. Current RN goal for this patient is to offer a lot of emotional support and encouragement. RN would like to accompany the patient to his Clinic appts, with his permission, to ensure he has a understanding of information provided.  f. Equipment required: No additional equipment needs at this time g. Functional Limitations: Vision. Pt has corrective glasses that he wears h. Rehabilitation potential: Guarded i. Diet and Nutritional Needs: Regular Diet j. Medications and treatments: Medications have been reconciled and reviewed and are a part of EPIC electronic file k. Specific therapies if needed: RN l. Pertinent diagnoses: Hypertension, Asthma, Bladder Cancer, Depression m. Expected outcome: Guarded n.

## 2015-12-17 NOTE — Telephone Encounter (Signed)
RN contacted Mr Kostura and arranged a home visit with him for this Wednesday

## 2015-12-18 ENCOUNTER — Other Ambulatory Visit: Payer: Medicare Other

## 2015-12-19 ENCOUNTER — Ambulatory Visit: Payer: Self-pay | Admitting: *Deleted

## 2015-12-19 VITALS — BP 146/82 | HR 84 | Temp 95.7°F | Resp 18

## 2015-12-19 DIAGNOSIS — B20 Human immunodeficiency virus [HIV] disease: Secondary | ICD-10-CM

## 2015-12-20 DIAGNOSIS — C675 Malignant neoplasm of bladder neck: Secondary | ICD-10-CM | POA: Diagnosis not present

## 2015-12-24 ENCOUNTER — Other Ambulatory Visit: Payer: Medicare Other

## 2015-12-24 DIAGNOSIS — B20 Human immunodeficiency virus [HIV] disease: Secondary | ICD-10-CM

## 2015-12-24 LAB — COMPREHENSIVE METABOLIC PANEL
ALT: 19 U/L (ref 9–46)
AST: 41 U/L — ABNORMAL HIGH (ref 10–35)
Albumin: 3.6 g/dL (ref 3.6–5.1)
Alkaline Phosphatase: 104 U/L (ref 40–115)
BUN: 9 mg/dL (ref 7–25)
CO2: 23 mmol/L (ref 20–31)
Calcium: 8.7 mg/dL (ref 8.6–10.3)
Chloride: 106 mmol/L (ref 98–110)
Creat: 1.02 mg/dL (ref 0.70–1.25)
Glucose, Bld: 79 mg/dL (ref 65–99)
Potassium: 4.6 mmol/L (ref 3.5–5.3)
Sodium: 136 mmol/L (ref 135–146)
Total Bilirubin: 0.2 mg/dL (ref 0.2–1.2)
Total Protein: 7.8 g/dL (ref 6.1–8.1)

## 2015-12-24 LAB — CBC
HCT: 37.3 % — ABNORMAL LOW (ref 38.5–50.0)
Hemoglobin: 12.5 g/dL — ABNORMAL LOW (ref 13.2–17.1)
MCH: 33.2 pg — ABNORMAL HIGH (ref 27.0–33.0)
MCHC: 33.5 g/dL (ref 32.0–36.0)
MCV: 98.9 fL (ref 80.0–100.0)
MPV: 9.3 fL (ref 7.5–12.5)
Platelets: 191 10*3/uL (ref 140–400)
RBC: 3.77 MIL/uL — ABNORMAL LOW (ref 4.20–5.80)
RDW: 14 % (ref 11.0–15.0)
WBC: 4 10*3/uL (ref 3.8–10.8)

## 2015-12-25 LAB — T-HELPER CELL (CD4) - (RCID CLINIC ONLY)
CD4 T CELL ABS: 910 /uL (ref 400–2700)
CD4 T CELL HELPER: 42 % (ref 33–55)

## 2015-12-25 LAB — HIV-1 RNA QUANT-NO REFLEX-BLD
HIV 1 RNA Quant: 246 copies/mL — ABNORMAL HIGH (ref <20)
HIV-1 RNA Quant, Log: 2.39 Log copies/mL — ABNORMAL HIGH (ref <1.30)

## 2016-01-01 ENCOUNTER — Ambulatory Visit: Payer: Medicare Other | Admitting: *Deleted

## 2016-01-01 ENCOUNTER — Encounter: Payer: Self-pay | Admitting: Internal Medicine

## 2016-01-01 ENCOUNTER — Ambulatory Visit (INDEPENDENT_AMBULATORY_CARE_PROVIDER_SITE_OTHER): Payer: Medicare Other | Admitting: Internal Medicine

## 2016-01-01 VITALS — Temp 98.4°F | Ht 64.0 in | Wt 135.0 lb

## 2016-01-01 DIAGNOSIS — Z227 Latent tuberculosis: Secondary | ICD-10-CM

## 2016-01-01 DIAGNOSIS — F329 Major depressive disorder, single episode, unspecified: Secondary | ICD-10-CM | POA: Diagnosis not present

## 2016-01-01 DIAGNOSIS — F32A Depression, unspecified: Secondary | ICD-10-CM

## 2016-01-01 DIAGNOSIS — B20 Human immunodeficiency virus [HIV] disease: Secondary | ICD-10-CM

## 2016-01-01 DIAGNOSIS — R799 Abnormal finding of blood chemistry, unspecified: Secondary | ICD-10-CM | POA: Diagnosis not present

## 2016-01-01 DIAGNOSIS — F321 Major depressive disorder, single episode, moderate: Secondary | ICD-10-CM

## 2016-01-01 MED ORDER — ELVITEG-COBIC-EMTRICIT-TENOFAF 150-150-200-10 MG PO TABS: 1.0000 | ORAL_TABLET | Freq: Every day | ORAL | Status: DC

## 2016-01-01 MED ORDER — VITAMIN B-6 50 MG PO TABS: 50.0000 mg | ORAL_TABLET | Freq: Every day | ORAL | Status: DC

## 2016-01-01 MED ORDER — ISONIAZID 300 MG PO TABS: 300.0000 mg | ORAL_TABLET | Freq: Every day | ORAL | Status: DC

## 2016-01-01 MED FILL — ISONIAZID 300 MG TABLET: 300 | 30 days supply | Qty: 30 | Fill #0

## 2016-01-01 MED FILL — GENVOYA TABLET: 150-150-200 | 30 days supply | Qty: 30 | Fill #0

## 2016-01-01 NOTE — Assessment & Plan Note (Signed)
His adherence is still somewhat erratic. I had him meet with her pharmacy team today and they help change him to a mail order pharmacy so he will hopefully get his medications on a timely basis and not have to miss. Our pharmacist, Onnie Boer, we'll also contact him by phone soon to make sure that he is taking his medications correctly and consistently. His viral load is coming down. He will followup in 6 weeks.

## 2016-01-01 NOTE — Assessment & Plan Note (Signed)
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 He has no clinical or radiographic evidence of active TB at this time. I will start him on isoniazid and vitamin B6 and plan on 9 months of therapy, especially since he may be starting chemotherapy soon.

## 2016-01-01 NOTE — BH Specialist Note (Signed)
Counselor met with [name] today in the exam room for a warm handoff and to check in with patient and see how he was doing.  Patient was oriented times four with good affect and dress.  Patient appeared well and communicated that he felt fine.  Patient did say that he starts back with his chemotherapy tomorrow and was not looking forward to it.  Patient shared that the procedure of inserting the tube for the chemo is very uncomfortable and painful. Counselor provided support and encouragement for patient.  Counselor reminded patient that even though as painful and uncomfortable he may get for those few minutes, the long term affect would outweigh it and help keep him alive.  Patient agreed and said he would just grin and bare it as best as he could.   Counselor recommended that patient make an appointment in about a month to check in. Patient stated that he would.   Rolena Infante, MA, L[name]

## 2016-01-01 NOTE — Assessment & Plan Note (Signed)
His depression is improving. I did have him meet with our behavioral health counselor, Rolena Infante, again today.

## 2016-01-01 NOTE — Progress Notes (Signed)
Patient ID: [name], male   DOB: 02-02-1956, 60 y.o.   MRN: [id number] HPI: [name] is a 60 y.o. male who is here for his HIV visit.   Allergies: Allergies  Allergen Reactions  . Aspirin Shortness Of Breath, Nausea And Vomiting and Swelling    Vitals: Temp: 98.4 F (36.9 C) (05/30 1133) Temp Source: Oral (05/30 1133)  Past Medical History: Past Medical History  Diagnosis Date  . Hypertension   . Nocturia   . Bladder cancer (HCC)     and prostatic urethra cancer  HX TURBT'S IN [place], [place]  WITH INSTILLATION CHEMO TX'S  . Asthma with COPD ([place])   . History of transfusion   . HIV disease ([place])     under care of [facility]  . Bladder tumor     Social History: Social History   Social History  . Marital Status: Legally Separated    Spouse Name: N/A  . Number of Children: N/A  . Years of Education: N/A   Social History Main Topics  . Smoking status: Current Every Day Smoker -- 0.50 packs/day for 17 years    Types: Cigarettes  . Smokeless tobacco: Never Used     Comment: trying to quit  . Alcohol Use: 3.0 oz/week    5 Cans of beer per week     Comment: occasional  . Drug Use: No  . Sexual Activity:    Partners: Female    Patent examiner Protection: Condom     Comment: given condoms   Other Topics Concern  . Not on file   Social History Narrative    Previous Regimen: Complera  Current Regimen: Genvoya  Labs: HIV 1 RNA QUANT (copies/mL)  Date Value  12/24/2015 246*  10/17/2015 980*  07/16/2015 <20   CD4 T CELL ABS (/uL)  Date Value  12/24/2015 910  10/17/2015 890  07/16/2015 740   HEP B S AB (no units)  Date Value  03/20/2015 NEG   HEPATITIS B SURFACE AG (no units)  Date Value  03/20/2015 NEGATIVE   HCV AB (no units)  Date Value  03/20/2015 NEGATIVE    CrCl: Estimated Creatinine Clearance: 62.9 mL/min (by C-G formula based on Cr of 1.02).  Lipids:    Component Value Date/Time   CHOL 129 10/17/2015  1058   TRIG 43 10/17/2015 1058   HDL 92 10/17/2015 1058   CHOLHDL 1.4 10/17/2015 1058   VLDL 9 10/17/2015 1058   LDLCALC 28 10/17/2015 1058    Assessment: [name] is in for his HIV visit today. He stated that he has had some issue with getting med from month to month due to refill issue. He would have to call and lead to delay for a couple of days. He seems a little frustrated with the process. We are going to try to see if we can send it over to [facility] to be filled so it can be set up on autorefill and mail it to him. It was confirmed that [facility] can fill it and can mail to him. 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   Recommendations:  Tx Genvoya to [facility] INH 300mg  PO qday Vitamin B6 50mg  PO qday  [name], PharmD Clinical Infectious [facility] 01/01/2016, 1:39 PM

## 2016-01-01 NOTE — Progress Notes (Signed)
Patient Active Problem List   Diagnosis Date Noted  . HIV disease ([place]) 04/18/2015    Priority: High  . Dysuria 05/29/2015    Priority: Medium  . Depression 04/19/2015    Priority: Medium  . 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 Malignant neoplasm of bladder ([place]) 03/30/2015    Priority: Medium  . Syncope 10/31/2015  . HTN (hypertension) 03/30/2015  . Asthma, chronic 03/30/2015    Patient's Medications  New Prescriptions   ISONIAZID (NYDRAZID) 300 MG TABLET    Take 1 tablet (300 mg total) by mouth daily.   PYRIDOXINE (VITAMIN B-6) 50 MG TABLET    Take 1 tablet (50 mg total) by mouth daily.  Previous Medications   ALBUTEROL (PROVENTIL) (2.5 MG/3ML) 0.083% NEBULIZER SOLUTION    Take 3 mLs (2.5 mg total) by nebulization every 6 (six) hours as needed for wheezing or shortness of breath.   ALBUTEROL (VENTOLIN HFA) 108 (90 BASE) MCG/ACT INHALER    Inhale 2 puffs into the lungs every 6 (six) hours as needed for wheezing or shortness of breath.   AMLODIPINE (NORVASC) 5 MG TABLET    TAKE 1 TABLET (5 MG TOTAL) BY MOUTH DAILY.   OXYCODONE-ACETAMINOPHEN (PERCOCET/ROXICET) 5-325 MG TABLET    Take 1-2 tablets by mouth every 6 (six) hours as needed for severe pain.   PHENAZOPYRIDINE (PYRIDIUM) 200 MG TABLET    Take 1 tablet (200 mg total) by mouth 3 (three) times daily as needed for pain.  Modified Medications   Modified Medication Previous Medication   ELVITEGRAVIR-COBICISTAT-EMTRICITABINE-TENOFOVIR (GENVOYA) 150-150-200-10 MG TABS TABLET elvitegravir-cobicistat-emtricitabine-tenofovir (GENVOYA) 150-150-200-10 MG TABS tablet      Take 1 tablet by mouth daily with breakfast.    Take 1 tablet by mouth daily with breakfast.  Discontinued Medications   No medications on file    Subjective: Eiden is in for his routine HIV followup visit. He states that he has been taking his Genvoya without difficulty since his last visit. He states that he did miss 2-3  days recently when his pharmacy he was late filling his refill but it sounds like he may have been late calling his pharmacy. It is not clear that he understands that he needs to call each month before he runs out. He states that he is feeling less depressed and trying not to worry about things. He does recall that a few years ago he was in a boarding house where someone was sick and coughing all the time. That person would not wear a mask as instructed and was being visited by a nurse on regular basis. Given that he recently had a positive QuantiFERON gold test for latent tuberculosis he wonders if that person may have been the source of his latent infection. He says that he will be seeing his urologist, Dr. Louis Meckel, tomorrow and has been told that he will need to be started on chemotherapy soon for his bladder cancer.  Review of Systems: Review of Systems  Constitutional: Negative for fever, chills, weight loss, malaise/fatigue and diaphoresis.  HENT: Negative for sore throat.   Respiratory: Negative for cough, sputum production and shortness of breath.   Cardiovascular: Negative for chest pain.  Gastrointestinal: Negative for nausea, vomiting, abdominal pain and diarrhea.  Genitourinary: Positive for dysuria. Negative for frequency.  Musculoskeletal: Negative for myalgias and joint pain.  Skin: Negative for rash.  Neurological: Negative for dizziness and headaches.  Psychiatric/Behavioral: Positive for  depression. Negative for substance abuse. The patient is not nervous/anxious.     Past Medical History  Diagnosis Date  . Hypertension   . Nocturia   . 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 Asthma with COPD ([place])   . History of transfusion   . HIV disease ([place])     under care of [facility]  . Bladder tumor     Social History  Substance Use Topics  . Smoking status: Current  Every Day Smoker -- 0.50 packs/day for 17 years    Types: Cigarettes  . Smokeless tobacco: Never Used     Comment: trying to quit  . Alcohol Use: 3.0 oz/week    5 Cans of beer per week     Comment: occasional    Family History  Problem Relation Age of Onset  . Cancer Mother     Gastric Cancer  . Hypertension Mother   . Heart disease Mother   . Cancer Maternal Grandmother     breast cancer     Allergies  Allergen Reactions  . Aspirin Shortness Of Breath, Nausea And Vomiting and Swelling    Objective:  Filed Vitals:   01/01/16 1133  Temp: 98.4 F (36.9 C)  TempSrc: Oral  Height: 5\' 4"  (1.626 m)  Weight: 135 lb (61.236 kg)   Body mass index is 23.16 kg/(m^2).  Physical Exam  Constitutional: He is oriented to person, place, and time.  He is alert and in good spirits.  HENT:  Mouth/Throat: No oropharyngeal exudate.  Eyes: Conjunctivae are normal.  Cardiovascular: Normal rate and regular rhythm.   No murmur heard. Pulmonary/Chest: Breath sounds normal. He has no wheezes. He has no rales.  Abdominal: Soft. There is no tenderness.  Musculoskeletal: Normal range of motion.  Neurological: He is alert and oriented to person, place, and time.  Skin: No rash noted.  Psychiatric: Mood and affect normal.    Lab Results Lab Results  Component Value Date   WBC 4.0 12/24/2015   HGB 12.5* 12/24/2015   HCT 37.3* 12/24/2015   MCV 98.9 12/24/2015   PLT 191 12/24/2015    Lab Results  Component Value Date   CREATININE 1.02 12/24/2015   BUN 9 12/24/2015   NA 136 12/24/2015   K 4.6 12/24/2015   CL 106 12/24/2015   CO2 23 12/24/2015    Lab Results  Component Value Date   ALT 19 12/24/2015   AST 41* 12/24/2015   ALKPHOS 104 12/24/2015   BILITOT 0.2 12/24/2015    Lab Results  Component Value Date   CHOL 129 10/17/2015   HDL 92 10/17/2015   LDLCALC 28 10/17/2015   TRIG 43 10/17/2015   CHOLHDL 1.4 10/17/2015    Lab Results HIV 1 RNA QUANT (copies/mL)  Date  Value  12/24/2015 246*  10/17/2015 980*  07/16/2015 <20   CD4 T CELL ABS (/uL)  Date Value  12/24/2015 910  10/17/2015 890  07/16/2015 740      Problem List Items Addressed This Visit      High   HIV disease ([place]) - Primary    His adherence is still somewhat erratic. I had him meet with her pharmacy team today and they help change him to a mail order pharmacy so he will hopefully get his medications on a timely basis and not have to miss. Our pharmacist, Onnie Boer, we'll also  contact him by phone soon to make sure that he is taking his medications correctly and consistently. His viral load is coming down. He will followup in 6 weeks.      Relevant Medications   elvitegravir-cobicistat-emtricitabine-tenofovir (GENVOYA) 150-150-200-10 MG TABS tablet   isoniazid (NYDRAZID) 300 MG tablet     Medium   Depression    His depression is improving. I did have him meet with our behavioral health counselor, Rolena Infante, again today.      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 He has no clinical or radiographic evidence of active TB at this time. I will start him on isoniazid and vitamin B6 and plan on 9 months of therapy, especially since he may be starting chemotherapy soon.      Relevant Medications   isoniazid (NYDRAZID) 300 MG tablet   pyridOXINE (VITAMIN B-6) 50 MG tablet        Michel Bickers, MD [facility] for Infectious [place] [phone] pager   [phone] cell 01/01/2016, 4:19 PM

## 2016-01-02 MED FILL — VITAMIN B-6 50 MG TABLET: 50 | 30 days supply | Qty: 30 | Fill #0

## 2016-01-09 ENCOUNTER — Other Ambulatory Visit: Payer: Self-pay | Admitting: Internal Medicine

## 2016-01-09 ENCOUNTER — Telehealth: Payer: Self-pay | Admitting: Internal Medicine

## 2016-01-09 DIAGNOSIS — Z5111 Encounter for antineoplastic chemotherapy: Secondary | ICD-10-CM | POA: Diagnosis not present

## 2016-01-09 DIAGNOSIS — C675 Malignant neoplasm of bladder neck: Secondary | ICD-10-CM | POA: Diagnosis not present

## 2016-01-09 NOTE — Telephone Encounter (Signed)
I spoke to Dr. Louis Meckel, Danna's urologist, on 01/04/2016. [name] another round of BCG bladder instillations for his aggressive bladder cancer Today. If his cancer progresses it is likely that he will end up with a cystectomy and ileal diversion. I told Dr. Louis Meckel while it is possible that the BCG has caused a false positive Quantiferon Tb gold assay, With his HIV infection and a very strong history of possible exposure to someone with active TB we have to assume that it is a true positive and that he has latent tuberculosis. Unfortunately  INH is likely to make his BCG installations less effective. After discussion, I decided to have him stop INH for now and have him complete another cycle of BCG installations. I will then wait a few weeks and give him an accelerated course of INH and rifapentine weekly for 12 weeks. I called Vidal today and instructed him to stop INH now. I will have our community outreach nurse, [name], follow up with him soon to make sure that he is taking the correct medications.

## 2016-01-09 NOTE — Telephone Encounter (Signed)
-----   Message from Ardis Hughs, MD sent at 01/01/2016 10:47 PM EDT ----- Regarding: TB Hi [name], I saw that you are treating [name] for latent TB, and I wondered if this could be from the BCG treatments that we've been giving him?  We can stop them if you think we should.  However, if we stop them,  I'm afraid he's going to recur b/c of his high grade bladder cancer - which would then mean that cystectomy would be next step.  It's always a risk to develop "BCG-osis" but rare from intravesical bladder instillations.  To my knowledge he tolerated the treatments well without evidence of being infected... Thanks for your help. Burman Nieves

## 2016-01-09 NOTE — Telephone Encounter (Signed)
Thank you Jenny Reichmann! I contacted Mr Amy and I will be seeing him tomorrow afternoon to ensure he has his medications and understands what to take

## 2016-01-10 ENCOUNTER — Ambulatory Visit: Payer: Self-pay | Admitting: *Deleted

## 2016-01-10 VITALS — BP 136/66 | HR 92 | Temp 97.5°F | Resp 17

## 2016-01-10 DIAGNOSIS — B20 Human immunodeficiency virus [HIV] disease: Secondary | ICD-10-CM

## 2016-01-10 NOTE — Progress Notes (Signed)
RN made a home visit to be sure he has a clear understanding of what medications to take. Patient has his medications that are on hold (Isoniazid, Pyridoxine) in a separate bag. RN instructed the patient to hold on to the medications and to not through them away because the plan may be to take the medication at a later date. Patient verbalized understanding and thanked me for reminded him to hold on to the medications because he was going to throw it away. Yonny is really interested in dating and asked if I knew anyone. RN instructed Jody that I cannot suggest anyone based on HIPPA but I would not suggest any women to him until he consistently takes his medication. Jadyn stated that would not be a problem because he plans on wearing a condom. RN instructed Antoinne that he should want to be as careful as possible and take his medications each day.  Patient given a bag of condoms at the end of our visit

## 2016-01-16 DIAGNOSIS — Z5111 Encounter for antineoplastic chemotherapy: Secondary | ICD-10-CM | POA: Diagnosis not present

## 2016-01-16 DIAGNOSIS — C675 Malignant neoplasm of bladder neck: Secondary | ICD-10-CM | POA: Diagnosis not present

## 2016-01-16 NOTE — Patient Instructions (Signed)
Please refer to progress note for details of today's visit 

## 2016-01-16 NOTE — Progress Notes (Signed)
RN made a home visit with the patient today(05/17). RN reviewed the patient's most recent labs and explained to him that it appears he is missing a couple of days of his medication. Patient stated he takes his medication each day that he can remember. RN asked if we could set up reminders to keep him on track with taking his medications. After a long discussion the patient ultimately stated he would love to be in a relationship with someone who is also positive. RN discussed with the patient that while preparing to date it would be a great idea to be as healthy as possible and begin to take his medication regularly. Patient stated he will be sure to wear a condom. Condoms offered at this time. RN would like to continue to see that patient to identify the reason for the patient not wanting to take his medications regularly

## 2016-01-23 DIAGNOSIS — C675 Malignant neoplasm of bladder neck: Secondary | ICD-10-CM | POA: Diagnosis not present

## 2016-01-23 DIAGNOSIS — Z5111 Encounter for antineoplastic chemotherapy: Secondary | ICD-10-CM | POA: Diagnosis not present

## 2016-01-29 ENCOUNTER — Telehealth: Payer: Self-pay | Admitting: *Deleted

## 2016-01-29 NOTE — Telephone Encounter (Signed)
RN contacted the patient to arrange a visit for this week. [name] stated he will be pretty busy on Friday but I could come by on Thursday afternoon. Visit scheduled for this Thursday

## 2016-01-31 ENCOUNTER — Other Ambulatory Visit: Payer: Medicare Other | Admitting: *Deleted

## 2016-02-04 ENCOUNTER — Other Ambulatory Visit: Payer: Medicare Other | Admitting: *Deleted

## 2016-02-07 ENCOUNTER — Ambulatory Visit: Payer: Self-pay | Admitting: *Deleted

## 2016-02-07 ENCOUNTER — Telehealth: Payer: Self-pay | Admitting: *Deleted

## 2016-02-07 VITALS — BP 122/62 | HR 84 | Temp 97.4°F | Resp 17

## 2016-02-07 DIAGNOSIS — B20 Human immunodeficiency virus [HIV] disease: Secondary | ICD-10-CM

## 2016-02-13 ENCOUNTER — Encounter: Payer: Self-pay | Admitting: *Deleted

## 2016-02-13 NOTE — Patient Instructions (Signed)
Please refer to progress note for details of today's visit 

## 2016-02-13 NOTE — Progress Notes (Signed)
RN made a home visit with [name] today. [name] stated he has been thinking about our last conversation and decided to take his medications every day. [name] stated he has not been truthful about taking his medication each day. RN complemented [name] on having a great BP today and he stated "that is because I have been taking my medication".  [name] stated he would like to live. We discussed the obstacles that he is facing and how he plans to manage them. RN offered encouragement and a listening ear during today's visit

## 2016-02-15 ENCOUNTER — Ambulatory Visit: Payer: Self-pay | Admitting: *Deleted

## 2016-02-15 DIAGNOSIS — B20 Human immunodeficiency virus [HIV] disease: Secondary | ICD-10-CM

## 2016-02-20 ENCOUNTER — Ambulatory Visit: Payer: Self-pay | Admitting: *Deleted

## 2016-02-20 DIAGNOSIS — B20 Human immunodeficiency virus [HIV] disease: Secondary | ICD-10-CM

## 2016-02-21 MED FILL — ISONIAZID 300 MG TABLET: 300 | 30 days supply | Qty: 30 | Fill #1

## 2016-02-21 MED FILL — GENVOYA TABLET: 150-150-200 | 30 days supply | Qty: 30 | Fill #1

## 2016-02-21 MED FILL — VITAMIN B-6 50 MG TABLET: 50 | 30 days supply | Qty: 30 | Fill #1

## 2016-02-29 ENCOUNTER — Telehealth: Payer: Self-pay | Admitting: *Deleted

## 2016-02-29 NOTE — Telephone Encounter (Signed)
RN did not see the patient this week so a called was made to just check in with him. Patient stated he continues to take his medication and we reviewed his upcoming appts(Date and time) RN scheduled a visit for next week

## 2016-03-03 ENCOUNTER — Ambulatory Visit: Payer: Self-pay | Admitting: *Deleted

## 2016-03-03 ENCOUNTER — Telehealth: Payer: Self-pay | Admitting: *Deleted

## 2016-03-03 ENCOUNTER — Other Ambulatory Visit: Payer: Medicare Other | Admitting: *Deleted

## 2016-03-03 VITALS — BP 112/80

## 2016-03-03 DIAGNOSIS — B20 Human immunodeficiency virus [HIV] disease: Secondary | ICD-10-CM

## 2016-03-03 NOTE — Telephone Encounter (Signed)
RN Contacted patient to confirm our visit for this morning and he asked for a visit at 11 am. Visit will be rescheduled for 11 am today instead of the prearranged time of 9:30am

## 2016-03-04 ENCOUNTER — Ambulatory Visit: Payer: Medicare Other | Admitting: Internal Medicine

## 2016-03-11 ENCOUNTER — Encounter: Payer: Self-pay | Admitting: Internal Medicine

## 2016-03-11 ENCOUNTER — Ambulatory Visit: Payer: Medicare Other

## 2016-03-11 ENCOUNTER — Ambulatory Visit (INDEPENDENT_AMBULATORY_CARE_PROVIDER_SITE_OTHER): Payer: Medicare Other | Admitting: Internal Medicine

## 2016-03-11 DIAGNOSIS — F329 Major depressive disorder, single episode, unspecified: Secondary | ICD-10-CM

## 2016-03-11 DIAGNOSIS — R799 Abnormal finding of blood chemistry, unspecified: Secondary | ICD-10-CM | POA: Diagnosis not present

## 2016-03-11 DIAGNOSIS — B20 Human immunodeficiency virus [HIV] disease: Secondary | ICD-10-CM

## 2016-03-11 DIAGNOSIS — R630 Anorexia: Secondary | ICD-10-CM | POA: Insufficient documentation

## 2016-03-11 DIAGNOSIS — F32A Depression, unspecified: Secondary | ICD-10-CM

## 2016-03-11 DIAGNOSIS — Z227 Latent tuberculosis: Secondary | ICD-10-CM

## 2016-03-11 MED ORDER — DRONABINOL 2.5 MG PO CAPS: 2.5000 mg | ORAL_CAPSULE | Freq: Two times a day (BID) | ORAL | 5 refills | Status: DC

## 2016-03-11 NOTE — Assessment & Plan Note (Signed)
He continues to be bothered by mild depression. He does not feel that he needs to meet with our counselor.

## 2016-03-11 NOTE — Assessment & Plan Note (Signed)
It sounds like his adherence has improved. I will continue Genvoya and repeat his lab work today he will follow-up in 3 months.

## 2016-03-11 NOTE — Assessment & Plan Note (Signed)
I will give him a trial of Marinol for his anorexia and intermittent nausea.

## 2016-03-11 NOTE — Assessment & Plan Note (Signed)
Once he has completed his next round of BCG bladder instillations I will give him INH and rifapentine for 3 months for his possible latent tuberculosis. This may require change in his Genvoya to an alternative antiretroviral regimen because of drug drug interactions.

## 2016-03-11 NOTE — Progress Notes (Signed)
Patient Active Problem List   Diagnosis Date Noted  . HIV disease ([place]) 04/18/2015    Priority: High  . Dysuria 05/29/2015    Priority: Medium  . Depression 04/19/2015    Priority: Medium  . 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 Malignant neoplasm of bladder ([place]) 03/30/2015    Priority: Medium  . Anorexia 03/11/2016  . Syncope 10/31/2015  . HTN (hypertension) 03/30/2015  . Asthma, chronic 03/30/2015    Patient's Medications  New Prescriptions   DRONABINOL (MARINOL) 2.5 MG CAPSULE    Take 1 capsule (2.5 mg total) by mouth 2 (two) times daily before a meal.  Previous Medications   ALBUTEROL (PROVENTIL) (2.5 MG/3ML) 0.083% NEBULIZER SOLUTION    Take 3 mLs (2.5 mg total) by nebulization every 6 (six) hours as needed for wheezing or shortness of breath.   ALBUTEROL (VENTOLIN HFA) 108 (90 BASE) MCG/ACT INHALER    Inhale 2 puffs into the lungs every 6 (six) hours as needed for wheezing or shortness of breath.   AMLODIPINE (NORVASC) 5 MG TABLET    TAKE 1 TABLET (5 MG TOTAL) BY MOUTH DAILY.   ELVITEGRAVIR-COBICISTAT-EMTRICITABINE-TENOFOVIR (GENVOYA) 150-150-200-10 MG TABS TABLET    Take 1 tablet by mouth daily with breakfast.   OXYCODONE-ACETAMINOPHEN (PERCOCET/ROXICET) 5-325 MG TABLET    Take 1-2 tablets by mouth every 6 (six) hours as needed for severe pain.   PHENAZOPYRIDINE (PYRIDIUM) 200 MG TABLET    Take 1 tablet (200 mg total) by mouth 3 (three) times daily as needed for pain.  Modified Medications   No medications on file  Discontinued Medications   No medications on file    Subjective: [name] is in for his routine HIV follow-up visit. He states that he has not missed any doses of his Genvoya since his last visit because he does not want to get sick and die. He has not restarted BCG bladder instillations yet. He has a follow-up visit with his urologist later this month and anticipates restarting BCG then. He states that the does not have  much of an appetite and has intermittent nausea. He had a week of diarrhea last month but it resolved spontaneously. He states some days he feels weak and somewhat depressed but just tries to keep going on.  Review of Systems: Review of Systems  Constitutional: Positive for malaise/fatigue. Negative for chills, diaphoresis, fever and weight loss.  HENT: Negative for sore throat.   Respiratory: Negative for cough, sputum production and shortness of breath.   Cardiovascular: Negative for chest pain.  Gastrointestinal: Positive for nausea. Negative for abdominal pain, diarrhea, heartburn and vomiting.  Genitourinary: Negative for dysuria and frequency.  Musculoskeletal: Negative for joint pain and myalgias.  Skin: Negative for rash.  Neurological: Negative for dizziness and headaches.  Psychiatric/Behavioral: Positive for depression. Negative for substance abuse. The patient is not nervous/anxious.     Past Medical History:  Diagnosis Date  . Asthma with COPD ([place])   . Bladder cancer (HCC)    and prostatic urethra cancer  HX TURBT'S IN [place], [place]  WITH INSTILLATION CHEMO TX'S  . Bladder tumor   . History of transfusion   . HIV disease ([place])    under care of [facility]  . Hypertension   . Nocturia     Social History  Substance Use Topics  . Smoking status: Current Every Day Smoker    Packs/day: 0.50  Years: 17.00    Types: Cigarettes  . Smokeless tobacco: Never Used     Comment: trying to quit  . Alcohol use 3.0 oz/week    5 Cans of beer per week     Comment: occasional    Family History  Problem Relation Age of Onset  . Cancer Mother     Gastric Cancer  . Hypertension Mother   . Heart disease Mother   . Cancer Maternal Grandmother     breast cancer     Allergies  Allergen Reactions  . Aspirin Shortness Of Breath, Nausea And Vomiting and Swelling    Objective:  Vitals:   03/11/16 0928  BP: (!) 165/75  Pulse: 68  Temp: 98.3  F (36.8 C)  TempSrc: Oral  Weight: 136 lb (61.7 kg)   Body mass index is 23.34 kg/m.  Physical Exam  Constitutional: He is oriented to person, place, and time.  HENT:  Mouth/Throat: No oropharyngeal exudate.  Eyes: Conjunctivae are normal.  Cardiovascular: Normal rate and regular rhythm.   No murmur heard. Pulmonary/Chest: Effort normal and breath sounds normal.  Abdominal: Soft. He exhibits no mass. There is no tenderness.  Musculoskeletal: Normal range of motion.  Neurological: He is alert and oriented to person, place, and time.  Skin: No rash noted.  Psychiatric: Mood and affect normal.    Lab Results Lab Results  Component Value Date   WBC 4.0 12/24/2015   HGB 12.5 (L) 12/24/2015   HCT 37.3 (L) 12/24/2015   MCV 98.9 12/24/2015   PLT 191 12/24/2015    Lab Results  Component Value Date   CREATININE 1.02 12/24/2015   BUN 9 12/24/2015   NA 136 12/24/2015   K 4.6 12/24/2015   CL 106 12/24/2015   CO2 23 12/24/2015    Lab Results  Component Value Date   ALT 19 12/24/2015   AST 41 (H) 12/24/2015   ALKPHOS 104 12/24/2015   BILITOT 0.2 12/24/2015    Lab Results  Component Value Date   CHOL 129 10/17/2015   HDL 92 10/17/2015   LDLCALC 28 10/17/2015   TRIG 43 10/17/2015   CHOLHDL 1.4 10/17/2015   HIV 1 RNA Quant (copies/mL)  Date Value  12/24/2015 246 (H)  10/17/2015 980 (H)  07/16/2015 <20   CD4 T Cell Abs (/uL)  Date Value  12/24/2015 910  10/17/2015 890  07/16/2015 740     Problem List Items Addressed This Visit      High   HIV disease (HCC)    It sounds like his adherence has improved. I will continue Genvoya and repeat his lab work today he will follow-up in 3 months.      Relevant Orders   HIV 1 RNA quant-no reflex-bld     Medium   Depression    He continues to be bothered by mild depression. He does not feel that he needs to meet with our counselor.      Latent tuberculosis by blood test    Once he has completed his next round of  BCG bladder instillations I will give him INH and rifapentine for 3 months for his possible latent tuberculosis. This may require change in his Genvoya to an alternative antiretroviral regimen because of drug drug interactions.        Unprioritized   Anorexia    I will give him a trial of Marinol for his anorexia and intermittent nausea.      Relevant Medications   dronabinol (MARINOL) 2.5 MG  capsule    Other Visit Diagnoses   None.       Michel Bickers, MD [facility] for [facility] [phone] pager   [phone] cell 03/11/2016, 9:57 AM

## 2016-03-12 LAB — HIV-1 RNA QUANT-NO REFLEX-BLD
HIV 1 RNA QUANT: 168 {copies}/mL — AB (ref <20)
HIV-1 RNA QUANT, LOG: 2.23 {Log_copies}/mL — AB (ref <1.30)

## 2016-03-18 ENCOUNTER — Encounter: Payer: Self-pay | Admitting: Family Medicine

## 2016-03-18 ENCOUNTER — Ambulatory Visit (INDEPENDENT_AMBULATORY_CARE_PROVIDER_SITE_OTHER): Payer: Medicare Other | Admitting: Family Medicine

## 2016-03-18 VITALS — BP 155/62 | HR 77 | Temp 98.7°F | Resp 16 | Ht 64.0 in | Wt 133.0 lb

## 2016-03-18 DIAGNOSIS — J4521 Mild intermittent asthma with (acute) exacerbation: Secondary | ICD-10-CM | POA: Diagnosis not present

## 2016-03-18 DIAGNOSIS — R0602 Shortness of breath: Secondary | ICD-10-CM | POA: Diagnosis not present

## 2016-03-18 DIAGNOSIS — Z131 Encounter for screening for diabetes mellitus: Secondary | ICD-10-CM

## 2016-03-21 MED FILL — ISONIAZID 300 MG TABLET: 300 | 30 days supply | Qty: 30 | Fill #2

## 2016-03-21 MED FILL — GENVOYA TABLET: 150-150-200 | 30 days supply | Qty: 30 | Fill #2

## 2016-03-21 NOTE — Progress Notes (Signed)
Called to let him know I am on the way and he cancelled today's visit and rescheduled for next week.

## 2016-03-24 NOTE — Progress Notes (Signed)
  RN performed a home visit today(02/20/2016) with Allen. [name]. Today's focus was assisting Allen. Pattillo with completing his apartment lease agreement. [name] is prideful and asked if I would assist him with completing this ppk. [name] is unable to read or write, he can sign his name. RN agreed to assist him because without housing [name] will not focus on his medication adherence.

## 2016-03-25 NOTE — Patient Instructions (Signed)
Please refer to progress note for details of today's visit 

## 2016-03-25 NOTE — Progress Notes (Signed)
RN made a home visit with the patient in his home. Patient stated he has began to take his medication each day but does not have a great appetite. Rn instructed the patient to try and eat frequent small meals. Patient currently weighs 133 pounds and he states he has never been more than 139lbs. RN asked the patient if he would like to have some Ensure on hand just in case he does not feel the need to eat one day. [name] declined stated in the past he has not been able to tolerate Ensure well. The milky consistency has hurt his stomach in the past. Together we reviewed his upcoming appts as well. [name] asked if I could fill out any new patient ppk for him prior to his appt with the primry Dr on the 15th. RN agreed to assist  After leaving [name] home RN traveled to [place] office and asked if I could fill out all new patient ppk for [name]. RN explained that [name] struggles with reading and writing. The receptionist stated they do not have any new patient ppk that would need to be completed. [name] would only need to sign his name for [place]. RN contacted [name] and made him aware of this information

## 2016-03-26 ENCOUNTER — Telehealth: Payer: Self-pay | Admitting: *Deleted

## 2016-03-26 ENCOUNTER — Ambulatory Visit: Payer: Self-pay | Admitting: *Deleted

## 2016-03-26 VITALS — BP 146/80 | Temp 98.0°F

## 2016-03-26 DIAGNOSIS — B20 Human immunodeficiency virus [HIV] disease: Secondary | ICD-10-CM

## 2016-03-26 DIAGNOSIS — F329 Major depressive disorder, single episode, unspecified: Secondary | ICD-10-CM

## 2016-03-26 DIAGNOSIS — F32A Depression, unspecified: Secondary | ICD-10-CM

## 2016-03-26 NOTE — Telephone Encounter (Signed)
PA received for Dronabinol completed and faxed.  May take 24-72 hours for approval.

## 2016-04-03 ENCOUNTER — Telehealth: Payer: Self-pay | Admitting: *Deleted

## 2016-04-17 ENCOUNTER — Ambulatory Visit: Payer: Self-pay | Admitting: *Deleted

## 2016-04-17 VITALS — BP 140/82 | HR 84 | Temp 98.2°F

## 2016-04-17 DIAGNOSIS — B20 Human immunodeficiency virus [HIV] disease: Secondary | ICD-10-CM

## 2016-04-17 DIAGNOSIS — F329 Major depressive disorder, single episode, unspecified: Secondary | ICD-10-CM

## 2016-04-17 DIAGNOSIS — F32A Depression, unspecified: Secondary | ICD-10-CM

## 2016-04-21 MED FILL — GENVOYA TABLET: 150-150-200 | 30 days supply | Qty: 30 | Fill #3

## 2016-04-23 ENCOUNTER — Encounter: Payer: Self-pay | Admitting: Hematology

## 2016-04-23 NOTE — Progress Notes (Signed)
Open in error

## 2016-04-23 NOTE — Patient Instructions (Signed)
Follow-up in 6 months, sooner if needed. Follow-up with HIV clinic as planned.

## 2016-04-23 NOTE — Progress Notes (Signed)
[name], is a 60 y.o. male  [id number]  [id number]  DOB - 10-27-1955  CC:  Chief Complaint  Patient presents with  . Establish Care  . Hypertension       HPI: [name] is a 60 y.o. male here to establish care. He has a history of hypertension, Asthma/COPD and is followed at [facility] for HIV. He has a history of surgery for bladder cancer in 2016 and has had a hip replacement. He is visited by Infectious disease RN at intervals. He reports occ shortness of breath, occasional back pain.   Health Maintenance: He declines influenza shot today. He reports his Tdap is up to date. He reports occ tobacco use and occ alcohol use. He reports having a colonoscopy within the last 2-3 months.   Allergies  Allergen Reactions  . Aspirin Shortness Of Breath, Nausea And Vomiting and Swelling   Past Medical History:  Diagnosis Date  . Asthma with COPD ([place])   . Bladder cancer ([facility])    and prostatic urethra cancer  HX TURBT'S IN [place]  WITH INSTILLATION CHEMO TX'S  . Bladder tumor   . History of transfusion   . HIV disease ([place])    under care of [place] [facility]  . Hypertension   . Nocturia    Current Outpatient Prescriptions on File Prior to Visit  Medication Sig Dispense Refill  . albuterol (PROVENTIL) (2.5 MG/3ML) 0.083% nebulizer solution Take 3 mLs (2.5 mg total) by nebulization every 6 (six) hours as needed for wheezing or shortness of breath. 75 mL 0  . albuterol (VENTOLIN HFA) 108 (90 Base) MCG/ACT inhaler Inhale 2 puffs into the lungs every 6 (six) hours as needed for wheezing or shortness of breath. 18 Inhaler 6  . amLODipine (NORVASC) 5 MG tablet TAKE 1 TABLET (5 MG TOTAL) BY MOUTH DAILY. 30 tablet 5  . dronabinol (MARINOL) 2.5 MG capsule Take 1 capsule (2.5 mg total) by mouth 2 (two) times daily before a meal. 60 capsule 5  . elvitegravir-cobicistat-emtricitabine-tenofovir (GENVOYA) 150-150-200-10 MG TABS tablet Take 1 tablet by mouth daily  with breakfast. 30 tablet 11  . oxyCODONE-acetaminophen (PERCOCET/ROXICET) 5-325 MG tablet Take 1-2 tablets by mouth every 6 (six) hours as needed for severe pain. 10 tablet 0  . phenazopyridine (PYRIDIUM) 200 MG tablet Take 1 tablet (200 mg total) by mouth 3 (three) times daily as needed for pain. (Patient not taking: Reported on 03/18/2016) 10 tablet 0   No current facility-administered medications on file prior to visit.    Family History  Problem Relation Age of Onset  . Cancer Mother     Gastric Cancer  . Hypertension Mother   . Heart disease Mother   . Cancer Maternal Grandmother     breast cancer    Social History   Social History  . Marital status: Legally Separated    Spouse name: N/A  . Number of children: N/A  . Years of education: N/A   Occupational History  . Not on file.   Social History Main Topics  . Smoking status: Current Every Day Smoker    Packs/day: 0.50    Years: 17.00    Types: Cigarettes  . Smokeless tobacco: Never Used     Comment: trying to quit  . Alcohol use 3.0 oz/week    5 Cans of beer per week     Comment: occasional  . Drug use: No  . Sexual activity: Yes    Partners: Female  Birth control/ protection: Condom     Comment: given condoms   Other Topics Concern  . Not on file   Social History Narrative  . No narrative on file    Review of Systems: Constitutional: Negative Skin: Negative HENT: Negative  Eyes: Negative  Neck: Negative Respiratory: Negative Cardiovascular: Negative Gastrointestinal: Negative Genitourinary: Negative  Musculoskeletal: Negative   Neurological: Negative for Hematological: Negative  Psychiatric/Behavioral: Negative    Objective:   Vitals:   03/18/16 1504 03/18/16 1539  BP: (!) 158/67 (!) 155/62  Pulse: 77   Resp: 16   Temp: 98.7 F (37.1 C)     Physical Exam: Constitutional: Patient appears well-developed and well-nourished. No distress. HENT: Normocephalic, atraumatic, External right  and left ear normal. Oropharynx is clear and moist.  Eyes: Conjunctivae and EOM are normal. PERRLA, no scleral icterus. Neck: Normal ROM. Neck supple. No lymphadenopathy, No thyromegaly. CVS: RRR, S1/S2 +, no murmurs, no gallops, no rubs Pulmonary: Effort and breath sounds normal, no stridor, rhonchi, wheezes, rales.  Abdominal: Soft. Normoactive BS,, no distension, tenderness, rebound or guarding.  Musculoskeletal: Normal range of motion. No edema and no tenderness.  Neuro: Alert.Normal muscle tone coordination. Non-focal Skin: Skin is warm and dry. No rash noted. Not diaphoretic. No erythema. No pallor. Psychiatric: Normal mood and affect. Behavior, judgment, thought content normal.  Lab Results  Component Value Date   WBC 4.0 12/24/2015   HGB 12.5 (L) 12/24/2015   HCT 37.3 (L) 12/24/2015   MCV 98.9 12/24/2015   PLT 191 12/24/2015   Lab Results  Component Value Date   CREATININE 1.02 12/24/2015   BUN 9 12/24/2015   NA 136 12/24/2015   K 4.6 12/24/2015   CL 106 12/24/2015   CO2 23 12/24/2015    No results found for: HGBA1C Lipid Panel     Component Value Date/Time   CHOL 129 10/17/2015 1058   TRIG 43 10/17/2015 1058   HDL 92 10/17/2015 1058   CHOLHDL 1.4 10/17/2015 1058   VLDL 9 10/17/2015 1058   LDLCALC 28 10/17/2015 1058       Assessment and plan:   1. Asthma with acute exacerbation, mild intermittent  - albuterol (PROVENTIL) (2.5 MG/3ML) 0.083% nebulizer solution; Take 3 mLs (2.5 mg total) by nebulization every 6 (six) hours as needed for wheezing or shortness of breath.  Dispense: 75 mL; Refill: 0  2. SOBOE (shortness of breath on exertion)  - albuterol (VENTOLIN HFA) 108 (90 Base) MCG/ACT inhaler; Inhale 2 puffs into the lungs every 6 (six) hours as needed for wheezing or shortness of breath.  Dispense: 18 Inhaler; Refill: 6  3. Screening for diabetes mellitus  - HgB A1c   Return in about 6 months (around 09/18/2016).  The patient was given clear  instructions to go to ER or return to medical center if symptoms don't improve, worsen or new problems develop. The patient verbalized understanding.    Micheline Chapman FNP  04/23/2016, 3:29 PM

## 2016-04-24 NOTE — Telephone Encounter (Signed)
Patient was re-evaluated on 11/08/2015 for [facility]HCNS.  Patient was consented to care at this time.    Frequency / Duration of CBHCN visits: Effective 11/08/2015:  33mo1, 36mo2, 31mo1, 3PRN's for complications with disease process/progression, medication changes or concerns  CBHCN will assess for learning needs related to diagnosis and treatment regimen, provide education as needed, fill pill box if needed, address any barriers which may be preventing medication compliance, and communicating with care team including physician and case manager. 8 Individualized Plan Of Care, Certification Period of 02/11/2016 to 05/11/2016 a. Type of service(s) and care to be delivered: [facility] Nurse b. Frequency and duration of service: Effective: 02/11/2016: 54mo1, 85mo2, 52mo1, 3 prns for complications with disease process/progression, medication changes or concerns c. Activity restrictions: Pt may be up as tolerated and can safely ambulate without the need for a assistive device. Pt wears glasses d. Safety Measures: Standard Precautions/Infection Control e. Service Objectives and Goals:Service Objectives are to assist the pt with HIV medication regimen adherence and staying in care with the Infectious Disease Clinic by identifying barriers to care. RN will address the barriers that are identified by the patient.   Patient continues to have unmet goals with adherence questionable. Pt identified a goal of getting over his Bladder Cancer and would like to be in a relationship with a male who is also positive. pt does not feel comfortable disclosing his status. Current RN goal for this patient is to offer a lot of emotional support and encouragement. RN would like to accompany the patient to his Clinic appts, with his permission, to ensure he has a understanding of information provided.  f. Equipment required: No additional equipment needs at this time g. Functional Limitations: Vision. Pt has corrective  glasses that he wears h. Rehabilitation potential: Guarded i. Diet and Nutritional Needs: Regular Diet j. Medications and treatments: Medications have been reconciled and reviewed and are a part of EPIC electronic file k. Specific therapies if needed: RN l. Pertinent diagnoses: Hypertension, Asthma, Bladder Cancer, Depression m. Expected outcome: Guarded n.

## 2016-04-24 NOTE — Progress Notes (Signed)
RN made a home visit with [name] today. Future stated he continues to take his medications each day but admits it is a struggle. [name] stated he would like to live but at times feels depressed about his health concerns. We discussed the obstacles that he is facing and how he plans to manage them. RN offered encouragement and a listening ear during today's visit along with assistance from [name]([name] Substance abuse Counselor). At this time [name] declined counseling services

## 2016-04-24 NOTE — Patient Instructions (Signed)
Please refer to progress note for details to today's vist

## 2016-04-25 NOTE — Telephone Encounter (Signed)
I approve of this plan of care. 

## 2016-04-29 ENCOUNTER — Encounter: Payer: Self-pay | Admitting: *Deleted

## 2016-04-29 NOTE — Progress Notes (Signed)
RN made a home visit with the patient today(09/13). RN reviewed the patient's most recent labs and explained to him that it appears he is missing a couple of days of his medication. Patient stated he takes his medication each day that he can remember. RN asked if we could set up reminders to keep him on track with taking his medications. After a long discussion the patient ultimately stated he would love to be in a relationship with someone who is also positive and is seeing someone at this time. RN discussed with the patient that while preparing to date it would be a great idea to be as healthy as possible and begin to take his medication regularly. Patient stated he will be sure to wear a condom. Condoms given at this time. RN would like to continue to see that patient to identify the reason for the patient not wanting to take his medications regularly my concern is that he is depressed about his life-threatning medical concerns.

## 2016-04-29 NOTE — Patient Instructions (Signed)
Please refer to progress note for details of today's visit 

## 2016-05-03 ENCOUNTER — Telehealth: Payer: Self-pay | Admitting: *Deleted

## 2016-05-05 ENCOUNTER — Telehealth: Payer: Self-pay | Admitting: *Deleted

## 2016-05-05 NOTE — Telephone Encounter (Signed)
RN informed the patient that  I will be unavailable at least the next 2 weeks. In the event that she needs my assistance she has plenty of resources available to her. RN advised the patient that [name]) will continue to provide support to her and can still be reached at [phone]. In the event of a urgent matter in which medication adherence cannot be maintained electronically, Home Care Providers are more than willing to assist. They can be reached at [phone]  Mr. Kosmicki stated he is doing well and taking his medication each day. He did state he h

## 2016-05-12 DIAGNOSIS — C678 Malignant neoplasm of overlapping sites of bladder: Secondary | ICD-10-CM | POA: Diagnosis not present

## 2016-05-20 MED FILL — GENVOYA TABLET: 150-150-200 | 30 days supply | Qty: 30 | Fill #4

## 2016-06-03 ENCOUNTER — Telehealth: Payer: Self-pay | Admitting: *Deleted

## 2016-06-03 NOTE — Telephone Encounter (Addendum)
RN contacted [name] to arrange a home visit for this week and to check back in with him. [name] stated he has a been passing blood and has loose diarrhea for since last week. RN asked [name] if I could come over and check him out. He stated No because he is currently at the barber shop to get his hair cut. [name] stated he has a AM appt with [name] and expects to be home after 1:30. RN will f/u with the patient at a later date, plan is to contact him tomorrow. At this time the patient's set frequency has changed due to this missed visit.

## 2016-06-04 DIAGNOSIS — M6281 Muscle weakness (generalized): Secondary | ICD-10-CM | POA: Diagnosis not present

## 2016-06-04 DIAGNOSIS — N3000 Acute cystitis without hematuria: Secondary | ICD-10-CM | POA: Diagnosis not present

## 2016-06-04 DIAGNOSIS — K523 Indeterminate colitis: Secondary | ICD-10-CM | POA: Diagnosis not present

## 2016-06-05 NOTE — Telephone Encounter (Signed)
Purpose of this communication is to relay that that the set frequency for home visits this week has been changed due to a missed visit. Communication has been made with the patient to ensure needs have been met and to offer a home visit. At this time a return call has not been received before the business week is out. Next week the [place] Nurse plans to continue contact with the patient to offer any services or attempt to address any needs the patient expresses that is reasonable. [name] has been verbally made aware of change in Plan of Care

## 2016-06-05 NOTE — Telephone Encounter (Signed)
Purpose of this communication is to relay that that the set frequency for home visits this week has been changed due to a missed visit. Communication has been made with the patient to ensure needs have been met and to offer a home visit. At this time a return call has not been received before the business week is out. Next week the [place] Nurse plans to continue contact with the patient to offer any services or attempt to address any needs the patient expresses that is reasonable. [name] has verbally been made aware of change in frequency.

## 2016-06-09 DIAGNOSIS — Z5111 Encounter for antineoplastic chemotherapy: Secondary | ICD-10-CM | POA: Diagnosis not present

## 2016-06-09 DIAGNOSIS — Z8551 Personal history of malignant neoplasm of bladder: Secondary | ICD-10-CM | POA: Diagnosis not present

## 2016-06-11 ENCOUNTER — Ambulatory Visit: Payer: Self-pay | Admitting: *Deleted

## 2016-06-11 ENCOUNTER — Telehealth: Payer: Self-pay | Admitting: *Deleted

## 2016-06-11 ENCOUNTER — Telehealth: Payer: Self-pay

## 2016-06-11 VITALS — BP 126/82 | HR 72 | Temp 98.1°F

## 2016-06-11 DIAGNOSIS — F329 Major depressive disorder, single episode, unspecified: Secondary | ICD-10-CM

## 2016-06-11 DIAGNOSIS — B20 Human immunodeficiency virus [HIV] disease: Secondary | ICD-10-CM

## 2016-06-11 DIAGNOSIS — F32A Depression, unspecified: Secondary | ICD-10-CM

## 2016-06-11 NOTE — Telephone Encounter (Signed)
Faxed [employer] Medicare Coverage determination form to [facility] for Dronabinol 2.5 Capsules. Copy in folder PA request folder in triage. Rodman Key, LPN

## 2016-06-11 NOTE — Progress Notes (Signed)
Counselor did not meet with patient   This encounter was created in error - please disregard.

## 2016-06-11 NOTE — Telephone Encounter (Signed)
Rn contacted [name] who agreed to a home visit for today

## 2016-06-16 DIAGNOSIS — Z5111 Encounter for antineoplastic chemotherapy: Secondary | ICD-10-CM | POA: Diagnosis not present

## 2016-06-16 DIAGNOSIS — Z8551 Personal history of malignant neoplasm of bladder: Secondary | ICD-10-CM | POA: Diagnosis not present

## 2016-06-17 ENCOUNTER — Telehealth: Payer: Self-pay | Admitting: *Deleted

## 2016-06-17 ENCOUNTER — Ambulatory Visit: Payer: Medicare Other | Admitting: Internal Medicine

## 2016-06-17 NOTE — Telephone Encounter (Signed)
Patient was unable to get through on the telephone to cancel appt.  He had a CA treatment recently and didn't feel well.  Made the patient a new appt with Dr. Megan Salon.

## 2016-06-17 NOTE — Telephone Encounter (Signed)
RN contacted [name] and he stated his was tried after Chemo on Monday and that is the reason why he missed his appt. RN discussed with [name] options for remembering his appt. We discussed phone call reminders and calendars. He stated he would like a calendar, RN offered to bring one by but he stated he will go by [employer] store and get him one. We discussed his nutrition and his ability to eat.

## 2016-06-18 MED FILL — GENVOYA TABLET: 150-150-200 | 30 days supply | Qty: 30 | Fill #5

## 2016-06-23 ENCOUNTER — Telehealth: Payer: Self-pay | Admitting: *Deleted

## 2016-06-23 DIAGNOSIS — Z5111 Encounter for antineoplastic chemotherapy: Secondary | ICD-10-CM | POA: Diagnosis not present

## 2016-06-23 DIAGNOSIS — C678 Malignant neoplasm of overlapping sites of bladder: Secondary | ICD-10-CM | POA: Diagnosis not present

## 2016-06-23 NOTE — Patient Instructions (Signed)
Please refer to progress note for details of today's visit 

## 2016-06-23 NOTE — Progress Notes (Signed)
RN performed a home visit today and ms. Sellen stated he has been seriously sick and unable to hold anything on his stomach. Mr. Willinger stated he has been placed on a antibiotic from Dr Louis Meckel but Mr. Thorn stated he was told once he feels better he should stop taking it. RN informed Mr. Bemiss that it is not likely that a Dr would say to stop a antibiotic after a couple of days since the infection will not be cured and it may increase the chance of a resistance. Mr Amadon then stated 'Well maybe he did say to continue to take it" RN agreed and urged Mr Medearis to please take his medications as ordered with food. Mr Arko verbalized understanding. At this time a recertification is needed. Mr Lammers continues to struggle with medication adherence.    RN was not available for recertification within 5 days prior or on 05/11/16. Patient was re-evaluated on 06/11/2016 for CBHCNS with the approved order from [name].      Frequency / Duration of CBHCN visits: Effective 06/11/2016:  3mo1, 93mo2, 35mo1, 3PRN's for complications with disease process/progression, medication changes or concerns  CBHCN will assess for learning needs related to diagnosis and treatment regimen, provide education as needed, fill pill box if needed, address any barriers which may be preventing medication compliance, and communicating with care team including physician and case manager. 8 Individualized Plan Of Care, Certification Period of 06/11/2016 to 09/09/2016 a. Type of service(s) and care to be delivered: [facility] Nurse b. Frequency and duration of service: Effective: 06/11/2016: 53mo1, 30mo2, 50mo1, 3 prns for complications with disease process/progression, medication changes or concerns c. Activity restrictions: Pt may be up as tolerated and can safely ambulate without the need for a assistive device. Pt wears glasses d. Safety Measures: Standard Precautions/Infection Control e. Service Objectives and Goals:Service  Objectives are to assist the pt with HIV medication regimen adherence and staying in care with the Infectious Disease Clinic by identifying barriers to care. RN will address the barriers that are identified by the patient.   Patient continues to have unmet goals with adherence questionable. Pt identified a goal of getting over his Bladder Cancer and would like to be in a relationship with a male who is also positive. pt does not feel comfortable disclosing his status. Current RN goal for this patient is to offer a lot of emotional support and encouragement. RN would like to accompany the patient to his Clinic appts, with his permission, to ensure he has a understanding of information provided.  f. Equipment required: No additional equipment needs at this time g. Functional Limitations: Vision. Pt has corrective glasses that he wears h. Rehabilitation potential: Guarded i. Diet and Nutritional Needs: Regular Diet j. Medications and treatments: Medications have been reconciled and reviewed and are a part of EPIC electronic file k. Specific therapies if needed: RN l. Pertinent diagnoses: Hypertension, Asthma, Bladder Cancer, Depression m. Expected outcome: Guarded n.

## 2016-06-23 NOTE — Telephone Encounter (Signed)
RN performed a home visit today and ms. Gossage stated he has been seriously sick and unable to hold anything on his stomach. Mr. Merle stated he has been placed on a antibiotic from Dr Louis Meckel but Mr. Goncalves stated he was told once he feels better he should stop taking it. RN informed Mr. Lengle that it is not likely that a Dr would say to stop a antibiotic after a couple of days since the infection will not be cured and it may increase the chance of a resistance. Mr Hammock then stated 'Well maybe he did say to continue to take it" RN agreed and urged Mr Kroenke to please take his medications as ordered with food. Mr Batis verbalized understanding. At this time a recertification is needed. Mr Ingrum continues to struggle with medication adherence.    RN was not available for recertification within 5 days prior or on 05/11/16. Patient was re-evaluated on 06/11/2016 for [facility]NS with the approved order from [name].      Frequency / Duration of CBHCN visits: Effective 06/11/2016:  28mo1, 68mo2, 26mo1, 3PRN's for complications with disease process/progression, medication changes or concerns  CBHCN will assess for learning needs related to diagnosis and treatment regimen, provide education as needed, fill pill box if needed, address any barriers which may be preventing medication compliance, and communicating with care team including physician and case manager. 8 Individualized Plan Of Care, Certification Period of 06/11/2016 to 09/09/2016 a. Type of service(s) and care to be delivered: [facility] Nurse b. Frequency and duration of service: Effective: 06/11/2016: 42mo1, 12mo2, 52mo1, 3 prns for complications with disease process/progression, medication changes or concerns c. Activity restrictions: Pt may be up as tolerated and can safely ambulate without the need for a assistive device. Pt wears glasses d. Safety Measures: Standard Precautions/Infection Control e. Service Objectives and Goals:Service  Objectives are to assist the pt with HIV medication regimen adherence and staying in care with the Infectious Disease Clinic by identifying barriers to care. RN will address the barriers that are identified by the patient.   Patient continues to have unmet goals with adherence questionable. Pt identified a goal of getting over his Bladder Cancer and would like to be in a relationship with a male who is also positive. pt does not feel comfortable disclosing his status. Current RN goal for this patient is to offer a lot of emotional support and encouragement. RN would like to accompany the patient to his Clinic appts, with his permission, to ensure he has a understanding of information provided.  f. Equipment required: No additional equipment needs at this time g. Functional Limitations: Vision. Pt has corrective glasses that he wears h. Rehabilitation potential: Guarded i. Diet and Nutritional Needs: Regular Diet j. Medications and treatments: Medications have been reconciled and reviewed and are a part of EPIC electronic file k. Specific therapies if needed: RN l. Pertinent diagnoses: Hypertension, Asthma, Bladder Cancer, Depression m. Expected outcome: Guarded n.

## 2016-06-23 NOTE — Telephone Encounter (Signed)
I approve of this plan of care. 

## 2016-06-24 ENCOUNTER — Telehealth: Payer: Self-pay | Admitting: *Deleted

## 2016-06-24 NOTE — Telephone Encounter (Signed)
Contacted [name] and arranged a home visit for this week. [name] stated tomorrow would be good for him. Currently he was on the way to [employer] to get grocery. Plan is to make a home visit tomorrow

## 2016-06-25 ENCOUNTER — Telehealth: Payer: Self-pay

## 2016-06-25 ENCOUNTER — Ambulatory Visit: Payer: Self-pay | Admitting: *Deleted

## 2016-06-25 VITALS — BP 132/84 | HR 78 | Temp 98.5°F

## 2016-06-25 DIAGNOSIS — B20 Human immunodeficiency virus [HIV] disease: Secondary | ICD-10-CM

## 2016-06-25 DIAGNOSIS — F329 Major depressive disorder, single episode, unspecified: Secondary | ICD-10-CM

## 2016-06-25 DIAGNOSIS — F32A Depression, unspecified: Secondary | ICD-10-CM

## 2016-06-25 DIAGNOSIS — R0602 Shortness of breath: Secondary | ICD-10-CM

## 2016-06-25 MED ORDER — ALBUTEROL SULFATE HFA 108 (90 BASE) MCG/ACT IN AERS: 2.0000 | INHALATION_SPRAY | Freq: Four times a day (QID) | RESPIRATORY_TRACT | 6 refills | Status: DC | PRN

## 2016-06-25 MED ORDER — ALBUTEROL SULFATE (2.5 MG/3ML) 0.083% IN NEBU: 2.5000 mg | INHALATION_SOLUTION | Freq: Four times a day (QID) | RESPIRATORY_TRACT | 0 refills | Status: DC | PRN

## 2016-06-25 NOTE — Telephone Encounter (Signed)
Theses have been refilled. Thanks!

## 2016-07-01 ENCOUNTER — Encounter: Payer: Self-pay | Admitting: Internal Medicine

## 2016-07-01 ENCOUNTER — Ambulatory Visit (INDEPENDENT_AMBULATORY_CARE_PROVIDER_SITE_OTHER): Payer: Medicare Other | Admitting: Internal Medicine

## 2016-07-01 DIAGNOSIS — R634 Abnormal weight loss: Secondary | ICD-10-CM

## 2016-07-01 DIAGNOSIS — R7611 Nonspecific reaction to tuberculin skin test without active tuberculosis: Secondary | ICD-10-CM

## 2016-07-01 DIAGNOSIS — F321 Major depressive disorder, single episode, moderate: Secondary | ICD-10-CM | POA: Diagnosis not present

## 2016-07-01 DIAGNOSIS — B20 Human immunodeficiency virus [HIV] disease: Secondary | ICD-10-CM | POA: Diagnosis not present

## 2016-07-01 DIAGNOSIS — Z227 Latent tuberculosis: Secondary | ICD-10-CM

## 2016-07-01 LAB — COMPREHENSIVE METABOLIC PANEL
ALBUMIN: 3.6 g/dL (ref 3.6–5.1)
ALK PHOS: 111 U/L (ref 40–115)
ALT: 14 U/L (ref 9–46)
AST: 48 U/L — AB (ref 10–35)
BILIRUBIN TOTAL: 0.4 mg/dL (ref 0.2–1.2)
BUN: 7 mg/dL (ref 7–25)
CALCIUM: 8.9 mg/dL (ref 8.6–10.3)
CO2: 21 mmol/L (ref 20–31)
Chloride: 103 mmol/L (ref 98–110)
Creat: 0.95 mg/dL (ref 0.70–1.25)
Glucose, Bld: 90 mg/dL (ref 65–99)
POTASSIUM: 4.3 mmol/L (ref 3.5–5.3)
Sodium: 132 mmol/L — ABNORMAL LOW (ref 135–146)
TOTAL PROTEIN: 8.4 g/dL — AB (ref 6.1–8.1)

## 2016-07-01 LAB — CBC
HEMATOCRIT: 38.3 % — AB (ref 38.5–50.0)
HEMOGLOBIN: 12.9 g/dL — AB (ref 13.2–17.1)
MCH: 33.3 pg — ABNORMAL HIGH (ref 27.0–33.0)
MCHC: 33.7 g/dL (ref 32.0–36.0)
MCV: 99 fL (ref 80.0–100.0)
MPV: 9.2 fL (ref 7.5–12.5)
Platelets: 181 10*3/uL (ref 140–400)
RBC: 3.87 MIL/uL — AB (ref 4.20–5.80)
RDW: 14 % (ref 11.0–15.0)
WBC: 3.3 10*3/uL — ABNORMAL LOW (ref 3.8–10.8)

## 2016-07-01 MED ORDER — MEGESTROL ACETATE 400 MG/10ML PO SUSP: 400.0000 mg | Freq: Every day | ORAL | 5 refills | Status: DC

## 2016-07-01 NOTE — Progress Notes (Signed)
Patient Active Problem List   Diagnosis Date Noted  . HIV disease ([place]) 04/18/2015    Priority: High  . Dysuria 05/29/2015    Priority: Medium  . Depression 04/19/2015    Priority: Medium  . 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 Malignant neoplasm of bladder ([place]) 03/30/2015    Priority: Medium  . Unintentional weight loss 07/01/2016  . Anorexia 03/11/2016  . Syncope 10/31/2015  . HTN (hypertension) 03/30/2015  . Asthma, chronic 03/30/2015    Patient's Medications  New Prescriptions   MEGESTROL (MEGACE) 400 MG/10ML SUSPENSION    Take 10 mLs (400 mg total) by mouth daily.  Previous Medications   ALBUTEROL (PROVENTIL) (2.5 MG/3ML) 0.083% NEBULIZER SOLUTION    Take 3 mLs (2.5 mg total) by nebulization every 6 (six) hours as needed for wheezing or shortness of breath.   ALBUTEROL (VENTOLIN HFA) 108 (90 BASE) MCG/ACT INHALER    Inhale 2 puffs into the lungs every 6 (six) hours as needed for wheezing or shortness of breath.   AMLODIPINE (NORVASC) 5 MG TABLET    TAKE 1 TABLET (5 MG TOTAL) BY MOUTH DAILY.   ELVITEGRAVIR-COBICISTAT-EMTRICITABINE-TENOFOVIR (GENVOYA) 150-150-200-10 MG TABS TABLET    Take 1 tablet by mouth daily with breakfast.   OXYCODONE-ACETAMINOPHEN (PERCOCET/ROXICET) 5-325 MG TABLET    Take 1-2 tablets by mouth every 6 (six) hours as needed for severe pain.   PHENAZOPYRIDINE (PYRIDIUM) 200 MG TABLET    Take 1 tablet (200 mg total) by mouth 3 (three) times daily as needed for pain.  Modified Medications   No medications on file  Discontinued Medications   DRONABINOL (MARINOL) 2.5 MG CAPSULE    Take 1 capsule (2.5 mg total) by mouth 2 (two) times daily before a meal.    Subjective: [name] is in for his routine HIV follow-up visit. He states that he was sick last month with nausea, vomiting, abdominal pain and weight loss. He saw his urologist, Dr. Louis Meckel and was given 2 shots and some oral medication and got better over the  next 2 weeks. During that time he had some problems keeping his Genvoya and other medications down. He has felt much better for the past month and has not missed any doses of Genvoya since getting better. He tells me that he received another bladder treatment (?BCG) yesterday. He was feeling depressed when he was sick last month but he states that he is feeling much better now. He is hoping to gain weight.   Review of Systems: Review of Systems  Constitutional: Positive for weight loss. Negative for chills, diaphoresis, fever and malaise/fatigue.  HENT: Negative for sore throat.   Respiratory: Negative for cough, sputum production and shortness of breath.   Cardiovascular: Negative for chest pain.  Gastrointestinal: Negative for abdominal pain, diarrhea, heartburn, nausea and vomiting.  Genitourinary: Positive for frequency. Negative for dysuria.  Musculoskeletal: Negative for joint pain and myalgias.  Skin: Negative for rash.  Neurological: Negative for dizziness and headaches.  Psychiatric/Behavioral: Negative for depression and substance abuse. The patient is not nervous/anxious.     Past Medical History:  Diagnosis Date  . Asthma with COPD ([place])   . Bladder cancer (HCC)    and prostatic urethra cancer  HX TURBT'S IN [place], [place]  WITH INSTILLATION CHEMO TX'S  . Bladder tumor   . History of transfusion   . HIV disease ([place])    under care of Dr.Shaquisha Wynn [facility] Salon  Infectious disease Dept  . Hypertension   . Nocturia     Social History  Substance Use Topics  . Smoking status: Current Every Day Smoker    Packs/day: 0.50    Years: 17.00    Types: Cigarettes  . Smokeless tobacco: Never Used     Comment: trying to quit  . Alcohol use 3.6 oz/week    6 Cans of beer per week     Comment: occasional    Family History  Problem Relation Age of Onset  . Cancer Mother     Gastric Cancer  . Hypertension Mother   . Heart disease Mother   . Cancer Maternal Grandmother     breast cancer      Allergies  Allergen Reactions  . Aspirin Shortness Of Breath, Nausea And Vomiting and Swelling    Objective:  Vitals:   07/01/16 1157  BP: (!) 165/92  Pulse: 99  Temp: 97.6 F (36.4 C)  TempSrc: Oral  Weight: 124 lb 8 oz (56.5 kg)  Height: 5\' 4"  (1.626 m)   Body mass index is 21.37 kg/m.  Physical Exam  Constitutional: He is oriented to person, place, and time.  He is in good spirits. He has lost 12 pounds in the last 3 months.  HENT:  Mouth/Throat: No oropharyngeal exudate.  Eyes: Conjunctivae are normal.  Cardiovascular: Normal rate and regular rhythm.   No murmur heard. Pulmonary/Chest: Effort normal and breath sounds normal. He has no wheezes. He has no rales.  Abdominal: Soft. He exhibits no mass. There is no tenderness.  Musculoskeletal: Normal range of motion.  Neurological: He is alert and oriented to person, place, and time.  Skin: No rash noted.  Psychiatric: Mood and affect normal.    Lab Results Lab Results  Component Value Date   WBC 4.0 12/24/2015   HGB 12.5 (L) 12/24/2015   HCT 37.3 (L) 12/24/2015   MCV 98.9 12/24/2015   PLT 191 12/24/2015    Lab Results  Component Value Date   CREATININE 1.02 12/24/2015   BUN 9 12/24/2015   NA 136 12/24/2015   K 4.6 12/24/2015   CL 106 12/24/2015   CO2 23 12/24/2015    Lab Results  Component Value Date   ALT 19 12/24/2015   AST 41 (H) 12/24/2015   ALKPHOS 104 12/24/2015   BILITOT 0.2 12/24/2015    Lab Results  Component Value Date   CHOL 129 10/17/2015   HDL 92 10/17/2015   LDLCALC 28 10/17/2015   TRIG 43 10/17/2015   CHOLHDL 1.4 10/17/2015   HIV 1 RNA Quant (copies/mL)  Date Value  03/11/2016 168 (H)  12/24/2015 246 (H)  10/17/2015 980 (H)   CD4 T Cell Abs (/uL)  Date Value  12/24/2015 910  10/17/2015 890  07/16/2015 740     Problem List Items Addressed This Visit      High   HIV disease ([place])    He has had several periods of time in the past year when he has not been able  to get his medication or keep it down consistently. He had low-level viral activation when last checked 3 months ago. I will continue Genvoya and repeat labs today. He will follow-up in 4 weeks.      Relevant Orders   T-helper cell (CD4)- ([name] clinic only)   HIV 1 RNA quant-no reflex-bld   CBC   Comprehensive metabolic panel     Medium   Depression    He is currently not  feeling depressed.      Latent tuberculosis by blood test    He has a positive QuantiFERON TB gold assay. It is unclear if this is due to latent tuberculosis or his BCG bladder instillations. He gives a history of exposure to a roommate in a boarding house several years ago who had severe cough and was being visited by a nurse. 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 I will ask Dr. Louis Meckel what the plan is for his BCG instillations. I would consider treating him with a 12 week course of isoniazid and rifapentine once he has completed the latest round of BCG installations.        Unprioritized   Unintentional weight loss    I'm not sure what caused him to lose so much weight when he was sick last month. I had previously tried to get Marinol for him but we could not get approval. I will see if we can obtain a Megace for him.      Relevant Medications   megestrol (MEGACE) 400 MG/10ML suspension        Michel Bickers, MD [facility] [place] [phone] pager   [phone] cell 07/01/2016, 12:21 PM

## 2016-07-01 NOTE — Assessment & Plan Note (Signed)
He has a positive QuantiFERON TB gold assay. It is unclear if this is due to latent tuberculosis or his BCG bladder instillations. He gives a history of exposure to a roommate in a boarding house several years ago who had severe cough and was being visited by a nurse. 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 I will ask Dr. Louis Meckel what the plan is for his BCG instillations. I would consider treating him with a 12 week course of isoniazid and rifapentine once he has completed the latest round of BCG installations.

## 2016-07-01 NOTE — Assessment & Plan Note (Signed)
He has had several periods of time in the past year when he has not been able to get his medication or keep it down consistently. He had low-level viral activation when last checked 3 months ago. I will continue Genvoya and repeat labs today. He will follow-up in 4 weeks.

## 2016-07-01 NOTE — Assessment & Plan Note (Signed)
I'm not sure what caused him to lose so much weight when he was sick last month. I had previously tried to get Marinol for him but we could not get approval. I will see if we can obtain a Megace for him.

## 2016-07-01 NOTE — Assessment & Plan Note (Signed)
He is currently not feeling depressed.

## 2016-07-02 ENCOUNTER — Telehealth: Payer: Self-pay | Admitting: Internal Medicine

## 2016-07-02 NOTE — Telephone Encounter (Signed)
I received this communication from Dr. Burman Nieves.  BCG  Received: Yesterday  Message Contents  Ardis Hughs, MD  Allen Bickers, MD        Rexene Edison,  We have completed the Maintenance BCG for this cycle. I will recheck his bladder in a few weeks to see how things look, however, either way we wouldn't proceed with anymore BCG for at least 6 months. I think this would be a good time to treat him for latent TB.  Thanks!  Burman Nieves

## 2016-07-03 LAB — T-HELPER CELL (CD4) - (RCID CLINIC ONLY)
CD4 T CELL ABS: 690 /uL (ref 400–2700)
CD4 T CELL HELPER: 46 % (ref 33–55)

## 2016-07-03 LAB — HIV-1 RNA QUANT-NO REFLEX-BLD
HIV 1 RNA Quant: <20 copies/mL (ref <20)
HIV-1 RNA Quant, Log: <1.3 Log copies/mL (ref <1.30)

## 2016-07-08 ENCOUNTER — Telehealth: Payer: Self-pay | Admitting: *Deleted

## 2016-07-08 NOTE — Telephone Encounter (Signed)
RN contacted and spoke with Mr Millard. We confirmed a visit for tomorrow

## 2016-07-09 ENCOUNTER — Ambulatory Visit: Payer: Self-pay | Admitting: *Deleted

## 2016-07-09 VITALS — BP 142/84 | HR 76 | Temp 96.6°F | Wt 123.6 lb

## 2016-07-09 DIAGNOSIS — B20 Human immunodeficiency virus [HIV] disease: Secondary | ICD-10-CM

## 2016-07-16 NOTE — Progress Notes (Signed)
RN made a home visit with [name] today. [name] stated he continues to take his medications each day and is not in a relationship. [name] stated since he is not longer sick he has began to eat more and feels stronger. RN encouraged [name] to continue to stay healthy by taking his medications so he can focus on fighting his cancer.  We discussed the obstacles that he is facing and how he plans to manage them. RN offered encouragement and a listening ear during today's visit along with assistance from [name]([name] Substance abuse Counselor). At this time [name] declined counseling services again.

## 2016-07-17 NOTE — Progress Notes (Signed)
RN meet with Mr Easter who stated he has been taking his medications as he should and is feeling better. Mr. Bonomi was sick for a while but is feeling much better. He admits that he has all his medications and is upset about having to pay 16 dollars for his neb treatment medications. RN informed Mr. Huitron that they may be a cost that he has to plan for. RN sat with Mr. Sarra and reviewed his latest labs that show he has been taking his medications and is currently undetectable.  RN explained to Mr. Laventure in terms that he could understand that the virus is no longer showing in his blood which means he cannot spread the virus. Mr  Asta stated "so that means it's gone" RN reminded Mr. Falsetti that he still have HIV and if he does not take his medications the virus will start to grow in his blood again. Mr Brietzke verbalized understanding. RN reiterated to Mr Pickel that the medication is making to virus go away and if he stops taking it the virus will begin to grow again. Mr Rought verbalized understanding of education provided. Mr Borke was so happy to know that his virus is currently undetectable. RN informed Mr Roye that he can choose to stay undetectable by taking his medication each day. RN informed Mr Terry that it would be a good choice to take his medications each day and focus on his cancer treatment and not having to worry about his HIV making him sick too.

## 2016-08-06 MED FILL — GENVOYA TABLET: 150-150-200 | 30 days supply | Qty: 30 | Fill #6

## 2016-08-12 ENCOUNTER — Ambulatory Visit: Payer: Medicare Other | Admitting: Internal Medicine

## 2016-08-27 ENCOUNTER — Ambulatory Visit: Payer: Self-pay | Admitting: *Deleted

## 2016-08-27 VITALS — BP 180/80 | HR 96 | Temp 96.8°F | Wt 120.8 lb

## 2016-08-27 DIAGNOSIS — B20 Human immunodeficiency virus [HIV] disease: Secondary | ICD-10-CM

## 2016-09-03 MED FILL — GENVOYA TABLET: 150-150-200 | 30 days supply | Qty: 30 | Fill #7 | Status: TO

## 2016-09-09 ENCOUNTER — Inpatient Hospital Stay (HOSPITAL_COMMUNITY)
Admission: EM | Admit: 2016-09-09 | Discharge: 2016-09-12 | DRG: 202 | Disposition: A | Discharge status: Home / Self Care | Payer: Medicare Other | Attending: Internal Medicine | Admitting: Internal Medicine

## 2016-09-09 ENCOUNTER — Emergency Department (HOSPITAL_COMMUNITY): Payer: Medicare Other

## 2016-09-09 ENCOUNTER — Encounter (HOSPITAL_COMMUNITY): Payer: Self-pay | Admitting: Emergency Medicine

## 2016-09-09 DIAGNOSIS — R109 Unspecified abdominal pain: Secondary | ICD-10-CM | POA: Diagnosis not present

## 2016-09-09 DIAGNOSIS — J9601 Acute respiratory failure with hypoxia: Secondary | ICD-10-CM | POA: Diagnosis not present

## 2016-09-09 DIAGNOSIS — R0682 Tachypnea, not elsewhere classified: Secondary | ICD-10-CM | POA: Diagnosis not present

## 2016-09-09 DIAGNOSIS — Z886 Allergy status to analgesic agent status: Secondary | ICD-10-CM | POA: Diagnosis not present

## 2016-09-09 DIAGNOSIS — R509 Fever, unspecified: Secondary | ICD-10-CM

## 2016-09-09 DIAGNOSIS — Z96643 Presence of artificial hip joint, bilateral: Secondary | ICD-10-CM | POA: Diagnosis present

## 2016-09-09 DIAGNOSIS — B20 Human immunodeficiency virus [HIV] disease: Secondary | ICD-10-CM | POA: Diagnosis not present

## 2016-09-09 DIAGNOSIS — R0602 Shortness of breath: Secondary | ICD-10-CM | POA: Diagnosis not present

## 2016-09-09 DIAGNOSIS — Z9841 Cataract extraction status, right eye: Secondary | ICD-10-CM | POA: Diagnosis not present

## 2016-09-09 DIAGNOSIS — Z800 Family history of malignant neoplasm of digestive organs: Secondary | ICD-10-CM

## 2016-09-09 DIAGNOSIS — Z803 Family history of malignant neoplasm of breast: Secondary | ICD-10-CM

## 2016-09-09 DIAGNOSIS — R0902 Hypoxemia: Secondary | ICD-10-CM | POA: Diagnosis not present

## 2016-09-09 DIAGNOSIS — F17210 Nicotine dependence, cigarettes, uncomplicated: Secondary | ICD-10-CM | POA: Diagnosis present

## 2016-09-09 DIAGNOSIS — E872 Acidosis: Secondary | ICD-10-CM | POA: Diagnosis present

## 2016-09-09 DIAGNOSIS — J45901 Unspecified asthma with (acute) exacerbation: Secondary | ICD-10-CM | POA: Diagnosis not present

## 2016-09-09 DIAGNOSIS — Z961 Presence of intraocular lens: Secondary | ICD-10-CM | POA: Diagnosis present

## 2016-09-09 DIAGNOSIS — J449 Chronic obstructive pulmonary disease, unspecified: Secondary | ICD-10-CM | POA: Diagnosis present

## 2016-09-09 DIAGNOSIS — Z79899 Other long term (current) drug therapy: Secondary | ICD-10-CM | POA: Diagnosis not present

## 2016-09-09 DIAGNOSIS — J90 Pleural effusion, not elsewhere classified: Secondary | ICD-10-CM | POA: Diagnosis not present

## 2016-09-09 DIAGNOSIS — D649 Anemia, unspecified: Secondary | ICD-10-CM | POA: Diagnosis present

## 2016-09-09 DIAGNOSIS — R000 Tachycardia, unspecified: Secondary | ICD-10-CM | POA: Diagnosis present

## 2016-09-09 DIAGNOSIS — Z8249 Family history of ischemic heart disease and other diseases of the circulatory system: Secondary | ICD-10-CM

## 2016-09-09 DIAGNOSIS — E222 Syndrome of inappropriate secretion of antidiuretic hormone: Secondary | ICD-10-CM | POA: Diagnosis present

## 2016-09-09 DIAGNOSIS — Z8551 Personal history of malignant neoplasm of bladder: Secondary | ICD-10-CM

## 2016-09-09 DIAGNOSIS — Z9842 Cataract extraction status, left eye: Secondary | ICD-10-CM

## 2016-09-09 DIAGNOSIS — R0603 Acute respiratory distress: Secondary | ICD-10-CM

## 2016-09-09 DIAGNOSIS — I10 Essential (primary) hypertension: Secondary | ICD-10-CM | POA: Diagnosis present

## 2016-09-09 LAB — CBC WITH DIFFERENTIAL/PLATELET
BASOS ABS: 0 10*3/uL (ref 0.0–0.1)
BASOS PCT: 1 %
Eosinophils Absolute: 0 10*3/uL (ref 0.0–0.7)
Eosinophils Relative: 1 %
HEMATOCRIT: 32.7 % — AB (ref 39.0–52.0)
Hemoglobin: 11.2 g/dL — ABNORMAL LOW (ref 13.0–17.0)
LYMPHS PCT: 17 %
Lymphs Abs: 0.8 10*3/uL (ref 0.7–4.0)
MCH: 34.3 pg — ABNORMAL HIGH (ref 26.0–34.0)
MCHC: 34.3 g/dL (ref 30.0–36.0)
MCV: 100 fL (ref 78.0–100.0)
Monocytes Absolute: 0.2 10*3/uL (ref 0.1–1.0)
Monocytes Relative: 4 %
NEUTROS ABS: 3.6 10*3/uL (ref 1.7–7.7)
Neutrophils Relative %: 77 %
PLATELETS: 198 10*3/uL (ref 150–400)
RBC: 3.27 MIL/uL — AB (ref 4.22–5.81)
RDW: 13.4 % (ref 11.5–15.5)
WBC: 4.6 10*3/uL (ref 4.0–10.5)

## 2016-09-09 LAB — BASIC METABOLIC PANEL
ANION GAP: 11 (ref 5–15)
BUN: 5 mg/dL — ABNORMAL LOW (ref 6–20)
CO2: 16 mmol/L — AB (ref 22–32)
Calcium: 8.2 mg/dL — ABNORMAL LOW (ref 8.9–10.3)
Chloride: 103 mmol/L (ref 101–111)
Creatinine, Ser: 0.93 mg/dL (ref 0.61–1.24)
GLUCOSE: 137 mg/dL — AB (ref 65–99)
POTASSIUM: 3.3 mmol/L — AB (ref 3.5–5.1)
Sodium: 130 mmol/L — ABNORMAL LOW (ref 135–145)

## 2016-09-09 LAB — LACTIC ACID, PLASMA: Lactic Acid, Venous: 2.5 mmol/L (ref 0.5–1.9)

## 2016-09-09 MED ORDER — ONDANSETRON HCL 4 MG PO TABS: 4.0000 mg | ORAL_TABLET | Freq: Four times a day (QID) | ORAL | Status: DC | PRN

## 2016-09-09 MED ORDER — ENOXAPARIN SODIUM 40 MG/0.4ML ~~LOC~~ SOLN
40.0000 mg | Freq: Every day | SUBCUTANEOUS | Status: DC
  Administered 2016-09-10 – 2016-09-12 (×3): 40 mg via SUBCUTANEOUS
  Filled 2016-09-09 (×3): qty 0.4

## 2016-09-09 MED ORDER — LEVALBUTEROL HCL 0.63 MG/3ML IN NEBU: 0.6300 mg | INHALATION_SOLUTION | RESPIRATORY_TRACT | Status: DC | PRN

## 2016-09-09 MED ORDER — SODIUM CHLORIDE 0.9 % IV BOLUS (SEPSIS)
2000.0000 mL | Freq: Once | INTRAVENOUS | Status: AC
  Administered 2016-09-09: 2000 mL via INTRAVENOUS

## 2016-09-09 MED ORDER — DEXTROSE 5 % IV SOLN
500.0000 mg | Freq: Once | INTRAVENOUS | Status: AC
  Administered 2016-09-09: 500 mg via INTRAVENOUS
  Filled 2016-09-09: qty 500

## 2016-09-09 MED ORDER — ACETAMINOPHEN 325 MG PO TABS
650.0000 mg | ORAL_TABLET | Freq: Once | ORAL | Status: AC
  Administered 2016-09-09: 650 mg via ORAL
  Filled 2016-09-09: qty 2

## 2016-09-09 MED ORDER — METHYLPREDNISOLONE SODIUM SUCC 40 MG IJ SOLR
40.0000 mg | Freq: Two times a day (BID) | INTRAMUSCULAR | Status: DC
  Administered 2016-09-10 – 2016-09-12 (×5): 40 mg via INTRAVENOUS
  Filled 2016-09-09 (×5): qty 1

## 2016-09-09 MED ORDER — ACETAMINOPHEN 325 MG PO TABS: 650.0000 mg | ORAL_TABLET | Freq: Four times a day (QID) | ORAL | Status: DC | PRN

## 2016-09-09 MED ORDER — ELVITEG-COBIC-EMTRICIT-TENOFAF 150-150-200-10 MG PO TABS
1.0000 | ORAL_TABLET | Freq: Every day | ORAL | Status: DC
  Administered 2016-09-10 – 2016-09-12 (×3): 1 via ORAL
  Filled 2016-09-09 (×4): qty 1

## 2016-09-09 MED ORDER — SODIUM CHLORIDE 0.9 % IV BOLUS (SEPSIS)
1000.0000 mL | Freq: Once | INTRAVENOUS | Status: AC
  Administered 2016-09-09: 1000 mL via INTRAVENOUS

## 2016-09-09 MED ORDER — MAGNESIUM SULFATE 2 GM/50ML IV SOLN
2.0000 g | Freq: Once | INTRAVENOUS | Status: AC
  Administered 2016-09-09: 2 g via INTRAVENOUS
  Filled 2016-09-09: qty 50

## 2016-09-09 MED ORDER — OXYCODONE-ACETAMINOPHEN 5-325 MG PO TABS
1.0000 | ORAL_TABLET | Freq: Four times a day (QID) | ORAL | Status: DC | PRN
  Administered 2016-09-11: 2 via ORAL
  Administered 2016-09-12: 1 via ORAL
  Filled 2016-09-09: qty 1
  Filled 2016-09-09: qty 2

## 2016-09-09 MED ORDER — AMLODIPINE BESYLATE 5 MG PO TABS
5.0000 mg | ORAL_TABLET | Freq: Every day | ORAL | Status: DC
  Administered 2016-09-10 – 2016-09-12 (×3): 5 mg via ORAL
  Filled 2016-09-09 (×3): qty 1

## 2016-09-09 MED ORDER — LEVALBUTEROL HCL 0.63 MG/3ML IN NEBU
0.6300 mg | INHALATION_SOLUTION | RESPIRATORY_TRACT | Status: DC
  Administered 2016-09-10 (×4): 0.63 mg via RESPIRATORY_TRACT
  Filled 2016-09-09 (×4): qty 3

## 2016-09-09 MED ORDER — BUDESONIDE 0.25 MG/2ML IN SUSP
0.2500 mg | Freq: Two times a day (BID) | RESPIRATORY_TRACT | Status: DC
  Administered 2016-09-10 – 2016-09-12 (×6): 0.25 mg via RESPIRATORY_TRACT
  Filled 2016-09-09 (×7): qty 2

## 2016-09-09 MED ORDER — ONDANSETRON HCL 4 MG/2ML IJ SOLN: 4.0000 mg | Freq: Four times a day (QID) | INTRAMUSCULAR | Status: DC | PRN

## 2016-09-09 MED ORDER — LEVOFLOXACIN IN D5W 750 MG/150ML IV SOLN
750.0000 mg | Freq: Every day | INTRAVENOUS | Status: DC
  Administered 2016-09-10 – 2016-09-12 (×3): 750 mg via INTRAVENOUS
  Filled 2016-09-09 (×3): qty 150

## 2016-09-09 MED ORDER — MEGESTROL ACETATE 400 MG/10ML PO SUSP
400.0000 mg | Freq: Every day | ORAL | Status: DC
  Administered 2016-09-10 – 2016-09-12 (×3): 400 mg via ORAL
  Filled 2016-09-09 (×3): qty 10

## 2016-09-09 MED ORDER — ACETAMINOPHEN 650 MG RE SUPP: 650.0000 mg | Freq: Four times a day (QID) | RECTAL | Status: DC | PRN

## 2016-09-09 MED ORDER — ALBUTEROL (5 MG/ML) CONTINUOUS INHALATION SOLN
10.0000 mg/h | INHALATION_SOLUTION | Freq: Once | RESPIRATORY_TRACT | Status: AC
  Administered 2016-09-09: 10 mg/h via RESPIRATORY_TRACT
  Filled 2016-09-09: qty 20

## 2016-09-09 NOTE — H&P (Addendum)
History and Physical    [name] [id number] DOB: Sep 30, 1955 DOA: 09/09/2016  PCP: Sharon Seller, NP  Patient coming from: Home.  Chief Complaint: Shortness of breath.  HPI: [name] is a 61 y.o. male with asthma, hypertension and HIV bladder cancer presents to the ER because of worsening shortness of breath over the last 4-5 days. Patient also has been a productive cough. Denies any chest pain though has had chest tightness. In the ER chest x-ray does not show any infiltrates. Patient is found to be actively wheezing. Labs revealed metabolic acidosis. Patient is being admitted for acute respiratory failure secondary to asthma. Patient states that last week he had multiple bouts of diarrhea but has stopped and has not had any for last 4 days. Denies any vomiting or abdominal pain.  ED Course: Patient was given steroids nebulizer and antibiotics.  Review of Systems: As per HPI, rest all negative.   Past Medical History:  Diagnosis Date  . Asthma with COPD ([place])   . Bladder cancer (HCC)    and prostatic urethra cancer  HX TURBT'S IN [place], [place]  WITH INSTILLATION CHEMO TX'S  . Bladder tumor   . History of transfusion   . HIV disease ([place])    under care of [facility]  . Hypertension   . Nocturia     Past Surgical History:  Procedure Laterality Date  . CATARACT EXTRACTION W/ INTRAOCULAR LENS  IMPLANT, BILATERAL  2014  . FULGURATION OF BLADDER TUMOR Bilateral 05/31/2014   Procedure: BLADDER BIOPSY WITH FULGERATION BILATERAL RETROGRADE [id number];  Surgeon: Ardis Hughs, MD;  Location[facility];  Service: Urology;  Laterality: Bilateral;  . JOINT REPLACEMENT    . LUMBAR DISC SURGERY  x2  last one 2008  . TOTAL HIP ARTHROPLASTY Bilateral x each with revision's   last one for Right 2012/  last one for Left 2010  . TRANSURETHRAL RESECTION OF BLADDER TUMOR  X3   last one 02/ 2015 ([place], [place])  . TRANSURETHRAL RESECTION  OF BLADDER TUMOR WITH GYRUS (TURBT-GYRUS) Bilateral 05/16/2015   Procedure: TRANSURETHRAL RESECTION OF BLADDER TUMOR WITH GYRUS (TURBT-GYRUS), bilateral retrograde pyelogram;  Surgeon: Ardis Hughs, MD;  Location: [facility];  Service: Urology;  Laterality: Bilateral;  . TRANSURETHRAL RESECTION OF BLADDER TUMOR WITH GYRUS (TURBT-GYRUS) N/A 07/05/2015   Procedure: RE RESECTION OF BLADDER TUMOR (TURBT) RIGHT BLADDER NECK;  Surgeon: Ardis Hughs, MD;  Location: [facility];  Service: Urology;  Laterality: N/A;     reports that he has been smoking Cigarettes.  He has a 8.50 pack-year smoking history. He has never used smokeless tobacco. He reports that he drinks about 3.6 oz of alcohol per week . He reports that he does not use drugs.  Allergies  Allergen Reactions  . Aspirin Shortness Of Breath, Nausea And Vomiting and Swelling    Family History  Problem Relation Age of Onset  . Cancer Mother     Gastric Cancer  . Hypertension Mother   . Heart disease Mother   . Cancer Maternal Grandmother     breast cancer     Prior to Admission medications   Medication Sig Start Date End Date Taking? Authorizing Provider  albuterol (PROVENTIL) (2.5 MG/3ML) 0.083% nebulizer solution Take 3 mLs (2.5 mg total) by nebulization every 6 (six) hours as needed for wheezing or shortness of breath. 06/25/16   Micheline Chapman, NP  albuterol (VENTOLIN HFA) 108 (90 Base) MCG/ACT inhaler Inhale 2 puffs  into the lungs every 6 (six) hours as needed for wheezing or shortness of breath. 06/25/16   Micheline Chapman, NP  amLODipine (NORVASC) 5 MG tablet TAKE 1 TABLET (5 MG TOTAL) BY MOUTH DAILY. 06/05/15   Michel Bickers, MD  elvitegravir-cobicistat-emtricitabine-tenofovir (GENVOYA) 150-150-200-10 MG TABS tablet Take 1 tablet by mouth daily with breakfast. 01/01/16   Michel Bickers, MD  megestrol (MEGACE) 400 MG/10ML suspension Take 10 mLs (400 mg total) by mouth daily. 07/01/16   Michel Bickers, MD  oxyCODONE-acetaminophen  (PERCOCET/ROXICET) 5-325 MG tablet Take 1-2 tablets by mouth every 6 (six) hours as needed for severe pain. 07/05/15   Ardis Hughs, MD  phenazopyridine (PYRIDIUM) 200 MG tablet Take 1 tablet (200 mg total) by mouth 3 (three) times daily as needed for pain. Patient not taking: Reported on 03/18/2016 07/05/15   Ardis Hughs, MD    Physical Exam: Vitals:   09/09/16 2230 09/09/16 2245 09/09/16 2300 09/09/16 2315  BP: 140/66 144/64 161/60 138/57  Pulse: (!) 128 (!) 124 (!) 124 118  Resp: 26 26 21 24   Temp:      TempSrc:      SpO2: 98% 97% 98% 97%      Constitutional: Poorly built and nourished. Vitals:   09/09/16 2230 09/09/16 2245 09/09/16 2300 09/09/16 2315  BP: 140/66 144/64 161/60 138/57  Pulse: (!) 128 (!) 124 (!) 124 118  Resp: 26 26 21 24   Temp:      TempSrc:      SpO2: 98% 97% 98% 97%   Eyes: Anicteric no pallor. ENMT: No discharge from the ears eyes nose or mouth. Neck: No mass felt. No JVD appreciated. Respiratory: Bilateral air entry present no rhonchi or crepitations. Cardiovascular: S1-S2 no murmurs appreciated. Abdomen: Soft nontender bowel sounds present. No guarding or rigidity. Musculoskeletal: No edema. No joint effusion. Skin: No rash. Skin appears warm. Neurologicalert awake oriented to time place and person. Moves all extremities. Psychiatric: Appears normal. Normal affect.   L Alert and on Admission: I have personally reviewed following labs and imaging studies  CBC:  Recent Labs Lab 09/09/16 2109  WBC 4.6  NEUTROABS 3.6  HGB 11.2*  HCT 32.7*  MCV 100.0  PLT 99991111   Basic Metabolic Panel:  Recent Labs Lab 09/09/16 2109  NA 130*  K 3.3*  CL 103  CO2 16*  GLUCOSE 137*  BUN 5*  CREATININE 0.93  CALCIUM 8.2*   GFR: Estimated Creatinine Clearance: 63.8 mL/min (by C-G formula based on SCr of 0.93 mg/dL). Liver Function Tests: No results for input(s): AST, ALT, ALKPHOS, BILITOT, PROT, ALBUMIN in the last 168 hours. No results  for input(s): LIPASE, AMYLASE in the last 168 hours. No results for input(s): AMMONIA in the last 168 hours. Coagulation Profile: No results for input(s): INR, PROTIME in the last 168 hours. Cardiac Enzymes: No results for input(s): CKTOTAL, CKMB, CKMBINDEX, TROPONINI in the last 168 hours. BNP (last 3 results) No results for input(s): PROBNP in the last 8760 hours. HbA1C: No results for input(s): HGBA1C in the last 72 hours. CBG: No results for input(s): GLUCAP in the last 168 hours. Lipid Profile: No results for input(s): CHOL, HDL, LDLCALC, TRIG, CHOLHDL, LDLDIRECT in the last 72 hours. Thyroid Function Tests: No results for input(s): TSH, T4TOTAL, FREET4, T3FREE, THYROIDAB in the last 72 hours. Anemia Panel: No results for input(s): VITAMINB12, FOLATE, FERRITIN, TIBC, IRON, RETICCTPCT in the last 72 hours. Urine analysis:    Component Value Date/Time   COLORURINE ORANGE (A)  05/29/2015 [place] 05/29/2015 1102   LABSPEC 1.016 05/29/2015 1102   PHURINE 6.5 05/29/2015 1102   GLUCOSEU NEGATIVE 05/29/2015 1102   HGBUR 3+ (A) 05/29/2015 1102   BILIRUBINUR NEGATIVE 05/29/2015 [place] 05/29/2015 1102   PROTEINUR 1+ (A) 05/29/2015 1102   UROBILINOGEN 0.2 04/10/2014 1836   NITRITE POSITIVE (A) 05/29/2015 1102   LEUKOCYTESUR 2+ (A) 05/29/2015 1102   Sepsis Labs: @LABRCNTIP (procalcitonin:4,lacticidven:4) )No results found for this or any previous visit (from the past 240 hour(s)).   Radiological Exams on Admission: Dg Chest Port 1 View  Result Date: 09/09/2016 CLINICAL DATA:  Concern for pneumonia. History of asthma, COPD, and HIV. EXAM: PORTABLE CHEST 1 VIEW COMPARISON:  10/31/2015 FINDINGS: The cardiomediastinal silhouette is within normal limits. The lungs remain hyperinflated without evidence of airspace consolidation, edema, pleural effusion, or pneumothorax. No acute osseous abnormality is seen. IMPRESSION: No active disease. Electronically  Signed   By: Logan Bores M.D.   On: 09/09/2016 20:48    EKG: Sinus tachycardia.  Assessment/Plan Principal Problem:   Acute respiratory failure with hypoxia (HCC) Active Problems:   HTN (hypertension)   HIV disease (HCC)   Exacerbation of asthma    1. Acute respiratory failure with hypoxia probably secondary to asthma exacerbation - will keep patient on nebulizer Pulmicort IV steroids and patient also has productive cough place patient on antibiotics. Since patient is significantly tachycardic I have ordered a CT angiogram of the chest. Following influenza PCR. No signs of PCP. 2. Metabolic acidosis - not sure what is causing this. Patient has had diarrhea last week and no further episodes this week. Denies any vomiting. Will check ABG and lactate levels and place patient on IV fluids. 3. HIV last CD4 count in November 2017 was 690. Continue antiretrovirals. 4. Hypertension on amlodipine. 5. History of bladder cancer - patient is supposed to get chemotherapy later this month. 6. History of latent tuberculosis as per infectious disease - Dr. Megan Salon is planning treating latent tuberculosis after the treatment for bladder cancer. 7. Chronic anemia - follow CBC.  Addendum - patient's ABG shows pH of 7.28 with lactate of 5.9. Patient is tachycardic. I have discussed with Dr. Learta Codding, pulmonary critical care and was advised to continue with fluid bolus and recheck lactate and ABG. I have also ordered procalcitonin levels.   DVT prophylaxis: Lovenox. Code Status: Full code.  Family Communication: Discussed with patient.  Disposition Plan: Home.  Consults called: Discussed with pulmonary critical care.  Admission status: Inpatient.    Rise Patience MD [facility] Pager [phone].  If 7PM-7AM, please contact night-coverage [email or web address] Password [facility]  09/09/2016, 11:33 PM

## 2016-09-09 NOTE — ED Triage Notes (Signed)
Pt presents from home with [facility] for worsening SOB over few days; pt states been using inhaler more frequently and it ran out today; EMS reports initial resp rate of 36 and wheezing all over, speaking in broken sentences, and visible retractions; EMS gave 2 neb treamtments ( 5mg  albuterol and 0.5mg  atrovent) also 125mg  solumedrol; EMS reported improvement in WOB with interventions

## 2016-09-09 NOTE — ED Notes (Addendum)
ED Provider at bedside. MD and MD Resident
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
## 2016-09-09 NOTE — ED Notes (Signed)
Pt receiving hour long neb at this time.

## 2016-09-10 ENCOUNTER — Inpatient Hospital Stay (HOSPITAL_COMMUNITY): Payer: Medicare Other

## 2016-09-10 ENCOUNTER — Encounter (HOSPITAL_COMMUNITY): Payer: Self-pay | Admitting: Radiology

## 2016-09-10 DIAGNOSIS — J9601 Acute respiratory failure with hypoxia: Secondary | ICD-10-CM

## 2016-09-10 DIAGNOSIS — J45901 Unspecified asthma with (acute) exacerbation: Principal | ICD-10-CM

## 2016-09-10 DIAGNOSIS — B20 Human immunodeficiency virus [HIV] disease: Secondary | ICD-10-CM

## 2016-09-10 DIAGNOSIS — I10 Essential (primary) hypertension: Secondary | ICD-10-CM

## 2016-09-10 LAB — CBC
HCT: 31.8 % — ABNORMAL LOW (ref 39.0–52.0)
HCT: 30.8 % — ABNORMAL LOW (ref 39.0–52.0)
Hemoglobin: 10.9 g/dL — ABNORMAL LOW (ref 13.0–17.0)
Hemoglobin: 10.3 g/dL — ABNORMAL LOW (ref 13.0–17.0)
MCH: 34.4 pg — ABNORMAL HIGH (ref 26.0–34.0)
MCH: 33 pg (ref 26.0–34.0)
MCHC: 34.3 g/dL (ref 30.0–36.0)
MCHC: 33.4 g/dL (ref 30.0–36.0)
MCV: 100.3 fL — AB (ref 78.0–100.0)
MCV: 98.7 fL (ref 78.0–100.0)
PLATELETS: 230 10*3/uL (ref 150–400)
PLATELETS: 226 10*3/uL (ref 150–400)
RBC: 3.17 MIL/uL — AB (ref 4.22–5.81)
RBC: 3.12 MIL/uL — AB (ref 4.22–5.81)
RDW: 13 % (ref 11.5–15.5)
RDW: 12.9 % (ref 11.5–15.5)
WBC: 4.5 10*3/uL (ref 4.0–10.5)
WBC: 4.2 10*3/uL (ref 4.0–10.5)

## 2016-09-10 LAB — BASIC METABOLIC PANEL
ANION GAP: 11 (ref 5–15)
ANION GAP: 11 (ref 5–15)
BUN: <5 mg/dL — ABNORMAL LOW (ref 6–20)
CALCIUM: 8.2 mg/dL — AB (ref 8.9–10.3)
CO2: 14 mmol/L — AB (ref 22–32)
CO2: 17 mmol/L — ABNORMAL LOW (ref 22–32)
CREATININE: 1.09 mg/dL (ref 0.61–1.24)
CREATININE: 0.75 mg/dL (ref 0.61–1.24)
Calcium: 8.2 mg/dL — ABNORMAL LOW (ref 8.9–10.3)
Chloride: 105 mmol/L (ref 101–111)
Chloride: 105 mmol/L (ref 101–111)
GFR calc Af Amer: >60 mL/min (ref >60)
GFR calc Af Amer: >60 mL/min (ref >60)
GLUCOSE: 199 mg/dL — AB (ref 65–99)
GLUCOSE: 162 mg/dL — AB (ref 65–99)
Potassium: 3.6 mmol/L (ref 3.5–5.1)
Potassium: 3.7 mmol/L (ref 3.5–5.1)
Sodium: 130 mmol/L — ABNORMAL LOW (ref 135–145)
Sodium: 133 mmol/L — ABNORMAL LOW (ref 135–145)

## 2016-09-10 LAB — BLOOD GAS, ARTERIAL
ACID-BASE DEFICIT: 8.4 mmol/L — AB (ref 0.0–2.0)
Bicarbonate: 15.8 mmol/L — ABNORMAL LOW (ref 20.0–28.0)
DRAWN BY: 418751
O2 Content: 4 L/min
O2 Saturation: 98.4 %
PCO2 ART: 27.7 mmHg — AB (ref 32.0–48.0)
PH ART: 7.375 (ref 7.350–7.450)
Patient temperature: 98.6
pO2, Arterial: 125 mmHg — ABNORMAL HIGH (ref 83.0–108.0)

## 2016-09-10 LAB — TROPONIN I

## 2016-09-10 LAB — TSH: TSH: 0.944 u[IU]/mL (ref 0.350–4.500)

## 2016-09-10 LAB — LACTIC ACID, PLASMA
LACTIC ACID, VENOUS: 5.9 mmol/L — AB (ref 0.5–1.9)
LACTIC ACID, VENOUS: 2.4 mmol/L — AB (ref 0.5–1.9)
Lactic Acid, Venous: 1.8 mmol/L (ref 0.5–1.9)

## 2016-09-10 LAB — INFLUENZA PANEL BY PCR (TYPE A & B)
Influenza A By PCR: NEGATIVE
Influenza B By PCR: NEGATIVE

## 2016-09-10 LAB — I-STAT ARTERIAL BLOOD GAS, ED
ACID-BASE DEFICIT: 13 mmol/L — AB (ref 0.0–2.0)
Bicarbonate: 12.4 mmol/L — ABNORMAL LOW (ref 20.0–28.0)
O2 Saturation: 98 %
PH ART: 7.282 — AB (ref 7.350–7.450)
TCO2: 13 mmol/L (ref 0–100)
pCO2 arterial: 26.3 mmHg — ABNORMAL LOW (ref 32.0–48.0)
pO2, Arterial: 106 mmHg (ref 83.0–108.0)

## 2016-09-10 LAB — BRAIN NATRIURETIC PEPTIDE: B Natriuretic Peptide: 62.5 pg/mL (ref 0.0–100.0)

## 2016-09-10 LAB — SALICYLATE LEVEL

## 2016-09-10 LAB — PROCALCITONIN

## 2016-09-10 MED ORDER — LEVALBUTEROL HCL 0.63 MG/3ML IN NEBU
0.6300 mg | INHALATION_SOLUTION | Freq: Four times a day (QID) | RESPIRATORY_TRACT | Status: DC
  Administered 2016-09-10 – 2016-09-12 (×8): 0.63 mg via RESPIRATORY_TRACT
  Filled 2016-09-10 (×9): qty 3

## 2016-09-10 MED ORDER — SODIUM CHLORIDE 0.9 % IV BOLUS (SEPSIS): 1000.0000 mL | Freq: Once | INTRAVENOUS | Status: DC

## 2016-09-10 MED ORDER — LEVALBUTEROL HCL 0.63 MG/3ML IN NEBU
0.6300 mg | INHALATION_SOLUTION | RESPIRATORY_TRACT | Status: DC | PRN
Start: 2016-09-10 — End: 2016-09-12

## 2016-09-10 MED ORDER — SODIUM CHLORIDE 0.9 % IV SOLN
INTRAVENOUS | Status: DC
  Administered 2016-09-10: 07:00:00 via INTRAVENOUS

## 2016-09-10 MED ORDER — IOPAMIDOL (ISOVUE-370) INJECTION 76%
INTRAVENOUS | Status: AC
  Administered 2016-09-10: 100 mL
  Filled 2016-09-10: qty 100

## 2016-09-10 MED ORDER — SODIUM CHLORIDE 0.9 % IV SOLN
INTRAVENOUS | Status: DC
  Administered 2016-09-10: 16:00:00 via INTRAVENOUS

## 2016-09-10 MED ORDER — METHYLPREDNISOLONE SODIUM SUCC 40 MG IJ SOLR
20.0000 mg | Freq: Once | INTRAMUSCULAR | Status: AC
  Administered 2016-09-10: 20 mg via INTRAVENOUS

## 2016-09-10 MED ORDER — SODIUM CHLORIDE 0.9 % IV BOLUS (SEPSIS)
2000.0000 mL | Freq: Once | INTRAVENOUS | Status: AC
  Administered 2016-09-10: 2000 mL via INTRAVENOUS

## 2016-09-10 NOTE — Progress Notes (Signed)
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 In ED, chest x-ray was negative but he shouldn't was actively wheezing and an anion gap metabolic acidosis. Recent diarrhea, resolved for 4 days. Admitted for acute respiratory failure secondary to asthma exacerbation. Improved.   Assessment & Plan:   Principal Problem:   Acute respiratory failure with hypoxia ([facility]) Active Problems:   HTN (hypertension)   HIV disease ([facility])   Exacerbation of asthma   1. Acute asthma exacerbation: Treating with oxygen, bronchodilator nebulizations, IV Solu-Medrol and levofloxacin. Chest x-ray without acute disease. CTA chest: No PE or active pulmonary disease. Low index of suspicion for PCP. Improved. Influenza panel PCR negative.  2. Acute respiratory failure with hypoxia: Secondary to problem #1. Treatment as above. Wean off of oxygen as tolerated.  3. Non-anion gap metabolic acidosis/lactic acidosis: Unclear etiology. Recent diarrhea but has resolved 4-5 days ago without recurrence.? RTA. Bicarbonate improved/stable. Lactate normalized. Elevated lactate may have been related to acute respiratory failure. Repeat ABG shows improvement: PH 7.38, PCO2 28, PO2 125, bicarbonate 15.8 and oxygen saturation 98.4.  4. HIV: Follows with Dr. Campbell/infectious disease. HIV 1 RNA <20 and CD4: 690 on 07/01/16. Continue ART.  5. Essential hypertension: Mildly uncontrolled. Continue amlodipine. Monitor closely.  6. History of bladder and prostatic urethral cancer: Status post TURBT and chemotherapy-supposed to get again later this month.  7. History of latent tuberculosis: As per ID, plans to treat this after  treatment for bladder cancer.  8. Chronic anemia: Stable  9. Hyponatremia: Stable? SIADH.   DVT prophylaxis: Lovenox  Code Status: Full  Family Communication: None at bedside  Disposition Plan: DC home when medically stable, possibly in 1-2 days.  Consultants:   None  Procedures:   None   Antimicrobials:   IV Levofloxacin    Subjective: Feels better. Dyspnea improved. Cough improved and states that the sputum has changed color from yellow to white. Denies any other complaints.  Objective:  Vitals:   09/10/16 0653 09/10/16 0746 09/10/16 1137 09/10/16 1427  BP: (!) 155/113   (!) 143/95  Pulse: 82   89  Resp: 20   20  Temp: 97.9 F (36.6 C)   98 F (36.7 C)  TempSrc: Oral   Oral  SpO2: 100% 98% 98% 100%  Weight:      Height:        Intake/Output Summary (Last 24 hours) at 09/10/16 1526 Last data filed at 09/10/16 1430  Gross per 24 hour  Intake             4810 ml  Output             2475 ml  Net             2335 ml   Filed Weights   09/10/16 0127  Weight: 56.8 kg (125 lb 4.8 oz)    Examination:  General exam: Pleasant middle-aged male sitting up comfortably in bed.  Respiratory system: Clear to auscultation except occasional rhonchi posteriorly. Respiratory effort normal. Cardiovascular system: S1 & S2 heard, RRR. No JVD, murmurs, rubs, gallops or clicks. No pedal edema.Telemetry: Sinus rhythm.  Gastrointestinal system: Abdomen is nondistended, soft and nontender. No organomegaly or masses felt. Normal bowel  sounds heard. Central nervous system: Alert and oriented. No focal neurological deficits. Extremities: Symmetric 5 x 5 power. Skin: No rashes, lesions or ulcers Psychiatry: Judgement and insight appear normal. Mood & affect appropriate.     Data Reviewed: I have personally reviewed following labs and imaging studies  CBC:  Recent Labs Lab 09/09/16 2109 09/10/16 0140 09/10/16 0608  WBC 4.6 4.5 4.2  NEUTROABS 3.6  --   --   HGB 11.2*  10.9* 10.3*  HCT 32.7* 31.8* 30.8*  MCV 100.0 100.3* 98.7  PLT 198 230 [id number]   Basic Metabolic Panel:  Recent Labs Lab 09/09/16 2109 09/10/16 0140 09/10/16 0608  NA 130* 130* 133*  K 3.3* 3.6 3.7  CL 103 105 105  CO2 16* 14* 17*  GLUCOSE 137* 199* 162*  BUN 5* <5* <5*  CREATININE 0.93 1.09 0.75  CALCIUM 8.2* 8.2* 8.2*   GFR: Estimated Creatinine Clearance: 76.9 mL/min (by C-G formula based on SCr of 0.75 mg/dL). Liver Function Tests: No results for input(s): AST, ALT, ALKPHOS, BILITOT, PROT, ALBUMIN in the last 168 hours. No results for input(s): LIPASE, AMYLASE in the last 168 hours. No results for input(s): AMMONIA in the last 168 hours. Coagulation Profile: No results for input(s): INR, PROTIME in the last 168 hours. Cardiac Enzymes:  Recent Labs Lab 09/10/16 0140 09/10/16 0608 09/10/16 1004  TROPONINI <0.03 <0.03 <0.03   BNP (last 3 results) No results for input(s): PROBNP in the last 8760 hours. HbA1C: No results for input(s): HGBA1C in the last 72 hours. CBG: No results for input(s): GLUCAP in the last 168 hours. Lipid Profile: No results for input(s): CHOL, HDL, LDLCALC, TRIG, CHOLHDL, LDLDIRECT in the last 72 hours. Thyroid Function Tests:  Recent Labs  09/10/16 0140  TSH 0.944   Anemia Panel: No results for input(s): VITAMINB12, FOLATE, FERRITIN, TIBC, IRON, RETICCTPCT in the last 72 hours.  Sepsis Labs:  Recent Labs Lab 09/09/16 2109 09/10/16 0140 09/10/16 0608 09/10/16 1004  PROCALCITON  --   --  <0.10  --   WBC 4.6 4.5 4.2  --   LATICACIDVEN 2.5* 5.9* 2.4* 1.8    No results found for this or any previous visit (from the past 240 hour(s)).       Radiology Studies: Ct Angio Chest Pe W Or Wo Contrast  Result Date: 09/10/2016 CLINICAL DATA:  Shortness of breath for 4 days. Cough. History of asthma. EXAM: CT ANGIOGRAPHY CHEST WITH CONTRAST TECHNIQUE: Multidetector CT imaging of the chest was performed using the standard protocol during  bolus administration of intravenous contrast. Multiplanar CT image reconstructions and MIPs were obtained to evaluate the vascular anatomy. CONTRAST:  100 mL Isovue 370 COMPARISON:  None. FINDINGS: Cardiovascular: Satisfactory opacification of the pulmonary arteries to the segmental level. No evidence of pulmonary embolism. Normal heart size. No pericardial effusion. Coronary artery calcifications. Normal caliber aorta without aneurysm or dissection. Mediastinum/Nodes: No significant lymphadenopathy in the chest. Small esophageal hiatal hernia with mild distal esophageal wall thickening suggesting probable reflux changes. No esophageal dilatation. Thyroid gland is unremarkable. Lungs/Pleura: Lungs are clear. No pleural effusion or pneumothorax. Upper Abdomen: Subcentimeter cyst in the upper pole right kidney. Vascular calcifications. Musculoskeletal: Degenerative changes in the spine. No destructive bone lesions. Review of the MIP images confirms the above findings. IMPRESSION: No evidence of significant pulmonary embolus. No evidence of active pulmonary disease. Electronically Signed   By: Lucienne Capers M.D.   On: 09/10/2016 01:01   Dg Chest Port 1 View  Result Date:  09/10/2016 CLINICAL DATA:  Shortness of breath EXAM: PORTABLE CHEST 1 VIEW COMPARISON:  09/09/2016 FINDINGS: The heart size and mediastinal contours are within normal limits. Both lungs are clear. The visualized skeletal structures are unremarkable. IMPRESSION: No active disease. Electronically Signed   By: Lucienne Capers M.D.   On: 09/10/2016 06:21   Dg Chest Port 1 View  Result Date: 09/09/2016 CLINICAL DATA:  Concern for pneumonia. History of asthma, COPD, and HIV. EXAM: PORTABLE CHEST 1 VIEW COMPARISON:  10/31/2015 FINDINGS: The cardiomediastinal silhouette is within normal limits. The lungs remain hyperinflated without evidence of airspace consolidation, edema, pleural effusion, or pneumothorax. No acute osseous abnormality is seen.  IMPRESSION: No active disease. Electronically Signed   By: Logan Bores M.D.   On: 09/09/2016 20:48        Scheduled Meds: . amLODipine  5 mg Oral Daily  . budesonide (PULMICORT) nebulizer solution  0.25 mg Nebulization BID  . elvitegravir-cobicistat-emtricitabine-tenofovir  1 tablet Oral Q breakfast  . enoxaparin (LOVENOX) injection  40 mg Subcutaneous Daily  . levalbuterol  0.63 mg Nebulization Q4H  . levofloxacin (LEVAQUIN) IV  750 mg Intravenous QHS  . megestrol  400 mg Oral Daily  . methylPREDNISolone (SOLU-MEDROL) injection  40 mg Intravenous Q12H   Continuous Infusions: . sodium chloride 100 mL/hr at 09/10/16 0632     LOS: 1 day       [facility], MD [facility] Pager [phone]  If 7PM-7AM, please contact night-coverage [email or web address] Password [email or web address] 09/10/2016, 3:26 PM

## 2016-09-10 NOTE — ED Notes (Signed)
Patient transported to CT 

## 2016-09-11 ENCOUNTER — Inpatient Hospital Stay (HOSPITAL_COMMUNITY): Payer: Medicare Other

## 2016-09-11 DIAGNOSIS — R109 Unspecified abdominal pain: Secondary | ICD-10-CM

## 2016-09-11 LAB — CBC
HCT: 31.4 % — ABNORMAL LOW (ref 39.0–52.0)
Hemoglobin: 10.8 g/dL — ABNORMAL LOW (ref 13.0–17.0)
MCH: 33.6 pg (ref 26.0–34.0)
MCHC: 34.4 g/dL (ref 30.0–36.0)
MCV: 97.8 fL (ref 78.0–100.0)
PLATELETS: 237 10*3/uL (ref 150–400)
RBC: 3.21 MIL/uL — ABNORMAL LOW (ref 4.22–5.81)
RDW: 12.6 % (ref 11.5–15.5)
WBC: 5.7 10*3/uL (ref 4.0–10.5)

## 2016-09-11 LAB — BASIC METABOLIC PANEL
Anion gap: 11 (ref 5–15)
BUN: 5 mg/dL — AB (ref 6–20)
CALCIUM: 8.5 mg/dL — AB (ref 8.9–10.3)
CO2: 19 mmol/L — ABNORMAL LOW (ref 22–32)
CREATININE: 0.8 mg/dL (ref 0.61–1.24)
Chloride: 100 mmol/L — ABNORMAL LOW (ref 101–111)
GFR calc Af Amer: >60 mL/min (ref >60)
GLUCOSE: 130 mg/dL — AB (ref 65–99)
Potassium: 4.1 mmol/L (ref 3.5–5.1)
Sodium: 130 mmol/L — ABNORMAL LOW (ref 135–145)

## 2016-09-11 MED ORDER — BENZONATATE 100 MG PO CAPS
200.0000 mg | ORAL_CAPSULE | Freq: Three times a day (TID) | ORAL | Status: DC
  Administered 2016-09-11 – 2016-09-12 (×4): 200 mg via ORAL
  Filled 2016-09-11 (×4): qty 2

## 2016-09-11 NOTE — Progress Notes (Signed)
Pt has been weaned to RA and sats are 98%. So signs of distress and dyspnea.

## 2016-09-11 NOTE — Progress Notes (Signed)
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 In ED, chest x-ray was negative but he shouldn't was actively wheezing and an anion gap metabolic acidosis. Recent diarrhea, resolved for 4 days. Admitted for acute respiratory failure secondary to asthma exacerbation. Improved.   Assessment & Plan:   Principal Problem:   Acute respiratory failure with hypoxia (HCC) Active Problems:   HTN (hypertension)   HIV disease (HCC)   Exacerbation of asthma   1. Acute asthma exacerbation: Treating with oxygen, bronchodilator nebulizations, IV Solu-Medrol and levofloxacin. Chest x-ray without acute disease. CTA chest: No PE or active pulmonary disease. Low index of suspicion for PCP. Improved. Influenza panel PCR negative. Bothersome cough-Tessalon Perles. Likely having muscular chest wall and abdominal wall pain from coughing.  2. Acute respiratory failure with hypoxia: Secondary to problem #1. Treatment as above. Wean off of oxygen as tolerated.  3. Non-anion gap metabolic acidosis/lactic acidosis: Unclear etiology. Recent diarrhea but has resolved 4-5 days ago without recurrence.? RTA. Bicarbonate improved/stable. Lactate normalized. Elevated lactate may have been related to acute respiratory failure. Repeat ABG shows improvement: PH 7.38, PCO2 28, PO2 125, bicarbonate 15.8 and oxygen saturation 98.4.  4. HIV: Follows with Dr. Campbell/infectious disease. HIV 1 RNA <20 and CD4: 690 on 07/01/16. Continue ART.  5. Essential hypertension: Mildly uncontrolled. Continue amlodipine. Monitor closely.  6. History of bladder and prostatic urethral cancer: Status post TURBT and chemotherapy-supposed to  get again later this month.  7. History of latent tuberculosis: As per ID, plans to treat this after treatment for bladder cancer.  8. Chronic anemia: Stable  9. Hyponatremia: Stable? SIADH.  10. Lower abdominal pain: Likely muscular as indicated above. Control cough. Symptomatic treatment. KUB without acute findings.   DVT prophylaxis: Lovenox  Code Status: Full  Family Communication: None at bedside  Disposition Plan: DC home when medically stable, possibly in 1-2 days.  Consultants:   None  Procedures:   None   Antimicrobials:   IV Levofloxacin    Subjective: Bothersome cough. Complains of chest pain and lower abdominal pain on coughing. Dyspnea improved. Last BMD prior to admission. Passing flatus. No nausea or vomiting. Tolerating diet.  Objective:  Vitals:   09/11/16 0207 09/11/16 0629 09/11/16 0901 09/11/16 0902  BP:  (!) 168/61    Pulse:  77    Resp:  18    Temp:  97.8 F (36.6 C)    TempSrc:      SpO2: 98% 100% 99% 99%  Weight:      Height:        Intake/Output Summary (Last 24 hours) at 09/11/16 1305 Last data filed at 09/11/16 0700  Gross per 24 hour  Intake             1120 ml  Output             1850 ml  Net             -730 ml   Filed Weights   09/10/16 0127  Weight: 56.8 kg (125 lb 4.8 oz)    Examination:  General exam: Pleasant middle-aged male sitting up comfortably in bed. Having bouts of nonproductive cough. Respiratory system: Clear to auscultation except occasional rhonchi left anterior  lung fields. Respiratory effort normal. Cardiovascular system: S1 & S2 heard, RRR. No JVD, murmurs, rubs, gallops or clicks. No pedal edema.Telemetry: Sinus rhythm.  Gastrointestinal system: Abdomen is nondistended, soft and nontender. No organomegaly or masses felt. Normal bowel sounds heard. Central nervous system: Alert and oriented. No focal neurological deficits. Extremities: Symmetric 5 x 5 power. Skin: No rashes, lesions or  ulcers Psychiatry: Judgement and insight appear normal. Mood & affect appropriate.     Data Reviewed: I have personally reviewed following labs and imaging studies  CBC:  Recent Labs Lab 09/09/16 2109 09/10/16 0140 09/10/16 0608 09/11/16 0443  WBC 4.6 4.5 4.2 5.7  NEUTROABS 3.6  --   --   --   HGB 11.2* 10.9* 10.3* 10.8*  HCT 32.7* 31.8* 30.8* 31.4*  MCV 100.0 100.3* 98.7 97.8  PLT 198 230 226 [id number]   Basic Metabolic Panel:  Recent Labs Lab 09/09/16 2109 09/10/16 0140 09/10/16 0608 09/11/16 0443  NA 130* 130* 133* 130*  K 3.3* 3.6 3.7 4.1  CL 103 105 105 100*  CO2 16* 14* 17* 19*  GLUCOSE 137* 199* 162* 130*  BUN 5* <5* <5* 5*  CREATININE 0.93 1.09 0.75 0.80  CALCIUM 8.2* 8.2* 8.2* 8.5*   GFR: Estimated Creatinine Clearance: 76.9 mL/min (by C-G formula based on SCr of 0.8 mg/dL). Liver Function Tests: No results for input(s): AST, ALT, ALKPHOS, BILITOT, PROT, ALBUMIN in the last 168 hours. No results for input(s): LIPASE, AMYLASE in the last 168 hours. No results for input(s): AMMONIA in the last 168 hours. Coagulation Profile: No results for input(s): INR, PROTIME in the last 168 hours. Cardiac Enzymes:  Recent Labs Lab 09/10/16 0140 09/10/16 0608 09/10/16 1004  TROPONINI <0.03 <0.03 <0.03   BNP (last 3 results) No results for input(s): PROBNP in the last 8760 hours. HbA1C: No results for input(s): HGBA1C in the last 72 hours. CBG: No results for input(s): GLUCAP in the last 168 hours. Lipid Profile: No results for input(s): CHOL, HDL, LDLCALC, TRIG, CHOLHDL, LDLDIRECT in the last 72 hours. Thyroid Function Tests:  Recent Labs  09/10/16 0140  TSH 0.944   Anemia Panel: No results for input(s): VITAMINB12, FOLATE, FERRITIN, TIBC, IRON, RETICCTPCT in the last 72 hours.  Sepsis Labs:  Recent Labs Lab 09/09/16 2109 09/10/16 0140 09/10/16 0608 09/10/16 1004 09/11/16 0443  PROCALCITON  --   --  <0.10  --   --   WBC 4.6 4.5 4.2  --  5.7   LATICACIDVEN 2.5* 5.9* 2.4* 1.8  --     Recent Results (from the past 240 hour(s))  Culture, blood (routine x 2)     Status: None (Preliminary result)   Collection Time: 09/10/16  3:15 AM  Result Value Ref Range Status   Specimen Description BLOOD LEFT ANTECUBITAL  Final   Special Requests BOTTLES DRAWN AEROBIC AND ANAEROBIC 10CC EA  Final   Culture NO GROWTH 1 DAY  Final   Report Status PENDING  Incomplete  Culture, blood (routine x 2)     Status: None (Preliminary result)   Collection Time: 09/10/16  3:19 AM  Result Value Ref Range Status   Specimen Description BLOOD RIGHT FOREARM  Final   Special Requests BOTTLES DRAWN AEROBIC AND ANAEROBIC 10CC EA  Final   Culture NO GROWTH 1 DAY  Final   Report Status PENDING  Incomplete         Radiology Studies: Ct Angio Chest Pe W Or Wo Contrast  Result Date: 09/10/2016 CLINICAL DATA:  Shortness of breath for 4 days. Cough. History of asthma. EXAM: CT ANGIOGRAPHY CHEST WITH CONTRAST TECHNIQUE: Multidetector CT imaging of the chest was performed using the standard protocol during bolus administration of intravenous contrast. Multiplanar CT image reconstructions and MIPs were obtained to evaluate the vascular anatomy. CONTRAST:  100 mL Isovue 370 COMPARISON:  None. FINDINGS: Cardiovascular: Satisfactory opacification of the pulmonary arteries to the segmental level. No evidence of pulmonary embolism. Normal heart size. No pericardial effusion. Coronary artery calcifications. Normal caliber aorta without aneurysm or dissection. Mediastinum/Nodes: No significant lymphadenopathy in the chest. Small esophageal hiatal hernia with mild distal esophageal wall thickening suggesting probable reflux changes. No esophageal dilatation. Thyroid gland is unremarkable. Lungs/Pleura: Lungs are clear. No pleural effusion or pneumothorax. Upper Abdomen: Subcentimeter cyst in the upper pole right kidney. Vascular calcifications. Musculoskeletal: Degenerative changes  in the spine. No destructive bone lesions. Review of the MIP images confirms the above findings. IMPRESSION: No evidence of significant pulmonary embolus. No evidence of active pulmonary disease. Electronically Signed   By: Lucienne Capers M.D.   On: 09/10/2016 01:01   Dg Chest Port 1 View  Result Date: 09/10/2016 CLINICAL DATA:  Shortness of breath EXAM: PORTABLE CHEST 1 VIEW COMPARISON:  09/09/2016 FINDINGS: The heart size and mediastinal contours are within normal limits. Both lungs are clear. The visualized skeletal structures are unremarkable. IMPRESSION: No active disease. Electronically Signed   By: Lucienne Capers M.D.   On: 09/10/2016 06:21   Dg Chest Port 1 View  Result Date: 09/09/2016 CLINICAL DATA:  Concern for pneumonia. History of asthma, COPD, and HIV. EXAM: PORTABLE CHEST 1 VIEW COMPARISON:  10/31/2015 FINDINGS: The cardiomediastinal silhouette is within normal limits. The lungs remain hyperinflated without evidence of airspace consolidation, edema, pleural effusion, or pneumothorax. No acute osseous abnormality is seen. IMPRESSION: No active disease. Electronically Signed   By: Logan Bores M.D.   On: 09/09/2016 20:48   Dg Abd 2 Views  Result Date: 09/11/2016 CLINICAL DATA:  Cough, midline abdominal pain, smoking history EXAM: ABDOMEN - 2 VIEW COMPARISON:  None. FINDINGS: Supine and erect views of the abdomen show no bowel obstruction. No free air is seen on the erect view. No opaque calculi are seen. There are degenerative changes in the mid to lower lumbar spine. 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 IMPRESSION: 1. No bowel obstruction.  No free air. 2. Bilateral total hip replacements. Electronically Signed   By: Ivar Drape M.D.   On: 09/11/2016 11:53        Scheduled Meds: . amLODipine  5 mg Oral Daily  . benzonatate  200 mg Oral TID  . budesonide (PULMICORT) nebulizer solution  0.25 mg Nebulization BID  . elvitegravir-cobicistat-emtricitabine-tenofovir  1 tablet  Oral Q breakfast  . enoxaparin (LOVENOX) injection  40 mg Subcutaneous Daily  . levalbuterol  0.63 mg Nebulization Q6H  . levofloxacin (LEVAQUIN) IV  750 mg Intravenous QHS  . megestrol  400 mg Oral Daily  . methylPREDNISolone (SOLU-MEDROL) injection  40 mg Intravenous Q12H   Continuous Infusions:    LOS: 2 days       [employer], MD [facility] Pager [phone]  If 7PM-7AM, please contact night-coverage [email or web address] Password [email or web address] 09/11/2016, 1:05 PM
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
## 2016-09-12 LAB — PROCALCITONIN

## 2016-09-12 MED ORDER — PREDNISONE 10 MG PO TABS: ORAL_TABLET | ORAL | 0 refills | Status: DC

## 2016-09-12 MED ORDER — ALBUTEROL SULFATE HFA 108 (90 BASE) MCG/ACT IN AERS: 2.0000 | INHALATION_SPRAY | Freq: Four times a day (QID) | RESPIRATORY_TRACT | 0 refills | Status: DC | PRN

## 2016-09-12 MED ORDER — BENZONATATE 200 MG PO CAPS: 200.0000 mg | ORAL_CAPSULE | Freq: Three times a day (TID) | ORAL | 0 refills | Status: DC | PRN

## 2016-09-12 MED ORDER — LEVOFLOXACIN 500 MG PO TABS: 500.0000 mg | ORAL_TABLET | Freq: Every day | ORAL | 0 refills | Status: DC

## 2016-09-12 MED ORDER — PREDNISONE 20 MG PO TABS
40.0000 mg | ORAL_TABLET | Freq: Every day | ORAL | Status: DC
  Administered 2016-09-12: 40 mg via ORAL
  Filled 2016-09-12: qty 2

## 2016-09-12 MED ORDER — ALBUTEROL SULFATE (2.5 MG/3ML) 0.083% IN NEBU: 2.5000 mg | INHALATION_SOLUTION | Freq: Four times a day (QID) | RESPIRATORY_TRACT | 0 refills | Status: DC | PRN

## 2016-09-12 NOTE — Care Management Important Message (Signed)
Important Message  Patient Details  Name: [name] MRN: [id number] Date of Birth: [birth year]   Medicare Important Message Given:  Yes    [name] 09/12/2016, 2:07 PM

## 2016-09-12 NOTE — Care Management Note (Signed)
Case Management Note  Patient Details  Name: [name] MRN: [id number] Date of Birth: 1956/04/23  Subjective/Objective:                 Patient admitted with acute resp failure and hypoxia.    Action/Plan:  Will DC to home with DME nebulizer.   Expected Discharge Date:                  Expected Discharge Plan:  Home/Self Care  In-House Referral:     Discharge planning Services  CM Consult  Post Acute Care Choice:  Durable Medical Equipment Choice offered to:     DME Arranged:  Nebulizer machine DME Agency:  [place]:    [facility] Agency:     Status of Service:  Completed, signed off  If discussed at [place] of Stay Meetings, dates discussed:    Additional Comments:  Carles Collet, RN 09/12/2016, 10:19 AM
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
## 2016-09-15 ENCOUNTER — Ambulatory Visit: Payer: Self-pay | Admitting: *Deleted

## 2016-09-15 DIAGNOSIS — B20 Human immunodeficiency virus [HIV] disease: Secondary | ICD-10-CM

## 2016-09-15 LAB — CULTURE, BLOOD (ROUTINE X 2)
CULTURE: NO GROWTH
CULTURE: NO GROWTH

## 2016-09-16 ENCOUNTER — Telehealth: Payer: Self-pay | Admitting: *Deleted

## 2016-09-16 ENCOUNTER — Ambulatory Visit: Payer: Self-pay | Admitting: *Deleted

## 2016-09-16 VITALS — BP 128/86 | HR 88 | Temp 98.2°F

## 2016-09-16 DIAGNOSIS — Z789 Other specified health status: Secondary | ICD-10-CM

## 2016-09-16 DIAGNOSIS — B20 Human immunodeficiency virus [HIV] disease: Secondary | ICD-10-CM

## 2016-09-16 DIAGNOSIS — F329 Major depressive disorder, single episode, unspecified: Secondary | ICD-10-CM

## 2016-09-16 DIAGNOSIS — F32A Depression, unspecified: Secondary | ICD-10-CM

## 2016-09-16 NOTE — Patient Instructions (Signed)
Visit rescheduled for tomorrow.

## 2016-09-16 NOTE — Telephone Encounter (Signed)
RN following up on medications cost for patient. RN contacted [facility](Maria) she stated they do not have any prescriptions for the patient.  [facility](Karen) and she stated the [facility]  RN contacted [facility](Natalie) who stated the medication cost of  Tessalon  24.38 (not covered by insurance) Prednisone 0.49 Levaquin 1.25 Total cost of 26.12  RN contacted [facility] to see if we can pay for the copay cost and decrease the risk of a rehospitalization.  RN spoke with Starr [place] who feels we can cover the copay cost

## 2016-09-16 NOTE — Progress Notes (Signed)
RN contacted the patient to arrange a time for a visit today. Patient stated he is doing ok but is having trouble affording his medications. He stated he only had enough money to cover the 2 of his 4 prescriptions. Patient stated he could not afford the medication for his cough or the prednisone. RN agreed to see if we can get coverage for the cost of his medications, pick the medications up for him and have a home visit tomorrow.

## 2016-09-17 NOTE — Progress Notes (Signed)
RN returned after my meeting to transport [name] to the grocery store. [name] got 2 packages of meat that was less than 8 dollars combined. [name]'s SNAP benefits and bankcard did not have any funding at this time. RN assisted the patient at this time and transported him back home  RN traveled to [facility] and prepared the patient a bag of food that included some proteins and vegetables, then delivered it to [place]. [name] was very appreciative for the extra assistance with food.

## 2016-09-17 NOTE — Progress Notes (Addendum)
Yesterday RN spoke with the patient who stated he could not afford his medications that were ordered for him during his last hospitalization. Patient stated his brother could only afford to get his inhaler for him.  RN was able to find funding to cover his medications cost which is 26.12.   [facility] and picked up the patient's medications then delivered the medications to the patient's home  On physical assessment the [name] appears weak and tries with minimal exertion. Vitals are stable. [name] stated he is concerned that his lights will be disconnected and he does not have any means to pay the bill. RN contacted [employer] who stated [name]'s bill is current but due on 09/19/16. The representative stated [name]'s lights will not be disconnected before he receives his check on 03/03 but he can still call after the 17th and make a payment arrangement if he would like.   RN went through each new medication with [name]. [name] still appeared confused and RN could smell a faint amount of alcohol on [name]'s breath. RN asked [name] if I could feel a pillbox for him to be sure his medications are organized and correct since his regimen with prednisone can be tricky. [name]'s ability to read and write is very limited. He recognizes number and can write numbers but struggles to read.   [name] agreed but had trouble locating his old medications which suggest he has not been taking the Genova or BP med. RN addressed my concern with the length of time it took him to locate his Genvoya. [name] stated since he has been sick he has not been taking his medications. RN reminded [name] that his HIV will "show back up in his blood" if he does not take his medication. [name] became noticeably nervous and stated the hospital told him his numbers were good. RN reminded [name] that we have not checked his blood recently for HIV and without taking his medications every day his HIV will show again in his blood. [name]  stated he did not want that and would recommit to taking his medications. RN sat with [name] and explained the new pillbox system, including which box he is to take 1st and that he has 2 weeks of medications in his pill box. Together we went over how to take his medications, when he will be finish with the new meds, etc for a long period of time. RN asked [name] to take his medications while I was there but he declined stated he really needed to eat. [name] stated he does not have food in his home and needs to go to the grocery store. RN agreed to take [name] the the grocery store after he informed me of this plan to walk. The grocery store is about 1/2 mile or more.    During the last day of the previous certification the patient was hospitalized and unavailablen for recertification. Dr Campbell gave verbal order for re-evaluation to be completed on 09/16/16.     Recertification is needed at this time due to the unmet goal of medication adherence. Frequency / Duration of [facility] visits: Effective 09/16/16:  3mo1, 1wk8, 1mo1, 3PRN's for complications with disease process/progression, medication changes or concerns  [facility] will assess for learning needs related to diagnosis and treatment regimen, provide education as needed, fill pill box if needed, address any barriers which may be preventing medication compliance, and communicating with care team including physician and case manager. 8 Individualized Plan Of Care, Certification Period of   09/16/2016 to 12/15/2016 a. Type of service(s) and care to be delivered: Community Based Health Care Nurse   Frequency and duration of service: Effective: 09/16/16:  3mo1, 1wk8, 1mo1, 3PRN's for complications with disease process/progression, medication changes or concerns b. Activity restrictions: Pt may be up as tolerated and can safely ambulate without the need for a assistive device. Pt wears glasses c. Safety Measures: Standard Precautions/Infection Control d. Service  Objectives and Goals:Service Objectives are to assist the pt with HIV medication regimen adherence and staying in care with the Infectious Disease Clinic by identifying barriers to care. RN will address the barriers that are identified by the patient.   Patient continues to have unmet goals with adherence questionable. Pt identified a goal of getting over his Bladder Cancer and would like to be in a relationship with a male who is also positive. pt does not feel comfortable disclosing his status. Current RN goal for this patient is to offer a lot of emotional support and encouragement. RN would like to accompany the patient to his Clinic appts, with his permission, to ensure he has a understanding of information provided.  e. Equipment required: No additional equipment needs at this time f. Functional Limitations: Vision. Pt has corrective glasses that he wears g. Rehabilitation potential: Guarded h. Diet and Nutritional Needs: Regular Diet i. Medications and treatments: Medications have been reconciled and reviewed and are a part of EPIC electronic file j. Specific therapies if needed: RN k. Pertinent diagnoses: Hypertension, Asthma, Bladder Cancer, Depression l. Expected outcome: Guarded m.       

## 2016-09-19 ENCOUNTER — Ambulatory Visit: Payer: Self-pay | Admitting: *Deleted

## 2016-09-19 VITALS — BP 128/82 | HR 80 | Temp 96.3°F

## 2016-09-19 DIAGNOSIS — B20 Human immunodeficiency virus [HIV] disease: Secondary | ICD-10-CM

## 2016-09-22 ENCOUNTER — Ambulatory Visit: Payer: Medicare Other | Admitting: Family Medicine

## 2016-09-24 NOTE — Progress Notes (Signed)
During my last visit with Teyon, he visually looked sick and we covered a lot of things so I felt the need for another visit. On arrival Shammah appears stronger and states he feels a lot better. Esvin stated he has been taking his medications every day and wanted to know again if his HIV lab work is OK? RN reminded Kawon that it has been a while since we had done blood work but reassured him that his number will be fine if he continues to take his medications each day. RN reiterated to [place] that undetectable does not mean the virus is gone it does mean that the virus is weakened and only a little is present in his blood. Abrum verbalized understanding and wanted to go over his upcoming appts. RN asked Tajh to get his calendar that I wrote out all of his appt's so we can go over what day he is going where. Together we went over his upcoming appts. Sander asked if I could help him with getting into a senior apartment building through social services. Priscilla stated he really would like to leave in the senior housing [place] because it will be close to walk into town and bus stops are close by. He also want the light and water bill to be included in his rent cost. Demarius is concerned because his light bill has been elevated(more than he can afford) and he is concerned that it will jeopardize his housing. RN contacted [employer] and received information on how to apply for [place]  After leaving [employer] completed the [employer] housing application for [employer] and submitted the application as well.

## 2016-09-25 ENCOUNTER — Telehealth: Payer: Self-pay | Admitting: *Deleted

## 2016-09-25 NOTE — Telephone Encounter (Signed)
RN contacted [name] to confirm our visit for tomorrow. We scheduled our time for 10am. [name] stated he has been trying to get [name] services but have been denied. He asked if I could give the office a call to see what he needs to do next. RN contacted [phone] and spoke with one of the state medicaid representatives for [name] services. The gentleman stated that [name] did demonstrate a need for assistance but he must has 3 out of 5 IADL deficiencies to generate PCS hourse. [name] only had 2 out of 5. The representative stated that [name] could withdraw his appeal (since he missed the preagreed/arranged mediation which gives them grounds to dismiss the case) and then reapply through his Dr's office. RN informed the gentleman that I will have a conversation with [name] and we can give him a call back tomorrow with a decision

## 2016-09-26 ENCOUNTER — Ambulatory Visit: Payer: Self-pay | Admitting: *Deleted

## 2016-09-26 VITALS — BP 136/88

## 2016-09-26 DIAGNOSIS — B20 Human immunodeficiency virus [HIV] disease: Secondary | ICD-10-CM

## 2016-09-26 DIAGNOSIS — Z9114 Patient's other noncompliance with medication regimen: Secondary | ICD-10-CM

## 2016-09-26 DIAGNOSIS — R634 Abnormal weight loss: Secondary | ICD-10-CM

## 2016-09-26 NOTE — Progress Notes (Signed)
During today's visit we worked on getting [employer] adjusted so his lights will not be disconnected, we discussed he missed 5 days of his medication, we checked the housing authority application for senior housing, we set up a email account, set [place] [employer]gov so we can see his benefits online and have access for printing his award letter. And we gathered the needed documentation to apply for Low Income light bill assistance through the housing authority. Now I will go to [employer] and pick the application up for assitance with the light bill.  After leaving the home RN traveled to [employer] and gathered the application for [employer]

## 2016-09-29 ENCOUNTER — Ambulatory Visit: Payer: Self-pay | Admitting: *Deleted

## 2016-09-29 DIAGNOSIS — Z550 Illiteracy and low-level literacy: Secondary | ICD-10-CM

## 2016-09-29 DIAGNOSIS — B20 Human immunodeficiency virus [HIV] disease: Secondary | ICD-10-CM

## 2016-09-30 ENCOUNTER — Ambulatory Visit (INDEPENDENT_AMBULATORY_CARE_PROVIDER_SITE_OTHER): Payer: Medicare Other | Admitting: Internal Medicine

## 2016-09-30 ENCOUNTER — Encounter: Payer: Self-pay | Admitting: Internal Medicine

## 2016-09-30 DIAGNOSIS — I10 Essential (primary) hypertension: Secondary | ICD-10-CM | POA: Diagnosis not present

## 2016-09-30 DIAGNOSIS — Z227 Latent tuberculosis: Secondary | ICD-10-CM

## 2016-09-30 DIAGNOSIS — R7611 Nonspecific reaction to tuberculin skin test without active tuberculosis: Secondary | ICD-10-CM | POA: Diagnosis not present

## 2016-09-30 DIAGNOSIS — B20 Human immunodeficiency virus [HIV] disease: Secondary | ICD-10-CM

## 2016-09-30 DIAGNOSIS — R0602 Shortness of breath: Secondary | ICD-10-CM | POA: Diagnosis not present

## 2016-09-30 DIAGNOSIS — F321 Major depressive disorder, single episode, moderate: Secondary | ICD-10-CM | POA: Diagnosis not present

## 2016-09-30 LAB — CBC
HEMATOCRIT: 36.1 % — AB (ref 38.5–50.0)
HEMOGLOBIN: 12 g/dL — AB (ref 13.2–17.1)
MCH: 33.9 pg — ABNORMAL HIGH (ref 27.0–33.0)
MCHC: 33.2 g/dL (ref 32.0–36.0)
MCV: 102 fL — AB (ref 80.0–100.0)
MPV: 8.7 fL (ref 7.5–12.5)
Platelets: 309 10*3/uL (ref 140–400)
RBC: 3.54 MIL/uL — AB (ref 4.20–5.80)
RDW: 13.1 % (ref 11.0–15.0)
WBC: 4.1 10*3/uL (ref 3.8–10.8)

## 2016-09-30 LAB — COMPREHENSIVE METABOLIC PANEL
ALK PHOS: 70 U/L (ref 40–115)
ALT: 16 U/L (ref 9–46)
AST: 26 U/L (ref 10–35)
Albumin: 3.5 g/dL — ABNORMAL LOW (ref 3.6–5.1)
BILIRUBIN TOTAL: 0.3 mg/dL (ref 0.2–1.2)
BUN: 9 mg/dL (ref 7–25)
CALCIUM: 9 mg/dL (ref 8.6–10.3)
CO2: 22 mmol/L (ref 20–31)
Chloride: 108 mmol/L (ref 98–110)
Creat: 0.88 mg/dL (ref 0.70–1.25)
Glucose, Bld: 100 mg/dL — ABNORMAL HIGH (ref 65–99)
POTASSIUM: 3.9 mmol/L (ref 3.5–5.3)
Sodium: 137 mmol/L (ref 135–146)
TOTAL PROTEIN: 7.6 g/dL (ref 6.1–8.1)

## 2016-09-30 MED ORDER — DOLUTEGRAVIR SODIUM 50 MG PO TABS: 50.0000 mg | ORAL_TABLET | Freq: Every day | ORAL | 11 refills | Status: DC

## 2016-09-30 MED ORDER — ISONIAZID 300 MG PO TABS: 900.0000 mg | ORAL_TABLET | ORAL | 2 refills | Status: DC

## 2016-09-30 MED ORDER — PYRIDOXINE HCL 50 MG PO TABS: 50.0000 mg | ORAL_TABLET | Freq: Every day | ORAL | 2 refills | Status: DC

## 2016-09-30 MED ORDER — AMLODIPINE BESYLATE 5 MG PO TABS: ORAL_TABLET | ORAL | 5 refills | Status: DC

## 2016-09-30 MED ORDER — RIFAPENTINE 150 MG PO TABS: 900.0000 mg | ORAL_TABLET | ORAL | 2 refills | Status: DC

## 2016-09-30 MED ORDER — ABACAVIR-DOLUTEGRAVIR-LAMIVUD 600-50-300 MG PO TABS: 1.0000 | ORAL_TABLET | Freq: Every morning | ORAL | 11 refills | Status: DC

## 2016-09-30 MED FILL — TRIUMEQ 600-50-300 MG TABS: 600-50-300 | 30 days supply | Qty: 30 | Fill #0

## 2016-09-30 MED FILL — AMLODIPINE BESYLATE 5 MG TA: 5 | 30 days supply | Qty: 30 | Fill #0

## 2016-09-30 MED FILL — TIVICAY 50 MG TABLET: 50 | 30 days supply | Qty: 30 | Fill #0

## 2016-09-30 MED FILL — ISONIAZID 300 MG TABLET: 300 | 28 days supply | Qty: 12 | Fill #0

## 2016-09-30 MED FILL — PRIFTIN 150 MG TABLET: 150 | 28 days supply | Qty: 24 | Fill #0

## 2016-09-30 NOTE — Assessment & Plan Note (Signed)
His depression is in remission. 

## 2016-09-30 NOTE — Progress Notes (Signed)
Patient Active Problem List   Diagnosis Date Noted  . HIV disease ([place]) 04/18/2015    Priority: High  . Dysuria 05/29/2015    Priority: Medium  . Depression 04/19/2015    Priority: Medium  . 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 Malignant neoplasm of bladder ([place]) 03/30/2015    Priority: Medium  . Exacerbation of asthma 09/09/2016  . Acute respiratory failure with hypoxia ([place]) 09/09/2016  . Unintentional weight loss 07/01/2016  . Anorexia 03/11/2016  . Syncope 10/31/2015  . HTN (hypertension) 03/30/2015  . Asthma, chronic 03/30/2015    Patient's Medications  New Prescriptions   ABACAVIR-DOLUTEGRAVIR-LAMIVUDINE (TRIUMEQ) 600-50-300 MG TABLET    Take 1 tablet by mouth every morning.   DOLUTEGRAVIR (TIVICAY) 50 MG TABLET    Take 1 tablet (50 mg total) by mouth at bedtime.   ISONIAZID (NYDRAZID) 300 MG TABLET    Take 3 tablets (900 mg total) by mouth once a week. On Friday.   PYRIDOXINE (B-6) 50 MG TABLET    Take 1 tablet (50 mg total) by mouth daily.   RIFAPENTINE 150 MG TABS    Take 6 tablets (900 mg total) by mouth once a week. On Friday.  Previous Medications   ALBUTEROL (PROVENTIL) (2.5 MG/3ML) 0.083% NEBULIZER SOLUTION    Take 3 mLs (2.5 mg total) by nebulization every 6 (six) hours as needed for wheezing or shortness of breath.   ALBUTEROL (VENTOLIN HFA) 108 (90 BASE) MCG/ACT INHALER    Inhale 2 puffs into the lungs every 6 (six) hours as needed for wheezing or shortness of breath.   BENZONATATE (TESSALON) 200 MG CAPSULE    Take 1 capsule (200 mg total) by mouth 3 (three) times daily as needed for cough.   MEGESTROL (MEGACE) 400 MG/10ML SUSPENSION    Take 10 mLs (400 mg total) by mouth daily.   OXYCODONE-ACETAMINOPHEN (PERCOCET/ROXICET) 5-325 MG TABLET    Take 1-2 tablets by mouth every 6 (six) hours as needed for severe pain.  Modified Medications   Modified Medication Previous Medication   AMLODIPINE (NORVASC) 5 MG TABLET  amLODipine (NORVASC) 5 MG tablet      TAKE 1 TABLET (5 MG TOTAL) BY MOUTH DAILY.    TAKE 1 TABLET (5 MG TOTAL) BY MOUTH DAILY.  Discontinued Medications   ELVITEGRAVIR-COBICISTAT-EMTRICITABINE-TENOFOVIR (GENVOYA) 150-150-200-10 MG TABS TABLET    Take 1 tablet by mouth daily with breakfast.   LEVOFLOXACIN (LEVAQUIN) 500 MG TABLET    Take 1 tablet (500 mg total) by mouth daily.   PREDNISONE (DELTASONE) 10 MG TABLET    4 tablets daily for 3 days, then 3 tablets daily for 3 days, then 2 tablets daily for 3 days, then 1 tablet daily for 3 days, then stop.    Subjective: Kelan is in for his routine HIV follow-up visit. He was admitted to the hospital last month with a flare of his asthma. He tells me that before his hospitalization he was having problems with nausea and vomiting and missed several days of his Genvoya. He states that he is feeling well now. He has completed his prednisone taper and course of levofloxacin. He completed his last round of BCG bladder instillations last in November. His urologist, Dr. Louis Meckel, indicated that he would not restart BCG for at least 6 months. Quency states that his mood has been very good recently. He denies any anxiety or depression.   Review of Systems: Review  of Systems  Constitutional: Negative for chills, diaphoresis, fever, malaise/fatigue and weight loss.  HENT: Negative for sore throat.   Respiratory: Negative for cough, sputum production and shortness of breath.   Cardiovascular: Negative for chest pain.  Gastrointestinal: Negative for abdominal pain, diarrhea, heartburn, nausea and vomiting.  Genitourinary: Negative for dysuria and frequency.  Musculoskeletal: Negative for joint pain and myalgias.  Skin: Negative for rash.  Neurological: Negative for dizziness and headaches.  Psychiatric/Behavioral: Negative for depression and substance abuse. The patient is not nervous/anxious.     Past Medical History:  Diagnosis Date  . Asthma with COPD ([place])    . Bladder cancer (H[facility])    and prostatic urethra cancer  HX TURBT'S IN [place]  WITH INSTILLATION CHEMO TX'S  . Bladder tumor   . History of transfusion   . HIV disease ([place])    under care of [facility]  . Hypertension   . Nocturia     Social History  Substance Use Topics  . Smoking status: Current Every Day Smoker    Packs/day: 0.25    Years: 17.00    Types: Cigarettes  . Smokeless tobacco: Never Used     Comment: trying to quit  . Alcohol use 3.6 oz/week    6 Cans of beer per week     Comment: occasional    Family History  Problem Relation Age of Onset  . Cancer Mother     Gastric Cancer  . Hypertension Mother   . Heart disease Mother   . Cancer Maternal Grandmother     breast cancer     Allergies  Allergen Reactions  . Aspirin Shortness Of Breath, Nausea And Vomiting and Swelling    Objective:  Vitals:   09/30/16 1018  BP: (!) 170/77  Pulse: 67  Temp: 97.6 F (36.4 C)  TempSrc: Oral  Weight: 129 lb 12 oz (58.9 kg)   Body mass index is 22.27 kg/m.  Physical Exam  Constitutional: He is oriented to person, place, and time.  He is in good spirits. His weight is up 9 pounds over the past month  HENT:  Mouth/Throat: No oropharyngeal exudate.  Eyes: Conjunctivae are normal.  Cardiovascular: Normal rate and regular rhythm.   No murmur heard. Pulmonary/Chest: Effort normal and breath sounds normal. He has no wheezes. He has no rales.  Abdominal: Soft. He exhibits no mass. There is no tenderness.  Musculoskeletal: Normal range of motion.  Neurological: He is alert and oriented to person, place, and time.  Skin: No rash noted.  Psychiatric: Mood and affect normal.    Lab Results Lab Results  Component Value Date   WBC 5.7 09/11/2016   HGB 10.8 (L) 09/11/2016   HCT 31.4 (L) 09/11/2016   MCV 97.8 09/11/2016   PLT 237 09/11/2016    Lab Results  Component Value Date   CREATININE 0.80 09/11/2016   BUN 5 (L)  09/11/2016   NA 130 (L) 09/11/2016   K 4.1 09/11/2016   CL 100 (L) 09/11/2016   CO2 19 (L) 09/11/2016    Lab Results  Component Value Date   ALT 14 07/01/2016   AST 48 (H) 07/01/2016   ALKPHOS 111 07/01/2016   BILITOT 0.4 07/01/2016    Lab Results  Component Value Date   CHOL 129 10/17/2015   HDL 92 10/17/2015   LDLCALC 28 10/17/2015   TRIG 43 10/17/2015   CHOLHDL 1.4 10/17/2015   HIV 1 RNA Quant (copies/mL)  Date Value  07/01/2016 <20  03/11/2016 168 (H)  12/24/2015 246 (H)   CD4 T Cell Abs (/uL)  Date Value  07/01/2016 690  12/24/2015 910  10/17/2015 890     Problem List Items Addressed This Visit      High   HIV disease ([place])    His HIV infection is under excellent control. I will repeat lab work today. In order to allow him to start [place] and rifapentine for latent tuberculosis I will change Genvoya to Triumeq and Tivicay. He will be able to change back to [facility] after he completes INH and rifapentine in 12 weeks.      Relevant Medications   abacavir-dolutegravir-lamiVUDine (TRIUMEQ) 600-50-300 MG tablet   dolutegravir (TIVICAY) 50 MG tablet   isoniazid (NYDRAZID) 300 MG tablet   Rifapentine 150 MG TABS   Other Relevant Orders   T-helper cell (CD4)- ([name] clinic only)   HIV 1 RNA quant-no reflex-bld   CBC   Comprehensive metabolic panel     Medium   Depression    His depression is in remission.      Latent tuberculosis by blood test    I will start him on once weekly INH and rifapentine. This is the shortest recommended regimen for latent tuberculosis. Hopefully it will not interfere with his recent BCG therapy. He has spoken with our ID pharmacist, Cassie Kuppelweiser, about the change in his medications. He will be given a new medication chart and a monthly pillbox. We have also spoken with his community outreach nurse, [name], who will help oversee his medications.      Relevant Medications   isoniazid (NYDRAZID) 300 MG tablet   pyridOXINE  (B-6) 50 MG tablet   Rifapentine 150 MG TABS     Unprioritized   HTN (hypertension)   Relevant Medications   amLODipine (NORVASC) 5 MG tablet    Other Visit Diagnoses    SOBOE (shortness of breath on exertion)            Michel Bickers, MD [facility] for Infectious [place] [phone] pager   [phone] cell 09/30/2016, 12:02 PM

## 2016-09-30 NOTE — Progress Notes (Signed)
HPI: [name] is a 61 y.o. male who presents to the [name] clinic today for HIV follow-up with Dr. Megan Salon.    Allergies: Allergies  Allergen Reactions  . Aspirin Shortness Of Breath, Nausea And Vomiting and Swelling    Past Medical History: Past Medical History:  Diagnosis Date  . Asthma with COPD ([place])   . Bladder cancer (HCC)    and prostatic urethra cancer  HX TURBT'S IN [place], [place]  WITH INSTILLATION CHEMO TX'S  . Bladder tumor   . History of transfusion   . HIV disease ([place])    under care of [facility]  . Hypertension   . Nocturia     Social History: Social History   Social History  . Marital status: Legally Separated    Spouse name: N/A  . Number of children: N/A  . Years of education: N/A   Social History Main Topics  . Smoking status: Current Every Day Smoker    Packs/day: 0.25    Years: 17.00    Types: Cigarettes  . Smokeless tobacco: Never Used     Comment: trying to quit  . Alcohol use 3.6 oz/week    6 Cans of beer per week     Comment: occasional  . Drug use: No  . Sexual activity: Yes    Partners: Female    Birth control/ protection: Condom     Comment: given condoms   Other Topics Concern  . None   Social History Narrative  . None    Current Regimen: Genvoya  Labs: HIV 1 RNA Quant (copies/mL)  Date Value  07/01/2016 <20  03/11/2016 168 (H)  12/24/2015 246 (H)   CD4 T Cell Abs (/uL)  Date Value  07/01/2016 690  12/24/2015 910  10/17/2015 890   Hep B S Ab (no units)  Date Value  03/20/2015 NEG   Hepatitis B Surface Ag (no units)  Date Value  03/20/2015 NEGATIVE   HCV Ab (no units)  Date Value  03/20/2015 NEGATIVE    CrCl: Estimated Creatinine Clearance: 79.8 mL/min (by C-G formula based on SCr of 0.8 mg/dL).  Lipids:    Component Value Date/Time   CHOL 129 10/17/2015 1058   TRIG 43 10/17/2015 1058   HDL 92 10/17/2015 1058   CHOLHDL 1.4 10/17/2015 1058   VLDL 9 10/17/2015 1058   LDLCALC 28 10/17/2015 1058    Assessment: [name] is here today to follow-up with Dr. Megan Salon. He is taking Genvoya for his HIV infection.  He just finished his BCG bladder installations last November, and Dr. Megan Salon is now wanting to treat his latent TB. We will have to switch his HIV medications to not interact with his latent TB medications.  He will not restart his bladder CA treatment for another 6 months, so Dr. Megan Salon and the patient has elected to do INH + Rifapentine weekly x 12 weeks.  We will change him to Triumeq in the AM and Tivicay in the PM - Tivicay must be BID while on Rifapentine.  He will be able to switch back to [facility] after his latent TB therapy is completed. I will send all of his Rx to [facility] and fill his pill box for him monthly to help with compliance.  Plans: - Stop Genvoya - Start Triumeq PO once daily in the AM - Start Tivicay 50 mg PO once daily in the PM - Start INH 900 mg PO once weekly on Fridays - Start Rifapentine 900 mg PO once  weekly on Fridays - Start Vitamin B6 50 mg PO once daily - Send Rx to [place] pill box and f/u with pharmacist 3/5 at [place]. Joal Eakle, PharmD, [place] for Infectious Disease 09/30/2016, 11:25 AM

## 2016-09-30 NOTE — Assessment & Plan Note (Signed)
I will start him on once weekly INH and rifapentine. This is the shortest recommended regimen for latent tuberculosis. Hopefully it will not interfere with his recent BCG therapy. He has spoken with our ID pharmacist, Cassie Kuppelweiser, about the change in his medications. He will be given a new medication chart and a monthly pillbox. We have also spoken with his community outreach nurse, [name], who will help oversee his medications.

## 2016-09-30 NOTE — Assessment & Plan Note (Signed)
His HIV infection is under excellent control. I will repeat lab work today. In order to allow him to start [place] and rifapentine for latent tuberculosis I will change Genvoya to Triumeq and Tivicay. He will be able to change back to [facility] after he completes INH and rifapentine in 12 weeks.

## 2016-10-02 LAB — T-HELPER CELL (CD4) - (RCID CLINIC ONLY)
CD4 T CELL ABS: 740 /uL (ref 400–2700)
CD4 T CELL HELPER: 44 % (ref 33–55)

## 2016-10-02 LAB — HIV-1 RNA QUANT-NO REFLEX-BLD
HIV 1 RNA Quant: <20 copies/mL — AB
HIV-1 RNA Quant, Log: <1.3 Log copies/mL — AB

## 2016-10-03 ENCOUNTER — Ambulatory Visit: Payer: Self-pay | Admitting: *Deleted

## 2016-10-03 DIAGNOSIS — Z550 Illiteracy and low-level literacy: Secondary | ICD-10-CM

## 2016-10-06 ENCOUNTER — Ambulatory Visit (INDEPENDENT_AMBULATORY_CARE_PROVIDER_SITE_OTHER): Payer: Medicare Other | Admitting: Pharmacist

## 2016-10-06 ENCOUNTER — Ambulatory Visit: Payer: Self-pay | Admitting: *Deleted

## 2016-10-06 DIAGNOSIS — Z550 Illiteracy and low-level literacy: Secondary | ICD-10-CM

## 2016-10-06 DIAGNOSIS — B20 Human immunodeficiency virus [HIV] disease: Secondary | ICD-10-CM | POA: Diagnosis not present

## 2016-10-06 DIAGNOSIS — Z227 Latent tuberculosis: Secondary | ICD-10-CM

## 2016-10-06 DIAGNOSIS — R7611 Nonspecific reaction to tuberculin skin test without active tuberculosis: Secondary | ICD-10-CM

## 2016-10-06 NOTE — Progress Notes (Signed)
HPI: [name] is a 61 y.o. male who presents to the [facility] to pick up his pill box that I am filling for him and to discuss medications.   Allergies: Allergies  Allergen Reactions  . Aspirin Shortness Of Breath, Nausea And Vomiting and Swelling    Past Medical History: Past Medical History:  Diagnosis Date  . Asthma with COPD ([place])   . Bladder cancer ([facility])    and prostatic urethra cancer  HX TURBT'S IN [place], [place]  WITH INSTILLATION CHEMO TX'S  . Bladder tumor   . History of transfusion   . HIV disease ([place])    under care of [facility]  . Hypertension   . Nocturia     Social History: Social History   Social History  . Marital status: Legally Separated    Spouse name: N/A  . Number of children: N/A  . Years of education: N/A   Social History Main Topics  . Smoking status: Current Every Day Smoker    Packs/day: 0.25    Years: 17.00    Types: Cigarettes  . Smokeless tobacco: Never Used     Comment: trying to quit  . Alcohol use 3.6 oz/week    6 Cans of beer per week     Comment: occasional  . Drug use: No  . Sexual activity: Yes    Partners: Female    Birth control/ protection: Condom     Comment: given condoms   Other Topics Concern  . Not on file   Social History Narrative  . No narrative on file    Current Regimen: Genvoya -- switching to Triumeq in AM + Tivicay in PM  Labs: HIV 1 RNA Quant (copies/mL)  Date Value  09/30/2016 <20 DETECTED (A)  07/01/2016 <20  03/11/2016 168 (H)   CD4 T Cell Abs (/uL)  Date Value  09/30/2016 740  07/01/2016 690  12/24/2015 910   Hep B S Ab (no units)  Date Value  03/20/2015 NEG   Hepatitis B Surface Ag (no units)  Date Value  03/20/2015 NEGATIVE   HCV Ab (no units)  Date Value  03/20/2015 NEGATIVE    CrCl: Estimated Creatinine Clearance: 71.6 mL/min (by C-G formula based on SCr of 0.88 mg/dL).  Lipids:    Component Value Date/Time   CHOL 129  10/17/2015 1058   TRIG 43 10/17/2015 1058   HDL 92 10/17/2015 1058   CHOLHDL 1.4 10/17/2015 1058   VLDL 9 10/17/2015 1058   LDLCALC 28 10/17/2015 1058    Assessment: [name] is here today to follow-up with me regarding filling his pill box for the month and discussing new medications.  He has a h/o bladder cancer and has been getting BCG bladder installations that ended last November.  He is to start treatment for LTBI now.  He is currently on Genvoya, but we have to switch his HIV medications to not interact with his LTBI medication. We will switch him to Triumeq in AM + Tivicay in PM -- Tivicay will be BID in this regimen which is appropriate. He will also be starting on INH + rifapentine weekly x 12 weeks.    He tells me today that he is going for a scope tomorrow to see if the cancer has spread.  And if it has, he will be restarting chemo asap. Unsure if it would be BCG installations again.  I will follow up with him tomorrow afternoon/wednesday morning to see how his appointment went.  Delories Heinz was in the appointment with me today when I met with [name].  I filled his pill box and [name] explained to him how to take it.  She wrote numbers on the pill box to show him how to take it.  I'm a little worried that he will not be taking his medications correctly, but Delories Heinz will be seeing him weekly to help out. He also drinks every day, which could also complicate things for his LTBI therapy as the medications for that affect the liver.  I will bring him back in 1 month to refill pill box and to check his LFTs.    Plans: - Stop Genvoya - Start Triumeq PO once daily in the AM - Start Tivicay 50 mg PO in the PM - Start Vit B6 50 mg PO once daily - Start INH 900 mg PO on Friday mornings x 12 weeks - Start Rifapentine 900 mg PO on Fridays mornings x 12 weeks - F/u with me again 3/29 at [facility]. Oday Ridings, PharmD, [facility] 10/06/2016, 11:40 AM

## 2016-10-07 DIAGNOSIS — C678 Malignant neoplasm of overlapping sites of bladder: Secondary | ICD-10-CM | POA: Diagnosis not present

## 2016-10-09 ENCOUNTER — Telehealth: Payer: Self-pay | Admitting: *Deleted

## 2016-10-09 NOTE — Telephone Encounter (Addendum)
RN received a call from Linard stating he missed a day of his medications and wanted to let us know that he will not be restarting his Chemotherapy. Ercel stated he has taken today's dose of medication since he is not on Chemo. RN reiterated to Jadd that his "Day 5" medicine is the only ones that should not be missed with Chemo therapy. Lary then remembered and stated he understands that he is to take his Day 5 medicine of Friday's. Kurtiss then stated he did not understand why Dr Louis Meckel told him that there is nothing more that can be done for his cancer. Joselito is not sure if that means he is cured, terminal or stable. RN informed Latham that I will contact Dr Herrick's office and request a copy of the last note for [name] to review and to help me understand what to explain to the patient.  RN contacted [facility]/medical records and left a message asking for a copy of the last visit note for [name] to review. RN also stated that we would like to confirm that Chemotherapy will not be part of the treatment plan since the patient is currently receiving treatment for Latent TB. RN left fax number of [phone] along with my work cell number

## 2016-10-10 ENCOUNTER — Ambulatory Visit: Payer: Self-pay | Admitting: *Deleted

## 2016-10-10 DIAGNOSIS — Z227 Latent tuberculosis: Secondary | ICD-10-CM

## 2016-10-10 DIAGNOSIS — B20 Human immunodeficiency virus [HIV] disease: Secondary | ICD-10-CM

## 2016-10-10 DIAGNOSIS — Z550 Illiteracy and low-level literacy: Secondary | ICD-10-CM

## 2016-10-13 ENCOUNTER — Ambulatory Visit: Payer: Medicare Other | Admitting: Family Medicine

## 2016-10-13 ENCOUNTER — Telehealth: Payer: Self-pay | Admitting: *Deleted

## 2016-10-13 NOTE — Telephone Encounter (Signed)
RN received a call from [name] stating due to the weather he needs to cancel his appt with [name]. He could not locate the number, RN provided him with the [facility] for [facility] so he can reschedule his appt  [name] stated he is tolerating his medications pretty well, he states the medication does make him feel a little sick on the stomach for a while after he takes it but he states he always put something on his stomach 1st.   [name] confirmed that he has plenty of food and is ready for the snowy weather.  Plan to see the patient in person on this friday

## 2016-10-14 NOTE — Telephone Encounter (Signed)
I approve of the plan of care as outlined below 

## 2016-10-14 NOTE — Telephone Encounter (Signed)
Yesterday RN spoke with the patient who stated he could not afford his medications that were ordered for him during his last hospitalization. Patient stated his brother could only afford to get his inhaler for him.  RN was able to find funding to cover his medications cost which is 26.61.   RN traveled to [facility] and picked up the patient's medications then delivered the medications to the patient's home  On physical assessment the Matej appears weak and tries with minimal exertion. Vitals are stable. Elma stated he is concerned that his lights will be disconnected and he does not have any means to pay the bill. RN contacted [employer] who stated [place]'s bill is current but due on 09/19/16. The representative stated Alonte's lights will not be disconnected before he receives his check on 03/03 but he can still call after the 17th and make a payment arrangement if he would like.   RN went through each new medication with Mccartney. Shyquan still appeared confused and RN could smell a faint amount of alcohol on [name]'s breath. RN asked Nesta if I could feel a pillbox for him to be sure his medications are organized and correct since his regimen with prednisone can be tricky. Leonte's ability to read and write is very limited. He recognizes number and can write numbers but struggles to read.   Kashtyn agreed but had trouble locating his old medications which suggest he has not been taking the [place] or BP med. RN addressed my concern with the length of time it took him to locate his Genvoya. Medhansh stated since he has been sick he has not been taking his medications. RN reminded Carzell that his HIV will "show back up in his blood" if he does not take his medication. Willett became noticeably nervous and stated the hospital told him his numbers were good. RN reminded Shiven that we have not checked his blood recently for HIV and without taking his medications every day his HIV will show again in his blood. Jarom  stated he did not want that and would recommit to taking his medications. RN sat with Thijs and explained the new pillbox system, including which box he is to take 1st and that he has 2 weeks of medications in his pill box. Together we went over how to take his medications, when he will be finish with the new meds, etc for a long period of time. RN asked Chau to take his medications while I was there but he declined stated he really needed to eat. Arvel stated he does not have food in his home and needs to go to the grocery store. RN agreed to take Earlie the the grocery store after he informed me of this plan to walk. The grocery store is about 1/2 mile or more.    During the last day of the previous certification the patient was hospitalized and unavailablen for recertification. [name] gave verbal order for re-evaluation to be completed on 09/16/16.     Recertification is needed at this time due to the unmet goal of medication adherence. Frequency / Duration of [facility] visits: Effective 09/16/16:  24mo1, 1wk8, 4mo1, 3PRN's for complications with disease process/progression, medication changes or concerns  [facility] will assess for learning needs related to diagnosis and treatment regimen, provide education as needed, fill pill box if needed, address any barriers which may be preventing medication compliance, and communicating with care team including physician and case manager. 8 Individualized Plan Of Care, Certification Period of  09/16/2016 to 12/15/2016 a. Type of service(s) and care to be delivered: [facility] Nurse   Frequency and duration of service: Effective: 09/16/16:  98mo1, 1wk8, 63mo1, 3PRN's for complications with disease process/progression, medication changes or concerns b. Activity restrictions: Pt may be up as tolerated and can safely ambulate without the need for a assistive device. Pt wears glasses c. Safety Measures: Standard Precautions/Infection Control d. Service  Objectives and Goals:Service Objectives are to assist the pt with HIV medication regimen adherence and staying in care with the Infectious Disease Clinic by identifying barriers to care. RN will address the barriers that are identified by the patient.   Patient continues to have unmet goals with adherence questionable. Pt identified a goal of getting over his Bladder Cancer and would like to be in a relationship with a male who is also positive. pt does not feel comfortable disclosing his status. Current RN goal for this patient is to offer a lot of emotional support and encouragement. RN would like to accompany the patient to his Clinic appts, with his permission, to ensure he has a understanding of information provided.  e. Equipment required: No additional equipment needs at this time f. Functional Limitations: Vision. Pt has corrective glasses that he wears g. Rehabilitation potential: Guarded h. Diet and Nutritional Needs: Regular Diet i. Medications and treatments: Medications have been reconciled and reviewed and are a part of EPIC electronic file j. Specific therapies if needed: RN k. Pertinent diagnoses: Hypertension, Asthma, Bladder Cancer, Depression l. Expected outcome: Guarded m.

## 2016-10-16 NOTE — Progress Notes (Signed)
Prior to visiting the patient today I have gathered the needed documentation to complete his LIEAP application. This program offers assistance to low income families with assistance on their light bill. RN completed the application and attached the needed documentation to support the need for assistance.   The focus of today's visit is to ensure [name] maintains his electricity to stay compliant with is Rental agreement for his apartment. [name] continues to struggle with medication adherence and jeopardizing his housing will add another barrier to his medication adherence.  [name] ability to read and write is limited so he is unable to complete the form. RN took the completed application to [employer] home for approval and his signature  RN then transported the application to [employer], while at the office the receptionist informed me that a part of the application was missing.  RN traveled back to [employer] home for his to sign another required/missing piece of the application  RN traveled back to [employer] and dropped the application back off with the receptionist and she stamped each part of the application as received.

## 2016-10-20 ENCOUNTER — Encounter: Payer: Self-pay | Admitting: Family Medicine

## 2016-10-20 ENCOUNTER — Ambulatory Visit (INDEPENDENT_AMBULATORY_CARE_PROVIDER_SITE_OTHER): Payer: Medicare Other | Admitting: Family Medicine

## 2016-10-20 ENCOUNTER — Other Ambulatory Visit: Payer: Self-pay

## 2016-10-20 ENCOUNTER — Telehealth: Payer: Self-pay | Admitting: Family Medicine

## 2016-10-20 VITALS — BP 142/76 | HR 79 | Temp 98.6°F | Resp 18 | Ht 64.0 in | Wt 128.6 lb

## 2016-10-20 DIAGNOSIS — J42 Unspecified chronic bronchitis: Secondary | ICD-10-CM | POA: Diagnosis not present

## 2016-10-20 DIAGNOSIS — I10 Essential (primary) hypertension: Secondary | ICD-10-CM

## 2016-10-20 MED ORDER — ALBUTEROL SULFATE (2.5 MG/3ML) 0.083% IN NEBU: 2.5000 mg | INHALATION_SOLUTION | Freq: Four times a day (QID) | RESPIRATORY_TRACT | 10 refills | Status: DC | PRN

## 2016-10-20 MED ORDER — AMLODIPINE BESYLATE 5 MG PO TABS: ORAL_TABLET | ORAL | 5 refills | Status: DC

## 2016-10-20 MED ORDER — CETIRIZINE HCL 10 MG PO TABS: 10.0000 mg | ORAL_TABLET | Freq: Every day | ORAL | 0 refills | Status: DC

## 2016-10-20 MED FILL — ALBUTEROL 0.083% INHAL SOLN: (2.5 MG/3ML | 7 days supply | Qty: 90 | Fill #0

## 2016-10-20 NOTE — Patient Instructions (Signed)
Increase your Amlodipine from 5 mg to 7.5 mg at bedtime for blood pressure.  Start taking Cetirizine 10 mg at bedtime to reduce effects of seasonal allergies and nasal congestion.   Allergic Rhinitis Allergic rhinitis is when the mucous membranes in the nose respond to allergens. Allergens are particles in the air that cause your body to have an allergic reaction. This causes you to release allergic antibodies. Through a chain of events, these eventually cause you to release histamine into the blood stream. Although meant to protect the body, it is this release of histamine that causes your discomfort, such as frequent sneezing, congestion, and an itchy, runny nose. What are the causes? Seasonal allergic rhinitis (hay fever) is caused by pollen allergens that may come from grasses, trees, and weeds. Year-round allergic rhinitis (perennial allergic rhinitis) is caused by allergens such as house dust mites, pet dander, and mold spores. What are the signs or symptoms?  Nasal stuffiness (congestion).  Itchy, runny nose with sneezing and tearing of the eyes. How is this diagnosed? Your health care provider can help you determine the allergen or allergens that trigger your symptoms. If you and your health care provider are unable to determine the allergen, skin or blood testing may be used. Your health care provider will diagnose your condition after taking your health history and performing a physical exam. Your health care provider may assess you for other related conditions, such as asthma, pink eye, or an ear infection. How is this treated? Allergic rhinitis does not have a cure, but it can be controlled by:  Medicines that block allergy symptoms. These may include allergy shots, nasal sprays, and oral antihistamines.  Avoiding the allergen. Hay fever may often be treated with antihistamines in pill or nasal spray forms. Antihistamines block the effects of histamine. There are over-the-counter  medicines that may help with nasal congestion and swelling around the eyes. Check with your health care provider before taking or giving this medicine. If avoiding the allergen or the medicine prescribed do not work, there are many new medicines your health care provider can prescribe. Stronger medicine may be used if initial measures are ineffective. Desensitizing injections can be used if medicine and avoidance does not work. Desensitization is when a patient is given ongoing shots until the body becomes less sensitive to the allergen. Make sure you follow up with your health care provider if problems continue. Follow these instructions at home: It is not possible to completely avoid allergens, but you can reduce your symptoms by taking steps to limit your exposure to them. It helps to know exactly what you are allergic to so that you can avoid your specific triggers. Contact a health care provider if:  You have a fever.  You develop a cough that does not stop easily (persistent).  You have shortness of breath.  You start wheezing.  Symptoms interfere with normal daily activities. This information is not intended to replace advice given to you by your health care provider. Make sure you discuss any questions you have with your health care provider. Document Released: 04/15/2001 Document Revised: 03/21/2016 Document Reviewed: 03/28/2013 Elsevier Interactive Patient Education  2017 [employer].  Hypertension Hypertension is another name for high blood pressure. High blood pressure forces your heart to work harder to pump blood. This can cause problems over time. There are two numbers in a blood pressure reading. There is a top number (systolic) over a bottom number (diastolic). It is best to have a blood pressure  below 120/80. Healthy choices can help lower your blood pressure. You may need medicine to help lower your blood pressure if:  Your blood pressure cannot be lowered with healthy  choices.  Your blood pressure is higher than 130/80. Follow these instructions at home: Eating and drinking   If directed, follow the DASH eating plan. This diet includes:  Filling half of your plate at each meal with fruits and vegetables.  Filling one quarter of your plate at each meal with whole grains. Whole grains include whole wheat pasta, brown rice, and whole grain bread.  Eating or drinking low-fat dairy products, such as skim milk or low-fat yogurt.  Filling one quarter of your plate at each meal with low-fat (lean) proteins. Low-fat proteins include fish, skinless chicken, eggs, beans, and tofu.  Avoiding fatty meat, cured and processed meat, or chicken with skin.  Avoiding premade or processed food.  Eat less than 1,500 mg of salt (sodium) a day.  Limit alcohol use to no more than 1 drink a day for nonpregnant women and 2 drinks a day for men. One drink equals 12 oz of beer, 5 oz of wine, or 1 oz of hard liquor. Lifestyle   Work with your doctor to stay at a healthy weight or to lose weight. Ask your doctor what the best weight is for you.  Get at least 30 minutes of exercise that causes your heart to beat faster (aerobic exercise) most days of the week. This may include walking, swimming, or biking.  Get at least 30 minutes of exercise that strengthens your muscles (resistance exercise) at least 3 days a week. This may include lifting weights or pilates.  Do not use any products that contain nicotine or tobacco. This includes cigarettes and e-cigarettes. If you need help quitting, ask your doctor.  Check your blood pressure at home as told by your doctor.  Keep all follow-up visits as told by your doctor. This is important. Medicines   Take over-the-counter and prescription medicines only as told by your doctor. Follow directions carefully.  Do not skip doses of blood pressure medicine. The medicine does not work as well if you skip doses. Skipping doses also  puts you at risk for problems.  Ask your doctor about side effects or reactions to medicines that you should watch for. Contact a doctor if:  You think you are having a reaction to the medicine you are taking.  You have headaches that keep coming back (recurring).  You feel dizzy.  You have swelling in your ankles.  You have trouble with your vision. Get help right away if:  You get a very bad headache.  You start to feel confused.  You feel weak or numb.  You feel faint.  You get very bad pain in your:  Chest.  Belly (abdomen).  You throw up (vomit) more than once.  You have trouble breathing. Summary  Hypertension is another name for high blood pressure.  Making healthy choices can help lower blood pressure. If your blood pressure cannot be controlled with healthy choices, you may need to take medicine. This information is not intended to replace advice given to you by your health care provider. Make sure you discuss any questions you have with your health care provider. Document Released: 01/07/2008 Document Revised: 06/18/2016 Document Reviewed: 06/18/2016 Elsevier Interactive Patient Education  2017 [employer].

## 2016-10-20 NOTE — Telephone Encounter (Signed)
refilled rx for albuterol and added diagnosis code so pharmacy can bill Medicare Part B for this rx. Thanks!

## 2016-10-20 NOTE — Progress Notes (Signed)
Patient ID: Juanita Devincent, male    DOB: [birth year], 61 y.o.   MRN: [id number]  PCP: Sharon Seller, NP  Chief Complaint  Patient presents with  . Follow-up    6 month on copd/asthma    Subjective:  HPI 61 year old male presents for evaluation asthma follow-up today. He presents newly to me today to establish care.  Current medical problems include: Hypertension, asthma, bladder cancer, HIV, dysuria, and depression.  Asthma and COPD He experienced an asthma exacerbation in February which resulted in a hospital stay x 3 days. Upon discharge, he was ordered a nebulizer machine which he reports significantly improved his shortness of breath.  He request a refills of albuterol solution for nebulizer machine. He reports an occasional cough which is nonproductive.  He continues to smoke, although reports that he is attempting to quit smoking and plans to purchase a e-cigarette.   He denies chest pain or shortness of breath.   Hypertension Continues Amlodipine 5 mg daily. Reports no routine monitoring of blood pressure at home. Unaware that blood pressure has been elevated during prior outpatient visits.  Social History   Social History  . Marital status: Legally Separated    Spouse name: N/A  . Number of children: N/A  . Years of education: N/A   Occupational History  . Not on file.   Social History Main Topics  . Smoking status: Current Every Day Smoker    Packs/day: 0.25    Years: 17.00    Types: Cigarettes  . Smokeless tobacco: Never Used     Comment: trying to quit  . Alcohol use 3.6 oz/week    6 Cans of beer per week     Comment: occasional  . Drug use: No  . Sexual activity: Yes    Partners: Female    Birth control/ protection: Condom     Comment: given condoms   Other Topics Concern  . Not on file   Social History Narrative  . No narrative on file    Family History  Problem Relation Age of Onset  . Cancer Mother     Gastric Cancer  . Hypertension  Mother   . Heart disease Mother   . Cancer Maternal Grandmother     breast cancer    Review of Systems  See HPI  Patient Active Problem List   Diagnosis Date Noted  . Exacerbation of asthma 09/09/2016  . Acute respiratory failure with hypoxia ([place]) 09/09/2016  . Unintentional weight loss 07/01/2016  . Anorexia 03/11/2016  . Syncope 10/31/2015  . Dysuria 05/29/2015  . Depression 04/19/2015  . HIV disease ([place]) 04/18/2015  . 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 HTN (hypertension) 03/30/2015  . Asthma, chronic 03/30/2015  . Malignant neoplasm of bladder ([place]) 03/30/2015    Allergies  Allergen Reactions  . Aspirin Shortness Of Breath, Nausea And Vomiting and Swelling    Prior to Admission medications   Medication Sig Start Date End Date Taking? Authorizing Provider  abacavir-dolutegravir-lamiVUDine (TRIUMEQ) 600-50-300 MG tablet Take 1 tablet by mouth every morning. 09/30/16  Yes Michel Bickers, MD  albuterol (PROVENTIL) (2.5 MG/3ML) 0.083% nebulizer solution Take 3 mLs (2.5 mg total) by nebulization every 6 (six) hours as needed for wheezing or shortness of breath. 09/12/16  Yes Modena Jansky, MD  albuterol (VENTOLIN HFA) 108 (90 Base) MCG/ACT inhaler Inhale 2 puffs into the lungs every 6 (six) hours as needed for wheezing or shortness of breath. 09/12/16  Yes Anand D Hongalgi,  MD  amLODipine (NORVASC) 5 MG tablet TAKE 1 TABLET (5 MG TOTAL) BY MOUTH DAILY. 09/30/16  Yes Michel Bickers, MD  benzonatate (TESSALON) 200 MG capsule Take 1 capsule (200 mg total) by mouth 3 (three) times daily as needed for cough. 09/12/16  Yes Modena Jansky, MD  dolutegravir (TIVICAY) 50 MG tablet Take 1 tablet (50 mg total) by mouth at bedtime. 09/30/16  Yes Michel Bickers, MD  isoniazid (NYDRAZID) 300 MG tablet Take 3 tablets (900 mg total) by mouth once a week. On Friday. 09/30/16  Yes Michel Bickers, MD  megestrol (MEGACE) 400 MG/10ML suspension Take 10 mLs (400 mg total) by mouth daily. Patient  taking differently: Take 400 mg by mouth 2 (two) times daily.  07/01/16  Yes Michel Bickers, MD  oxyCODONE-acetaminophen (PERCOCET/ROXICET) 5-325 MG tablet Take 1-2 tablets by mouth every 6 (six) hours as needed for severe pain. 07/05/15  Yes Ardis Hughs, MD  pyridOXINE (B-6) 50 MG tablet Take 1 tablet (50 mg total) by mouth daily. 09/30/16  Yes Michel Bickers, MD  Rifapentine 150 MG TABS Take 6 tablets (900 mg total) by mouth once a week. On Friday. 09/30/16  Yes Michel Bickers, MD    Past Medical, Surgical Family and Social History reviewed and updated.    Objective:   Today's Vitals   10/20/16 1308  BP: (!) 148/70  Pulse: 79  Resp: 18  Temp: 98.6 F (37 C)  TempSrc: Oral  SpO2: 100%  Weight: 128 lb 9.6 oz (58.3 kg)  Height: 5\' 4"  (1.626 m)    Wt Readings from Last 3 Encounters:  10/20/16 128 lb 9.6 oz (58.3 kg)  09/30/16 129 lb 12 oz (58.9 kg)  09/10/16 125 lb 4.8 oz (56.8 kg)   Physical Exam  Constitutional: He is oriented to person, place, and time. He appears well-developed and well-nourished.  HENT:  Head: Normocephalic and atraumatic.  Neck: Normal range of motion. Neck supple.  Cardiovascular: Normal rate, regular rhythm, normal heart sounds and intact distal pulses.   Pulmonary/Chest: Effort normal. He has decreased breath sounds in the right upper field, the right middle field, the right lower field, the left upper field, the left middle field and the left lower field.  Lymphadenopathy:    He has no cervical adenopathy.  Neurological: He is alert and oriented to person, place, and time.  Skin: Skin is warm and dry.  Psychiatric: He has a normal mood and affect. His behavior is normal. Judgment and thought content normal.     Assessment & Plan:  1. COPD, stable -Continue albuterol nebulizer treatments, 1-2 vials every 6 hours as needed. -Continue to use albuterol inhaler, 1-2 puffs , every 6 hours as needed for shortness of breath.  2. Essential hypertension,  stable, mildly uncontrolled. - Amlodipine (NORVASC) 5 MG tablet; TAKE 1.5  TABLET (7.5 MG TOTAL) BY MOUTH DAILY.  - Contemplating smoking cessation, actively reducing the number of cigarettes smokes per day.  Return in August for a complete physical exam.   Carroll Sage. Kenton Kingfisher, MSN, [facility] [address] [place], [place] [address] [phone]

## 2016-10-20 NOTE — Telephone Encounter (Signed)
erroneous

## 2016-10-28 ENCOUNTER — Ambulatory Visit: Payer: Self-pay | Admitting: *Deleted

## 2016-10-28 VITALS — BP 140/82

## 2016-10-28 DIAGNOSIS — Z227 Latent tuberculosis: Secondary | ICD-10-CM

## 2016-10-28 DIAGNOSIS — B20 Human immunodeficiency virus [HIV] disease: Secondary | ICD-10-CM

## 2016-10-28 MED FILL — TIVICAY 50 MG TABLET: 50 | 30 days supply | Qty: 30 | Fill #1

## 2016-10-28 MED FILL — TRIUMEQ 600-50-300 MG TABS: 600-50-300 | 30 days supply | Qty: 30 | Fill #1

## 2016-10-28 MED FILL — AMLODIPINE BESYLATE 5 MG TA: 5 | 30 days supply | Qty: 30 | Fill #1

## 2016-10-28 MED FILL — ISONIAZID 300 MG TABLET: 300 | 28 days supply | Qty: 12 | Fill #1

## 2016-10-28 MED FILL — PRIFTIN 150 MG TABLET: 150 | 28 days supply | Qty: 24 | Fill #1

## 2016-10-28 NOTE — Progress Notes (Signed)
RN made a home visit with the patient today. [name] appeared slighter thinner than before but he states he has not lost any weight. [name] stated he has in fact gained 4 to 5 pounds. RN reviewed the chart and noted that the patient has gained 5 pounds since 07/01/16 and has loss some ounces since then and is currently at 128 lbs. [name] states he continues to take his medications each day as ordered and denies missing any of his TB medication. [name] did state he missed a dose of his BP medication but later went back and took the medications. [name] hold his mail and during our visits we review the contents of his mail to ensure he understands what the mail if for. During today's visit we reviewed his light bill for upcoming due date and amount to be paid on the 13th. During our prior visit we completed a application for low income electricity assistance and it appears that the program has applied 200.00 dollars to the patient's light bill.   [name] stated he is still concerned about the results of his last appt with Dr [name] and is not sure of the status with his cancer. [name] stated Dr [name] looked inside his bladder and stated "something is still there but there is nothing more I can do for you. See you in 6 months" [name] stated he asked for a explanation but was told again "there is nothing more I can do, see you in 6 months"  RN contacted [facility] and they confirmed that they sent the notes to our office. RN to f/u on Urology visit notes so I can explain the results to [facility].  RN later confirmed that the patient has an appt on 3/29 to refill his pillbox with the [name] pharmacist. I will do that for the pharmacy so the patient will not have to worry about transportation cost. RN also reviewed the note from Dr Herrick/[facility] and it confirms that the patient does still have a spot in his bladder that is concerning and chemotherapy had little effect on the area. Dr [name] suggested leaving the  area alone for right now, allow [name] to have treatment for his Latent TB and then the plan is to re-evaluate the area by performing another cystoscopy in 6 months. RN contacted [name] and explained the above information. [name] verbalized a understanding of the plan with his cancer and we arranged a home visit on Thursday(10/30/16) to refill his pillbox

## 2016-10-30 ENCOUNTER — Ambulatory Visit: Payer: Medicare Other

## 2016-10-30 ENCOUNTER — Ambulatory Visit: Payer: Self-pay | Admitting: *Deleted

## 2016-10-30 VITALS — BP 132/86 | HR 72 | Temp 96.3°F

## 2016-10-30 DIAGNOSIS — B20 Human immunodeficiency virus [HIV] disease: Secondary | ICD-10-CM

## 2016-11-04 ENCOUNTER — Ambulatory Visit: Payer: Self-pay | Admitting: *Deleted

## 2016-11-04 DIAGNOSIS — B20 Human immunodeficiency virus [HIV] disease: Secondary | ICD-10-CM

## 2016-11-04 DIAGNOSIS — Z227 Latent tuberculosis: Secondary | ICD-10-CM

## 2016-11-07 ENCOUNTER — Ambulatory Visit: Payer: Self-pay | Admitting: *Deleted

## 2016-11-07 VITALS — BP 130/62 | Temp 96.7°F

## 2016-11-07 DIAGNOSIS — Z227 Latent tuberculosis: Secondary | ICD-10-CM

## 2016-11-07 DIAGNOSIS — B20 Human immunodeficiency virus [HIV] disease: Secondary | ICD-10-CM

## 2016-11-10 ENCOUNTER — Telehealth: Payer: Self-pay | Admitting: *Deleted

## 2016-11-10 NOTE — Telephone Encounter (Signed)
RN contacted [name] and reminded him of his upcoming appt with [name] tomorrow at 8:45. [name] verbalized understanding and thanked me for calling him with the reminder. We also confirmed our home visit for Friday

## 2016-11-11 ENCOUNTER — Ambulatory Visit: Payer: Medicare Other | Admitting: Internal Medicine

## 2016-11-11 NOTE — Telephone Encounter (Signed)
Patient no-showed today's appointment with [name].

## 2016-11-14 ENCOUNTER — Other Ambulatory Visit: Payer: Medicare Other | Admitting: *Deleted

## 2016-11-18 ENCOUNTER — Telehealth: Payer: Self-pay | Admitting: *Deleted

## 2016-11-18 ENCOUNTER — Ambulatory Visit: Payer: Self-pay | Admitting: *Deleted

## 2016-11-18 VITALS — BP 156/86 | HR 80 | Temp 96.4°F

## 2016-11-18 DIAGNOSIS — B20 Human immunodeficiency virus [HIV] disease: Secondary | ICD-10-CM

## 2016-11-18 DIAGNOSIS — Z550 Illiteracy and low-level literacy: Secondary | ICD-10-CM

## 2016-11-18 DIAGNOSIS — Z227 Latent tuberculosis: Secondary | ICD-10-CM

## 2016-11-18 NOTE — Progress Notes (Signed)
During today's visit RN confirmed with the patient that he is still on the waiting list with [facility]. He was placed on the waiting list on 09/19/16.  Patient stated he has missed a day or 2 of his daily medications but states he has taken all his TB medications on Friday and denies missing days of his TB medication. [name] states he has been feeling well but admits the TB meds make him feel a little nauseous but after a while he feels fine.  RN contacted [place] and a refill request can be placed on 11/21/16

## 2016-11-18 NOTE — Progress Notes (Signed)
RN made a home visit with the patient today. Prior to today's visit RN assisted the patient with making a payment arrangement to ensure he maintains electricity in his home and scheduled this visit to ensure payment is made on today. Rn assisted the patient with making his light bill payment over the phone today and confirmed next payment date.   Focus of today's visit is to ensure basic needs are met so the patient can continue to focus on medication adherence

## 2016-11-18 NOTE — Telephone Encounter (Signed)
RN contact [facility] and prearranged visit was confirmed for today

## 2016-11-19 NOTE — Progress Notes (Signed)
RN made a home with with [name] today. Focus of today's visit is to ensure that the patient understands that today is the day for his TB medications and to assess his adherence with his HIV regimen. [name] stated he has not been taking his medications because he was not sure if he can take the medications and receive Chemotherapy if needed. RN contacted [facility] and confirmed that he will not be receiving chemotherapy at this time and it will be safe to start TB regimen. Their office will be faxing over the last visit note. RN informed [name] that he can take his medications as planned and to be sure he starts his TB regimen today. [name] stated he will eat, take have the medications and then take the other half after about 30 minutes. [name] has to take 9 pills every Friday morning until his TB treatment is complete)  [name] verbalized a understanding that he is to start taking his medications each day as previously planned.

## 2016-11-19 NOTE — Progress Notes (Signed)
RN made a home visit with the patient today. [name] stated he is taking his medications as ordered but noticed the TB medications make him vomit. [name] stated he took half the medication and then became sick on his stomach which resulting in him vomitting. RN instructed the patient to make sure he is putting a good amount of food on his stomach prior to taking his medications. Patient verbalized understanding and stated he will continue to try. RN refilled the patient's pill box to prepare for the next couple of weeks.

## 2016-11-19 NOTE — Progress Notes (Signed)
RN made a co-Visit with pharmacist to discuss [name]'s new regimen. Cassie explained to [name] his new regimen which includes a change to his HIV regimen plus a vitamin along with 12 weeks of TB treatment. RN identified that [name] was not able to comprehend what days he is to take what due to his inability to read. RN informed the patient that I will number each day of the week and he knows that he should start on Mondays which will be labeled with a "1". RN explained to [name] that day "5" includes his TB medications and he must take the medications each week. RN agreed to refill the patient's medications and make weekly visits to assess adherence. [name] was able to verbalize what that he is to start with Day 1 every Monday and Day 5 includes his TB medications. [name] was also able to verbalize that each numbered day includes a AM and PM slot of medications that he should take daily.

## 2016-11-20 ENCOUNTER — Encounter: Payer: Self-pay | Admitting: Internal Medicine

## 2016-11-20 ENCOUNTER — Ambulatory Visit (INDEPENDENT_AMBULATORY_CARE_PROVIDER_SITE_OTHER): Payer: Medicare Other | Admitting: Internal Medicine

## 2016-11-20 VITALS — BP 166/78 | HR 71 | Temp 97.4°F | Ht 64.0 in | Wt 129.0 lb

## 2016-11-20 DIAGNOSIS — R112 Nausea with vomiting, unspecified: Secondary | ICD-10-CM

## 2016-11-20 DIAGNOSIS — R7611 Nonspecific reaction to tuberculin skin test without active tuberculosis: Secondary | ICD-10-CM | POA: Diagnosis not present

## 2016-11-20 DIAGNOSIS — F321 Major depressive disorder, single episode, moderate: Secondary | ICD-10-CM | POA: Diagnosis not present

## 2016-11-20 DIAGNOSIS — B20 Human immunodeficiency virus [HIV] disease: Secondary | ICD-10-CM | POA: Diagnosis not present

## 2016-11-20 DIAGNOSIS — Z227 Latent tuberculosis: Secondary | ICD-10-CM

## 2016-11-20 MED ORDER — ONDANSETRON 8 MG PO TBDP: 8.0000 mg | ORAL_TABLET | Freq: Three times a day (TID) | ORAL | 0 refills | Status: DC | PRN

## 2016-11-20 NOTE — Assessment & Plan Note (Signed)
His depression is in remission. 

## 2016-11-20 NOTE — Progress Notes (Signed)
HPI: [name] is a 61 y.o. male who presents to the [name] clinic today to see Dr. Megan Salon for his HIV and latent TB infection.   Allergies: Allergies  Allergen Reactions  . Aspirin Shortness Of Breath, Nausea And Vomiting and Swelling    Past Medical History: Past Medical History:  Diagnosis Date  . Asthma with COPD ([place])   . Bladder cancer ([facility])    and prostatic urethra cancer  HX TURBT'S IN [place], [place]  WITH INSTILLATION CHEMO TX'S  . Bladder tumor   . History of transfusion   . HIV disease ([place])    under care of [facility]  . Hypertension   . Nocturia     Social History: Social History   Social History  . Marital status: Legally Separated    Spouse name: N/A  . Number of children: N/A  . Years of education: N/A   Social History Main Topics  . Smoking status: Current Every Day Smoker    Packs/day: 0.10    Years: 17.00    Types: Cigarettes  . Smokeless tobacco: Never Used     Comment: 3/day; looking into vaping instead  . Alcohol use 3.6 oz/week    6 Cans of beer per week     Comment: occasional  . Drug use: No  . Sexual activity: Yes    Partners: Female    Birth control/ protection: Condom     Comment: given condoms   Other Topics Concern  . None   Social History Narrative  . None    Current Regimen: Triumeq in the AM, Tivicay in the PM  Labs: HIV 1 RNA Quant (copies/mL)  Date Value  09/30/2016 <20 DETECTED (A)  07/01/2016 <20  03/11/2016 168 (H)   CD4 T Cell Abs (/uL)  Date Value  09/30/2016 740  07/01/2016 690  12/24/2015 910   Hep B S Ab (no units)  Date Value  03/20/2015 NEG   Hepatitis B Surface Ag (no units)  Date Value  03/20/2015 NEGATIVE   HCV Ab (no units)  Date Value  03/20/2015 NEGATIVE    CrCl: CrCl cannot be calculated (Patient's most recent lab result is older than the maximum 21 days allowed.).  Lipids:    Component Value Date/Time   CHOL 129 10/17/2015 1058   TRIG 43  10/17/2015 1058   HDL 92 10/17/2015 1058   CHOLHDL 1.4 10/17/2015 1058   VLDL 9 10/17/2015 1058   LDLCALC 28 10/17/2015 1058    Assessment: [name] is here today for follow-up.  He is doing quite well and his infection is back under control.  He is getting sick with his Friday medications since he has to take so much with his latent TB medications added.  Explained Zofran to him and sent in an Rx for him to pick up and take 30 minutes before taking his medications on Friday.  After he completes his latent TB treatment, we can consolidate back to a 1 pill once daily regimen - probably Biktarvy.   Plans: - Continue Triumeq in the AM and Tivicay in the PM - Isoniazid and rifapentine once weekly on Fridays  [name], PharmD, [place] [facility] 11/20/2016, 2:30 PM

## 2016-11-20 NOTE — Progress Notes (Signed)
Patient Active Problem List   Diagnosis Date Noted  . HIV disease ([place]) 04/18/2015    Priority: High  . Dysuria 05/29/2015    Priority: Medium  . Depression 04/19/2015    Priority: Medium  . 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 Malignant neoplasm of bladder ([place]) 03/30/2015    Priority: Medium  . Exacerbation of asthma 09/09/2016  . Acute respiratory failure with hypoxia ([place]) 09/09/2016  . Unintentional weight loss 07/01/2016  . Anorexia 03/11/2016  . Syncope 10/31/2015  . HTN (hypertension) 03/30/2015  . Asthma, chronic 03/30/2015    Patient's Medications  New Prescriptions   ONDANSETRON (ZOFRAN ODT) 8 MG DISINTEGRATING TABLET    Take 1 tablet (8 mg total) by mouth every 8 (eight) hours as needed for nausea or vomiting.  Previous Medications   ABACAVIR-DOLUTEGRAVIR-LAMIVUDINE (TRIUMEQ) 600-50-300 MG TABLET    Take 1 tablet by mouth every morning.   ALBUTEROL (PROVENTIL) (2.5 MG/3ML) 0.083% NEBULIZER SOLUTION    Take 3 mLs (2.5 mg total) by nebulization every 6 (six) hours as needed for wheezing or shortness of breath. DX code:J45.901   AMLODIPINE (NORVASC) 5 MG TABLET    TAKE 1.5  TABLET (7.5 MG TOTAL) BY MOUTH DAILY.   BENZONATATE (TESSALON) 200 MG CAPSULE    Take 1 capsule (200 mg total) by mouth 3 (three) times daily as needed for cough.   CETIRIZINE (ZYRTEC) 10 MG TABLET    Take 1 tablet (10 mg total) by mouth at bedtime.   DOLUTEGRAVIR (TIVICAY) 50 MG TABLET    Take 1 tablet (50 mg total) by mouth at bedtime.   ISONIAZID (NYDRAZID) 300 MG TABLET    Take 3 tablets (900 mg total) by mouth once a week. On Friday.   MEGESTROL (MEGACE) 400 MG/10ML SUSPENSION    Take 10 mLs (400 mg total) by mouth daily.   PYRIDOXINE (B-6) 50 MG TABLET    Take 1 tablet (50 mg total) by mouth daily.   RIFAPENTINE 150 MG TABS    Take 6 tablets (900 mg total) by mouth once a week. On Friday.  Modified Medications   No medications on file  Discontinued  Medications   OXYCODONE-ACETAMINOPHEN (PERCOCET/ROXICET) 5-325 MG TABLET    Take 1-2 tablets by mouth every 6 (six) hours as needed for severe pain.    Subjective: Timonthy is in for his routine HIV follow-up visit. He states that he is feeling well. He is not feeling depressed. He is using his pillbox and taking all of his medications under the direction of [name], our Facilities manager. He is taking Truvada and Tivicay while he is on therapy for latent tuberculosis. He takes his isoniazid and rifapentine pending every Friday. When he took all of them at the same time he had some nausea with vomiting. He is now split up the isoniazid and rifapentine but is still having some nausea.  Review of Systems: Review of Systems  Constitutional: Negative for chills, fever and weight loss.  Gastrointestinal: Positive for nausea and vomiting. Negative for abdominal pain and diarrhea.  Psychiatric/Behavioral: Negative for depression.    Past Medical History:  Diagnosis Date  . Asthma with COPD ([place])   . Bladder cancer ([facility])    and prostatic urethra cancer  HX TURBT'S IN [place], [place]  WITH INSTILLATION CHEMO TX'S  . Bladder tumor   . History of transfusion   . HIV disease ([place])  under care of [facility]  . Hypertension   . Nocturia     Social History  Substance Use Topics  . Smoking status: Current Every Day Smoker    Packs/day: 0.10    Years: 17.00    Types: Cigarettes  . Smokeless tobacco: Never Used     Comment: 3/day; looking into vaping instead  . Alcohol use 3.6 oz/week    6 Cans of beer per week     Comment: occasional    Family History  Problem Relation Age of Onset  . Cancer Mother     Gastric Cancer  . Hypertension Mother   . Heart disease Mother   . Cancer Maternal Grandmother     breast cancer     Allergies  Allergen Reactions  . Aspirin Shortness Of Breath, Nausea And Vomiting and Swelling    Objective:  Vitals:    11/20/16 1002  BP: (!) 166/78  Pulse: 71  Temp: 97.4 F (36.3 C)  TempSrc: Oral  Weight: 129 lb (58.5 kg)  Height: 5\' 4"  (1.626 m)   Body mass index is 22.14 kg/m.  Physical Exam  Constitutional: He is oriented to person, place, and time. No distress.  Cardiovascular: Normal rate and regular rhythm.   No murmur heard. Pulmonary/Chest: Breath sounds normal.  Abdominal: Soft. There is no tenderness.  Neurological: He is alert and oriented to person, place, and time.  Psychiatric: Mood and affect normal.    Lab Results Lab Results  Component Value Date   WBC 4.1 09/30/2016   HGB 12.0 (L) 09/30/2016   HCT 36.1 (L) 09/30/2016   MCV 102.0 (H) 09/30/2016   PLT 309 09/30/2016    Lab Results  Component Value Date   CREATININE 0.88 09/30/2016   BUN 9 09/30/2016   NA 137 09/30/2016   K 3.9 09/30/2016   CL 108 09/30/2016   CO2 22 09/30/2016    Lab Results  Component Value Date   ALT 16 09/30/2016   AST 26 09/30/2016   ALKPHOS 70 09/30/2016   BILITOT 0.3 09/30/2016    Lab Results  Component Value Date   CHOL 129 10/17/2015   HDL 92 10/17/2015   LDLCALC 28 10/17/2015   TRIG 43 10/17/2015   CHOLHDL 1.4 10/17/2015   HIV 1 RNA Quant (copies/mL)  Date Value  09/30/2016 <20 DETECTED (A)  07/01/2016 <20  03/11/2016 168 (H)   CD4 T Cell Abs (/uL)  Date Value  09/30/2016 740  07/01/2016 690  12/24/2015 910     Problem List Items Addressed This Visit      High   HIV disease ([place])    His infection is under excellent control since restarting therapy in 2016. Once he is through with his therapy for latent tuberculosis we can consolidate his Triumeq and Tivicay back to a 1 pill a day regimen. He would like to have a pill that is small as possible. He will follow-up after lab work in 3 months.      Relevant Orders   T-helper cell (CD4)- ([name] clinic only)   HIV 1 RNA quant-no reflex-bld   CBC   Comprehensive metabolic panel     Medium   Depression    His  depression is in remission.      Latent tuberculosis by blood test    We will give him odansetron to take as needed before his isoniazid and rifapentine to help prevent nausea and vomiting. He has about 6 more weeks of  therapy.       Other Visit Diagnoses    Nausea and vomiting, intractability of vomiting not specified, unspecified vomiting type    -  Primary   Relevant Medications   ondansetron (ZOFRAN ODT) 8 MG disintegrating tablet        Michel Bickers, MD [facility] [place] [phone] pager   [phone] cell 11/20/2016, 11:12 AM

## 2016-11-20 NOTE — Assessment & Plan Note (Signed)
His infection is under excellent control since restarting therapy in 2016. Once he is through with his therapy for latent tuberculosis we can consolidate his Triumeq and Tivicay back to a 1 pill a day regimen. He would like to have a pill that is small as possible. He will follow-up after lab work in 3 months.

## 2016-11-20 NOTE — Assessment & Plan Note (Signed)
We will give him odansetron to take as needed before his isoniazid and rifapentine to help prevent nausea and vomiting. He has about 6 more weeks of therapy.

## 2016-11-24 NOTE — Progress Notes (Signed)
RN made a home visit with the patient today. [name] stated he is taking his medications and nauseous feelings has improved some. Patient stated his stool is green and urine is orange. RN instructed the patient that it is due to his medications. RN instructed the patient to make sure he is putting a good amount of food on his stomach prior to taking his medications. Patient verbalized understanding and stated he will continue to try. RN refilled the patient's pill box to prepare for the next couple of weeks.

## 2016-11-24 NOTE — Progress Notes (Signed)
RN arrived at the patient's home for our prearranged visit. RN contacted the patient to say that I am downstairs. [name] asked if I could come back on Friday since he is on his way to pay some bills. RN agreed and visit has been rescheduled for this Friday

## 2016-11-25 MED FILL — TRIUMEQ 600-50-300 MG TABS: 600-50-300 | 30 days supply | Qty: 30 | Fill #2

## 2016-11-25 MED FILL — ISONIAZID 300 MG TABLET: 300 | 28 days supply | Qty: 12 | Fill #2

## 2016-11-25 MED FILL — PRIFTIN 150 MG TABLET: 150 | 28 days supply | Qty: 24 | Fill #2

## 2016-11-25 MED FILL — TIVICAY 50 MG TABLET: 50 | 30 days supply | Qty: 30 | Fill #2

## 2016-11-25 MED FILL — AMLODIPINE BESYLATE 5 MG TA: 5 | 30 days supply | Qty: 30 | Fill #2

## 2016-11-26 ENCOUNTER — Ambulatory Visit: Payer: Self-pay | Admitting: *Deleted

## 2016-11-26 VITALS — BP 158/62

## 2016-11-26 DIAGNOSIS — B20 Human immunodeficiency virus [HIV] disease: Secondary | ICD-10-CM

## 2016-11-26 DIAGNOSIS — Z227 Latent tuberculosis: Secondary | ICD-10-CM

## 2016-12-09 ENCOUNTER — Ambulatory Visit: Payer: Self-pay | Admitting: *Deleted

## 2016-12-09 ENCOUNTER — Telehealth: Payer: Self-pay | Admitting: *Deleted

## 2016-12-09 VITALS — BP 156/82 | HR 72

## 2016-12-09 DIAGNOSIS — B20 Human immunodeficiency virus [HIV] disease: Secondary | ICD-10-CM

## 2016-12-09 NOTE — Telephone Encounter (Signed)
RN contacted Pharmacy/Betty/Cassie to assess the interaction risk of patient starting Zyrtec along with his current regimen. Cassie stated the interaction is minimal to none and Zyrtec will be fine.

## 2016-12-10 ENCOUNTER — Telehealth: Payer: Self-pay | Admitting: *Deleted

## 2016-12-10 ENCOUNTER — Ambulatory Visit: Payer: Self-pay | Admitting: *Deleted

## 2016-12-10 DIAGNOSIS — J4521 Mild intermittent asthma with (acute) exacerbation: Secondary | ICD-10-CM

## 2016-12-10 DIAGNOSIS — R0602 Shortness of breath: Secondary | ICD-10-CM

## 2016-12-10 NOTE — Telephone Encounter (Addendum)
RN contacted [employer] and the cost of a 90 day supply of Zyrtec is 4.90 cent. RN contacted the patient who stated he can afford the cost. RN will arrange for the patient to get the medication   RN traveled to [employer] and picked up the patient's medications then delivered the medications to the patient's home  On physical assessment the Jarnell appears weak and tries with minimal exertion. Vitals are stable.   Recertification is needed at this time due to the unmet goal of medication adherence. Frequency / Duration of [facility]N visits: Effective 12/16/16:  72mo1, 1every 2 weeks for 60 days, 3PRN's for complications with disease process/progression, medication changes or concerns  [facility] will assess for learning needs related to diagnosis and treatment regimen, provide education as needed, fill pill box if needed, address any barriers which may be preventing medication compliance, and communicating with care team including physician and case manager. Individualized Plan Of Care, Certification Period of 12/15/2016 to 03/16/2017 a. Type of service(s) and care to be delivered: [facility] Nurse   Frequency and duration of service: Effective: 12/15/16:  50mo1, 1every 2 weeks for 60 days, 3PRN's for complications with disease process/progression, medication changes or concerns b. Activity restrictions: Pt may be up as tolerated and can safely ambulate without the need for a assistive device. Pt wears glasses c. Safety Measures: Standard Precautions/Infection Control d. Service Objectives and Goals:Service Objectives are to assist the pt with HIV medication regimen adherence and staying in care with the Infectious Disease Clinic by identifying barriers to care. RN will address the barriers that are identified by the patient.   Patient continues to have unmet goals with adherence questionable. Pt identified a goal of getting over his Bladder Cancer and would like to be in a  relationship with a male who is also positive. pt does not feel comfortable disclosing his status. Current RN goal for this patient is to offer a lot of emotional support and encouragement. RN would like to accompany the patient to his Clinic appts, with his permission, to ensure he has a understanding of information provided.  e. Equipment required: No additional equipment needs at this time f. Functional Limitations: Vision. Pt has corrective glasses that he wears g. Rehabilitation potential: Guarded h. Diet and Nutritional Needs: Regular Diet i. Medications and treatments: Medications have been reconciled and reviewed and are a part of EPIC electronic file j. Specific therapies if needed: RN k. Pertinent diagnoses: Hypertension, Asthma, Bladder Cancer, Depression l. Expected outcome: Guarded m.

## 2016-12-16 NOTE — Progress Notes (Signed)
During last visit with the patient RN made plans to have medications refilled. RN gathered the patient's medications prior to today visit and refilled the patient's pill box at this time. Mr Urbanek denies missing any doses. During today's visit the patient's brother [name] was present and the patient asked me if I would explain to his brother his HIV status. Together the 3 of [place] had a open relationship about Mr. Blatt HIV status. Mr Macpherson stated he could trust his brother and wanted someone to know exactly what he was dealing with medically. Prior to this visit the patient would only disclose to his entire family that he has bladder cancer. RN was happy to explain the details to the patient's brother and that Mr. Fooks is doing a great job with adhering to his medications. April's brother stated he always knew something more was going on with Marcell and he is happy to be able to support him fully.

## 2016-12-19 ENCOUNTER — Telehealth: Payer: Self-pay | Admitting: *Deleted

## 2016-12-23 ENCOUNTER — Ambulatory Visit: Payer: Self-pay | Admitting: *Deleted

## 2016-12-23 VITALS — BP 162/72

## 2016-12-23 DIAGNOSIS — B20 Human immunodeficiency virus [HIV] disease: Secondary | ICD-10-CM

## 2016-12-23 DIAGNOSIS — Z227 Latent tuberculosis: Secondary | ICD-10-CM

## 2016-12-23 NOTE — Progress Notes (Signed)
RN made a home visit with [name] who stated he continues to take his medications each day without any problems. RN refilled 2 weeks worth of medications for the patient and contacted Pharmacy to refills. Patient states he has been having some Asthma episodes and so I suggested that the patient begin taking his allergy medications. RN checked with pharmacy to ensure that the patient does not have medication interactions. No interactions noted and RN will get the patient his allergy medications OTC as ordered.

## 2016-12-24 ENCOUNTER — Encounter: Payer: Self-pay | Admitting: Pharmacist

## 2016-12-24 MED FILL — GENVOYA TABLET: 150-150-200 | 30 days supply | Qty: 30 | Fill #0

## 2016-12-24 MED FILL — AMLODIPINE BESYLATE 5 MG TA: 5 | 30 days supply | Qty: 30 | Fill #3

## 2016-12-24 NOTE — Progress Notes (Signed)
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  He will stop Tivicay and Triumeq as well. Pharmacy, [facility], and patient aware.

## 2016-12-31 ENCOUNTER — Ambulatory Visit: Payer: Self-pay | Admitting: *Deleted

## 2016-12-31 DIAGNOSIS — B20 Human immunodeficiency virus [HIV] disease: Secondary | ICD-10-CM

## 2017-01-05 NOTE — Telephone Encounter (Signed)
RN contacted [facility] and requested a refill of all pt's medications to be sent to [name] for pick up next week.

## 2017-01-06 ENCOUNTER — Telehealth: Payer: Self-pay | Admitting: *Deleted

## 2017-01-06 NOTE — Telephone Encounter (Signed)
Contacted the patient to confirm our prearranged visit for today at 10:30. [name] asked if we could reschedule for tomorrow so he can go and pay his light bill this morning. Visit rescheduled for tomorrow at 10:30

## 2017-01-07 ENCOUNTER — Telehealth: Payer: Self-pay | Admitting: *Deleted

## 2017-01-07 ENCOUNTER — Ambulatory Visit: Payer: Self-pay | Admitting: *Deleted

## 2017-01-07 VITALS — BP 132/84

## 2017-01-07 DIAGNOSIS — Z550 Illiteracy and low-level literacy: Secondary | ICD-10-CM

## 2017-01-07 DIAGNOSIS — Z227 Latent tuberculosis: Secondary | ICD-10-CM

## 2017-01-07 DIAGNOSIS — C670 Malignant neoplasm of trigone of bladder: Secondary | ICD-10-CM

## 2017-01-07 DIAGNOSIS — B20 Human immunodeficiency virus [HIV] disease: Secondary | ICD-10-CM

## 2017-01-07 NOTE — Progress Notes (Addendum)
Spoke with the patient and he has been having episode of needing his neb treatment more often. RN informed the patient that if may be a good idea to start on his allergy mediations.  Traveled to [place] and purchased over the [employer] which has been ordered by the patient's PCP.  RN then traveled to the patient's home and added the Zyrtec to his current pill boxes  RN educated the patient that if his SOB is not relieved or gets worst to please seek medical attention. [name] verbalized understanding and promised me that if he has to seek medical attention he will call me from the hospital  [name], Certification Period of 12/16/2016 to 03/16/2017 a. Type of service(s) and care to be delivered: [facility] Nurse   Frequency and duration of service: Effective: 12/16/16:  61mo3,1mo1, 3PRN's for complications with disease process/progression, medication changes or concerns b. Activity restrictions: Pt may be up as tolerated and can safely ambulate without the need for a assistive device. Pt wears glasses c. Safety Measures: Standard Precautions/Infection Control d. Service Objectives and Goals:Service Objectives are to assist the pt with HIV medication regimen adherence and staying in care with the Infectious Disease Clinic by identifying barriers to care. RN will address the barriers that are identified by the patient.   Patient continues to have unmet goals with adherence questionable. Pt identified a goal of getting over his Bladder Cancer and would like to be in a relationship with a male who is also positive. pt does not feel comfortable disclosing his status. Current RN goal for this patient is to offer a lot of emotional support and encouragement.He continues to struggle with eating regularly as well. RN would like to accompany the patient to his Clinic appts, with his permission, to ensure he has a understanding of information provided.  e. Equipment  required: No additional equipment needs at this time f. Functional Limitations: Vision. Pt has corrective glasses that he wears g. Rehabilitation potential: Guarded h. Diet and Nutritional Needs: Regular Diet i. Medications and treatments: Medications have been reconciled and reviewed and are a part of EPIC electronic file j. Specific therapies if needed: RN k. Pertinent diagnoses: Hypertension, Asthma, Bladder Cancer, Depression l. Expected outcome: Guarded

## 2017-01-07 NOTE — Telephone Encounter (Signed)
RN contacted patient and confirmed visit for today.  Patient is ok with me coming at 10 instead of 10:30. Prior to visit RN will travel to [name] and get the patient's refilled medications.

## 2017-01-07 NOTE — Progress Notes (Signed)
During last visit with the patient RN sat with Allen's brother and openly discussed his HIV status. Currently, [name] denies missing any doses. Durin today's visit medication education, review of TB plan and pillbox refill was provided. RN plans to see the patient once more this month for medication management.

## 2017-01-07 NOTE — Patient Instructions (Signed)
Plan is to see patient the week of 05/21 for medication management and pillbox fills

## 2017-01-09 NOTE — Progress Notes (Signed)
Prior to making a visit with the patient RN spoke with [employer] about how the patient can ride the city bus at no cost. [employer] stated if [name] has been approved for SCAT he can choose to ride the SCAT or city bus free of charge. He must present his SCAT ID card to to the city bus driver to be able to ride at no cost.  RN contacted [name] to see if he has a SCAT ID card. [name] stated he has 2 cards but does not think he has a SCAT ID.  RN traveled to [employer] home and took a picture of the transportation ID's that he has on hand. While at the home RN also contacted Dr Herrick's office for the patient's next appt date. During this visit RN updated [name]'s calendar with his next [name] lab appt date, Dr Hale Bogus appt date/time and Dr Herrick's appt date and time.  Plan at this time is to f/u with [employer] to see if either of the cards that [name] has is a SCAT card. [name] has identified transportation and transportation cost has impacted his compliance with appointments

## 2017-01-12 ENCOUNTER — Ambulatory Visit: Payer: Self-pay | Admitting: *Deleted

## 2017-01-12 ENCOUNTER — Telehealth: Payer: Self-pay | Admitting: *Deleted

## 2017-01-12 DIAGNOSIS — B20 Human immunodeficiency virus [HIV] disease: Secondary | ICD-10-CM

## 2017-01-12 DIAGNOSIS — Z550 Illiteracy and low-level literacy: Secondary | ICD-10-CM

## 2017-01-12 NOTE — Telephone Encounter (Signed)
I approve this plan of care.

## 2017-01-12 NOTE — Telephone Encounter (Signed)
RN received a message from Shelia/[employer]AT transportation to make me aware that she has received the SCAT application that I submitted for [name]. She stated if I needed further information to please contact Shelia at [phone]

## 2017-01-14 NOTE — Progress Notes (Signed)
RN made rescheduled visit today. RN delivered [name]'s refills and refilled 1 pill box. RN explained to [name] that after he finishes his TB medications his treatment for TB will be completed. [name] verbalized understanding and stated he continues to take his medication each day. Plan is to prepare [name] for D/C if he continues to adhere to his medication regimen

## 2017-01-14 NOTE — Progress Notes (Signed)
Completed SCAT application for the patient, RN noted that we missed a signature on the application. Contacted Laithan and he is available for me to come by and review/sign his SCAT application. Kaelem also asked if I would help him with resigning his lease.I agreed to help him if he will just let me know when his appt with the leasing office is set up.  Jourdan stated he has to go to Social [employer] to get a copy of his annual disability benefits statement. Normand Damron that I can print his beneift letter off line and save him a trip. RN printed Lovelle's benefit letter from [email or web address]. Traveled to Dashiel's home and was able to get the missing signature, traveled to [facility] and got Dr Hale Bogus signature. Application was scanned and emailed with a confirmation email received from the SCAT department/Shelia stating she has received the SCAT application

## 2017-01-20 ENCOUNTER — Telehealth: Payer: Self-pay | Admitting: Pharmacist

## 2017-01-20 ENCOUNTER — Other Ambulatory Visit: Payer: Self-pay | Admitting: Pharmacist

## 2017-01-20 DIAGNOSIS — J45909 Unspecified asthma, uncomplicated: Secondary | ICD-10-CM

## 2017-01-20 DIAGNOSIS — B20 Human immunodeficiency virus [HIV] disease: Secondary | ICD-10-CM

## 2017-01-20 MED ORDER — ELVITEG-COBIC-EMTRICIT-TENOFAF 150-150-200-10 MG PO TABS: 1.0000 | ORAL_TABLET | Freq: Every day | ORAL | 6 refills | Status: DC

## 2017-01-20 MED ORDER — ALBUTEROL SULFATE (2.5 MG/3ML) 0.083% IN NEBU: 2.5000 mg | INHALATION_SOLUTION | Freq: Four times a day (QID) | RESPIRATORY_TRACT | 10 refills | Status: DC | PRN

## 2017-01-20 MED FILL — GENVOYA TABLET: 150-150-200 | 30 days supply | Qty: 30 | Fill #0

## 2017-01-20 NOTE — Telephone Encounter (Signed)
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 Pt now back on Genvoya. Sent to [facility].

## 2017-01-23 ENCOUNTER — Ambulatory Visit: Payer: Self-pay | Admitting: *Deleted

## 2017-01-23 DIAGNOSIS — Z550 Illiteracy and low-level literacy: Secondary | ICD-10-CM

## 2017-01-23 DIAGNOSIS — B20 Human immunodeficiency virus [HIV] disease: Secondary | ICD-10-CM

## 2017-02-03 ENCOUNTER — Telehealth: Payer: Self-pay | Admitting: *Deleted

## 2017-02-03 ENCOUNTER — Ambulatory Visit: Payer: Self-pay | Admitting: *Deleted

## 2017-02-03 DIAGNOSIS — B20 Human immunodeficiency virus [HIV] disease: Secondary | ICD-10-CM

## 2017-02-03 NOTE — Telephone Encounter (Signed)
RN contacted patient to confirm visit for today. Visit confirmed for around 10:30

## 2017-02-05 ENCOUNTER — Ambulatory Visit: Payer: Self-pay | Admitting: *Deleted

## 2017-02-05 ENCOUNTER — Other Ambulatory Visit: Payer: Medicare Other

## 2017-02-05 DIAGNOSIS — Z550 Illiteracy and low-level literacy: Secondary | ICD-10-CM

## 2017-02-05 DIAGNOSIS — B20 Human immunodeficiency virus [HIV] disease: Secondary | ICD-10-CM

## 2017-02-09 ENCOUNTER — Other Ambulatory Visit: Payer: Medicare Other

## 2017-02-09 DIAGNOSIS — B20 Human immunodeficiency virus [HIV] disease: Secondary | ICD-10-CM

## 2017-02-09 LAB — COMPREHENSIVE METABOLIC PANEL
ALK PHOS: 136 U/L — AB (ref 40–115)
ALT: 23 U/L (ref 9–46)
AST: 62 U/L — ABNORMAL HIGH (ref 10–35)
Albumin: 3.7 g/dL (ref 3.6–5.1)
BUN: 6 mg/dL — ABNORMAL LOW (ref 7–25)
CALCIUM: 8.7 mg/dL (ref 8.6–10.3)
CHLORIDE: 100 mmol/L (ref 98–110)
CO2: 22 mmol/L (ref 20–31)
Creat: 0.91 mg/dL (ref 0.70–1.25)
Glucose, Bld: 80 mg/dL (ref 65–99)
POTASSIUM: 4.7 mmol/L (ref 3.5–5.3)
Sodium: 130 mmol/L — ABNORMAL LOW (ref 135–146)
Total Bilirubin: 0.3 mg/dL (ref 0.2–1.2)
Total Protein: 7.6 g/dL (ref 6.1–8.1)

## 2017-02-09 LAB — CBC
HEMATOCRIT: 39.6 % (ref 38.5–50.0)
Hemoglobin: 13.3 g/dL (ref 13.2–17.1)
MCH: 33 pg (ref 27.0–33.0)
MCHC: 33.6 g/dL (ref 32.0–36.0)
MCV: 98.3 fL (ref 80.0–100.0)
MPV: 9.6 fL (ref 7.5–12.5)
PLATELETS: 175 10*3/uL (ref 140–400)
RBC: 4.03 MIL/uL — ABNORMAL LOW (ref 4.20–5.80)
RDW: 13 % (ref 11.0–15.0)
WBC: 2.7 10*3/uL — ABNORMAL LOW (ref 3.8–10.8)

## 2017-02-10 LAB — T-HELPER CELL (CD4) - (RCID CLINIC ONLY)
CD4 % Helper T Cell: 45 % (ref 33–55)
CD4 T CELL ABS: 720 /uL (ref 400–2700)

## 2017-02-11 LAB — HIV-1 RNA QUANT-NO REFLEX-BLD
HIV 1 RNA QUANT: 161 {copies}/mL — AB
HIV-1 RNA Quant, Log: 2.21 Log copies/mL — ABNORMAL HIGH

## 2017-02-13 MED FILL — GENVOYA TABLET: 150-150-200 | 30 days supply | Qty: 30 | Fill #1

## 2017-02-17 ENCOUNTER — Telehealth: Payer: Self-pay | Admitting: *Deleted

## 2017-02-17 NOTE — Telephone Encounter (Signed)
RN reviewed [name]'s lastest lab work from 02/09/17. Noted an increase in the viral load. I have been questioning [name] on his adherence for several weeks but he assured me that he has been taking his medication.  RN contacted [name] and we had a discussion about his recent lab work and I made [name] aware that the virus is "showing back up in his blood" [name] stated that he was worried about that. We had a discussion about the importance of taking his medication every day even when he has a couple of beers he needs to take his medications. He admitted to missing a couple of days and stated it had only been a few days when he was sick. RN verbalized understanding and encouraged [name] by get back on board with having a suppressed viral load. [name] appears proud of his suppressed viral load and I wanted to remind him of that. He was thankful and stated he wants the virus to no longer show in his blood again and will be sure to take his medications each day. [name] asked if I could be there for his upcoming appt with [name] on the 19th and I will meet him as requested

## 2017-02-19 ENCOUNTER — Ambulatory Visit: Payer: Self-pay | Admitting: *Deleted

## 2017-02-19 ENCOUNTER — Ambulatory Visit (INDEPENDENT_AMBULATORY_CARE_PROVIDER_SITE_OTHER): Payer: Medicare Other | Admitting: Internal Medicine

## 2017-02-19 ENCOUNTER — Encounter: Payer: Self-pay | Admitting: Internal Medicine

## 2017-02-19 DIAGNOSIS — R7611 Nonspecific reaction to tuberculin skin test without active tuberculosis: Secondary | ICD-10-CM

## 2017-02-19 DIAGNOSIS — R109 Unspecified abdominal pain: Secondary | ICD-10-CM | POA: Insufficient documentation

## 2017-02-19 DIAGNOSIS — R112 Nausea with vomiting, unspecified: Secondary | ICD-10-CM

## 2017-02-19 DIAGNOSIS — Z227 Latent tuberculosis: Secondary | ICD-10-CM

## 2017-02-19 DIAGNOSIS — B20 Human immunodeficiency virus [HIV] disease: Secondary | ICD-10-CM | POA: Diagnosis not present

## 2017-02-19 DIAGNOSIS — R630 Anorexia: Secondary | ICD-10-CM

## 2017-02-19 DIAGNOSIS — R1030 Lower abdominal pain, unspecified: Secondary | ICD-10-CM | POA: Diagnosis not present

## 2017-02-19 DIAGNOSIS — Z550 Illiteracy and low-level literacy: Secondary | ICD-10-CM

## 2017-02-19 LAB — URINALYSIS, MICROSCOPIC ONLY
CASTS: NONE SEEN [LPF]
Crystals: NONE SEEN [HPF]
RBC / HPF: NONE SEEN RBC/HPF (ref <=2)
YEAST: NONE SEEN [HPF]

## 2017-02-19 LAB — URINALYSIS, ROUTINE W REFLEX MICROSCOPIC
Bilirubin Urine: NEGATIVE
GLUCOSE, UA: NEGATIVE
Ketones, ur: NEGATIVE
Leukocytes, UA: NEGATIVE
Nitrite: NEGATIVE
PH: 6 (ref 5.0–8.0)
Protein, ur: NEGATIVE
Specific Gravity, Urine: 1.019 (ref 1.001–1.035)

## 2017-02-19 MED ORDER — ONDANSETRON 8 MG PO TBDP: 8.0000 mg | ORAL_TABLET | Freq: Three times a day (TID) | ORAL | 5 refills | Status: DC | PRN

## 2017-02-19 MED FILL — ONDANSETRON ODT 8 MG TABLET: 8 | 20 days supply | Qty: 60 | Fill #0

## 2017-02-19 NOTE — Progress Notes (Signed)
Patient Active Problem List   Diagnosis Date Noted  . HIV disease ([place]) 04/18/2015    Priority: High  . Dysuria 05/29/2015    Priority: Medium  . Depression 04/19/2015    Priority: Medium  . 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 Malignant neoplasm of bladder ([place]) 03/30/2015    Priority: Medium  . Abdominal pain 02/19/2017  . Exacerbation of asthma 09/09/2016  . Acute respiratory failure with hypoxia ([place]) 09/09/2016  . Unintentional weight loss 07/01/2016  . Anorexia 03/11/2016  . Syncope 10/31/2015  . HTN (hypertension) 03/30/2015  . Asthma, chronic 03/30/2015    Patient's Medications  New Prescriptions   No medications on file  Previous Medications   ALBUTEROL (PROVENTIL) (2.5 MG/3ML) 0.083% NEBULIZER SOLUTION    Take 3 mLs (2.5 mg total) by nebulization every 6 (six) hours as needed for wheezing or shortness of breath. DX code:J45.901   AMLODIPINE (NORVASC) 5 MG TABLET    TAKE 1.5  TABLET (7.5 MG TOTAL) BY MOUTH DAILY.   BENZONATATE (TESSALON) 200 MG CAPSULE    Take 1 capsule (200 mg total) by mouth 3 (three) times daily as needed for cough.   CETIRIZINE (ZYRTEC) 10 MG TABLET    Take 1 tablet (10 mg total) by mouth at bedtime.   ELVITEGRAVIR-COBICISTAT-EMTRICITABINE-TENOFOVIR (GENVOYA) 150-150-200-10 MG TABS TABLET    Take 1 tablet by mouth daily with breakfast.   MEGESTROL (MEGACE) 400 MG/10ML SUSPENSION    Take 10 mLs (400 mg total) by mouth daily.  Modified Medications   Modified Medication Previous Medication   ONDANSETRON (ZOFRAN ODT) 8 MG DISINTEGRATING TABLET ondansetron (ZOFRAN ODT) 8 MG disintegrating tablet      Take 1 tablet (8 mg total) by mouth every 8 (eight) hours as needed for nausea or vomiting.    Take 1 tablet (8 mg total) by mouth every 8 (eight) hours as needed for nausea or vomiting.  Discontinued Medications   No medications on file    Subjective: [name] is in for his routine HIV follow-up visit. He  recently completed 12 weeks of INH and rifapentine for latent tuberculosis. Once he was finished he changed from Triumeq back to [place]. About 2 weeks ago he developed lower abdominal pain, nausea, vomiting and hematuria. He has had subjective fevers and occasional mild sweats. There have been some occasions when he was unable to take his Genvoya because of the nausea. Her Facilities manager, [facility] he is with him today. She does not believe that he has any Zofran at home.  Review of Systems: Review of Systems  Constitutional: Positive for diaphoresis and fever. Negative for chills, malaise/fatigue and weight loss.  HENT: Negative for sore throat.   Respiratory: Negative for cough, sputum production and shortness of breath.   Cardiovascular: Negative for chest pain.  Gastrointestinal: Positive for abdominal pain, constipation, nausea and vomiting. Negative for diarrhea and heartburn.  Genitourinary: Positive for hematuria. Negative for dysuria and frequency.  Musculoskeletal: Negative for joint pain and myalgias.  Skin: Negative for rash.  Neurological: Negative for dizziness and headaches.  Psychiatric/Behavioral: Negative for depression and substance abuse. The patient is not nervous/anxious.     Past Medical History:  Diagnosis Date  . Asthma with COPD ([place])   . Bladder cancer ([facility])    and prostatic urethra cancer  HX TURBT'S IN [place], [place]  WITH INSTILLATION CHEMO TX'S  . Bladder tumor   . History of transfusion   .  HIV disease ([place])    under care of [place] [facility]  . Hypertension   . Nocturia     Social History  Substance Use Topics  . Smoking status: Current Every Day Smoker    Packs/day: 0.10    Years: 17.00    Types: Cigarettes  . Smokeless tobacco: Never Used     Comment: 3/day; looking into vaping instead  . Alcohol use 3.6 oz/week    6 Cans of beer per week     Comment: occasional    Family History  Problem Relation Age of Onset   . Cancer Mother        Gastric Cancer  . Hypertension Mother   . Heart disease Mother   . Cancer Maternal Grandmother        breast cancer     Allergies  Allergen Reactions  . Aspirin Shortness Of Breath, Nausea And Vomiting and Swelling    Objective:  Vitals:   02/19/17 0930  BP: (!) 169/80  Pulse: 86  Weight: 125 lb (56.7 kg)   Body mass index is 21.46 kg/m.  Physical Exam  Constitutional: He is oriented to person, place, and time.  He is talkative. He was worried that I was going to be upset with him because he had missed some doses of his medication.  Cardiovascular: Normal rate.   No murmur heard. Pulmonary/Chest: Effort normal and breath sounds normal.  Abdominal: Soft. He exhibits no distension. There is tenderness.  Neurological: He is alert and oriented to person, place, and time.  Skin: No rash noted.  Psychiatric: Mood and affect normal.    Lab Results Lab Results  Component Value Date   WBC 2.7 (L) 02/09/2017   HGB 13.3 02/09/2017   HCT 39.6 02/09/2017   MCV 98.3 02/09/2017   PLT 175 02/09/2017    Lab Results  Component Value Date   CREATININE 0.91 02/09/2017   BUN 6 (L) 02/09/2017   NA 130 (L) 02/09/2017   K 4.7 02/09/2017   CL 100 02/09/2017   CO2 22 02/09/2017    Lab Results  Component Value Date   ALT 23 02/09/2017   AST 62 (H) 02/09/2017   ALKPHOS 136 (H) 02/09/2017   BILITOT 0.3 02/09/2017    Lab Results  Component Value Date   CHOL 129 10/17/2015   HDL 92 10/17/2015   LDLCALC 28 10/17/2015   TRIG 43 10/17/2015   CHOLHDL 1.4 10/17/2015   Lab Results  Component Value Date   LABRPR NON REAC 10/17/2015   HIV 1 RNA Quant (copies/mL)  Date Value  02/09/2017 161 (H)  09/30/2016 <20 DETECTED (A)  07/01/2016 <20   CD4 T Cell Abs (/uL)  Date Value  02/09/2017 720  09/30/2016 740  07/01/2016 690     Problem List Items Addressed This Visit      High   HIV disease ([place])    His viral load is up slightly. He is concerned  about that but I told him that it will not be a problem if we can make sure that he is able to take and keep his Genvoya down each day. He will follow-up here in 2 weeks.        Medium   Latent tuberculosis by blood test    He has completed therapy for latent tuberculosis.        Unprioritized   Abdominal pain    I'm concerned that he may have cystitis but, of course his pain  and hematuria may be related to his bladder cancer. I will check a UA and culture today. I refilled his Zofran and instructed him to take it up to 3 times daily for nausea and vomiting.       Other Visit Diagnoses    Nausea and vomiting, intractability of vomiting not specified, unspecified vomiting type       Relevant Medications   ondansetron (ZOFRAN ODT) 8 MG disintegrating tablet        Michel Bickers, MD [facility] for [facility] [place] [phone] pager   [phone] cell 02/19/2017, 10:10 AM

## 2017-02-19 NOTE — Progress Notes (Signed)
RN meet the patient as requested for his visit with [name]. After being roomed the patient shared with me that he has not been completely upfront with me about taking his medication and how he has been feeling. [name] shared that he has been unable to keep anything on his stomach along with abdominal pain that at times is unbearable. [name] stated the pain is just pain and he would not describe it as burning. Recently his symptoms has became worst with passing blood while urinating. [name] is concerned and wanted to be sure we can all work as a team to care for him. Focus of today's visit is to be the second ear for [name] and to reinforce Dr Hale Bogus plan for [name]

## 2017-02-19 NOTE — Assessment & Plan Note (Signed)
His viral load is up slightly. He is concerned about that but I told him that it will not be a problem if we can make sure that he is able to take and keep his Genvoya down each day. He will follow-up here in 2 weeks.

## 2017-02-19 NOTE — Assessment & Plan Note (Signed)
He has completed therapy for latent tuberculosis.

## 2017-02-19 NOTE — Assessment & Plan Note (Signed)
I'm concerned that he may have cystitis but, of course his pain and hematuria may be related to his bladder cancer. I will check a UA and culture today. I refilled his Zofran and instructed him to take it up to 3 times daily for nausea and vomiting.

## 2017-02-20 LAB — URINE CULTURE: ORGANISM ID, BACTERIA: NO GROWTH

## 2017-02-27 ENCOUNTER — Telehealth: Payer: Self-pay | Admitting: *Deleted

## 2017-02-27 NOTE — Telephone Encounter (Signed)
Individualized Plan Of Care, Certification Period of 12/16/2016 to 03/16/2017 a. Type of service(s) and care to be delivered: [facility] Nurse   Frequency and duration of service: Effective: 12/16/16:  66mo3,1mo1, 3PRN's for complications with disease process/progression, medication changes or concerns b. Activity restrictions: Pt may be up as tolerated and can safely ambulate without the need for a assistive device. Pt wears glasses c. Safety Measures: Standard Precautions/Infection Control d. Service Objectives and Goals:Service Objectives are to assist the pt with HIV medication regimen adherence and staying in care with the Infectious Disease Clinic by identifying barriers to care. RN will address the barriers that are identified by the patient.   Patient continues to have unmet goals with adherence questionable. Pt identified a goal of getting over his Bladder Cancer and would like to be in a relationship with a male who is also positive. pt does not feel comfortable disclosing his status. Current RN goal for this patient is to offer a lot of emotional support and encouragement.He continues to struggle with eating regularly as well. RN would like to accompany the patient to his Clinic appts, with his permission, to ensure he has a understanding of information provided.  e. Equipment required: No additional equipment needs at this time f. Functional Limitations: Vision. Pt has corrective glasses that he wears g. Rehabilitation potential: Guarded h. Diet and Nutritional Needs: Regular Diet i. Medications and treatments: Medications have been reconciled and reviewed and are a part of EPIC electronic file j. Specific therapies if needed: RN k. Pertinent diagnoses: Hypertension, Asthma, Bladder Cancer, Depression l. Expected outcome: Guarded

## 2017-02-27 NOTE — Telephone Encounter (Signed)
I approve of this plan of care. 

## 2017-03-03 ENCOUNTER — Telehealth: Payer: Self-pay

## 2017-03-04 ENCOUNTER — Telehealth: Payer: Self-pay | Admitting: *Deleted

## 2017-03-04 NOTE — Telephone Encounter (Signed)
RN contacted the patient as an attempt to stay connected/engaged. RN left a message stating that I wanted to be sure all is well and to please let me know if I can assist in any way.  

## 2017-03-05 ENCOUNTER — Telehealth: Payer: Self-pay | Admitting: *Deleted

## 2017-03-05 NOTE — Telephone Encounter (Signed)
Contacted [name] who stated he is feeling much better. [name] stated after his last appt with [name] he may have struggled with passing blood while urinating for the next 2 days and then it went away. RN advised [name] that he must stay in contact with Dr Louis Meckel and keep his appt. [name] stated he has an appt on Monday and will be sure to make it. He also has a upcoming appt with his PCP on the 20th for a full physical

## 2017-03-09 ENCOUNTER — Ambulatory Visit (INDEPENDENT_AMBULATORY_CARE_PROVIDER_SITE_OTHER): Payer: Medicare Other | Admitting: Internal Medicine

## 2017-03-09 ENCOUNTER — Encounter: Payer: Self-pay | Admitting: Internal Medicine

## 2017-03-09 DIAGNOSIS — F3340 Major depressive disorder, recurrent, in remission, unspecified: Secondary | ICD-10-CM

## 2017-03-09 DIAGNOSIS — R1030 Lower abdominal pain, unspecified: Secondary | ICD-10-CM

## 2017-03-09 DIAGNOSIS — B20 Human immunodeficiency virus [HIV] disease: Secondary | ICD-10-CM

## 2017-03-09 NOTE — Progress Notes (Signed)
Patient Active Problem List   Diagnosis Date Noted  . HIV disease ([address]) 04/18/2015    Priority: High  . Dysuria 05/29/2015    Priority: Medium  . Depression 04/19/2015    Priority: Medium  . 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 Malignant neoplasm of bladder ([place]) 03/30/2015    Priority: Medium  . Abdominal pain 02/19/2017  . Exacerbation of asthma 09/09/2016  . Acute respiratory failure with hypoxia ([place]) 09/09/2016  . Unintentional weight loss 07/01/2016  . Anorexia 03/11/2016  . Syncope 10/31/2015  . HTN (hypertension) 03/30/2015  . Asthma, chronic 03/30/2015    Patient's Medications  New Prescriptions   No medications on file  Previous Medications   ALBUTEROL (PROVENTIL) (2.5 MG/3ML) 0.083% NEBULIZER SOLUTION    Take 3 mLs (2.5 mg total) by nebulization every 6 (six) hours as needed for wheezing or shortness of breath. DX code:J45.901   AMLODIPINE (NORVASC) 5 MG TABLET    TAKE 1.5  TABLET (7.5 MG TOTAL) BY MOUTH DAILY.   BENZONATATE (TESSALON) 200 MG CAPSULE    Take 1 capsule (200 mg total) by mouth 3 (three) times daily as needed for cough.   CETIRIZINE (ZYRTEC) 10 MG TABLET    Take 1 tablet (10 mg total) by mouth at bedtime.   ELVITEGRAVIR-COBICISTAT-EMTRICITABINE-TENOFOVIR (GENVOYA) 150-150-200-10 MG TABS TABLET    Take 1 tablet by mouth daily with breakfast.   MEGESTROL (MEGACE) 400 MG/10ML SUSPENSION    Take 10 mLs (400 mg total) by mouth daily.   ONDANSETRON (ZOFRAN ODT) 8 MG DISINTEGRATING TABLET    Take 1 tablet (8 mg total) by mouth every 8 (eight) hours as needed for nausea or vomiting.  Modified Medications   No medications on file  Discontinued Medications   No medications on file    Subjective: [name] is in for his routine HIV follow-up visit. He is feeling much better. He is no longer having abdominal pain, nausea or vomiting. He has not had any dysuria. He still occasionally sees some red in his urine and believes  that it probably is blind. He follows up with his urologist, Dr. Louis Meckel next month. He has had no problems obtaining, taking or tolerating his Genvoya. He takes it each evening with dinner. He has not missed any doses since his last visit.   Review of Systems: Review of Systems  Constitutional: Negative for chills, diaphoresis, fever, malaise/fatigue and weight loss.  HENT: Negative for sore throat.   Respiratory: Negative for cough, sputum production and shortness of breath.   Cardiovascular: Negative for chest pain.  Gastrointestinal: Negative for abdominal pain, diarrhea, heartburn, nausea and vomiting.  Genitourinary: Negative for dysuria and frequency.       As noted in history of present illness.  Musculoskeletal: Negative for joint pain and myalgias.  Skin: Negative for rash.  Neurological: Negative for dizziness and headaches.  Psychiatric/Behavioral: Negative for depression and substance abuse. The patient is not nervous/anxious.     Past Medical History:  Diagnosis Date  . 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)   . Bladder cancer ([facility])    and prostatic urethra cancer  HX TURBT'S IN [place], [place]  WITH INSTILLATION CHEMO TX'S  . Bladder tumor   . History of transfusion   . HIV disease ([place])    under care of [facility]  . Hypertension   . Nocturia     Social History  Substance Use Topics  .  Smoking status: Current Every Day Smoker    Packs/day: 0.10    Years: 17.00    Types: Cigarettes  . Smokeless tobacco: Never Used     Comment: 3/day; looking into vaping instead  . Alcohol use 3.6 oz/week    6 Cans of beer per week     Comment: occasional    Family History  Problem Relation Age of Onset  . Cancer Mother        Gastric Cancer  . Hypertension Mother   . Heart disease Mother   . Cancer Maternal Grandmother        breast cancer     Allergies  Allergen Reactions  . Aspirin Shortness Of Breath, Nausea And Vomiting and Swelling     Objective:  Vitals:   03/09/17 1341  BP: (!) 147/74  Pulse: 89  Temp: 98.3 F (36.8 C)  TempSrc: Oral  Weight: 124 lb (56.2 kg)  Height: 5\' 4"  (1.626 m)   Body mass index is 21.28 kg/m.  Physical Exam  Constitutional: He is oriented to person, place, and time.  He looks like he is feeling much better. He is in good spirits.  HENT:  Mouth/Throat: No oropharyngeal exudate.  Eyes: Conjunctivae are normal.  Cardiovascular: Normal rate and regular rhythm.   No murmur heard. Pulmonary/Chest: Effort normal and breath sounds normal.  Abdominal: Soft. He exhibits no mass. There is no tenderness.  Musculoskeletal: Normal range of motion.  Neurological: He is alert and oriented to person, place, and time.  Skin: No rash noted.  Psychiatric: Mood and affect normal.    Lab Results Lab Results  Component Value Date   WBC 2.7 (L) 02/09/2017   HGB 13.3 02/09/2017   HCT 39.6 02/09/2017   MCV 98.3 02/09/2017   PLT 175 02/09/2017    Lab Results  Component Value Date   CREATININE 0.91 02/09/2017   BUN 6 (L) 02/09/2017   NA 130 (L) 02/09/2017   K 4.7 02/09/2017   CL 100 02/09/2017   CO2 22 02/09/2017    Lab Results  Component Value Date   ALT 23 02/09/2017   AST 62 (H) 02/09/2017   ALKPHOS 136 (H) 02/09/2017   BILITOT 0.3 02/09/2017    Lab Results  Component Value Date   CHOL 129 10/17/2015   HDL 92 10/17/2015   LDLCALC 28 10/17/2015   TRIG 43 10/17/2015   CHOLHDL 1.4 10/17/2015   Lab Results  Component Value Date   LABRPR NON REAC 10/17/2015   HIV 1 RNA Quant (copies/mL)  Date Value  02/09/2017 161 (H)  09/30/2016 <20 DETECTED (A)  07/01/2016 <20   CD4 T Cell Abs (/uL)  Date Value  02/09/2017 720  09/30/2016 740  07/01/2016 690     Problem List Items Addressed This Visit      High   HIV disease ([place])    His viral load was up slightly last month but overall his adherence is very good and his CD4 count remains well up in the normal range. He will  continue Genvoya and follow-up after lab work in 3 months.      Relevant Orders   T-helper cell (CD4)- ([name] clinic only)   HIV 1 RNA quant-no reflex-bld     Medium   Depression    His depression is in remission.        Unprioritized   Abdominal pain    I'm not sure what caused his recent abdominal pain, nausea and vomiting. Although he felt  like he was having hematuria his urinalysis at the time of his visit several weeks ago was relatively normal.           Michel Bickers, [facility] [phone] pager   [phone] cell 03/09/2017, 1:56 PM

## 2017-03-09 NOTE — Assessment & Plan Note (Signed)
His viral load was up slightly last month but overall his adherence is very good and his CD4 count remains well up in the normal range. He will continue Genvoya and follow-up after lab work in 3 months.

## 2017-03-09 NOTE — Assessment & Plan Note (Signed)
His depression is in remission. 

## 2017-03-09 NOTE — Assessment & Plan Note (Signed)
I'm not sure what caused his recent abdominal pain, nausea and vomiting. Although he felt like he was having hematuria his urinalysis at the time of his visit several weeks ago was relatively normal.

## 2017-03-10 ENCOUNTER — Ambulatory Visit: Payer: Medicare Other | Admitting: Internal Medicine

## 2017-03-10 MED FILL — GENVOYA TABLET: 150-150-200 | 30 days supply | Qty: 30 | Fill #2

## 2017-03-16 NOTE — Progress Notes (Signed)
RN made a home visit who stated he has began to taking his medications again but is struggling with eating. RN advised Phu that right now I think he should be sure he is eating prior to drinking  Alcohol. RN eduated Fritz Pickerel that beer can make you feel full and does not have any real nutritional value. Informed Neema that I never judge him for what he does but I do care about him and would like to see him healthy. Lincoln verbalized understanding. During this visit we also updated his calendar with his upcoming appts for the next 4 months.

## 2017-03-16 NOTE — Progress Notes (Signed)
To continue to secure his housing [name] needs assistance with renewing his [name]. Since [name] is unable to read he requires assistance with completing this task and the apartment complex associates cannot legally assist him. RN arrived and assisted [name] with completing this process to ensure he maintains safe and affordable housing

## 2017-03-16 NOTE — Progress Notes (Signed)
RN assisted [name] with completing his food stamp renewal application after reviewing the information with [name] he signed the application. Application was sent back to Social Services via mail RN informed the patient that  I will be unavailable next week. In the event that he needs my assistance he has plenty of resources available to him. RN advised the patient that [name] will continue to provide support to him and can still be reached at [phone]. In the event of a urgent matter in which medication adherence cannot be maintained electronically, Home Care Providers are more than willing to assist. They can be reached at [phone]

## 2017-03-16 NOTE — Progress Notes (Signed)
RN traveled to the pharmacy and was able to get the Zofran filled for the patient. RN delivered the medications to the patient and we went that he should take the medications at least 30 minutes no more than a hr before he would like to eat and up to 3 times a day for nausea. [name] verbalized understanding and took a sublingual dose during over current visit

## 2017-03-23 ENCOUNTER — Ambulatory Visit: Payer: Medicare Other | Admitting: Family Medicine

## 2017-03-27 ENCOUNTER — Other Ambulatory Visit: Payer: Self-pay | Admitting: Pharmacist

## 2017-03-30 ENCOUNTER — Ambulatory Visit: Payer: Self-pay | Admitting: *Deleted

## 2017-03-30 ENCOUNTER — Telehealth: Payer: Self-pay | Admitting: *Deleted

## 2017-03-30 VITALS — BP 136/82

## 2017-03-30 DIAGNOSIS — Z550 Illiteracy and low-level literacy: Secondary | ICD-10-CM

## 2017-03-30 DIAGNOSIS — R112 Nausea with vomiting, unspecified: Secondary | ICD-10-CM

## 2017-04-01 ENCOUNTER — Encounter: Payer: Self-pay | Admitting: Family Medicine

## 2017-04-01 ENCOUNTER — Ambulatory Visit: Payer: Medicare Other | Admitting: Family Medicine

## 2017-04-01 ENCOUNTER — Ambulatory Visit (INDEPENDENT_AMBULATORY_CARE_PROVIDER_SITE_OTHER): Payer: Medicare Other | Admitting: Family Medicine

## 2017-04-01 VITALS — BP 144/60 | HR 70 | Temp 98.6°F | Resp 14 | Ht 64.0 in | Wt 126.6 lb

## 2017-04-01 DIAGNOSIS — Z0000 Encounter for general adult medical examination without abnormal findings: Secondary | ICD-10-CM | POA: Diagnosis not present

## 2017-04-01 DIAGNOSIS — R350 Frequency of micturition: Secondary | ICD-10-CM | POA: Diagnosis not present

## 2017-04-01 DIAGNOSIS — Z1329 Encounter for screening for other suspected endocrine disorder: Secondary | ICD-10-CM | POA: Diagnosis not present

## 2017-04-01 DIAGNOSIS — R739 Hyperglycemia, unspecified: Secondary | ICD-10-CM | POA: Diagnosis not present

## 2017-04-01 DIAGNOSIS — I10 Essential (primary) hypertension: Secondary | ICD-10-CM | POA: Diagnosis not present

## 2017-04-01 LAB — CBC WITH DIFFERENTIAL/PLATELET
Basophils Absolute: 0 cells/uL (ref 0–200)
Basophils Relative: 0 %
EOS ABS: 128 {cells}/uL (ref 15–500)
Eosinophils Relative: 4 %
HEMATOCRIT: 36.1 % — AB (ref 38.5–50.0)
Hemoglobin: 12.3 g/dL — ABNORMAL LOW (ref 13.2–17.1)
LYMPHS PCT: 42 %
Lymphs Abs: 1344 cells/uL (ref 850–3900)
MCH: 33.5 pg — ABNORMAL HIGH (ref 27.0–33.0)
MCHC: 34.1 g/dL (ref 32.0–36.0)
MCV: 98.4 fL (ref 80.0–100.0)
MONO ABS: 352 {cells}/uL (ref 200–950)
MONOS PCT: 11 %
MPV: 8.9 fL (ref 7.5–12.5)
NEUTROS ABS: 1376 {cells}/uL — AB (ref 1500–7800)
Neutrophils Relative %: 43 %
PLATELETS: 217 10*3/uL (ref 140–400)
RBC: 3.67 MIL/uL — AB (ref 4.20–5.80)
RDW: 13.4 % (ref 11.0–15.0)
WBC: 3.2 10*3/uL — AB (ref 3.8–10.8)

## 2017-04-01 LAB — POCT URINALYSIS DIP (DEVICE)
BILIRUBIN URINE: NEGATIVE
GLUCOSE, UA: NEGATIVE mg/dL
KETONES UR: NEGATIVE mg/dL
LEUKOCYTES UA: NEGATIVE
NITRITE: NEGATIVE
Protein, ur: NEGATIVE mg/dL
Specific Gravity, Urine: 1.02 (ref 1.005–1.030)
Urobilinogen, UA: 0.2 mg/dL (ref 0.0–1.0)
pH: 5.5 (ref 5.0–8.0)

## 2017-04-01 MED ORDER — AMLODIPINE BESYLATE 5 MG PO TABS: ORAL_TABLET | ORAL | 5 refills | Status: DC

## 2017-04-01 NOTE — Progress Notes (Signed)
Patient ID: Benz Vandenberghe, male    DOB: [birth year], 61 y.o.   MRN: [id number]  PCP: Scot Jun, FNP  Chief Complaint  Patient presents with  . Annual Exam    Subjective:  HPI Abimelec Grochowski is a 61 y.o. male with HIV, hypertension, bladder cancer,  presents for evaluation of routine wellness visit. Gaylen was last seen in office on 10/20/2016 for hypertension and hospital follow-up. He reports that his overall condition has remained stable. He has no complaints although reports that he continues to have urinary frequency due to bladder cancer. Reports an improved appetite and recently he has been able to maintain his weight over the last several months.He denies any other complaints today. Social History   Social History  . Marital status: Legally Separated    Spouse name: N/A  . Number of children: N/A  . Years of education: N/A   Occupational History  . Not on file.   Social History Main Topics  . Smoking status: Current Every Day Smoker    Packs/day: 0.10    Years: 17.00    Types: Cigarettes  . Smokeless tobacco: Never Used     Comment: 3/day; looking into vaping instead  . Alcohol use 3.6 oz/week    6 Cans of beer per week     Comment: occasional  . Drug use: No  . Sexual activity: Yes    Partners: Female    Birth control/ protection: Condom     Comment: given condoms   Other Topics Concern  . Not on file   Social History Narrative  . No narrative on file    Family History  Problem Relation Age of Onset  . Cancer Mother        Gastric Cancer  . Hypertension Mother   . Heart disease Mother   . Cancer Maternal Grandmother        breast cancer    Review of Systems  Constitutional: Negative for activity change, appetite change and unexpected weight change.  HENT: Negative.   Eyes: Negative.   Respiratory: Negative.  Negative for cough, shortness of breath and wheezing.   Cardiovascular: Negative.   Gastrointestinal: Negative.   Genitourinary: Positive for  urgency.  Skin: Negative.   Neurological: Negative for dizziness, light-headedness and headaches.  Hematological: Negative.   Psychiatric/Behavioral: Negative for agitation and self-injury. The patient is not nervous/anxious and is not hyperactive.     Patient Active Problem List   Diagnosis Date Noted  . Abdominal pain 02/19/2017  . Exacerbation of asthma 09/09/2016  . Acute respiratory failure with hypoxia ([place]) 09/09/2016  . Unintentional weight loss 07/01/2016  . Anorexia 03/11/2016  . Syncope 10/31/2015  . Dysuria 05/29/2015  . Depression 04/19/2015  . HIV disease ([place]) 04/18/2015  . 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 HTN (hypertension) 03/30/2015  . Asthma, chronic 03/30/2015  . Malignant neoplasm of bladder ([place]) 03/30/2015    Allergies  Allergen Reactions  . Aspirin Shortness Of Breath, Nausea And Vomiting and Swelling    Prior to Admission medications   Medication Sig Start Date End Date Taking? Authorizing Provider  amLODipine (NORVASC) 5 MG tablet TAKE 1.5  TABLET (7.5 MG TOTAL) BY MOUTH DAILY. 10/20/16  Yes Scot Jun, FNP  elvitegravir-cobicistat-emtricitabine-tenofovir (GENVOYA) 150-150-200-10 MG TABS tablet Take 1 tablet by mouth daily with breakfast. 01/20/17  Yes Michel Bickers, MD  megestrol (MEGACE) 400 MG/10ML suspension Take 10 mLs (400 mg total) by mouth daily. 07/01/16  Yes Michel Bickers, MD  albuterol (PROVENTIL) (2.5 MG/3ML) 0.083% nebulizer solution Take 3 mLs (2.5 mg total) by nebulization every 6 (six) hours as needed for wheezing or shortness of breath. DX BTDV:V61.607 Patient not taking: Reported on 04/01/2017 01/20/17   Michel Bickers, MD  cetirizine (ZYRTEC) 10 MG tablet Take 1 tablet (10 mg total) by mouth at bedtime. Patient not taking: Reported on 03/09/2017 10/20/16   Scot Jun, FNP  ondansetron (ZOFRAN ODT) 8 MG disintegrating tablet Take 1 tablet (8 mg total) by mouth every 8 (eight) hours as needed for nausea or  vomiting. Patient not taking: Reported on 04/01/2017 02/19/17   Michel Bickers, MD    Past Medical, Surgical Family and Social History reviewed and updated.    Objective:   Today's Vitals   04/01/17 1054  BP: (!) 144/60  Pulse: 70  Resp: 14  Temp: 98.6 F (37 C)  TempSrc: Oral  SpO2: 100%  Weight: 126 lb 9.6 oz (57.4 kg)  Height: 5\' 4"  (1.626 m)    Wt Readings from Last 3 Encounters:  04/01/17 126 lb 9.6 oz (57.4 kg)  03/09/17 124 lb (56.2 kg)  02/19/17 125 lb (56.7 kg)    Physical Exam  Constitutional: He is oriented to person, place, and time. He appears well-developed and well-nourished.  HENT:  Head: Normocephalic and atraumatic.  Eyes: Pupils are equal, round, and reactive to light. Conjunctivae and EOM are normal.  Neck: Normal range of motion. Neck supple.  Cardiovascular: Normal rate, regular rhythm, normal heart sounds and intact distal pulses.   Pulmonary/Chest: Effort normal and breath sounds normal.  Abdominal: Soft. Bowel sounds are normal.  Musculoskeletal: Normal range of motion.  Lymphadenopathy:    He has no cervical adenopathy.  Neurological: He is alert and oriented to person, place, and time.  Skin: Skin is warm and dry.  Psychiatric: He has a normal mood and affect. His behavior is normal. Judgment and thought content normal.   Assessment & Plan:  1. Physical exam, patient appears well and absent of any distress today. -Age appropriate anticipatory guidance provided -Continue high protein and complex carbohydrate foods to maintain weight  2. Urinary frequency -prediabetes 6.0 A1C. Recheck in 6 months.  3. Essential hypertension -Continue amlodipine as prescribed   Continue all follow-ups with ID and Oncology   Orders Placed This Encounter  Procedures  . CBC with Differential  . COMPLETE METABOLIC PANEL WITH GFR  . Thyroid Panel With TSH  . Hemoglobin A1c  . POCT urinalysis dip (device)    Carroll Sage. Kenton Kingfisher, MSN, FNP-C [facility]  [address] C[address] [place], [place] [address] [phone]

## 2017-04-02 LAB — THYROID PANEL WITH TSH
Free Thyroxine Index: 2 (ref 1.4–3.8)
T3 Uptake: 32 % (ref 22–35)
T4, Total: 6.2 ug/dL (ref 4.9–10.5)
TSH: 2.01 mIU/L (ref 0.40–4.50)

## 2017-04-02 LAB — COMPLETE METABOLIC PANEL WITH GFR
ALT: 20 U/L (ref 9–46)
AST: 61 U/L — AB (ref 10–35)
Albumin: 3.9 g/dL (ref 3.6–5.1)
Alkaline Phosphatase: 104 U/L (ref 40–115)
BILIRUBIN TOTAL: 0.3 mg/dL (ref 0.2–1.2)
BUN: 7 mg/dL (ref 7–25)
CHLORIDE: 102 mmol/L (ref 98–110)
CO2: 17 mmol/L — AB (ref 20–32)
CREATININE: 0.98 mg/dL (ref 0.70–1.25)
Calcium: 9.1 mg/dL (ref 8.6–10.3)
GFR, Est African American: >89 mL/min (ref >=60)
GFR, Est Non African American: 82 mL/min (ref >=60)
Glucose, Bld: 94 mg/dL (ref 65–99)
Potassium: 4.9 mmol/L (ref 3.5–5.3)
Sodium: 131 mmol/L — ABNORMAL LOW (ref 135–146)
TOTAL PROTEIN: 7.9 g/dL (ref 6.1–8.1)

## 2017-04-02 LAB — HEMOGLOBIN A1C
Hgb A1c MFr Bld: 6 % — ABNORMAL HIGH (ref <5.7)
Mean Plasma Glucose: 126 mg/dL

## 2017-04-03 MED FILL — GENVOYA TABLET: 150-150-200 | 30 days supply | Qty: 30 | Fill #3

## 2017-04-29 ENCOUNTER — Telehealth: Payer: Self-pay | Admitting: *Deleted

## 2017-04-29 NOTE — Telephone Encounter (Signed)
Spoke with [name]. He says everything has been going well and he can have a home visit tomorrow to review medications regimen, vitals and upcoming appts

## 2017-04-30 ENCOUNTER — Ambulatory Visit: Payer: Self-pay | Admitting: *Deleted

## 2017-04-30 DIAGNOSIS — B20 Human immunodeficiency virus [HIV] disease: Secondary | ICD-10-CM

## 2017-05-01 ENCOUNTER — Telehealth: Payer: Self-pay | Admitting: *Deleted

## 2017-05-01 MED FILL — AMLODIPINE BESYLATE 5 MG TA: 5 | 30 days supply | Qty: 30 | Fill #4

## 2017-05-01 MED FILL — GENVOYA TABLET: 150-150-200 | 30 days supply | Qty: 30 | Fill #4

## 2017-05-01 NOTE — Telephone Encounter (Signed)
To early to arrange [facility] transportation during yesterday's visit since [facility] only accepts transportation request 7 days in advance. Contacted [facility] today and arranged transportation for [name]'s oct 5th appt with Dr Louis Meckel. Patient will need to be ready between 11 and 11:30 for pick up with a return at 3 to 3:30.

## 2017-05-04 ENCOUNTER — Ambulatory Visit: Payer: Self-pay | Admitting: *Deleted

## 2017-05-04 DIAGNOSIS — B20 Human immunodeficiency virus [HIV] disease: Secondary | ICD-10-CM

## 2017-05-04 DIAGNOSIS — Z550 Illiteracy and low-level literacy: Secondary | ICD-10-CM

## 2017-05-18 NOTE — Progress Notes (Signed)
Rn made a home visit with [name] who states he is struggling to take his medications due to nausea. After a long conversation [name] admitted that his nausea is because he lacks food in his home and it taking his medication on a empty stomach. RN traveled and was able to get the patient some grocery for the week. Patient gets his monthly check on the 3rd of the month but was unable to afford food until then.  RN traveled back the the home and delivered the food. [name] assured me that since he has food he will take his medications

## 2017-05-21 ENCOUNTER — Ambulatory Visit: Payer: Self-pay | Admitting: *Deleted

## 2017-05-21 VITALS — BP 136/68 | HR 84

## 2017-05-21 DIAGNOSIS — Z550 Illiteracy and low-level literacy: Secondary | ICD-10-CM

## 2017-05-21 DIAGNOSIS — B20 Human immunodeficiency virus [HIV] disease: Secondary | ICD-10-CM

## 2017-05-21 NOTE — Progress Notes (Signed)
Rn made a home visit with [name] who is very upset about a denial letter received from [employer] stating his application for housing has been denied due to an unpaid balance(773.80) with another housing authority. [name] stated this information is not true and his keeping him in an apartment that he may not be able to heat during the cold weather. RN read the denial letter and agreed to write the appeal.  Appeal letter stated:  [place] [address] [place] [facility] [place] [address]  Dear [employer]/Ms. Ferman Hamming I have received a letter stating my application for housing has been denied due to unpaid rent/charges owed to another housing authority in the amount of 773.80. I am requesting an informal review of these allegations. I have not been made aware by phone or mail of any lingering unpaid rent or charges. I do feel that the allegations stated are by mistake and I should be eligible for public housing at this time.   [name]   During this visit [name] and I discussed his medications adherence.  [name] stated he has not been taking his medications daily but almost every day and expects to be undetectable. RN reminded Rollan of his upcoming Lab and MD appts

## 2017-05-25 ENCOUNTER — Ambulatory Visit: Payer: Self-pay | Admitting: *Deleted

## 2017-05-25 ENCOUNTER — Encounter: Payer: Self-pay | Admitting: *Deleted

## 2017-05-25 DIAGNOSIS — Z550 Illiteracy and low-level literacy: Secondary | ICD-10-CM

## 2017-05-26 ENCOUNTER — Other Ambulatory Visit: Payer: Medicare Other

## 2017-05-26 DIAGNOSIS — B20 Human immunodeficiency virus [HIV] disease: Secondary | ICD-10-CM

## 2017-05-27 LAB — T-HELPER CELL (CD4) - (RCID CLINIC ONLY)
CD4 % Helper T Cell: 40 % (ref 33–55)
CD4 T Cell Abs: 620 /uL (ref 400–2700)

## 2017-05-28 ENCOUNTER — Other Ambulatory Visit: Payer: Self-pay | Admitting: Internal Medicine

## 2017-05-28 DIAGNOSIS — B20 Human immunodeficiency virus [HIV] disease: Secondary | ICD-10-CM

## 2017-06-02 MED FILL — GENVOYA TABLET: 150-150-200 | 30 days supply | Qty: 30 | Fill #5

## 2017-06-02 NOTE — Progress Notes (Signed)
RN made a home visit with the patient and he states he has not had any more episodes of blood in his urine and states things have been improving for him. We contacted the housing authority and they confirm that the patient is still on the list for housing and will be contacted by mail or phone. Representative stated the waiting list is currently closed and they are trying to house the current people on the waiting list. I do not feel that [name] is adhering to his medications at this time

## 2017-06-08 DIAGNOSIS — C675 Malignant neoplasm of bladder neck: Secondary | ICD-10-CM | POA: Diagnosis not present

## 2017-06-08 DIAGNOSIS — C678 Malignant neoplasm of overlapping sites of bladder: Secondary | ICD-10-CM | POA: Diagnosis not present

## 2017-06-09 ENCOUNTER — Ambulatory Visit (INDEPENDENT_AMBULATORY_CARE_PROVIDER_SITE_OTHER): Payer: Medicare Other | Admitting: Internal Medicine

## 2017-06-09 ENCOUNTER — Encounter: Payer: Self-pay | Admitting: Internal Medicine

## 2017-06-09 ENCOUNTER — Ambulatory Visit (INDEPENDENT_AMBULATORY_CARE_PROVIDER_SITE_OTHER): Payer: Medicare Other | Admitting: Licensed Clinical Social Worker

## 2017-06-09 DIAGNOSIS — F330 Major depressive disorder, recurrent, mild: Secondary | ICD-10-CM

## 2017-06-09 DIAGNOSIS — R634 Abnormal weight loss: Secondary | ICD-10-CM | POA: Diagnosis not present

## 2017-06-09 DIAGNOSIS — B20 Human immunodeficiency virus [HIV] disease: Secondary | ICD-10-CM | POA: Diagnosis not present

## 2017-06-09 DIAGNOSIS — F4323 Adjustment disorder with mixed anxiety and depressed mood: Secondary | ICD-10-CM

## 2017-06-09 LAB — HIV-1 RNA QUANT-NO REFLEX-BLD
HIV 1 RNA Quant: 2170 copies/mL — ABNORMAL HIGH
HIV-1 RNA Quant, Log: 3.34 Log copies/mL — ABNORMAL HIGH

## 2017-06-09 LAB — HIV-1 GENOTYPE: HIV-1 GENOTYPE: DETECTED — AB

## 2017-06-09 NOTE — Progress Notes (Signed)
Patient Active Problem List   Diagnosis Date Noted  . HIV disease ([place]) 04/18/2015    Priority: High  . Dysuria 05/29/2015    Priority: Medium  . Depression 04/19/2015    Priority: Medium  . 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 Malignant neoplasm of bladder ([place]) 03/30/2015    Priority: Medium  . Abdominal pain 02/19/2017  . Exacerbation of asthma 09/09/2016  . Acute respiratory failure with hypoxia ([place]) 09/09/2016  . Unintentional weight loss 07/01/2016  . Anorexia 03/11/2016  . Syncope 10/31/2015  . HTN (hypertension) 03/30/2015  . Asthma, chronic 03/30/2015      Medication List        Accurate as of 06/09/17 10:21 AM. Always use your most recent med list.          albuterol (2.5 MG/3ML) 0.083% nebulizer solution Commonly known as:  PROVENTIL Take 3 mLs (2.5 mg total) by nebulization every 6 (six) hours as needed for wheezing or shortness of breath. DX code:J45.901   amLODipine 5 MG tablet Commonly known as:  NORVASC TAKE 1.5  TABLET (7.5 MG TOTAL) BY MOUTH DAILY.   cetirizine 10 MG tablet Commonly known as:  ZYRTEC Take 1 tablet (10 mg total) by mouth at bedtime.   elvitegravir-cobicistat-emtricitabine-tenofovir 150-150-200-10 MG Tabs tablet Commonly known as:  GENVOYA Take 1 tablet by mouth daily with breakfast.   megestrol 400 MG/10ML suspension Commonly known as:  MEGACE Take 10 mLs (400 mg total) by mouth daily.   ondansetron 8 MG disintegrating tablet Commonly known as:  ZOFRAN ODT Take 1 tablet (8 mg total) by mouth every 8 (eight) hours as needed for nausea or vomiting.       Subjective: [name] is in for his routine HIV follow-up visit. He has had no problems obtaining or tolerating his Genvoya.Based on notes from [employer] nurse, it appears that he has had problems with adherence. He also tells me that he recently went to [place] to visit family.He forgot to take his  medication with him.  He saw his urologist yesterday and underwent cystoscopy. He was told that there was a spot that could be cancer. He underwent a biopsy and plans on hearing the report in the next few days. He was told that he may need to restart chemotherapy.  He says that he is under a great deal of stress with his cancer. He says that sometimes he feels like he does not want to continue taking medication. The only other medicine he takes on a regular basis other than Genvoya is his blood pressure medication, amlodipine.  Review of Systems: Review of Systems  Constitutional: Negative for chills, diaphoresis, fever, malaise/fatigue and weight loss.  HENT: Negative for sore throat.   Respiratory: Negative for cough, sputum production and shortness of breath.   Cardiovascular: Negative for chest pain.  Gastrointestinal: Positive for abdominal pain and nausea. Negative for diarrhea, heartburn and vomiting.  Genitourinary: Negative for dysuria and frequency.  Musculoskeletal: Negative for joint pain and myalgias.  Skin: Negative for rash.  Neurological: Negative for dizziness and headaches.  Psychiatric/Behavioral: Positive for depression. Negative for substance abuse and suicidal ideas. The patient is not nervous/anxious.     Past Medical History:  Diagnosis Date  . Asthma with COPD ([place])   . Bladder cancer (H[facility])    and prostatic urethra cancer  HX TURBT'S IN [place], [place]  WITH INSTILLATION CHEMO TX'S  .  Bladder tumor   . History of transfusion   . HIV disease ([place])    under care of [facility]  . Hypertension   . Nocturia     Social History   Tobacco Use  . Smoking status: Current Every Day Smoker    Packs/day: 0.10    Years: 17.00    Pack years: 1.70    Types: Cigarettes  . Smokeless tobacco: Never Used  . Tobacco comment: 3/day; looking into vaping instead  Substance Use Topics  . Alcohol use: Yes    Alcohol/week: 3.6 oz    Types: 6  Cans of beer per week    Comment: occasional  . Drug use: No    Family History  Problem Relation Age of Onset  . Cancer Mother        Gastric Cancer  . Hypertension Mother   . Heart disease Mother   . Cancer Maternal Grandmother        breast cancer     Allergies  Allergen Reactions  . Aspirin Shortness Of Breath, Nausea And Vomiting and Swelling    Health Maintenance  Topic Date Due  . COLONOSCOPY  10/05/2003  . INFLUENZA VACCINE  03/04/2017  . TETANUS/TDAP  04/02/2027 (Originally 10/04/1974)  . Hepatitis C Screening  Completed  . HIV Screening  Completed    Objective:  Vitals:   06/09/17 0934  BP: (!) 173/70  Pulse: 69  Temp: 98 F (36.7 C)  TempSrc: Oral  Weight: 131 lb (59.4 kg)  Height: 5\' 4"  (1.626 m)   Body mass index is 22.49 kg/m.  Physical Exam  Constitutional: He is oriented to person, place, and time.  He is quiet and reserved as usual but in good spirits. He has gained 5 pounds since his last visit.  HENT:  Mouth/Throat: No oropharyngeal exudate.  Eyes: Conjunctivae are normal.  Cardiovascular: Normal rate and regular rhythm.  No murmur heard. Pulmonary/Chest: Effort normal and breath sounds normal.  Abdominal: Soft. He exhibits no mass. There is no tenderness.  Musculoskeletal: Normal range of motion.  Neurological: He is alert and oriented to person, place, and time. Gait normal.  Skin: No rash noted.  Psychiatric: Mood and affect normal.    Lab Results Lab Results  Component Value Date   WBC 3.2 (L) 04/01/2017   HGB 12.3 (L) 04/01/2017   HCT 36.1 (L) 04/01/2017   MCV 98.4 04/01/2017   PLT 217 04/01/2017    Lab Results  Component Value Date   CREATININE 0.98 04/01/2017   BUN 7 04/01/2017   NA 131 (L) 04/01/2017   K 4.9 04/01/2017   CL 102 04/01/2017   CO2 17 (L) 04/01/2017    Lab Results  Component Value Date   ALT 20 04/01/2017   AST 61 (H) 04/01/2017   ALKPHOS 104 04/01/2017   BILITOT 0.3 04/01/2017    Lab Results    Component Value Date   CHOL 129 10/17/2015   HDL 92 10/17/2015   LDLCALC 28 10/17/2015   TRIG 43 10/17/2015   CHOLHDL 1.4 10/17/2015   Lab Results  Component Value Date   LABRPR NON REAC 10/17/2015   HIV 1 RNA Quant (copies/mL)  Date Value  05/26/2017 2,170 (H)  02/09/2017 161 (H)  09/30/2016 <20 DETECTED (A)   CD4 T Cell Abs (/uL)  Date Value  05/26/2017 620  02/09/2017 720  09/30/2016 740     Problem List Items Addressed This Visit      High  HIV disease ([place])    He continues to have problems with consistent adherence to his Genvoya. I talked to him again, as did our pharmacist, Onnie Boer, about the risk of emerging resistance if he continues to miss doses. He says that he has not missed any doses since he returned from [place]. I will have him return for blood work in 6 weeks        Medium   Depression    He continues to suffer from mild chronic depression. I had him meet with our counselor Sande Rives, today.        Unprioritized   Unintentional weight loss    I am not entirely sure what caused his recent unintentional weight loss and abdominal pain with nausea but he is now regaining his lost weight.           Michel Bickers, MD [facility] for [facility] [phone] pager   [phone] cell 06/09/2017, 10:21 AM

## 2017-06-09 NOTE — Assessment & Plan Note (Signed)
He continues to suffer from mild chronic depression. I had him meet with our counselor Sande Rives, today.

## 2017-06-09 NOTE — BH Specialist Note (Signed)
Integrated Behavioral Health Initial Visit  MRN: [id number] Name: [name]  Number of [place] Clinician visits:: 1/6 Session Start time: 10:17 am  Session End time: 10:45 am Total time: 30 minutes  Type of Service: [place] Interpretor:No. Interpretor Name and Language: N/A   Warm Hand Off Completed.       SUBJECTIVE: [name] is a 61 y.o. male accompanied by self Patient was referred by Dr. Megan Salon for medication noncompliance.  Patient reports the following symptoms/concerns: "I'm tired of taking medications".  Patient reported that he is tired of taking his HIV medications daily and that he doesn't feel like himself anymore.  Patient denied suicidal ideations or plan but reported that he has given up on life and doesn't feel like he has much to live for due to a new medical diagnosis of possible cancer.  Patient was able to process reasons to live and able to reason that he should live and enjoy life while he is alive. Patient also processed things that make him happy, such as visiting with his grandchildren.  Patient was receptive to measures on how to increase life satisfaction and quality of life.  Patient was able to speak about his family and supportive family members and reported that his brother is able to handle and support his medical issues.  Patient reported that he is going to utilize his brother for support.  Duration of problem: past few months; Severity of problem: moderate  OBJECTIVE: Mood: Anxious and Depressed and Affect: Appropriate Risk of harm to self or others: No plan to harm self or others  LIFE CONTEXT: Family and Social: Patient has two daughters and grandchildren located in [place] and has multiple siblings. School/Work: Patient does not work. Self-Care: Patient is able to tend to his ADLs but has struggled with medication compliance. Life Changes: Patient has new medical illness that  may be cancer  ASSESSMENT: Patient is currently experiencing depression and anxiety and may benefit from behavioral health services to develop coping strategies and medication management.  GOALS ADDRESSED: Patient will: 1. Reduce symptoms of: anxiety and depression 2. Increase knowledge and/or ability of: coping skills, healthy habits and stress reduction  3. Demonstrate ability to: Increase healthy adjustment to current life circumstances, Increase adequate support systems for patient/family, Increase motivation to adhere to plan of care and Improve medication compliance  INTERVENTIONS: Interventions utilized: Motivational Interviewing   PLAN: 1. [facility] encouraged patient to arrange follow-up appointment however the patient was not receptive.  [facility] provided patient with business cards and patient will call back for additional assistance if needed.   Sande Rives, [facility]

## 2017-06-09 NOTE — Progress Notes (Signed)
HPI: [name] is a 61 y.o. male who is here to f/u with Dr. Megan Salon for his HIV.   Allergies: Allergies  Allergen Reactions  . Aspirin Shortness Of Breath, Nausea And Vomiting and Swelling    Vitals: Temp: 98 F (36.7 C) (11/06 0934) Temp Source: Oral (11/06 0934) BP: 173/70 (11/06 0934) Pulse Rate: 69 (11/06 0934)  Past Medical History: Past Medical History:  Diagnosis Date  . Asthma with COPD ([place])   . Bladder cancer (HCC)    and prostatic urethra cancer  HX TURBT'S IN [place], [place]  WITH INSTILLATION CHEMO TX'S  . Bladder tumor   . History of transfusion   . HIV disease ([place])    under care of [facility]  . Hypertension   . Nocturia     Social History: Social History   Socioeconomic History  . Marital status: Legally Separated    Spouse name: None  . Number of children: None  . Years of education: None  . Highest education level: None  Social Needs  . Financial resource strain: None  . Food insecurity - worry: None  . Food insecurity - inability: None  . Transportation needs - medical: None  . Transportation needs - non-medical: None  Occupational History  . None  Tobacco Use  . Smoking status: Current Every Day Smoker    Packs/day: 0.10    Years: 17.00    Pack years: 1.70    Types: Cigarettes  . Smokeless tobacco: Never Used  . Tobacco comment: 3/day; looking into vaping instead  Substance and Sexual Activity  . Alcohol use: Yes    Alcohol/week: 3.6 oz    Types: 6 Cans of beer per week    Comment: occasional  . Drug use: No  . Sexual activity: Yes    Partners: Female    Birth control/protection: Condom    Comment: given condoms  Other Topics Concern  . None  Social History Narrative  . None    Previous Regimen:   Current Regimen: Genvoya  Labs: HIV 1 RNA Quant (copies/mL)  Date Value  05/26/2017 2,170 (H)  02/09/2017 161 (H)  09/30/2016 <20 DETECTED (A)   CD4 T Cell Abs (/uL)  Date Value   05/26/2017 620  02/09/2017 720  09/30/2016 740   Hep B S Ab (no units)  Date Value  03/20/2015 NEG   Hepatitis B Surface Ag (no units)  Date Value  03/20/2015 NEGATIVE   HCV Ab (no units)  Date Value  03/20/2015 NEGATIVE    CrCl: CrCl cannot be calculated (Patient's most recent lab result is older than the maximum 21 days allowed.).  Lipids:    Component Value Date/Time   CHOL 129 10/17/2015 1058   TRIG 43 10/17/2015 1058   HDL 92 10/17/2015 1058   CHOLHDL 1.4 10/17/2015 1058   VLDL 9 10/17/2015 1058   LDLCALC 28 10/17/2015 1058    Assessment: [name] VL recently bumped up when he was traveling to [facility] and forgot his meds here. He stated that he cut his trip short to return home for the meds. He hasn't missed any doses since then. Counseled on being adherence again and the risk for resistance. He said that he won't miss more doses.   Recommendations:  Continue Genvoya daily F/u in 6 wks  Onnie Boer, PharmD, BCPS, AAHIVP, CPP Clinical Infectious [place] for [facility] 06/09/2017, 4:08 PM

## 2017-06-09 NOTE — Assessment & Plan Note (Signed)
He continues to have problems with consistent adherence to his Genvoya. I talked to him again, as did our pharmacist, Onnie Boer, about the risk of emerging resistance if he continues to miss doses. He says that he has not missed any doses since he returned from [place]. I will have him return for blood work in 6 weeks

## 2017-06-09 NOTE — Assessment & Plan Note (Signed)
I am not entirely sure what caused his recent unintentional weight loss and abdominal pain with nausea but he is now regaining his lost weight.

## 2017-06-11 ENCOUNTER — Ambulatory Visit: Payer: Self-pay | Admitting: *Deleted

## 2017-06-11 VITALS — BP 136/78

## 2017-06-11 DIAGNOSIS — B20 Human immunodeficiency virus [HIV] disease: Secondary | ICD-10-CM

## 2017-06-12 ENCOUNTER — Telehealth: Payer: Self-pay | Admitting: *Deleted

## 2017-06-12 NOTE — Telephone Encounter (Signed)
Spoke with [name] and he thought he had a lab appt prior to his visit with [name]. Checked Allen. [place] appts and he only has a 2 month appt with [name]. Made Allen Lalla aware.

## 2017-06-16 ENCOUNTER — Other Ambulatory Visit: Payer: Self-pay | Admitting: Pharmacist

## 2017-06-22 DIAGNOSIS — C678 Malignant neoplasm of overlapping sites of bladder: Secondary | ICD-10-CM | POA: Diagnosis not present

## 2017-06-22 DIAGNOSIS — Z5111 Encounter for antineoplastic chemotherapy: Secondary | ICD-10-CM | POA: Diagnosis not present

## 2017-06-24 MED FILL — AMLODIPINE BESYLATE 5 MG TA: 5 | 30 days supply | Qty: 45 | Fill #0

## 2017-06-26 MED FILL — GENVOYA TABLET: 150-150-200 | 30 days supply | Qty: 30 | Fill #6

## 2017-06-29 ENCOUNTER — Encounter: Payer: Self-pay | Admitting: *Deleted

## 2017-06-29 DIAGNOSIS — Z5111 Encounter for antineoplastic chemotherapy: Secondary | ICD-10-CM | POA: Diagnosis not present

## 2017-06-29 DIAGNOSIS — C678 Malignant neoplasm of overlapping sites of bladder: Secondary | ICD-10-CM | POA: Diagnosis not present

## 2017-06-29 NOTE — Telephone Encounter (Signed)
Individualized Plan Of Care, Certification Period of 03/30/2017 to 06/28/2017 a. Type of service(s) and care to be delivered: [facility] Nurse   Frequency and duration of service: Effective: 03/30/17:  36mo3,1mo1, 3PRN's for complications with disease process/progression, medication changes or concerns b. Activity restrictions: Pt may be up as tolerated and can safely ambulate without the need for a assistive device. Pt wears glasses c. Safety Measures: Standard Precautions/Infection Control d. Service Objectives and Goals:Service Objectives are to assist the pt with HIV medication regimen adherence and staying in care with the Infectious Disease Clinic by identifying barriers to care. RN will address the barriers that are identified by the patient.   Patient continues to have unmet goals with adherence questionable. Pt identified a goal of getting over his Bladder Cancer and would like to be in a relationship with a male who is also positive. pt does not feel comfortable disclosing his status. Current RN goal for this patient is to offer a lot of emotional support and encouragement.He continues to struggle with eating regularly as well. RN would like to accompany the patient to his Clinic appts, with his permission, to ensure he has a understanding of information provided.  e. Equipment required: No additional equipment needs at this time f. Functional Limitations: Vision. Pt has corrective glasses that he wears g. Rehabilitation potential: Guarded h. Diet and Nutritional Needs: Regular Diet i. Medications and treatments: Medications have been reconciled and reviewed and are a part of EPIC electronic file j. Specific therapies if needed: RN k. Pertinent diagnoses: Hypertension, Asthma, Bladder Cancer, Depression l. Expected outcome: Guarded

## 2017-06-29 NOTE — Telephone Encounter (Signed)
I agree with this plan of care 

## 2017-07-02 ENCOUNTER — Other Ambulatory Visit: Payer: Medicare Other | Admitting: *Deleted

## 2017-07-03 ENCOUNTER — Ambulatory Visit: Payer: Self-pay | Admitting: *Deleted

## 2017-07-03 ENCOUNTER — Telehealth: Payer: Self-pay | Admitting: *Deleted

## 2017-07-03 DIAGNOSIS — B20 Human immunodeficiency virus [HIV] disease: Secondary | ICD-10-CM

## 2017-07-06 DIAGNOSIS — Z5111 Encounter for antineoplastic chemotherapy: Secondary | ICD-10-CM | POA: Diagnosis not present

## 2017-07-06 DIAGNOSIS — C678 Malignant neoplasm of overlapping sites of bladder: Secondary | ICD-10-CM | POA: Diagnosis not present

## 2017-07-14 NOTE — Progress Notes (Signed)
At this time RN traveled to [employer] and provided written documentation requesting an appeal for [name] housing denial. Letter was stamped as received and taken at this time. RN contacted [name] and made him aware that the documentation has been submitted within the allotted 10 day time frame

## 2017-07-15 ENCOUNTER — Other Ambulatory Visit: Payer: Medicare Other

## 2017-07-15 NOTE — Progress Notes (Signed)
RN received a call from the patient yesterday stating he really would like to have a visit. Visit performed today and the patient and I spoke about his trouble with adherence. RN actively listened and offered education on ways to recognize depression and ways to treat it.Options of speaking with the counselor and [name] was made but the patient declined at this time. Patient showed me that he has began to fill his own pillbox and is feeling positive thoughts about his progression.

## 2017-07-20 ENCOUNTER — Other Ambulatory Visit: Payer: Self-pay | Admitting: Internal Medicine

## 2017-07-20 DIAGNOSIS — B20 Human immunodeficiency virus [HIV] disease: Secondary | ICD-10-CM

## 2017-07-22 MED FILL — AMLODIPINE BESYLATE 5 MG TA: 5 | 30 days supply | Qty: 45 | Fill #1

## 2017-07-22 MED FILL — GENVOYA TABLET: 150-150-200 | 30 days supply | Qty: 30 | Fill #0

## 2017-07-29 ENCOUNTER — Other Ambulatory Visit: Payer: Medicare Other

## 2017-07-29 DIAGNOSIS — B20 Human immunodeficiency virus [HIV] disease: Secondary | ICD-10-CM | POA: Diagnosis not present

## 2017-07-30 LAB — T-HELPER CELL (CD4) - (RCID CLINIC ONLY)
CD4 T CELL HELPER: 40 % (ref 33–55)
CD4 T Cell Abs: 620 /uL (ref 400–2700)

## 2017-07-30 NOTE — Progress Notes (Signed)
Prior to visit RN completed the housing form for [name] along with printing a copy of his SSI Benefit summary. Traveled to [place]'s home and took the information. Focus is to ensure [name] has adequate and affordable housing

## 2017-08-02 NOTE — Progress Notes (Signed)
Home Visit made today and Gid and I had a long conversation about his lack of mediations compliance. Tron stated he is back on his medication and has been refilling his pillbox. Kielan showed me that he has been refilling his pillbox and wants me to trust that he is ready to be undetectable again. Rn encouraged [place] to please understand the seriousness of medication adherence in relation to his HIV

## 2017-08-05 ENCOUNTER — Telehealth: Payer: Self-pay | Admitting: Pharmacist Clinician (PhC)/ Clinical Pharmacy Specialist

## 2017-08-05 NOTE — Telephone Encounter (Signed)
Anh VL has crept up now due to adherence. Will reach out to [name] to see if we can bring her back to change her ART>>Symtuza/DTG

## 2017-08-06 ENCOUNTER — Telehealth: Payer: Self-pay | Admitting: *Deleted

## 2017-08-06 NOTE — Telephone Encounter (Addendum)
Contacted [name] today and explained to him in that his viral load did not go down like we expected it to. Explained to him that 10 months ago the medications was controlling his virus but then 2 months ago the virus started to show itself again. [name] stated he felt that the virus would not show up in his most recent lab work(8 days ago). Explained to [name] that we were hoping for the same thing and to protect him we may need to chane his medications. [name] stated "ok" but sounded hesistant and then stated "so what you are saying is that the virus is showing in blood". I stated yes and offered [name] condoms but he stated he has plenty.  [name] and I have a long relationship and so I stated "[name], you better be honest with me about taking your medications". [name] chuckled and stated he forgot to take his medications last night so he took them 5am this morning and can remember forgetting a couple other days as well. Explained to [name] that I care and we(clinic) want what is best for him. Explained to him that we want to protect him and may have to change his medications to be sure that the medications will continue to work. Explained to [name] that he has to be honest with me, [name] and the pharmacist. [name] stated he understood and will be honest about how and if he is taking his medications during his next pharmacy visit. Scheduled a pharmacy visit for the 7th.   Contacted [name] and asked him to come on Wednesday at 11 instead of Monday at 105. [name] stated that would be fine. We confirmed my visit with him for next Tuesday

## 2017-08-07 NOTE — Telephone Encounter (Signed)
VO received from Dr. Megan Salon on 07/03/17 approving today's visit and the evaluation  Individualized [place], Certification Period of 07/03/17 to 10/01/17 a. Type of service(s) and care to be delivered: [facility] Nurse   Frequency and duration of service: Effective: 07/03/17:  ,58mo3, 3PRN's for complications with disease process/progression, medication changes or concerns b. Activity restrictions: Pt may be up as tolerated and can safely ambulate without the need for a assistive device. Pt wears glasses c. Safety Measures: Standard Precautions/Infection Control d. Service Objectives and Goals:Service Objectives are to assist the pt with HIV medication regimen adherence and staying in care with the Infectious Disease Clinic by identifying barriers to care. RN will address the barriers that are identified by the patient.   Patient continues to have unmet goals with adherence questionable. Pt identified a goal of getting over his Bladder Cancer and would like to be in a relationship with a male who is also positive. pt does not feel comfortable disclosing his status. Current RN goal for this patient is to offer a lot of emotional support and encouragement.He continues to struggle with eating regularly as well. RN would like to accompany the patient to his Clinic appts, with his permission, to ensure he has a understanding of information provided.  e. Equipment required: No additional equipment needs at this time f. Functional Limitations: Vision. Pt has corrective glasses that he wears g. Rehabilitation potential: Guarded h. Diet and Nutritional Needs: Regular Diet i. Medications and treatments: Medications have been reconciled and reviewed and are a part of EPIC electronic file j. Specific therapies if needed: RN k. Pertinent diagnoses: Hypertension, Asthma, Bladder Cancer with Chemo treatment, Depression l. Expected outcome: Guarded

## 2017-08-07 NOTE — Telephone Encounter (Signed)
I approve of this plan of care. 

## 2017-08-10 ENCOUNTER — Ambulatory Visit: Payer: Medicare Other

## 2017-08-11 ENCOUNTER — Ambulatory Visit: Payer: Self-pay | Admitting: *Deleted

## 2017-08-11 ENCOUNTER — Telehealth: Payer: Self-pay | Admitting: *Deleted

## 2017-08-11 VITALS — BP 146/68 | HR 70 | Temp 97.5°F

## 2017-08-11 DIAGNOSIS — B20 Human immunodeficiency virus [HIV] disease: Secondary | ICD-10-CM

## 2017-08-11 DIAGNOSIS — Z550 Illiteracy and low-level literacy: Secondary | ICD-10-CM

## 2017-08-11 DIAGNOSIS — Z9114 Patient's other noncompliance with medication regimen: Secondary | ICD-10-CM

## 2017-08-11 NOTE — Telephone Encounter (Signed)
Spoke with [name] and he confirmed that he will be home and ready for our visit today. Advised him that I am on the way

## 2017-08-12 ENCOUNTER — Ambulatory Visit (INDEPENDENT_AMBULATORY_CARE_PROVIDER_SITE_OTHER): Payer: Medicare Other | Admitting: Pharmacist

## 2017-08-12 DIAGNOSIS — Z91199 Patient's noncompliance with other medical treatment and regimen due to unspecified reason: Secondary | ICD-10-CM

## 2017-08-12 DIAGNOSIS — B20 Human immunodeficiency virus [HIV] disease: Secondary | ICD-10-CM

## 2017-08-12 DIAGNOSIS — R112 Nausea with vomiting, unspecified: Secondary | ICD-10-CM | POA: Diagnosis not present

## 2017-08-12 DIAGNOSIS — Z9119 Patient's noncompliance with other medical treatment and regimen: Secondary | ICD-10-CM | POA: Diagnosis not present

## 2017-08-12 MED ORDER — ONDANSETRON 8 MG PO TBDP: 8.0000 mg | ORAL_TABLET | Freq: Every day | ORAL | 5 refills | Status: DC

## 2017-08-12 MED ORDER — DARUN-COBIC-EMTRICIT-TENOFAF 800-150-200-10 MG PO TABS: 1.0000 | ORAL_TABLET | Freq: Every day | ORAL | 5 refills | Status: DC

## 2017-08-12 MED FILL — SYMTUZA 800-150-200-10 MG T: 800-150-200 | 30 days supply | Qty: 30 | Fill #0

## 2017-08-12 MED FILL — ONDANSETRON ODT 8 MG TABLET: 8 | 30 days supply | Qty: 30 | Fill #0

## 2017-08-12 NOTE — Progress Notes (Signed)
Home visit with Fritz Pickerel today and he stated "[name] I really don't want them to change my medications because the problem is not the medication it's me". Nyquan stated the problem is that he is not taking the medications and he has been on a 2 pill twice a day regimen before and it was harder to manage.Mujahid stated he just gets tired of taking pills. Explained to San that the concern is that he will become resistant to the medications. Miliano stated he does not want anyone to think that I am not trying to help him or that the medication does not work it's just that he forgets to take the medications at times He stated [name] has talked to him several times about his medication adherence. Deangleo also stated he has been considering moving in with a friend to save money. His current apartment is affordable but the light bill is over 200 dollars a month and this leaves him without anymore money for the month.  We reviewed his upcoming appts, contacted Judie Petit on housing concerns, called [employer] about electrical bill and called [facility] to confirm next appt date and time. The current plan is to have [employer] come out to expect his home's electrical system since over 200 dollars a month is excessive for a small apartment, go to [employer] to f/u on submitted LIEAP application(we completed this in November 2018 and submitted it on Nov 28th), have [employer] speak with apartment Freight forwarder about electrical concerns, and [employer] has agreed to [place] if he needs to go to [employer]. I advised Tavaughn to consider holding off on giving up his apartment to see if we can get her light bill affordable. If the LIEAP pays on his current light bill he will only owe 135 that can be broken up into 2 payments. Then we can put him on a equal payment plan through [employer]. Planned to see Nevaeh again during the week of Jan 21st to Jan 25th.

## 2017-08-12 NOTE — Progress Notes (Signed)
[place] for Infectious Disease Pharmacy Visit  HPI: [name] is a 62 y.o. male who presents to the [facility] for HIV follow-up.   Patient Active Problem List   Diagnosis Date Noted  . Abdominal pain 02/19/2017  . Exacerbation of asthma 09/09/2016  . Acute respiratory failure with hypoxia ([place]) 09/09/2016  . Unintentional weight loss 07/01/2016  . Anorexia 03/11/2016  . Syncope 10/31/2015  . Dysuria 05/29/2015  . Depression 04/19/2015  . HIV disease ([place]) 04/18/2015  . 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 HTN (hypertension) 03/30/2015  . Asthma, chronic 03/30/2015  . Malignant neoplasm of bladder ([place]) 03/30/2015    Patient's Medications  New Prescriptions   DARUNAVIR-COBICISCTAT-EMTRICITABINE-TENOFOVIR ALAFENAMIDE (SYMTUZA) 800-150-200-10 MG TABS    Take 1 tablet by mouth daily with breakfast.  Previous Medications   ALBUTEROL (PROVENTIL) (2.5 MG/3ML) 0.083% NEBULIZER SOLUTION    Take 3 mLs (2.5 mg total) by nebulization every 6 (six) hours as needed for wheezing or shortness of breath. DX code:J45.901   AMLODIPINE (NORVASC) 5 MG TABLET    TAKE 1.5  TABLET (7.5 MG TOTAL) BY MOUTH DAILY.   CETIRIZINE (ZYRTEC) 10 MG TABLET    Take 1 tablet (10 mg total) by mouth at bedtime.   MEGESTROL (MEGACE) 400 MG/10ML SUSPENSION    Take 10 mLs (400 mg total) by mouth daily.  Modified Medications   Modified Medication Previous Medication   ONDANSETRON (ZOFRAN ODT) 8 MG DISINTEGRATING TABLET ondansetron (ZOFRAN ODT) 8 MG disintegrating tablet      Take 1 tablet (8 mg total) by mouth daily. Take after you eat and then 30 minutes later take your Symtuza.    Take 1 tablet (8 mg total) by mouth every 8 (eight) hours as needed for nausea or vomiting.  Discontinued Medications   GENVOYA 150-150-200-10 MG TABS TABLET    TAKE 1 TABLET BY MOUTH DAILY WITH BREAKFAST.    Allergies: Allergies  Allergen Reactions  . Aspirin Shortness Of Breath, Nausea And Vomiting and  Swelling    Past Medical History: Past Medical History:  Diagnosis Date  . Asthma with COPD ([place])   . Bladder cancer (H[facility])    and prostatic urethra cancer  HX TURBT'S IN [place]  WITH INSTILLATION CHEMO TX'S  . Bladder tumor   . History of transfusion   . HIV disease ([place])    under care of [facility]  . Hypertension   . Nocturia     Social History: Social History   Socioeconomic History  . Marital status: Legally Separated    Spouse name: Not on file  . Number of children: Not on file  . Years of education: Not on file  . Highest education level: Not on file  Social Needs  . Financial resource strain: Not on file  . Food insecurity - worry: Not on file  . Food insecurity - inability: Not on file  . Transportation needs - medical: Not on file  . Transportation needs - non-medical: Not on file  Occupational History  . Not on file  Tobacco Use  . Smoking status: Current Every Day Smoker    Packs/day: 0.10    Years: 17.00    Pack years: 1.70    Types: Cigarettes  . Smokeless tobacco: Never Used  . Tobacco comment: 3/day; looking into vaping instead  Substance and Sexual Activity  . Alcohol use: Yes    Alcohol/week: 3.6 oz    Types: 6 Cans of beer per  week    Comment: occasional  . Drug use: No  . Sexual activity: Yes    Partners: Female    Birth control/protection: Condom    Comment: given condoms  Other Topics Concern  . Not on file  Social History Narrative  . Not on file    Labs: HIV 1 RNA Quant (copies/mL)  Date Value  07/29/2017 1,200 (H)  05/26/2017 2,170 (H)  02/09/2017 161 (H)   CD4 T Cell Abs (/uL)  Date Value  07/29/2017 620  05/26/2017 620  02/09/2017 720   Hep B S Ab (no units)  Date Value  03/20/2015 NEG   Hepatitis B Surface Ag (no units)  Date Value  03/20/2015 NEGATIVE   HCV Ab (no units)  Date Value  03/20/2015 NEGATIVE    Lipids:    Component Value Date/Time   CHOL 129 10/17/2015  1058   TRIG 43 10/17/2015 1058   HDL 92 10/17/2015 1058   CHOLHDL 1.4 10/17/2015 1058   VLDL 9 10/17/2015 1058   LDLCALC 28 10/17/2015 1058    Current HIV Regimen: Genvoya  Assessment: [name] is here today to see me for his HIV infection.  He has been on Genvoya for several years.  His recent labs show that his viral load is up again as it has been the last 2-3 times we have checked.  The last time he was undetectable was February 2018. Once we saw his labs, we decided to get him back in to see [place] to discuss compliance and changing medications.  [name] has issues with food and his stomach.  He is often nauseated and says that the medications makes him sick sometimes. When he first came to the appointment, he said that he didn't think we needed to change his HIV medications because he is choosing not to take them at times due to his nausea. I told him that it wasn't that simple and that if he is missing multiple doses per week or even per month then it is likely he could have developed resistance to the components of Genvoya and that medication won't work for him anymore.  He does admit to missing 1-2 doses per week some weeks and some weeks taking them every day.  On average, probably 4-5 doses per month.    I explained to him the danger of taking his medications like this and how it could be very bad with Genvoya.  He has some Zofran that he takes some times when his stomach is nauseated for which he says helps him and works. I wanted to change him to [place] but he is very hesitant about changing at all and does not wish to take another pill at this time.  His integrase genotype is back and shows no resistance but his regular genotype is still in process.  I will change him to [place] alone today with hopes he hasn't developed a M184 mutation or another mutation that causes emtricitabine or tenofovir not to work.  I told him there was a chance we would have to add another medication in the  future and he understood.   He states he never takes his medications without food, so we decided that he would eat a meal and then immediately after the meal, he would take a Zofran 8 mg tablet.  Then 30-45 minutes later, he would take his Symtuza so that he would have some food on his stomach and so that the Zofran could start working. I told  him to always take the [place] with food on his stomach and kept explaining the importance of not missing doses anymore.  He does not wish to be on multiple pills per day so he said he will try harder.  He dose have a pill box and he tells me he fills it up every Sunday for the week and keeps it by his bed so that he sees it every day.  I told him to make sure he kept doing that.  I will have him come back in ~1 month for labs and then Dr. Megan Salon ~2 weeks later.  [employer] is also working very close with him and sees him almost weekly. I told him to call with any issues. He gets his meds from [facility].   Plan: - Stop Genvoya - Eat a meal then immediately take a Zofran 8 mg tablet - 30-45 minutes later take your Symtuza - F/u for labs 2/12 at 1030am - F/u with Dr. Megan Salon 2/26 at [facility]. [name], PharmD, [place], [place] for Infectious Disease 08/13/2017, 11:34 AM

## 2017-08-13 LAB — HIV-1 INTEGRASE GENOTYPE

## 2017-08-13 LAB — HIV RNA, RTPCR W/R GT (RTI, PI,INT)
HIV 1 RNA Quant: 1200 copies/mL — ABNORMAL HIGH
HIV-1 RNA QUANT, LOG: 3.08 {Log_copies}/mL — AB

## 2017-08-13 LAB — HIV-1 GENOTYPE: HIV-1 Genotype: DETECTED — AB

## 2017-08-20 ENCOUNTER — Ambulatory Visit: Payer: Medicare Other | Admitting: Internal Medicine

## 2017-08-26 ENCOUNTER — Ambulatory Visit: Payer: Self-pay | Admitting: *Deleted

## 2017-08-26 DIAGNOSIS — B20 Human immunodeficiency virus [HIV] disease: Secondary | ICD-10-CM

## 2017-08-26 DIAGNOSIS — Z9114 Patient's other noncompliance with medication regimen: Secondary | ICD-10-CM

## 2017-08-31 DIAGNOSIS — N3000 Acute cystitis without hematuria: Secondary | ICD-10-CM | POA: Diagnosis not present

## 2017-08-31 DIAGNOSIS — C678 Malignant neoplasm of overlapping sites of bladder: Secondary | ICD-10-CM | POA: Diagnosis not present

## 2017-09-04 ENCOUNTER — Other Ambulatory Visit: Payer: Self-pay | Admitting: Pharmacist

## 2017-09-04 DIAGNOSIS — J45909 Unspecified asthma, uncomplicated: Secondary | ICD-10-CM

## 2017-09-04 MED ORDER — ALBUTEROL SULFATE HFA 108 (90 BASE) MCG/ACT IN AERS: 2.0000 | INHALATION_SPRAY | Freq: Four times a day (QID) | RESPIRATORY_TRACT | 11 refills | Status: DC | PRN

## 2017-09-04 MED FILL — ALBUTEROL SULFATE HFA 108 (: 108 (90 BAS | 25 days supply | Qty: 18 | Fill #0

## 2017-09-07 MED FILL — SYMTUZA 800-150-200-10 MG T: 800-150-200 | 30 days supply | Qty: 30 | Fill #1

## 2017-09-07 MED FILL — AMLODIPINE BESYLATE 5 MG TA: 5 | 30 days supply | Qty: 45 | Fill #2

## 2017-09-07 MED FILL — ONDANSETRON ODT 8 MG TABLET: 8 | 30 days supply | Qty: 30 | Fill #1

## 2017-09-15 ENCOUNTER — Other Ambulatory Visit: Payer: Medicare Other

## 2017-09-29 ENCOUNTER — Ambulatory Visit: Payer: Medicare Other | Admitting: Internal Medicine

## 2017-09-29 MED FILL — VENTOLIN HFA 90 MCG INHALER: 108 (90 BAS | 25 days supply | Qty: 18 | Fill #1

## 2017-10-01 ENCOUNTER — Telehealth: Payer: Self-pay | Admitting: Pharmacist

## 2017-10-01 DIAGNOSIS — I10 Essential (primary) hypertension: Secondary | ICD-10-CM

## 2017-10-01 MED ORDER — AMLODIPINE BESYLATE 5 MG PO TABS: 5.0000 mg | ORAL_TABLET | Freq: Every day | ORAL | 5 refills | Status: DC

## 2017-10-01 NOTE — Telephone Encounter (Signed)
[name] missed his appointment with Dr. Megan Salon this week and did not do labs a few weeks ago.  I called him to see how he is doing since switching to [place] last month.  He states he is doing very well and has only missed 1 dose in the last 2 months.  He takes a Zofran before he takes his Symtuza every day and states that he no longer has an upset stomach or stomach issues.  He is out of town for his birthday and will call me next Tuesday to reschedule his lab and Dr. Megan Salon appointments.

## 2017-10-01 NOTE — Progress Notes (Signed)
Home Visit made today and Pearce and I had a long conversation about his lack of mediation compliance. Bradin stated he is back on his medication and has been refilling his pillbox. Traveion showed me that he has been refilling his pillbox and wants me to trust that he is ready to be undetectable again. Rn encouraged Brett to please understand the seriousness of medication adherence in relation to his HIV

## 2017-10-02 ENCOUNTER — Ambulatory Visit: Payer: Medicare Other | Admitting: Family Medicine

## 2017-10-05 MED FILL — ONDANSETRON ODT 8 MG TABLET: 8 | 30 days supply | Qty: 30 | Fill #2

## 2017-10-05 MED FILL — AMLODIPINE BESYLATE 5 MG TA: 5 | 30 days supply | Qty: 30 | Fill #0

## 2017-10-05 MED FILL — SYMTUZA 800-150-200-10 MG T: 800-150-200 | 30 days supply | Qty: 30 | Fill #2

## 2017-10-07 ENCOUNTER — Ambulatory Visit: Payer: Self-pay | Admitting: *Deleted

## 2017-10-07 DIAGNOSIS — Z9114 Patient's other noncompliance with medication regimen: Secondary | ICD-10-CM

## 2017-10-07 DIAGNOSIS — B20 Human immunodeficiency virus [HIV] disease: Secondary | ICD-10-CM

## 2017-10-07 DIAGNOSIS — I10 Essential (primary) hypertension: Secondary | ICD-10-CM

## 2017-10-12 ENCOUNTER — Ambulatory Visit: Payer: Self-pay | Admitting: *Deleted

## 2017-10-12 ENCOUNTER — Telehealth: Payer: Self-pay | Admitting: *Deleted

## 2017-10-12 VITALS — BP 162/58 | HR 78 | Temp 97.8°F

## 2017-10-12 DIAGNOSIS — Z550 Illiteracy and low-level literacy: Secondary | ICD-10-CM

## 2017-10-12 DIAGNOSIS — B20 Human immunodeficiency virus [HIV] disease: Secondary | ICD-10-CM

## 2017-10-12 DIAGNOSIS — I10 Essential (primary) hypertension: Secondary | ICD-10-CM

## 2017-10-12 DIAGNOSIS — Z9114 Patient's other noncompliance with medication regimen: Secondary | ICD-10-CM

## 2017-10-12 NOTE — Telephone Encounter (Signed)
PATIENT ON HOLD  Plan of Care orders have expired effective 10/02/17 but GOALS have not been completely meet at this time. I would like to connect with the patient and with possible further orders from MD. Currently Mr [name] is out of town. If I am unable to get in contact with her within the next 30 days I will have to discharge at that time. RN WILL NOT RESUME CARE UNTIL NEW MD ORDERS OBTAINED AND PATIENT ABLE/WILLING TO RE-ENGAGE IN CARE

## 2017-10-12 NOTE — Progress Notes (Signed)
Home visit made today with Fritz Pickerel. Oren Section DNP was present for the home visit as well for my supervisory visit. During this visit [place] discussed his feeling of being tired of being sick. He expressed that he is tired of taking medications as well and sometimes feels hopeless. He declined counselor services and at this time Sharyn Lull and I offered active listening and motivation interviewing techniques such as offering open ended question and active listening. He denies any suicidal or homicidal ideations. After meeting for a while Ritik stated he does not plan to give up and does not want to die he just feels tired and overwhelmed when trying to battle cancer and HIV. I explained to Hardeep that I can definitely understand and look forward to helping him in anyway I can. He admits to missing a couple days of medications. He recently was unable to get down the stairs of his home due to lower extremity weakness and we discussed the safety risk and fall hazards related to that. Alize stated he has spoken with the property managers at the apartment complex and they will not allow him to move to a downstairs apartment.  I will place a [facility] referral for assistance with social and housing concerns. Missed office visits have been rescheduled and placed on the patient's appointment calendar.    VO received from Dr. Megan Salon on 10/07/17 approving today's visit and the evaluation  Individualized [place], Certification Period of  to 10/12/16 to 01/10/2018 a. Type of service(s) and care to be delivered: [facility] Nurse   Frequency and duration of service: Effective: 10/12/17 71mo3, 3PRN's for complications with disease process/progression, medication changes or concerns b. Activity restrictions: Pt may be up as tolerated and can safely ambulate without the need for a assistive device. Pt wears glasses c. Safety Measures: Standard Precautions/Infection Control d. Service Objectives and  Goals:Service Objectives are to assist the pt with HIV medication regimen adherence and staying in care with the Infectious Disease Clinic by identifying barriers to care. RN will address the barriers that are identified by the patient.   Patient continues to have unmet goals with adherence questionable. He continues to struggle with eating regularly and consistent adherence to medications. Patient centered goal is to stay in care, manage his HIV and have affordable housing. My goal is to bridge Mr Risdon with [facility] Case Management and a possible discharge from my services.   e. Equipment required: No additional equipment needs at this time f. Functional Limitations: Vision. Pt has corrective glasses that he wears g. Rehabilitation potential: Guarded h. Diet and Nutritional Needs: Regular Diet i. Medications and treatments: Medications have been reconciled and reviewed and are a part of EPIC electronic file j. Specific therapies if needed: RN k. Pertinent diagnoses: Hypertension, Asthma, Bladder Cancer with Chemo treatment, Depression l. Expected outcome: Guarded

## 2017-10-12 NOTE — Progress Notes (Signed)
Arrived at [facility] home for our visit but he could not make it today. Contacted [place] and he was at the bus stop waiting on the bus to arrive. We rescheduled his visit for next week.

## 2017-10-12 NOTE — Telephone Encounter (Signed)
Contacted [name] and confirmed out visit for today at 12:00. [name] confirmed

## 2017-10-13 NOTE — Telephone Encounter (Signed)
I saw him on yesterday and he is really down in the dumps. He admitted to missing some doses of his medications and I hope he will stay on track this time. I rescheduled his lab and MD appts

## 2017-10-15 NOTE — Telephone Encounter (Signed)
Encounter not needed

## 2017-10-23 ENCOUNTER — Other Ambulatory Visit: Payer: Medicare Other

## 2017-10-26 ENCOUNTER — Telehealth: Payer: Self-pay | Admitting: *Deleted

## 2017-10-26 NOTE — Telephone Encounter (Signed)
HOLD removed effective 10/12/17 VO received from Dr. Megan Salon on 10/07/17 approving today's visit and the evaluation  Individualized [place], Certification Period of  to 10/12/16 to 01/10/2018 a. Type of service(s) and care to be delivered: [facility] Nurse   Frequency and duration of service: Effective: 10/12/17 37mo3, 3PRN's for complications with disease process/progression, medication changes or concerns b. Activity restrictions: Pt may be up as tolerated and can safely ambulate without the need for a assistive device. Pt wears glasses c. Safety Measures: Standard Precautions/Infection Control d. Service Objectives and Goals:Service Objectives are to assist the pt with HIV medication regimen adherence and staying in care with the Infectious Disease Clinic by identifying barriers to care. RN will address the barriers that are identified by the patient.   Patient continues to have unmet goals with adherence questionable. He continues to struggle with eating regularly and consistent adherence to medications. Patient centered goal is to stay in care, manage his HIV and have affordable housing. My goal is to bridge Mr Alviar with [facility] Case Management and a possible discharge from my services.   e. Equipment required: No additional equipment needs at this time f. Functional Limitations: Vision. Pt has corrective glasses that he wears g. Rehabilitation potential: Guarded h. Diet and Nutritional Needs: Regular Diet i. Medications and treatments: Medications have been reconciled and reviewed and are a part of EPIC electronic file j. Specific therapies if needed: RN k. Pertinent diagnoses: Hypertension, Asthma, Bladder Cancer with Chemo treatment, Depression l. Expected outcome: Guarded

## 2017-10-27 ENCOUNTER — Telehealth: Payer: Self-pay | Admitting: *Deleted

## 2017-10-27 NOTE — Telephone Encounter (Signed)
Contacted patient's pharmacy and the last refill was on March 5th. Next refill due for the end of this week.  Current Regimen: Symtuza  Pharmacy/Last refill date: [facility] with delivery set up/ last refill was on march 5th and [name] states he expects his delivery for this Friday.  Patient call made: Spoke with [name] today and he states all is well he just has a slight cold. He questioned when his next lab appt was and I informed him that he missed his lab appt scheduled for the 22nd. He stated I was suppose to reschedule that and I reminded him that I have not seen or talked to him since the 22nd and we updated his calendar with his appt dates at our last visit.  He corrected himself and stated yeah you are right.  Appointment made or reminder given: Yes, lab appt rescheduled for the 4th. I arranged a home visit for Friday at 10:30  Do you have any needs or concerns at this time? No additional needs or concerns expressed

## 2017-10-27 NOTE — Telephone Encounter (Signed)
I approve of this plan of care. 

## 2017-10-29 MED FILL — ONDANSETRON ODT 8 MG TABLET: 8 | 30 days supply | Qty: 30 | Fill #3

## 2017-10-29 MED FILL — AMLODIPINE BESYLATE 5 MG TA: 5 | 30 days supply | Qty: 30 | Fill #1

## 2017-10-29 MED FILL — SYMTUZA 800-150-200-10 MG T: 800-150-200 | 30 days supply | Qty: 30 | Fill #3

## 2017-10-29 MED FILL — VENTOLIN HFA 90 MCG INHALER: 108 (90 BAS | 25 days supply | Qty: 18 | Fill #2

## 2017-10-30 ENCOUNTER — Ambulatory Visit: Payer: Self-pay | Admitting: *Deleted

## 2017-10-30 DIAGNOSIS — I10 Essential (primary) hypertension: Secondary | ICD-10-CM

## 2017-10-30 DIAGNOSIS — Z9114 Patient's other noncompliance with medication regimen: Secondary | ICD-10-CM

## 2017-10-30 DIAGNOSIS — Z550 Illiteracy and low-level literacy: Secondary | ICD-10-CM

## 2017-11-05 ENCOUNTER — Other Ambulatory Visit: Payer: Medicare Other

## 2017-11-05 DIAGNOSIS — B20 Human immunodeficiency virus [HIV] disease: Secondary | ICD-10-CM | POA: Diagnosis not present

## 2017-11-07 LAB — HIV-1 RNA QUANT-NO REFLEX-BLD
HIV 1 RNA Quant: 38 copies/mL — ABNORMAL HIGH
HIV-1 RNA Quant, Log: 1.58 Log copies/mL — ABNORMAL HIGH

## 2017-11-10 ENCOUNTER — Ambulatory Visit: Payer: Medicare Other | Admitting: Internal Medicine

## 2017-11-12 NOTE — Progress Notes (Signed)
Home visit made today and Rony states he has been concerned because he did not receive his food stamp benefits on the 19th and he no longer has any food in his home. He also does not have any money until the 3rd of April. Together we contacted Orthoptist and the representative stated Mr Scholer received food stamp benefits on the 20th of January, no benefits for Feb, and then March benefits were given out on March 3rd. Mr Castell will not received his next benefits until April 19th. Mr Ridley cannot focus on taking his medications when he is concerned about not having any food to eat for the next 5 days. I traveled to the closest grocery store and purchased perishable items and then traveled to [employer] and gathered non-perishable items for Mr Packard. Food was then taken to him home. Mr Lamson stated he wanted to pay me back and I asked that he please take his medications daily and that will be payback for the grocery. Mr Deeg verbalized understanding.

## 2017-11-16 ENCOUNTER — Encounter: Payer: Self-pay | Admitting: *Deleted

## 2017-11-18 ENCOUNTER — Ambulatory Visit: Payer: Self-pay | Admitting: *Deleted

## 2017-11-18 DIAGNOSIS — Z550 Illiteracy and low-level literacy: Secondary | ICD-10-CM

## 2017-11-18 DIAGNOSIS — F329 Major depressive disorder, single episode, unspecified: Secondary | ICD-10-CM

## 2017-11-18 DIAGNOSIS — I10 Essential (primary) hypertension: Secondary | ICD-10-CM

## 2017-11-18 DIAGNOSIS — F32A Depression, unspecified: Secondary | ICD-10-CM

## 2017-11-18 DIAGNOSIS — B20 Human immunodeficiency virus [HIV] disease: Secondary | ICD-10-CM

## 2017-11-19 ENCOUNTER — Ambulatory Visit (INDEPENDENT_AMBULATORY_CARE_PROVIDER_SITE_OTHER): Payer: Medicare Other | Admitting: Internal Medicine

## 2017-11-19 ENCOUNTER — Encounter: Payer: Self-pay | Admitting: Internal Medicine

## 2017-11-19 DIAGNOSIS — F3340 Major depressive disorder, recurrent, in remission, unspecified: Secondary | ICD-10-CM

## 2017-11-19 DIAGNOSIS — B20 Human immunodeficiency virus [HIV] disease: Secondary | ICD-10-CM

## 2017-11-19 DIAGNOSIS — C674 Malignant neoplasm of posterior wall of bladder: Secondary | ICD-10-CM

## 2017-11-19 NOTE — Progress Notes (Signed)
Patient Active Problem List   Diagnosis Date Noted  . HIV disease ([place]) 04/18/2015    Priority: High  . Dysuria 05/29/2015    Priority: Medium  . Depression 04/19/2015    Priority: Medium  . 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 Malignant neoplasm of bladder ([place]) 03/30/2015    Priority: Medium  . Abdominal pain 02/19/2017  . Exacerbation of asthma 09/09/2016  . Acute respiratory failure with hypoxia ([place]) 09/09/2016  . Unintentional weight loss 07/01/2016  . Anorexia 03/11/2016  . Syncope 10/31/2015  . HTN (hypertension) 03/30/2015  . Asthma, chronic 03/30/2015    Patient's Medications  New Prescriptions   No medications on file  Previous Medications   ALBUTEROL (PROVENTIL HFA;VENTOLIN HFA) 108 (90 BASE) MCG/ACT INHALER    Inhale 2 puffs into the lungs every 6 (six) hours as needed for wheezing or shortness of breath.   AMLODIPINE (NORVASC) 5 MG TABLET    Take 1 tablet (5 mg total) by mouth daily.   CETIRIZINE (ZYRTEC) 10 MG TABLET    Take 1 tablet (10 mg total) by mouth at bedtime.   DARUNAVIR-COBICISCTAT-EMTRICITABINE-TENOFOVIR ALAFENAMIDE (SYMTUZA) 800-150-200-10 MG TABS    Take 1 tablet by mouth daily with breakfast.   MEGESTROL (MEGACE) 400 MG/10ML SUSPENSION    Take 10 mLs (400 mg total) by mouth daily.   ONDANSETRON (ZOFRAN ODT) 8 MG DISINTEGRATING TABLET    Take 1 tablet (8 mg total) by mouth daily. Take after you eat and then 30 minutes later take your Symtuza.  Modified Medications   No medications on file  Discontinued Medications   No medications on file    Subjective: [name] is in for his routine HIV follow-up visit.  He was recently switched to [employer].  He takes it each evening.  He always takes ondansetron about 30 minutes before taking it and has had no problems with nausea or vomiting.  He missed 3 days in a row when he was late picking it up from the pharmacy but otherwise has had no missed doses.  He thinks he  had pneumonia last month.  He thought about going to the emergency department.  He did not know that he could call here and be seen.  He has not followed up with his urologist in several months.  His food stamps have been cut out he feels like he has had enough to eat recently.  Review of Systems: Review of Systems  Constitutional: Positive for weight loss. Negative for chills, diaphoresis, fever and malaise/fatigue.  HENT: Negative for sore throat.   Respiratory: Negative for cough, sputum production and shortness of breath.   Cardiovascular: Negative for chest pain.  Gastrointestinal: Negative for abdominal pain, diarrhea, heartburn, nausea and vomiting.  Genitourinary: Negative for dysuria.  Musculoskeletal: Negative for joint pain and myalgias.  Skin: Negative for rash.  Neurological: Negative for dizziness and headaches.  Psychiatric/Behavioral: Negative for depression.    Past Medical History:  Diagnosis Date  . Asthma with COPD ([place])   . Bladder cancer ([facility])    and prostatic urethra cancer  HX TURBT'S IN [place], [place]  WITH INSTILLATION CHEMO TX'S  . Bladder tumor   . History of transfusion   . HIV disease ([place])    under care of [facility]  . Hypertension   . Nocturia     Social History   Tobacco Use  . Smoking status: Current Every  Day Smoker    Packs/day: 0.10    Years: 17.00    Pack years: 1.70    Types: Cigarettes  . Smokeless tobacco: Never Used  . Tobacco comment: 3/day; looking into vaping instead  Substance Use Topics  . Alcohol use: Yes    Alcohol/week: 3.6 oz    Types: 6 Cans of beer per week    Comment: occasional  . Drug use: No    Family History  Problem Relation Age of Onset  . Cancer Mother        Gastric Cancer  . Hypertension Mother   . Heart disease Mother   . Cancer Maternal Grandmother        breast cancer     Allergies  Allergen Reactions  . Aspirin Shortness Of Breath, Nausea And Vomiting and  Swelling    Health Maintenance  Topic Date Due  . COLONOSCOPY  10/05/2003  . TETANUS/TDAP  04/02/2027 (Originally 10/04/1974)  . INFLUENZA VACCINE  03/04/2018  . Hepatitis C Screening  Completed  . HIV Screening  Completed    Objective:  Vitals:   11/19/17 1205  BP: (!) 160/72  Pulse: 70  Temp: (!) 97.4 F (36.3 C)  TempSrc: Oral  Weight: 128 lb (58.1 kg)  Height: 5\' 4"  (1.626 m)   Body mass index is 21.97 kg/m.  Physical Exam  Constitutional: He is oriented to person, place, and time.  He is calm and pleasant as usual.  He has lost a few pounds.  HENT:  Mouth/Throat: No oropharyngeal exudate.  Eyes: Conjunctivae are normal.  Cardiovascular: Normal rate and regular rhythm.  No murmur heard. Pulmonary/Chest: Effort normal and breath sounds normal. He has no wheezes. He has no rales.  Abdominal: Soft. He exhibits no mass. There is no tenderness.  Musculoskeletal: Normal range of motion.  Neurological: He is alert and oriented to person, place, and time.  Skin: No rash noted.  Psychiatric: He has a normal mood and affect.    Lab Results Lab Results  Component Value Date   WBC 3.2 (L) 04/01/2017   HGB 12.3 (L) 04/01/2017   HCT 36.1 (L) 04/01/2017   MCV 98.4 04/01/2017   PLT 217 04/01/2017    Lab Results  Component Value Date   CREATININE 0.98 04/01/2017   BUN 7 04/01/2017   NA 131 (L) 04/01/2017   K 4.9 04/01/2017   CL 102 04/01/2017   CO2 17 (L) 04/01/2017    Lab Results  Component Value Date   ALT 20 04/01/2017   AST 61 (H) 04/01/2017   ALKPHOS 104 04/01/2017   BILITOT 0.3 04/01/2017    Lab Results  Component Value Date   CHOL 129 10/17/2015   HDL 92 10/17/2015   LDLCALC 28 10/17/2015   TRIG 43 10/17/2015   CHOLHDL 1.4 10/17/2015   Lab Results  Component Value Date   LABRPR NON REAC 10/17/2015   HIV 1 RNA Quant (copies/mL)  Date Value  11/05/2017 38 (H)  07/29/2017 1,200 (H)  05/26/2017 2,170 (H)   CD4 T Cell Abs (/uL)  Date Value    07/29/2017 620  05/26/2017 620  02/09/2017 720     Problem List Items Addressed This Visit      High   HIV disease ([place])    His infection has come under good control since starting Symtuza.  He will follow-up in 3 months.        Medium   Depression    His depression is in remission.  Malignant neoplasm of bladder ([place])    I encouraged him to call Dr. Carlton Adam office today and schedule a follow-up appointment.           Allen Bickers, MD [facility] for [facility] [place] [facility] [phone] pager   [phone] cell 11/19/2017, 12:24 PM

## 2017-11-19 NOTE — Assessment & Plan Note (Signed)
His depression is in remission. 

## 2017-11-19 NOTE — Assessment & Plan Note (Signed)
His infection has come under good control since starting Symtuza.  He will follow-up in 3 months.

## 2017-11-19 NOTE — Assessment & Plan Note (Signed)
I encouraged him to call Dr. Carlton Adam office today and schedule a follow-up appointment.

## 2017-11-23 MED FILL — AMLODIPINE BESYLATE 5 MG TA: 5 | 30 days supply | Qty: 30 | Fill #2

## 2017-11-23 MED FILL — ONDANSETRON ODT 8 MG TABLET: 8 | 30 days supply | Qty: 30 | Fill #4

## 2017-11-23 MED FILL — VENTOLIN HFA 90 MCG INHALER: 108 (90 BAS | 25 days supply | Qty: 18 | Fill #3

## 2017-11-23 MED FILL — SYMTUZA 800-150-200-10 MG T: 800-150-200 | 30 days supply | Qty: 30 | Fill #4

## 2017-12-04 NOTE — Progress Notes (Signed)
Home visit made with [name] today and his concern is his inability to afford his rent and the elevated light bill. [name] would like to be considered for a senior living apartment complex. Together we went through the application even though they currently have a waiting list. [name] states he has not missed any days with his medications and states he is just concerned about his housing.

## 2017-12-09 ENCOUNTER — Ambulatory Visit: Payer: Self-pay | Admitting: *Deleted

## 2017-12-09 DIAGNOSIS — B20 Human immunodeficiency virus [HIV] disease: Secondary | ICD-10-CM

## 2017-12-17 MED FILL — AMLODIPINE BESYLATE 5 MG TA: 5 | 30 days supply | Qty: 30 | Fill #3

## 2017-12-17 MED FILL — SYMTUZA 800-150-200-10 MG T: 800-150-200 | 30 days supply | Qty: 30 | Fill #5

## 2017-12-17 MED FILL — VENTOLIN HFA 90 MCG INHALER: 108 (90 BAS | 25 days supply | Qty: 18 | Fill #4

## 2017-12-25 NOTE — Progress Notes (Addendum)
Contacted the patient and he stated he has an appt with the apartment manager at 2:30pm to complete his application for a lower level apartment. [name] stated he needs some assistance with completing the application. [name] cannot read or write but does recognize numbers. Relocating to a lower level apartment is vital because the patient cannot safely ambulate down the stairs and out of his apartment when he does NOT feel well. I met the patient for his appointment today and together we completed the housing application for a lower level apartment. At this time I showed Allen. [name] where his new apartment will be.

## 2017-12-31 ENCOUNTER — Ambulatory Visit: Payer: Self-pay | Admitting: *Deleted

## 2017-12-31 VITALS — BP 112/78 | HR 70 | Temp 98.0°F

## 2017-12-31 DIAGNOSIS — B20 Human immunodeficiency virus [HIV] disease: Secondary | ICD-10-CM

## 2017-12-31 DIAGNOSIS — Z9114 Patient's other noncompliance with medication regimen: Secondary | ICD-10-CM

## 2017-12-31 NOTE — Progress Notes (Signed)
Home visit with [name] today. Physical assessment is unremarkable. [name] has been feeling stronger and continues to th improve. His appetite has improved and states he continues to adhere to his medications. We discussed his upcoming appt date and time along with his planned moved to a lower level apartment within the complex. During this visit [name] and reviewed his lab work from April along with the results of the Genotype. [name] stated he understands but becomes overwhelmed at times with his medical concerns. Counseling has been offered and currently he declines stating he is doing pretty good right now.

## 2018-01-08 ENCOUNTER — Telehealth: Payer: Self-pay | Admitting: *Deleted

## 2018-01-08 NOTE — Telephone Encounter (Signed)
I agree with this plan of care 

## 2018-01-08 NOTE — Telephone Encounter (Signed)
Current Orders Effective 01/11/18  Individualized Plan Of Care, Certification Period of  to 01/11/18 to 04/11/2018 a. Type of service(s) and care to be delivered: [facility] Nurse   Frequency and duration of service: Effective: 01/11/18 69mo3, 3PRN's for complications with disease process/progression, medication changes or concerns b. Activity restrictions: Pt may be up as tolerated and can safely ambulate without the need for a assistive device. Pt wears glasses c. Safety Measures: Standard Precautions/Infection Control d. Service Objectives and Goals:Service Objectives are to assist the pt with HIV medication regimen adherence and staying in care with the Infectious Disease Clinic by identifying barriers to care. RN will address the barriers that are identified by the patient.   Patient continues to have unmet goals with adherence questionable. He continues to struggle with eating regularly and consistent adherence to medications. Patient centered goal is to stay in care, manage his HIV and have affordable housing. My goal is to bridge Mr Piet with [facility] Case Management and a possible discharge from my services.   e. Equipment required: No additional equipment needs at this time f. Functional Limitations: Vision. Pt has corrective glasses that he wears g. Rehabilitation potential: Guarded h. Diet and Nutritional Needs: Regular Diet i. Medications and treatments: Medications have been reconciled and reviewed and are a part of EPIC electronic file j. Specific therapies if needed: RN k. Pertinent diagnoses: Hypertension, Asthma, Bladder Cancer with Chemo treatment, Depression l. Expected outcome: Guarded

## 2018-01-11 ENCOUNTER — Other Ambulatory Visit: Payer: Self-pay | Admitting: Internal Medicine

## 2018-01-11 DIAGNOSIS — B20 Human immunodeficiency virus [HIV] disease: Secondary | ICD-10-CM

## 2018-01-11 IMAGING — CT CT ANGIO CHEST
2 of 9 series · 19 of 46 positions shown · IV contrast (isovue)
Comparison: None.

CLINICAL DATA: Shortness of breath for 4 days. Cough. History of
asthma.

EXAM:
CT ANGIOGRAPHY CHEST WITH CONTRAST
TECHNIQUE: Multidetector CT imaging of the chest was performed using the
standard protocol during bolus administration of intravenous
contrast. Multiplanar CT image reconstructions and MIPs were
obtained to evaluate the vascular anatomy.
CONTRAST:  100 mL Isovue 370

[Series 5: thins · axial · 0.63mm/px · z∈[+1100,+1393]mm · 16 of 331 slices shown]
[im 19/331  lung]
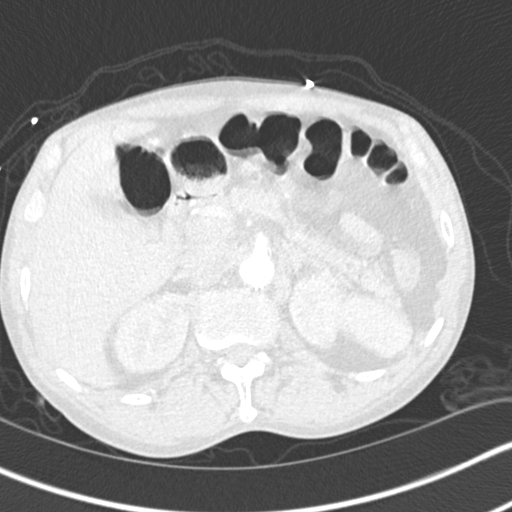
[im 37/331  soft-tissue]
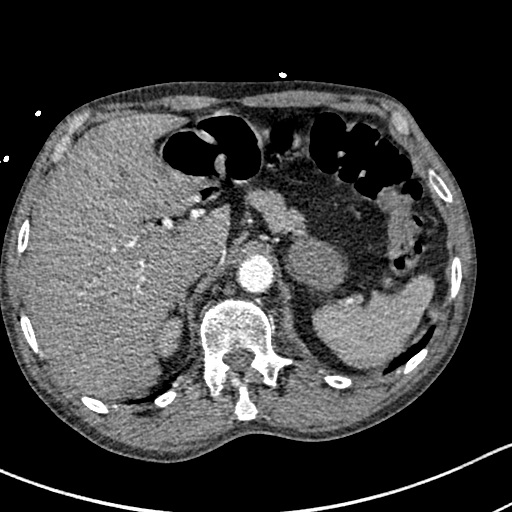
[im 56/331  lung]
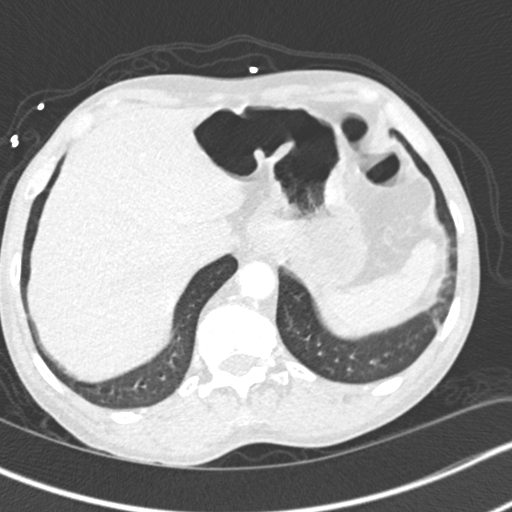
[im 74/331  soft-tissue]
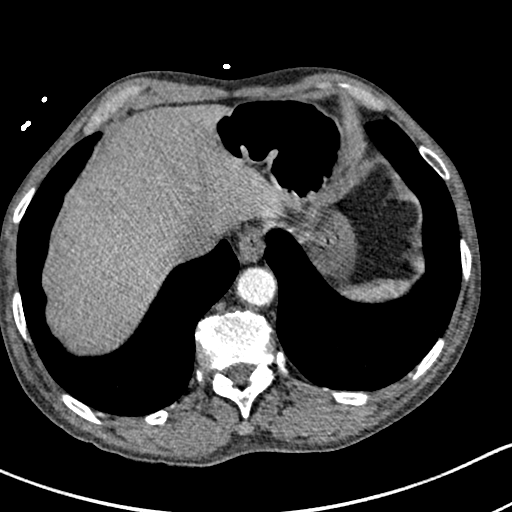
[im 92/331  lung]
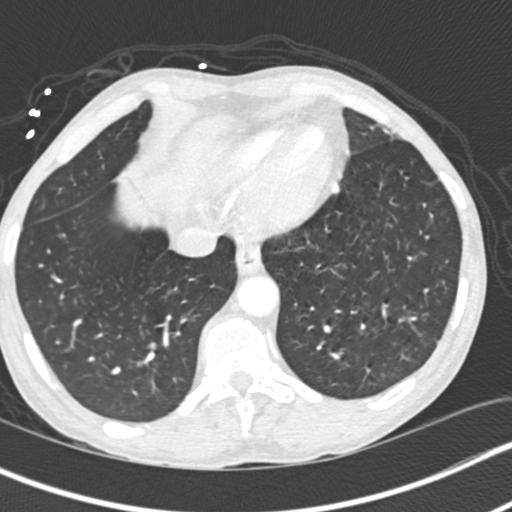
[im 111/331  soft-tissue]
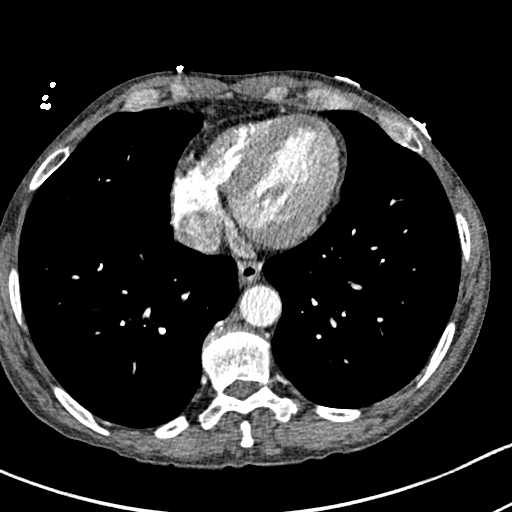
[im 129/331  lung]
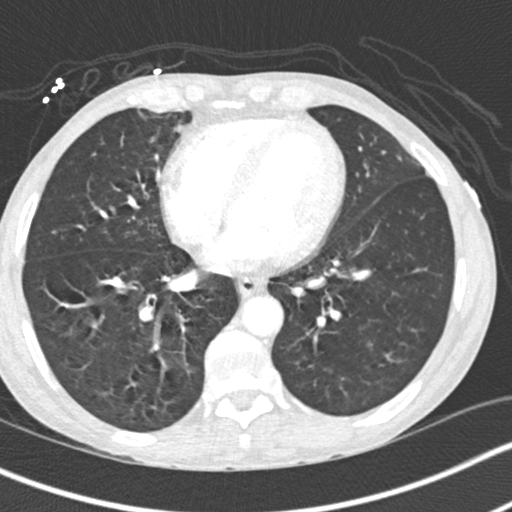
[im 147/331  soft-tissue]
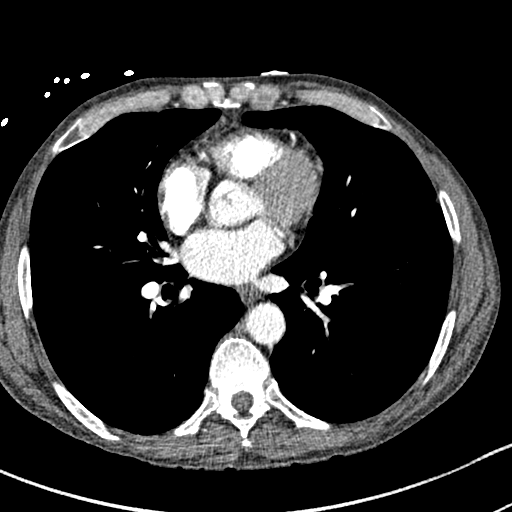
[im 184/331  lung]
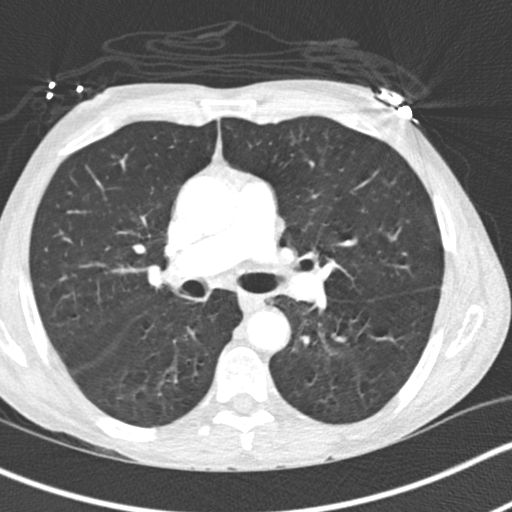
[im 202/331  soft-tissue]
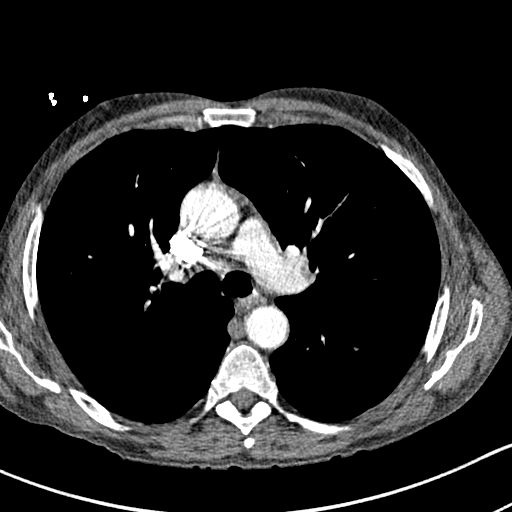
[im 221/331  lung]
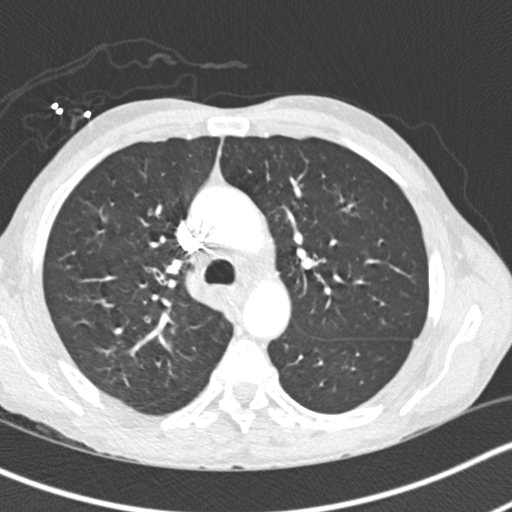
[im 239/331  soft-tissue]
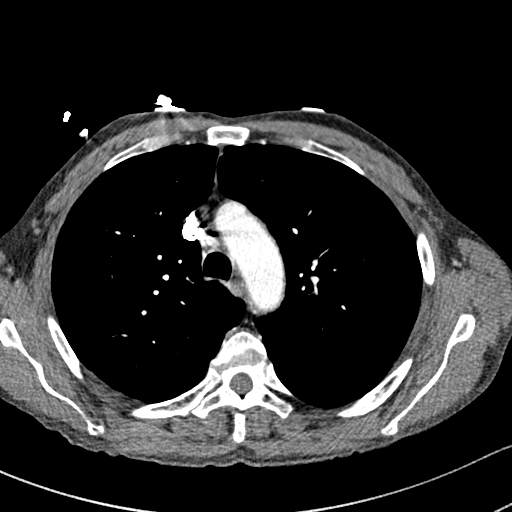
[im 257/331  lung]
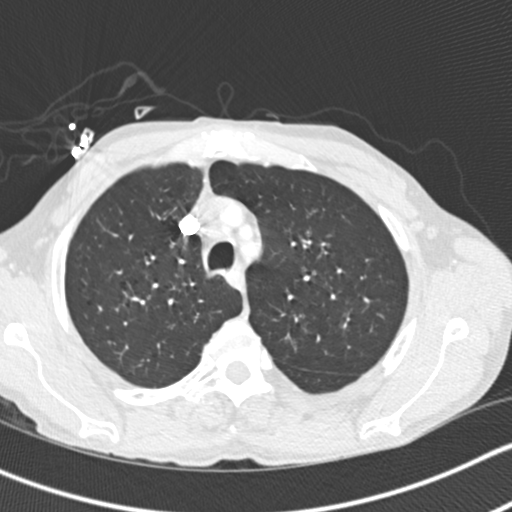
[im 276/331  soft-tissue]
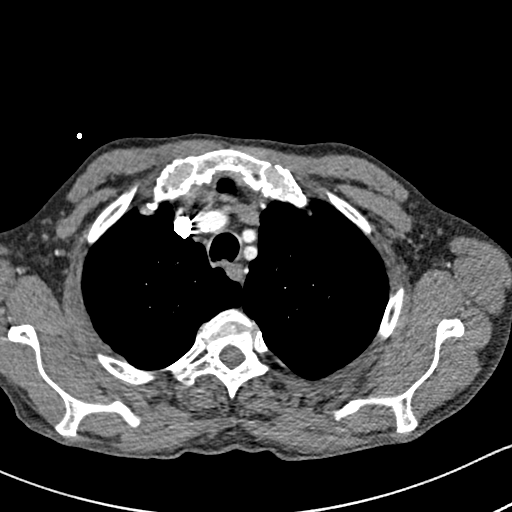
[im 294/331  lung]
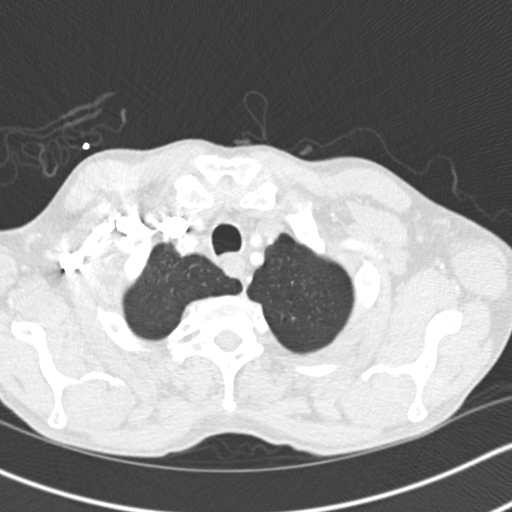
[im 312/331  soft-tissue]
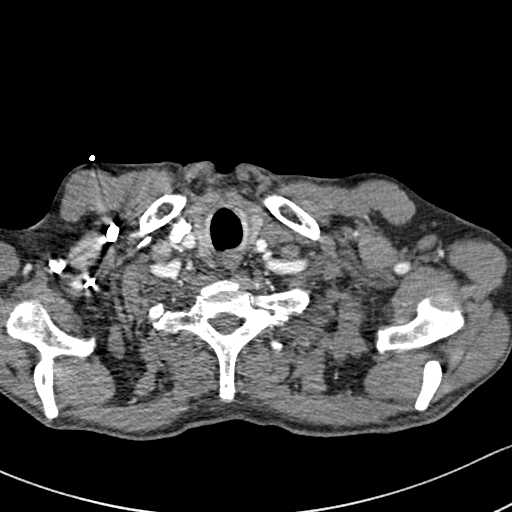

[Series 7: coronal mpr · coronal · 0.65mm/px · 3 of 125 slices shown]
[im 32/125  soft-tissue]
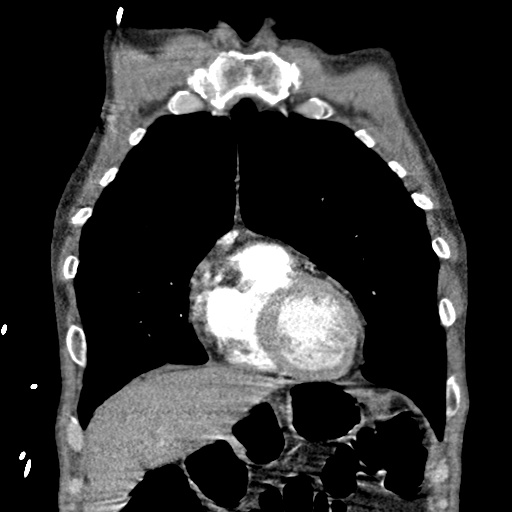
[im 63/125  soft-tissue]
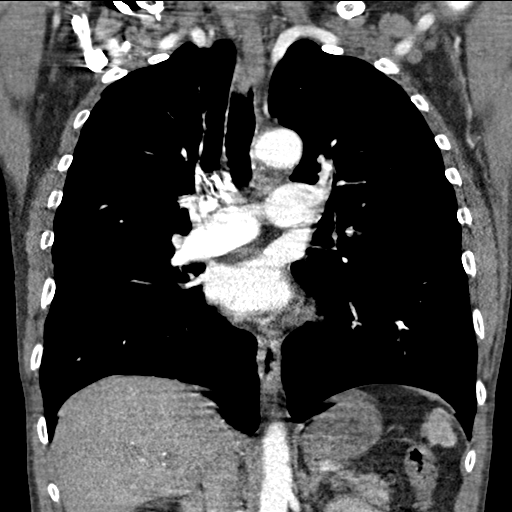
[im 94/125  soft-tissue]
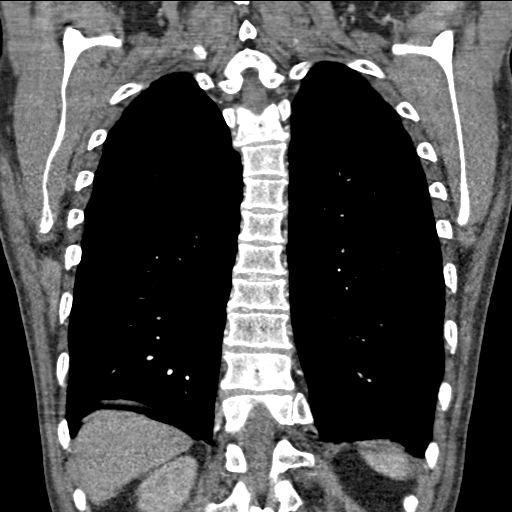

[19 of 46 positions shown; findings below may reference images not displayed]

FINDINGS: Cardiovascular: Satisfactory opacification of the pulmonary arteries
to the segmental level. No evidence of pulmonary embolism. Normal
heart size. No pericardial effusion. Coronary artery calcifications.
Normal caliber aorta without aneurysm or dissection.

Mediastinum/Nodes: No significant lymphadenopathy in the chest.
Small esophageal hiatal hernia with mild distal esophageal wall
thickening suggesting probable reflux changes. No esophageal
dilatation. Thyroid gland is unremarkable.

Lungs/Pleura: Lungs are clear. No pleural effusion or pneumothorax.

Upper Abdomen: Subcentimeter cyst in the upper pole right kidney.
Vascular calcifications.

Musculoskeletal: Degenerative changes in the spine. No destructive
bone lesions.

Review of the MIP images confirms the above findings.
IMPRESSION: No evidence of significant pulmonary embolus. No evidence of active
pulmonary disease.

## 2018-01-12 IMAGING — DX DG ABDOMEN 2V
2 series · 2 of 2 positions shown · non-contrast
Comparison: None.

CLINICAL DATA: Cough, midline abdominal pain, smoking history

EXAM:
ABDOMEN - 2 VIEW

[abdomen erect]
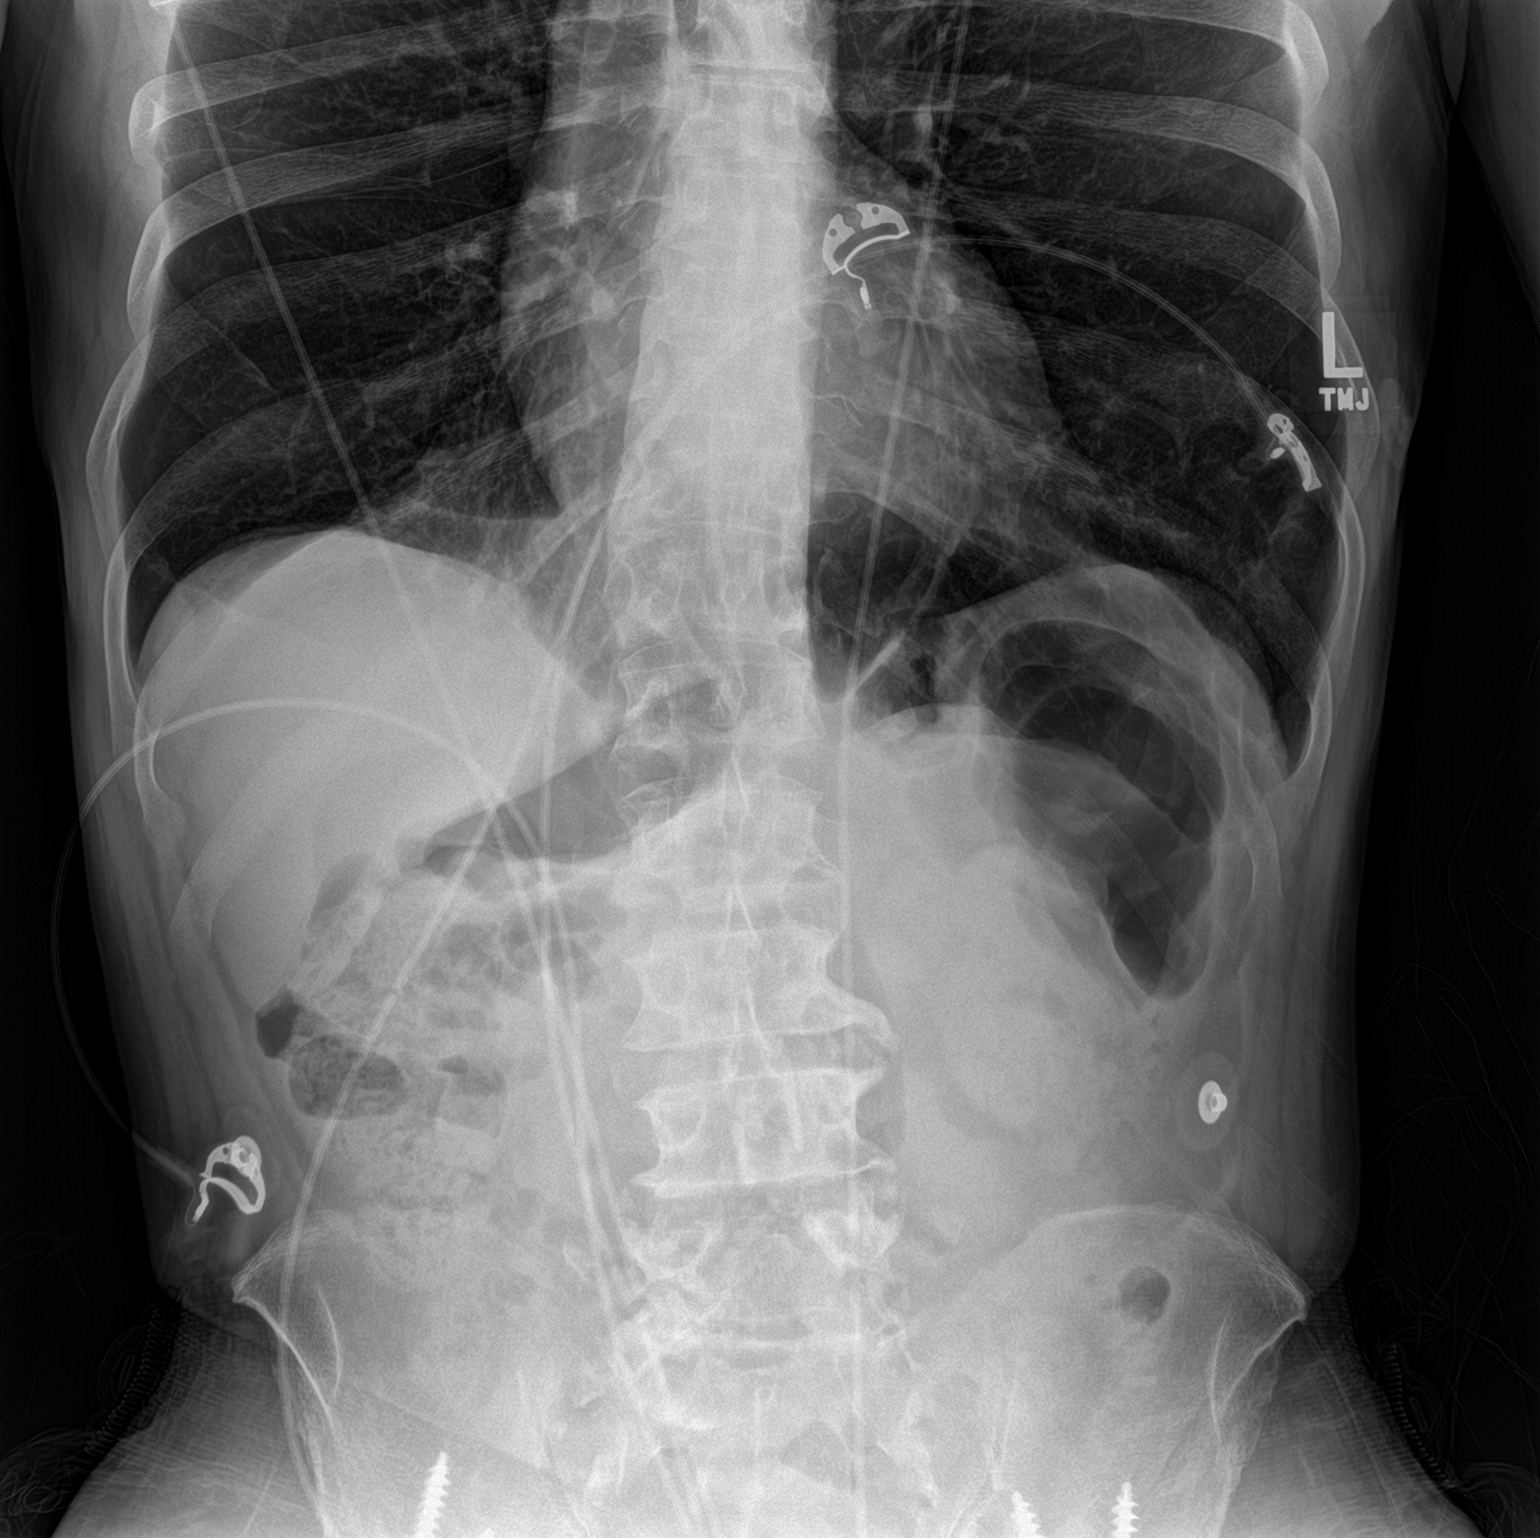

[abdomen supine]
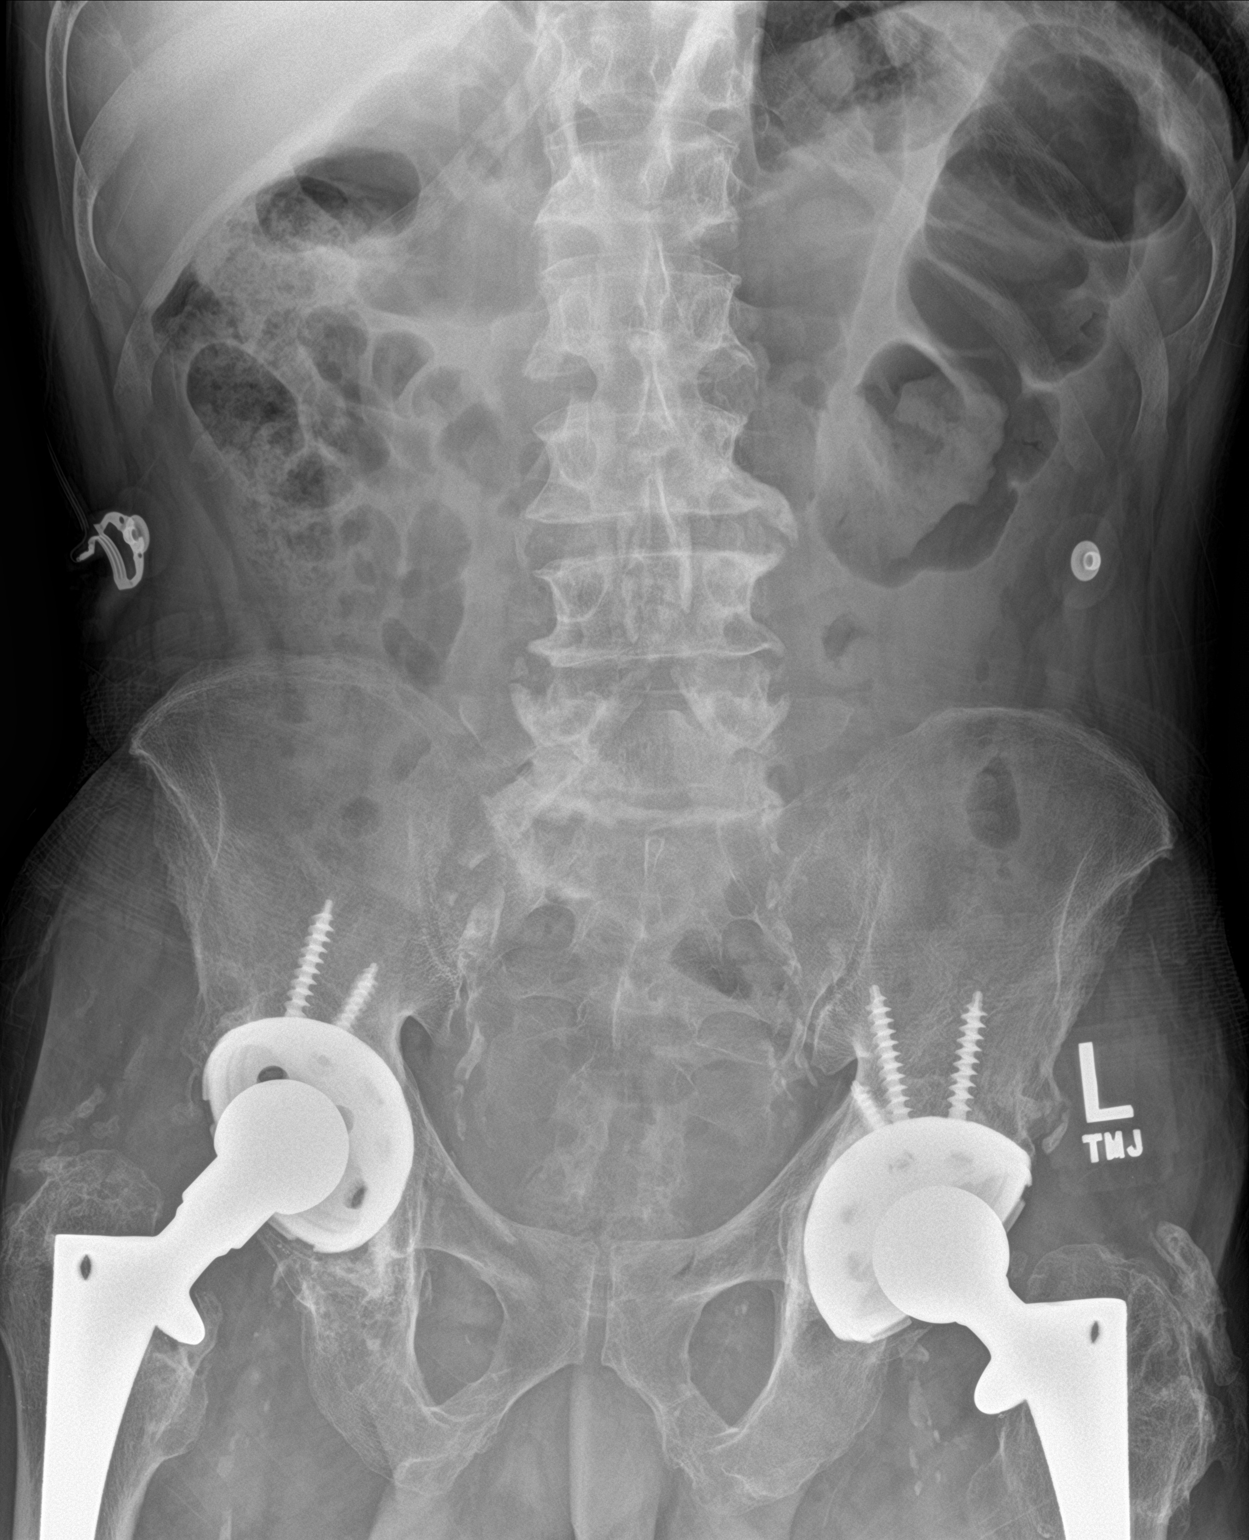

[2 of 2 positions shown; findings below may reference images not displayed]

FINDINGS: Supine and erect views of the abdomen show no bowel obstruction. No
free air is seen on the erect view. No opaque calculi are seen.
There are degenerative changes in the mid to lower lumbar spine.
Bilateral total hip replacements are present.
IMPRESSION: 1. No bowel obstruction.  No free air.
2. Bilateral total hip replacements.

## 2018-01-12 MED FILL — SYMTUZA 800-150-200-10 MG T: 800-150-200 | 30 days supply | Qty: 30 | Fill #0

## 2018-01-12 MED FILL — VENTOLIN HFA 90 MCG INHALER: 108 (90 BAS | 25 days supply | Qty: 18 | Fill #5

## 2018-02-08 MED FILL — AMLODIPINE BESYLATE 5 MG TA: 5 | 30 days supply | Qty: 30 | Fill #4

## 2018-02-08 MED FILL — ONDANSETRON ODT 8 MG TABLET: 8 | 30 days supply | Qty: 30 | Fill #5

## 2018-02-08 MED FILL — SYMTUZA 800-150-200-10 MG T: 800-150-200 | 30 days supply | Qty: 30 | Fill #1

## 2018-02-08 MED FILL — VENTOLIN HFA 90 MCG INHALER: 108 (90 BAS | 25 days supply | Qty: 18 | Fill #6

## 2018-02-09 ENCOUNTER — Telehealth: Payer: Self-pay | Admitting: *Deleted

## 2018-02-09 NOTE — Telephone Encounter (Addendum)
Contacted [name] and we arranged a home visit for tomorrow morning. Focus of visit will be to review his upcoming appts, medication adherence and contact oncology to see when is next appt is. Reminded [name] that he has a lab appt next Tuesday. I think he may be under the influence of Alcohol so I will reinforce the above information tomorrow during our appt. During this call Allen. [name] stated he has been passing blood not enough to go to the ED but feels it is related to his cancer. [name] stated he has not seen Dr Louis Meckel in a while. We discussed his previous appts with Dr Louis Meckel and I feel [name] missed at least one of appts.   I followed up with [facility]/Laura stating I spoke with Allen  [name] and he is a little concerned because he has been passing blood and feels it is related to his bladder cancer. [name] stated he has not seen Dr Louis Meckel in a while. [name] stated [name] has had several appts that he missed. Informed [name] that [name] has some limitation related to his inability to read and write which can be a challenge for him. [name] stated she can have [name] seen by Dr Louis Meckel on Thursday at 11:45.  Called [name] back and instructed him to not drink any alcohol because it could increase the amount of blood passed. [name] verbalized understanding and stated he just used the restroom and he did not pass any blood. I also informed him of his appt with Dr Louis Meckel on Thursday at 11:45.

## 2018-02-10 ENCOUNTER — Ambulatory Visit: Payer: Medicare Other | Admitting: *Deleted

## 2018-02-10 DIAGNOSIS — C670 Malignant neoplasm of trigone of bladder: Secondary | ICD-10-CM

## 2018-02-10 DIAGNOSIS — B20 Human immunodeficiency virus [HIV] disease: Secondary | ICD-10-CM

## 2018-02-11 DIAGNOSIS — R300 Dysuria: Secondary | ICD-10-CM | POA: Diagnosis not present

## 2018-02-11 DIAGNOSIS — C679 Malignant neoplasm of bladder, unspecified: Secondary | ICD-10-CM | POA: Diagnosis not present

## 2018-02-16 ENCOUNTER — Other Ambulatory Visit: Payer: Self-pay | Admitting: *Deleted

## 2018-02-16 ENCOUNTER — Other Ambulatory Visit: Payer: Medicare Other

## 2018-02-16 DIAGNOSIS — B20 Human immunodeficiency virus [HIV] disease: Secondary | ICD-10-CM

## 2018-02-16 DIAGNOSIS — Z113 Encounter for screening for infections with a predominantly sexual mode of transmission: Secondary | ICD-10-CM

## 2018-02-17 ENCOUNTER — Encounter (HOSPITAL_COMMUNITY): Payer: Self-pay | Admitting: Emergency Medicine

## 2018-02-17 ENCOUNTER — Telehealth: Payer: Self-pay | Admitting: *Deleted

## 2018-02-17 ENCOUNTER — Emergency Department (HOSPITAL_COMMUNITY)
Admission: EM | Admit: 2018-02-17 | Discharge: 2018-02-17 | Discharge status: Against Medical Advice | Payer: Medicare Other | Attending: Emergency Medicine | Admitting: Emergency Medicine

## 2018-02-17 ENCOUNTER — Ambulatory Visit: Payer: Self-pay | Admitting: *Deleted

## 2018-02-17 DIAGNOSIS — M549 Dorsalgia, unspecified: Secondary | ICD-10-CM | POA: Diagnosis present

## 2018-02-17 DIAGNOSIS — I10 Essential (primary) hypertension: Secondary | ICD-10-CM | POA: Diagnosis not present

## 2018-02-17 DIAGNOSIS — C674 Malignant neoplasm of posterior wall of bladder: Secondary | ICD-10-CM

## 2018-02-17 DIAGNOSIS — W19XXXA Unspecified fall, initial encounter: Secondary | ICD-10-CM | POA: Diagnosis not present

## 2018-02-17 DIAGNOSIS — Z5321 Procedure and treatment not carried out due to patient leaving prior to being seen by health care provider: Secondary | ICD-10-CM | POA: Insufficient documentation

## 2018-02-17 DIAGNOSIS — M545 Low back pain: Secondary | ICD-10-CM | POA: Diagnosis not present

## 2018-02-17 DIAGNOSIS — M5489 Other dorsalgia: Secondary | ICD-10-CM | POA: Diagnosis not present

## 2018-02-17 NOTE — ED Triage Notes (Signed)
Patient here from home with complaints of back pain after fall today. Denies hitting head.

## 2018-02-17 NOTE — ED Notes (Signed)
Pt sitting outside on benches

## 2018-02-19 NOTE — ED Notes (Signed)
Follow up call made  No answer  02/19/18/1002 s Natacha Jepsen rn

## 2018-02-19 NOTE — Telephone Encounter (Signed)
Received a call from [name], [name] stating over the weekend he fell and hit his back on the edge of his kitchen table. [name] stated he did not hit his head or suffer LOC. [name] stating he is currently getting dressed because he wants to be evaluated in the ED for unrelieved back pain.

## 2018-02-22 ENCOUNTER — Ambulatory Visit (HOSPITAL_COMMUNITY)
Admission: RE | Admit: 2018-02-22 | Discharge: 2018-02-22 | Disposition: A | Discharge status: Home / Self Care | Payer: Medicare Other | Source: Ambulatory Visit | Attending: Family Medicine | Admitting: Family Medicine

## 2018-02-22 ENCOUNTER — Ambulatory Visit (INDEPENDENT_AMBULATORY_CARE_PROVIDER_SITE_OTHER): Payer: Medicare Other | Admitting: Family Medicine

## 2018-02-22 ENCOUNTER — Encounter: Payer: Self-pay | Admitting: Family Medicine

## 2018-02-22 ENCOUNTER — Encounter (HOSPITAL_COMMUNITY): Payer: Self-pay

## 2018-02-22 VITALS — BP 159/64 | HR 62 | Temp 97.5°F | Resp 16 | Ht 64.0 in | Wt 118.0 lb

## 2018-02-22 DIAGNOSIS — R918 Other nonspecific abnormal finding of lung field: Secondary | ICD-10-CM | POA: Insufficient documentation

## 2018-02-22 DIAGNOSIS — R0781 Pleurodynia: Secondary | ICD-10-CM

## 2018-02-22 DIAGNOSIS — C674 Malignant neoplasm of posterior wall of bladder: Secondary | ICD-10-CM | POA: Diagnosis not present

## 2018-02-22 DIAGNOSIS — S299XXA Unspecified injury of thorax, initial encounter: Secondary | ICD-10-CM | POA: Diagnosis not present

## 2018-02-22 DIAGNOSIS — N3000 Acute cystitis without hematuria: Secondary | ICD-10-CM | POA: Diagnosis not present

## 2018-02-22 DIAGNOSIS — J42 Unspecified chronic bronchitis: Secondary | ICD-10-CM

## 2018-02-22 MED ORDER — KETOROLAC TROMETHAMINE 60 MG/2ML IM SOLN
60.0000 mg | Freq: Once | INTRAMUSCULAR | Status: AC
  Administered 2018-02-22: 60 mg via INTRAMUSCULAR

## 2018-02-22 MED ORDER — ALBUTEROL SULFATE (2.5 MG/3ML) 0.083% IN NEBU
2.5000 mg | INHALATION_SOLUTION | Freq: Once | RESPIRATORY_TRACT | Status: AC
  Administered 2018-02-22: 2.5 mg via RESPIRATORY_TRACT

## 2018-02-22 MED ORDER — IPRATROPIUM BROMIDE 0.02 % IN SOLN
0.5000 mg | Freq: Once | RESPIRATORY_TRACT | Status: AC
  Administered 2018-02-22: 0.5 mg via RESPIRATORY_TRACT

## 2018-02-22 MED FILL — traMADol HCL 50 MG TABS: 50 | 6 days supply | Qty: 45 | Fill #0

## 2018-02-22 NOTE — Addendum Note (Signed)
Addended by: Genelle Bal on: 02/22/2018 04:17 PM   Modules accepted: Orders

## 2018-02-22 NOTE — Progress Notes (Signed)
    Subjective   [name] 62 y.o. male  [id number]  [id number]  12/17/1955    Chief Complaint  Patient presents with  . Back Pain  . Abdominal Pain    Patient presents s/p fall. States the fell and hit his right side and back. This happened 02/14/2018. Patient states that he went to the ED (02/17/2018) and he did not stay to be seen. Patient states a hx of COPD. Has not been using his nebulizer machine at home. Having shortness of breath. Patient is a smoker and not interested in quitting.    Review of Systems  Constitutional: Negative.   Respiratory: Positive for shortness of breath and wheezing.   Cardiovascular: Negative.   Musculoskeletal:       Right sided rib pain  Neurological: Negative.   Psychiatric/Behavioral: Negative.     Objective   Physical Exam  Constitutional: He is oriented to person, place, and time. He appears well-developed and well-nourished.  Eyes: Pupils are equal, round, and reactive to light. EOM are normal.  Cardiovascular: Normal rate, regular rhythm, normal heart sounds and intact distal pulses.  Pulmonary/Chest: Effort normal. No respiratory distress. He has wheezes.  Abdominal: Soft. Normal appearance, normal aorta and bowel sounds are normal. There is tenderness (right flank).  Musculoskeletal:       Back:  Neurological: He is alert and oriented to person, place, and time.  Skin: Skin is warm and dry.  Psychiatric: He has a normal mood and affect. His behavior is normal.    BP (!) 159/64 (BP Location: Left Arm, Patient Position: Sitting, Cuff Size: Normal)   Pulse 62   Temp (!) 97.5 F (36.4 C) (Oral)   Resp 16   Ht 5\' 4"  (1.626 m)   Wt 118 lb (53.5 kg)   SpO2 100%   BMI 20.25 kg/m   Assessment   Encounter Diagnoses  Name Primary?  . Rib pain on right side Yes  . Chronic bronchitis, unspecified chronic bronchitis type ([place])      Plan  1. Rib pain on right side Patient given script for tramadol from his oncologist. Has not  filled it yet.  - ketorolac (TORADOL) injection 60 mg - DG Ribs Unilateral Right; Future  2. Chronic bronchitis, unspecified chronic bronchitis type ([place]) Expresses relief with nebulizer. Lungs CTA - ipratropium (ATROVENT) nebulizer solution 0.5 mg - albuterol (PROVENTIL) (2.5 MG/3ML) 0.083% nebulizer solution 2.5 mg    This note has been created with Surveyor, quantity. Any transcriptional errors are unintentional.

## 2018-02-22 NOTE — Patient Instructions (Signed)
Rib Contusion A rib contusion is a deep bruise on your rib area. Contusions are the result of a blunt trauma that causes bleeding and injury to the tissues under the skin. A rib contusion may involve bruising of the ribs and of the skin and muscles in the area. The skin overlying the contusion may turn blue, purple, or yellow. Minor injuries will give you a painless contusion, but more severe contusions may stay painful and swollen for a few weeks. What are the causes? A contusion is usually caused by a blow, trauma, or direct force to an area of the body. This often occurs while playing contact sports. What are the signs or symptoms?  Swelling and redness of the injured area.  Discoloration of the injured area.  Tenderness and soreness of the injured area.  Pain with or without movement. How is this diagnosed? The diagnosis can be made by taking a medical history and performing a physical exam. An X-ray, CT scan, or MRI may be needed to determine if there were any associated injuries, such as broken bones (fractures) or internal injuries. How is this treated? Often, the best treatment for a rib contusion is rest. Icing or applying cold compresses to the injured area may help reduce swelling and inflammation. Deep breathing exercises may be recommended to reduce the risk of partial lung collapse and pneumonia. Over-the-counter or prescription medicines may also be recommended for pain control. Follow these instructions at home:  Apply ice to the injured area: ? Put ice in a plastic bag. ? Place a towel between your skin and the bag. ? Leave the ice on for 20 minutes, 2-3 times per day.  Take medicines only as directed by your health care provider.  Rest the injured area. Avoid strenuous activity and any activities or movements that cause pain. Be careful during activities and avoid bumping the injured area.  Perform deep-breathing exercises as directed by your health care provider.  Do  not lift anything that is heavier than 5 lb (2.3 kg) until your health care provider approves.  Do not use any tobacco products, including cigarettes, chewing tobacco, or electronic cigarettes. If you need help quitting, ask your health care provider. Contact a health care provider if:  You have increased bruising or swelling.  You have pain that is not controlled with treatment.  You have a fever. Get help right away if:  You have difficulty breathing or shortness of breath.  You develop a continual cough, or you cough up thick or bloody sputum.  You feel sick to your stomach (nauseous), you throw up (vomit), or you have abdominal pain. This information is not intended to replace advice given to you by your health care provider. Make sure you discuss any questions you have with your health care provider. Document Released: 04/15/2001 Document Revised: 12/27/2015 Document Reviewed: 05/02/2014 Elsevier Interactive Patient Education  2018 Elsevier Inc.  

## 2018-02-25 ENCOUNTER — Telehealth: Payer: Self-pay

## 2018-02-25 NOTE — Telephone Encounter (Signed)
Called and spoke with patient. Advised of no fracture to ribs. He is feeling better and was asked to keep next scheduled appointment. Thanks!

## 2018-02-25 NOTE — Telephone Encounter (Signed)
-----   Message from Lanae Boast, [place] sent at 02/24/2018  5:33 PM EDT ----- No fracture on the ribs. Please call patient to see how he is feeling. Inform him that he may be sore for the next week and to take his breathing treatments every day as they were prescribed.

## 2018-03-02 ENCOUNTER — Ambulatory Visit (INDEPENDENT_AMBULATORY_CARE_PROVIDER_SITE_OTHER): Payer: Medicare Other | Admitting: Internal Medicine

## 2018-03-02 ENCOUNTER — Encounter: Payer: Self-pay | Admitting: Internal Medicine

## 2018-03-02 DIAGNOSIS — B20 Human immunodeficiency virus [HIV] disease: Secondary | ICD-10-CM

## 2018-03-02 DIAGNOSIS — R634 Abnormal weight loss: Secondary | ICD-10-CM | POA: Diagnosis not present

## 2018-03-02 DIAGNOSIS — F331 Major depressive disorder, recurrent, moderate: Secondary | ICD-10-CM | POA: Diagnosis not present

## 2018-03-02 LAB — COMPREHENSIVE METABOLIC PANEL
AG RATIO: 1 (calc) (ref 1.0–2.5)
ALT: 11 U/L (ref 9–46)
AST: 26 U/L (ref 10–35)
Albumin: 3.9 g/dL (ref 3.6–5.1)
Alkaline phosphatase (APISO): 99 U/L (ref 40–115)
BUN / CREAT RATIO: 7 (calc) (ref 6–22)
BUN: 10 mg/dL (ref 7–25)
CHLORIDE: 94 mmol/L — AB (ref 98–110)
CO2: 22 mmol/L (ref 20–32)
CREATININE: 1.49 mg/dL — AB (ref 0.70–1.25)
Calcium: 9 mg/dL (ref 8.6–10.3)
GLOBULIN: 4 g/dL — AB (ref 1.9–3.7)
GLUCOSE: 93 mg/dL (ref 65–99)
Potassium: 4.1 mmol/L (ref 3.5–5.3)
SODIUM: 124 mmol/L — AB (ref 135–146)
TOTAL PROTEIN: 7.9 g/dL (ref 6.1–8.1)
Total Bilirubin: 0.6 mg/dL (ref 0.2–1.2)

## 2018-03-02 LAB — CBC
HCT: 33.8 % — ABNORMAL LOW (ref 38.5–50.0)
HEMOGLOBIN: 12.2 g/dL — AB (ref 13.2–17.1)
MCH: 33.5 pg — ABNORMAL HIGH (ref 27.0–33.0)
MCHC: 36.1 g/dL — ABNORMAL HIGH (ref 32.0–36.0)
MCV: 92.9 fL (ref 80.0–100.0)
MPV: 9.4 fL (ref 7.5–12.5)
PLATELETS: 198 10*3/uL (ref 140–400)
RBC: 3.64 10*6/uL — AB (ref 4.20–5.80)
RDW: 12.3 % (ref 11.0–15.0)
WBC: 3.2 10*3/uL — AB (ref 3.8–10.8)

## 2018-03-02 MED ORDER — MEGESTROL ACETATE 400 MG/10ML PO SUSP: 400.0000 mg | Freq: Every day | ORAL | 5 refills | Status: DC

## 2018-03-02 NOTE — Progress Notes (Signed)
Patient Active Problem List   Diagnosis Date Noted  . HIV disease ([place]) 04/18/2015    Priority: High  . Dysuria 05/29/2015    Priority: Medium  . Depression 04/19/2015    Priority: Medium  . 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 Malignant neoplasm of bladder ([place]) 03/30/2015    Priority: Medium  . Abdominal pain 02/19/2017  . Exacerbation of asthma 09/09/2016  . Acute respiratory failure with hypoxia ([place]) 09/09/2016  . Unintentional weight loss 07/01/2016  . Anorexia 03/11/2016  . Syncope 10/31/2015  . HTN (hypertension) 03/30/2015  . Asthma, chronic 03/30/2015    Patient's Medications  New Prescriptions   No medications on file  Previous Medications   ALBUTEROL (PROVENTIL HFA;VENTOLIN HFA) 108 (90 BASE) MCG/ACT INHALER    Inhale 2 puffs into the lungs every 6 (six) hours as needed for wheezing or shortness of breath.   AMLODIPINE (NORVASC) 5 MG TABLET    Take 1 tablet (5 mg total) by mouth daily.   CETIRIZINE (ZYRTEC) 10 MG TABLET    Take 1 tablet (10 mg total) by mouth at bedtime.   ONDANSETRON (ZOFRAN ODT) 8 MG DISINTEGRATING TABLET    Take 1 tablet (8 mg total) by mouth daily. Take after you eat and then 30 minutes later take your Symtuza.   SYMTUZA 800-150-200-10 MG TABS    TAKE 1 TABLET BY MOUTH DAILY WITH BREAKFAST.  Modified Medications   Modified Medication Previous Medication   MEGESTROL (MEGACE) 400 MG/10ML SUSPENSION megestrol (MEGACE) 400 MG/10ML suspension      Take 10 mLs (400 mg total) by mouth daily.    Take 10 mLs (400 mg total) by mouth daily.  Discontinued Medications   No medications on file    Subjective: [name] is in for his routine HIV follow-up visit.  He denies any problems obtaining, taking or tolerating his Symtuza.  He denies missing any doses.  He says that he saw Dr. Louis Meckel, his urologist 2 weeks ago.  It sounds like he underwent repeat cystoscopy and was told that his cancer was an active.  He was  told that he did not need to start on any new therapy for his cancer.  He continues to have problem with his appetite and unintentional weight loss.  He is having some problems with nausea and vomiting.  He takes ondansetron daily before he takes his medications but still has occasional nausea and vomiting when eating meals.  He describes early satiety and decreased appetite.  He says that he is very tired and often sleeps during the day.  He is feeling more depressed.  Review of Systems: Review of Systems  Constitutional: Positive for malaise/fatigue and weight loss. Negative for chills, diaphoresis and fever.  HENT: Negative for sore throat.   Respiratory: Negative for cough, sputum production and shortness of breath.   Cardiovascular: Negative for chest pain.  Gastrointestinal: Positive for nausea and vomiting. Negative for abdominal pain, constipation, diarrhea and heartburn.       As described in HPI.  Genitourinary: Negative for dysuria and frequency.  Musculoskeletal: Negative for joint pain and myalgias.  Skin: Negative for rash.  Neurological: Negative for dizziness and headaches.  Psychiatric/Behavioral: Positive for depression. Negative for substance abuse. The patient is not nervous/anxious.     Past Medical History:  Diagnosis Date  . Asthma with COPD ([place])   . Bladder cancer ([place])    and prostatic urethra  cancer  HX TURBT'S IN [place], [place]  WITH INSTILLATION CHEMO TX'S  . Bladder tumor   . History of transfusion   . HIV disease ([place])    under care of [place] [facility]  . Hypertension   . Nocturia     Social History   Tobacco Use  . Smoking status: Current Every Day Smoker    Packs/day: 0.10    Years: 17.00    Pack years: 1.70    Types: Cigarettes  . Smokeless tobacco: Never Used  . Tobacco comment: 3/day; looking into vaping instead  Substance Use Topics  . Alcohol use: Yes    Alcohol/week: 3.6 oz    Types: 6 Cans of beer per week     Comment: occasional  . Drug use: No    Family History  Problem Relation Age of Onset  . Cancer Mother        Gastric Cancer  . Hypertension Mother   . Heart disease Mother   . Cancer Maternal Grandmother        breast cancer     Allergies  Allergen Reactions  . Aspirin Shortness Of Breath, Nausea And Vomiting and Swelling    Health Maintenance  Topic Date Due  . COLONOSCOPY  10/05/2003  . TETANUS/TDAP  04/02/2027 (Originally 10/04/1974)  . INFLUENZA VACCINE  03/04/2018  . Hepatitis C Screening  Completed  . HIV Screening  Completed    Objective:  Vitals:   03/02/18 1037  BP: 130/71  Pulse: 71  Temp: 97.6 F (36.4 C)  TempSrc: Oral  Weight: 115 lb (52.2 kg)   Body mass index is 19.74 kg/m.  Physical Exam  Constitutional: He is oriented to person, place, and time.  He is lost 3 pounds since his last visit.  HENT:  Mouth/Throat: No oropharyngeal exudate.  Eyes: Conjunctivae are normal.  Cardiovascular: Normal rate, regular rhythm and normal heart sounds.  No murmur heard. Pulmonary/Chest: Effort normal and breath sounds normal.  Abdominal: Soft. He exhibits no mass. There is no tenderness.  Musculoskeletal: Normal range of motion.  Neurological: He is alert and oriented to person, place, and time.  Skin: No rash noted.  Psychiatric:  He is more down and injected today.    Lab Results Lab Results  Component Value Date   WBC 3.2 (L) 04/01/2017   HGB 12.3 (L) 04/01/2017   HCT 36.1 (L) 04/01/2017   MCV 98.4 04/01/2017   PLT 217 04/01/2017    Lab Results  Component Value Date   CREATININE 0.98 04/01/2017   BUN 7 04/01/2017   NA 131 (L) 04/01/2017   K 4.9 04/01/2017   CL 102 04/01/2017   CO2 17 (L) 04/01/2017    Lab Results  Component Value Date   ALT 20 04/01/2017   AST 61 (H) 04/01/2017   ALKPHOS 104 04/01/2017   BILITOT 0.3 04/01/2017    Lab Results  Component Value Date   CHOL 129 10/17/2015   HDL 92 10/17/2015   LDLCALC 28 10/17/2015     TRIG 43 10/17/2015   CHOLHDL 1.4 10/17/2015   Lab Results  Component Value Date   LABRPR NON REAC 10/17/2015   HIV 1 RNA Quant (copies/mL)  Date Value  11/05/2017 38 (H)  07/29/2017 1,200 (H)  05/26/2017 2,170 (H)   CD4 T Cell Abs (/uL)  Date Value  07/29/2017 620  05/26/2017 620  02/09/2017 720     Problem List Items Addressed This Visit      High  HIV disease ([place])    His infection has started to come under better control since restarting antiretroviral therapy earlier this year.  I will have him continue his current regimen, get lab work today and follow-up in 6 weeks.      Relevant Orders   CBC   Comprehensive metabolic panel   HIV 1 RNA quant-no reflex-bld   T-helper cell (CD4)- ([name] clinic only)     Medium   Depression    His depression is a little worse due to his ongoing problems and unintentional weight loss.  I will have our community out reach nurse, [name], touch base with him soon.        Unprioritized   Unintentional weight loss    Suspect that his unintentional weight loss is multifactorial.  I will have him continue and ondansetron as needed.  I will have him restart megesterol which has helped improve his appetite in the past.      Relevant Medications   megestrol (MEGACE) 400 MG/10ML suspension        Michel Bickers, MD [facility] [place] [phone] pager   [phone] cell 03/02/2018, 11:21 AM

## 2018-03-02 NOTE — Assessment & Plan Note (Signed)
His infection has started to come under better control since restarting antiretroviral therapy earlier this year.  I will have him continue his current regimen, get lab work today and follow-up in 6 weeks.

## 2018-03-02 NOTE — Assessment & Plan Note (Signed)
Suspect that his unintentional weight loss is multifactorial.  I will have him continue and ondansetron as needed.  I will have him restart megesterol which has helped improve his appetite in the past.

## 2018-03-02 NOTE — Assessment & Plan Note (Signed)
His depression is a little worse due to his ongoing problems and unintentional weight loss.  I will have our community out reach nurse, [name], touch base with him soon.

## 2018-03-03 ENCOUNTER — Ambulatory Visit: Payer: Medicare Other | Admitting: Family Medicine

## 2018-03-03 LAB — T-HELPER CELL (CD4) - (RCID CLINIC ONLY)
CD4 % Helper T Cell: 46 % (ref 33–55)
CD4 T Cell Abs: 540 /uL (ref 400–2700)

## 2018-03-05 ENCOUNTER — Encounter: Payer: Self-pay | Admitting: Family Medicine

## 2018-03-05 ENCOUNTER — Ambulatory Visit: Payer: Medicare Other | Admitting: Family Medicine

## 2018-03-05 ENCOUNTER — Ambulatory Visit (INDEPENDENT_AMBULATORY_CARE_PROVIDER_SITE_OTHER): Payer: Medicare Other | Admitting: Family Medicine

## 2018-03-05 VITALS — BP 131/50 | HR 71 | Temp 98.6°F | Resp 16 | Ht 64.0 in | Wt 121.0 lb

## 2018-03-05 DIAGNOSIS — S20211D Contusion of right front wall of thorax, subsequent encounter: Secondary | ICD-10-CM

## 2018-03-05 LAB — HIV-1 RNA QUANT-NO REFLEX-BLD
HIV 1 RNA Quant: 89 copies/mL — ABNORMAL HIGH
HIV-1 RNA Quant, Log: 1.95 Log copies/mL — ABNORMAL HIGH

## 2018-03-05 NOTE — Progress Notes (Signed)
    Subjective   [name] 62 y.o. male  [id number]  [id number]  09/16/1955    Chief Complaint  Patient presents with  . Follow-up    Patient follow up on right sided rib pain. Patient states that he is improving. He denies difficulty with breathing.    Review of Systems  Constitutional: Negative.   Respiratory: Positive for shortness of breath and wheezing.   Cardiovascular: Negative.   Musculoskeletal:       Right sided rib pain  Neurological: Negative.   Psychiatric/Behavioral: Negative.     Objective   Physical Exam  Constitutional: He is oriented to person, place, and time. He appears well-developed and well-nourished.  Eyes: Pupils are equal, round, and reactive to light. EOM are normal.  Cardiovascular: Normal rate, regular rhythm, normal heart sounds and intact distal pulses.  Pulmonary/Chest: Effort normal. No respiratory distress. He has no wheezes. He has no rales. He exhibits no tenderness.  Abdominal: Soft. Normal appearance, normal aorta and bowel sounds are normal. There is tenderness (right flank).  Musculoskeletal:       Back:  Neurological: He is alert and oriented to person, place, and time.  Skin: Skin is warm and dry.  Psychiatric: He has a normal mood and affect. His behavior is normal.    BP (!) 131/50 (BP Location: Left Arm, Patient Position: Sitting, Cuff Size: Normal)   Pulse 71   Temp 98.6 F (37 C) (Oral)   Resp 16   Ht 5\' 4"  (1.626 m)   Wt 121 lb (54.9 kg)   SpO2 100%   BMI 20.77 kg/m   Assessment   Encounter Diagnosis  Name Primary?  . Bruised rib, right, subsequent encounter Yes     Plan  1. Rib pain on right side Improving but not fully resolved. The current medical regimen is effective;  continue present plan and medications.   The patient was given clear instructions to go to ER or return to medical center if symptoms do not improve, worsen or new problems develop. The patient verbalized understanding and agreed with  plan of care.           This note has been created with Surveyor, quantity. Any transcriptional errors are unintentional.

## 2018-03-05 NOTE — Patient Instructions (Signed)
Rib Contusion A rib contusion is a deep bruise on your rib area. Contusions are the result of a blunt trauma that causes bleeding and injury to the tissues under the skin. A rib contusion may involve bruising of the ribs and of the skin and muscles in the area. The skin overlying the contusion may turn blue, purple, or yellow. Minor injuries will give you a painless contusion, but more severe contusions may stay painful and swollen for a few weeks. What are the causes? A contusion is usually caused by a blow, trauma, or direct force to an area of the body. This often occurs while playing contact sports. What are the signs or symptoms?  Swelling and redness of the injured area.  Discoloration of the injured area.  Tenderness and soreness of the injured area.  Pain with or without movement. How is this diagnosed? The diagnosis can be made by taking a medical history and performing a physical exam. An X-ray, CT scan, or MRI may be needed to determine if there were any associated injuries, such as broken bones (fractures) or internal injuries. How is this treated? Often, the best treatment for a rib contusion is rest. Icing or applying cold compresses to the injured area may help reduce swelling and inflammation. Deep breathing exercises may be recommended to reduce the risk of partial lung collapse and pneumonia. Over-the-counter or prescription medicines may also be recommended for pain control. Follow these instructions at home:  Apply ice to the injured area: ? Put ice in a plastic bag. ? Place a towel between your skin and the bag. ? Leave the ice on for 20 minutes, 2-3 times per day.  Take medicines only as directed by your health care provider.  Rest the injured area. Avoid strenuous activity and any activities or movements that cause pain. Be careful during activities and avoid bumping the injured area.  Perform deep-breathing exercises as directed by your health care provider.  Do  not lift anything that is heavier than 5 lb (2.3 kg) until your health care provider approves.  Do not use any tobacco products, including cigarettes, chewing tobacco, or electronic cigarettes. If you need help quitting, ask your health care provider. Contact a health care provider if:  You have increased bruising or swelling.  You have pain that is not controlled with treatment.  You have a fever. Get help right away if:  You have difficulty breathing or shortness of breath.  You develop a continual cough, or you cough up thick or bloody sputum.  You feel sick to your stomach (nauseous), you throw up (vomit), or you have abdominal pain. This information is not intended to replace advice given to you by your health care provider. Make sure you discuss any questions you have with your health care provider. Document Released: 04/15/2001 Document Revised: 12/27/2015 Document Reviewed: 05/02/2014 Elsevier Interactive Patient Education  2018 Elsevier Inc.  

## 2018-03-08 ENCOUNTER — Encounter: Payer: Self-pay | Admitting: Family Medicine

## 2018-03-08 MED FILL — VENTOLIN HFA 90 MCG INHALER: 108 (90 BAS | 25 days supply | Qty: 18 | Fill #7

## 2018-03-08 MED FILL — SYMTUZA 800-150-200-10 MG T: 800-150-200 | 30 days supply | Qty: 30 | Fill #2

## 2018-03-11 ENCOUNTER — Telehealth: Payer: Self-pay | Admitting: *Deleted

## 2018-03-11 NOTE — Telephone Encounter (Signed)
RN received a call from [name] stating the apartment complex needs him to complete some paperwork in reference to his apartment and would like to know if I could come by his home tomorrow to assist him with completing the ppk. Agreed to come by tomorrow morning.

## 2018-03-16 ENCOUNTER — Other Ambulatory Visit: Payer: Self-pay | Admitting: *Deleted

## 2018-03-16 DIAGNOSIS — Z9114 Patient's other noncompliance with medication regimen: Secondary | ICD-10-CM

## 2018-03-16 DIAGNOSIS — B20 Human immunodeficiency virus [HIV] disease: Secondary | ICD-10-CM

## 2018-03-16 DIAGNOSIS — C674 Malignant neoplasm of posterior wall of bladder: Secondary | ICD-10-CM

## 2018-03-16 MED ORDER — ENSURE PO LIQD
237.0000 mL | Freq: Two times a day (BID) | ORAL | 11 refills | Status: DC
Start: 2018-03-16 — End: 2020-08-15

## 2018-04-01 MED FILL — SYMTUZA 800-150-200-10 MG T: 800-150-200 | 30 days supply | Qty: 30 | Fill #3

## 2018-04-01 MED FILL — VENTOLIN HFA 90 MCG INHALER: 108 (90 BAS | 25 days supply | Qty: 18 | Fill #8

## 2018-04-01 MED FILL — AMLODIPINE BESYLATE 5 MG TA: 5 | 30 days supply | Qty: 30 | Fill #5

## 2018-04-01 NOTE — Progress Notes (Signed)
Home visit made today with [name] to follow up on concerns yesterday by [name]. He stated his cc is  "Passing blood makes me concerned that my Bladder Cancer is worst"  On assessment today all vitals are WNLs but  [name] stated for the last several days (about 3 days ago) he continued to pass blood with urination. He denies any concerns today and says the symptoms resolved on their own.   For the past 3 days he stated it was not bright red blood but enough to change the color of his urine. His symptoms were accompanied by burning during urination and feelings of malaise. He states is diet, activity or medications regimen has not changed to aggravate or cause a change in his urine color. He could not express any relieving factors but states the "blood" was most noticeable during his 1st morning urination. [name] states on the 0 to 10 pain scale his discomfort is about a 4 but states today all is fine.  With his hx of bladder cancer and my concern that he may have a UTI I have contacted [facility] and scheduled a follow up appt for tomorrow at 11:45. I have reminded [name] of his appt and to be sure keeps this appt.

## 2018-04-01 NOTE — Progress Notes (Signed)
Received a call from [name] today stating he is on his way to the ER because he suffered a fall and he believes he my have hurt himself. [name] stated he does not need a home visit but would like to know if I can f/u on his medications that are at the pharmacy. [name] stated he has a prescription at [facility] that was prescribed by [facility]/[name] to treat his previous "passing of blood".   I agreed to follow up on his medication and encouraged him to be evaluated post fall. Traveled to [place] and spoke with the pharmacist who stated PYRIDIUM was called in by [name] but the copay is over 65 dollars. Contcated [name] who stated he did not believe it was a copay. [name] stated he cannot afford the co-pay. Suggested to [name] that he may want to get AZO over the counter which is also a cheaper version of pyridium. Allen. [name] declined stating he thought the medications was for pain since he no longer is passing blood.

## 2018-04-08 ENCOUNTER — Telehealth: Payer: Self-pay | Admitting: *Deleted

## 2018-04-08 NOTE — Telephone Encounter (Signed)
I approve this plan of care.

## 2018-04-08 NOTE — Telephone Encounter (Signed)
Current Orders Effective 01/11/18  Individualized Plan Of Care, Certification Period of  to 04/12/18 to 07/11/2018 a. Type of service(s) and care to be delivered: [facility] Nurse   Frequency and duration of service: Effective: 04/12/18 56mo3, 3PRN's for complications with disease process/progression, medication changes or concerns b. Activity restrictions: Pt may be up as tolerated and can safely ambulate without the need for a assistive device. Pt wears glasses c. Safety Measures: Standard Precautions/Infection Control d. Service Objectives and Goals:Service Objectives are to assist the pt with HIV medication regimen adherence and staying in care with the Infectious Disease Clinic by identifying barriers to care. RN will address the barriers that are identified by the patient.   Patient continues to have unmet goals with adherence questionable. He continues to struggle with eating regularly and consistent adherence to medications. Patient centered goal is to stay in care, manage his HIV and have affordable housing. My goal is to bridge Mr Deriso with [facility] Case Management and a possible discharge from my services.   e. Equipment required: No additional equipment needs at this time f. Functional Limitations: Vision. Pt has corrective glasses that he wears g. Rehabilitation potential: Guarded h. Diet and Nutritional Needs: Regular Diet i. Medications and treatments: Medications have been reconciled and reviewed and are a part of EPIC electronic file j. Specific therapies if needed: RN k. Pertinent diagnoses: Hypertension, Asthma, Bladder Cancer with Chemo treatment, Depression l. Expected outcome: Guarded

## 2018-04-14 ENCOUNTER — Ambulatory Visit: Payer: Medicare Other | Admitting: Internal Medicine

## 2018-04-23 ENCOUNTER — Other Ambulatory Visit: Payer: Self-pay | Admitting: Pharmacist

## 2018-04-23 DIAGNOSIS — I10 Essential (primary) hypertension: Secondary | ICD-10-CM

## 2018-04-26 ENCOUNTER — Other Ambulatory Visit: Payer: Medicare Other | Admitting: *Deleted

## 2018-04-26 MED FILL — SYMTUZA 800-150-200-10 MG T: 800-150-200 | 30 days supply | Qty: 30 | Fill #4

## 2018-04-26 MED FILL — AMLODIPINE BESYLATE 5 MG TA: 5 | 30 days supply | Qty: 30 | Fill #0

## 2018-04-26 MED FILL — VENTOLIN HFA 90 MCG INHALER: 108 (90 BAS | 25 days supply | Qty: 18 | Fill #9

## 2018-05-07 ENCOUNTER — Other Ambulatory Visit: Payer: Medicare Other | Admitting: *Deleted

## 2018-05-20 MED FILL — AMLODIPINE BESYLATE 5 MG TA: 5 | 30 days supply | Qty: 30 | Fill #1

## 2018-05-20 MED FILL — VENTOLIN HFA 90 MCG INHALER: 108 (90 BAS | 25 days supply | Qty: 18 | Fill #10

## 2018-05-20 MED FILL — SYMTUZA 800-150-200-10 MG T: 800-150-200 | 30 days supply | Qty: 30 | Fill #5

## 2018-05-25 ENCOUNTER — Other Ambulatory Visit: Payer: Medicare Other

## 2018-05-27 ENCOUNTER — Telehealth: Payer: Self-pay

## 2018-05-27 NOTE — Telephone Encounter (Signed)
Patient will be at his appointment tomorrow at 11am.

## 2018-05-28 ENCOUNTER — Ambulatory Visit (INDEPENDENT_AMBULATORY_CARE_PROVIDER_SITE_OTHER): Payer: Medicare Other | Admitting: Family Medicine

## 2018-05-28 ENCOUNTER — Encounter: Payer: Self-pay | Admitting: Family Medicine

## 2018-05-28 VITALS — BP 169/51 | HR 63 | Temp 97.1°F | Resp 18 | Ht 64.0 in | Wt 123.0 lb

## 2018-05-28 DIAGNOSIS — I10 Essential (primary) hypertension: Secondary | ICD-10-CM | POA: Diagnosis not present

## 2018-05-28 DIAGNOSIS — J441 Chronic obstructive pulmonary disease with (acute) exacerbation: Secondary | ICD-10-CM | POA: Diagnosis not present

## 2018-05-28 DIAGNOSIS — Z1211 Encounter for screening for malignant neoplasm of colon: Secondary | ICD-10-CM

## 2018-05-28 DIAGNOSIS — Z23 Encounter for immunization: Secondary | ICD-10-CM

## 2018-05-28 DIAGNOSIS — R8290 Unspecified abnormal findings in urine: Secondary | ICD-10-CM

## 2018-05-28 LAB — POCT URINALYSIS DIPSTICK
Bilirubin, UA: NEGATIVE
Glucose, UA: NEGATIVE
Ketones, UA: NEGATIVE
Nitrite, UA: NEGATIVE
Protein, UA: NEGATIVE
Spec Grav, UA: 1.01 (ref 1.010–1.025)
Urobilinogen, UA: 0.2 E.U./dL
pH, UA: 5.5 (ref 5.0–8.0)

## 2018-05-28 MED ORDER — NEBULIZER/TUBING/MOUTHPIECE KIT: 1.0000 [IU] | PACK | Freq: Every day | 11 refills | Status: DC

## 2018-05-28 MED ORDER — IPRATROPIUM-ALBUTEROL 0.5-2.5 (3) MG/3ML IN SOLN: 3.0000 mL | Freq: Four times a day (QID) | RESPIRATORY_TRACT | 11 refills | Status: DC | PRN

## 2018-05-28 NOTE — Patient Instructions (Signed)

## 2018-05-28 NOTE — Progress Notes (Signed)
Patient [facility]  Provider: Lanae Boast, FNP   Hypertension Follow Up Visit  SUBJECTIVE:  [name] is a 62 y.o. male who  has a past medical history of Asthma with COPD ([place]), Bladder cancer ([place]), Bladder tumor, History of transfusion, HIV disease ([place]), Hypertension, and Nocturia. .   Patient under the care of urology for bladder cancer. He denies problems or concerns today. "I feel pretty good".   Current Outpatient Medications  Medication Sig Dispense Refill  . albuterol (PROVENTIL HFA;VENTOLIN HFA) 108 (90 Base) MCG/ACT inhaler Inhale 2 puffs into the lungs every 6 (six) hours as needed for wheezing or shortness of breath. 1 Inhaler 11  . amLODipine (NORVASC) 5 MG tablet TAKE 1 TABLET (5 MG TOTAL) BY MOUTH DAILY. 30 tablet 5  . ENSURE (ENSURE) Take 237 mLs by mouth 2 (two) times daily between meals. Decreased Nutritional intake due to Bladder Cancer treatment and uncontrolled HIV 237 mL 11  . megestrol (MEGACE) 400 MG/10ML suspension Take 10 mLs (400 mg total) by mouth daily. 300 mL 5  . cetirizine (ZYRTEC) 10 MG tablet Take 1 tablet (10 mg total) by mouth at bedtime. (Patient not taking: Reported on 02/22/2018) 90 tablet 0  . ipratropium-albuterol (DUONEB) 0.5-2.5 (3) MG/3ML SOLN Take 3 mLs by nebulization every 6 (six) hours as needed (wheezing and chest tightness). 360 mL 11  . ondansetron (ZOFRAN ODT) 8 MG disintegrating tablet Take 1 tablet (8 mg total) by mouth daily. Take after you eat and then 30 minutes later take your Symtuza. (Patient not taking: Reported on 05/28/2018) 30 tablet 5  . Respiratory Therapy Supplies (NEBULIZER/TUBING/MOUTHPIECE) KIT 1 Units by Does not apply route daily. 1 each 11  . SYMTUZA 800-150-200-10 MG TABS TAKE 1 TABLET BY MOUTH DAILY WITH BREAKFAST. 30 tablet 5   No current facility-administered medications for this visit.     Recent Results (from the past 2160 hour(s))  Urinalysis Dipstick      Status: Abnormal   Collection Time: 05/28/18 11:25 AM  Result Value Ref Range   Color, UA     Clarity, UA     Glucose, UA Negative Negative   Bilirubin, UA neg    Ketones, UA neg    Spec Grav, UA 1.010 1.010 - 1.025   Blood, UA small    pH, UA 5.5 5.0 - 8.0   Protein, UA Negative Negative   Urobilinogen, UA 0.2 0.2 or 1.0 E.U./dL   Nitrite, UA neg    Leukocytes, UA Moderate (2+) (A) Negative   Appearance     Odor    Urine Culture     Status: None   Collection Time: 05/28/18 11:51 AM  Result Value Ref Range   Urine Culture, Routine Final report    Organism ID, Bacteria No growth     Hypertension ROS: Review of Systems  Constitutional: Negative.   HENT: Negative.   Eyes: Negative.   Respiratory: Negative.   Cardiovascular: Negative.   Gastrointestinal: Negative.   Genitourinary: Negative.   Musculoskeletal: Negative.   Skin: Negative.   Neurological: Negative.   Psychiatric/Behavioral: Negative.      OBJECTIVE:   BP (!) 169/51 (BP Location: Right Arm, Patient Position: Sitting, Cuff Size: Normal)   Pulse 63   Temp (!) 97.1 F (36.2 C) (Oral)   Resp 18   Ht 5' 4"  (1.626 m)   Wt 123 lb (55.8 kg)   SpO2 100%   BMI 21.11 kg/m   Physical  Exam  Constitutional: He is oriented to person, place, and time and well-developed, well-nourished, and in no distress. No distress.  HENT:  Head: Normocephalic and atraumatic.  Eyes: Pupils are equal, round, and reactive to light. Conjunctivae and EOM are normal.  Neck: Normal range of motion. Neck supple.  Cardiovascular: Normal rate, regular rhythm and intact distal pulses. Exam reveals no gallop and no friction rub.  No murmur heard. Pulmonary/Chest: Effort normal and breath sounds normal. No respiratory distress. He has no wheezes.  Abdominal: Soft. Bowel sounds are normal. There is no tenderness.  Musculoskeletal: Normal range of motion. He exhibits no edema or tenderness.  Lymphadenopathy:    He has no cervical  adenopathy.  Neurological: He is alert and oriented to person, place, and time. Gait normal.  Skin: Skin is warm and dry.  Psychiatric: Mood, memory, affect and judgment normal.  Nursing note and vitals reviewed.    ASSESSMENT/PLAN:   1. Essential hypertension No medication changes warranted at the present time.     - Urinalysis Dipstick  2. Screen for colon cancer [facility] - Ambulatory referral to Gastroenterology - [facility] COLONOSCOPY; Future  3. Chronic obstructive pulmonary disease with acute exacerbation (HCC) - ipratropium-albuterol (DUONEB) 0.5-2.5 (3) MG/3ML SOLN; Take 3 mLs by nebulization every 6 (six) hours as needed (wheezing and chest tightness).  Dispense: 360 mL; Refill: 11 - Respiratory Therapy Supplies (NEBULIZER/TUBING/MOUTHPIECE) KIT; 1 Units by Does not apply route daily.  Dispense: 1 each; Refill: 11  4. Abnormal urinalysis Denies complaints - Urine Culture   5. Flu vaccine need Influenza vaccination given in the office visit today.   The patient is asked to make an attempt to improve diet and exercise patterns to aid in medical management of this problem.  Return to care as scheduled and prn. Patient verbalized understanding and agreed with plan of care.    Ms. Doug Sou. Nathaneil Canary, FNP-BC Patient [facility] [address] [place], [place] [address] [phone]

## 2018-05-30 LAB — URINE CULTURE: Organism ID, Bacteria: NO GROWTH

## 2018-06-07 ENCOUNTER — Encounter: Payer: Self-pay | Admitting: Gastroenterology

## 2018-06-08 ENCOUNTER — Ambulatory Visit: Payer: Medicare Other | Admitting: Internal Medicine

## 2018-06-10 ENCOUNTER — Other Ambulatory Visit: Payer: Self-pay | Admitting: *Deleted

## 2018-06-10 ENCOUNTER — Ambulatory Visit: Payer: Self-pay | Admitting: *Deleted

## 2018-06-10 ENCOUNTER — Ambulatory Visit: Payer: Medicare Other | Admitting: Family Medicine

## 2018-06-10 DIAGNOSIS — J45909 Unspecified asthma, uncomplicated: Secondary | ICD-10-CM

## 2018-06-10 DIAGNOSIS — B20 Human immunodeficiency virus [HIV] disease: Secondary | ICD-10-CM

## 2018-06-11 ENCOUNTER — Other Ambulatory Visit: Payer: Self-pay | Admitting: Internal Medicine

## 2018-06-11 DIAGNOSIS — B20 Human immunodeficiency virus [HIV] disease: Secondary | ICD-10-CM

## 2018-06-14 MED FILL — AMLODIPINE BESYLATE 5 MG TA: 5 | 30 days supply | Qty: 30 | Fill #2

## 2018-06-14 MED FILL — SYMTUZA 800-150-200-10 MG T: 800-150-200 | 30 days supply | Qty: 30 | Fill #0

## 2018-06-14 MED FILL — VENTOLIN HFA 90 MCG INHALER: 108 (90 BAS | 25 days supply | Qty: 18 | Fill #11

## 2018-06-16 ENCOUNTER — Ambulatory Visit: Payer: Medicare Other | Admitting: Family Medicine

## 2018-06-23 ENCOUNTER — Ambulatory Visit: Payer: Medicare Other | Admitting: Family Medicine

## 2018-06-25 ENCOUNTER — Ambulatory Visit: Payer: Self-pay | Admitting: *Deleted

## 2018-06-25 VITALS — BP 140/80 | HR 90

## 2018-06-25 DIAGNOSIS — Z91148 Patient's other noncompliance with medication regimen for other reason: Secondary | ICD-10-CM

## 2018-06-25 DIAGNOSIS — F32A Depression, unspecified: Secondary | ICD-10-CM

## 2018-06-25 DIAGNOSIS — Z9114 Patient's other noncompliance with medication regimen: Secondary | ICD-10-CM

## 2018-06-25 DIAGNOSIS — J4521 Mild intermittent asthma with (acute) exacerbation: Secondary | ICD-10-CM

## 2018-06-25 DIAGNOSIS — Z550 Illiteracy and low-level literacy: Secondary | ICD-10-CM

## 2018-06-25 DIAGNOSIS — F329 Major depressive disorder, single episode, unspecified: Secondary | ICD-10-CM

## 2018-06-28 ENCOUNTER — Telehealth: Payer: Self-pay | Admitting: *Deleted

## 2018-06-28 ENCOUNTER — Other Ambulatory Visit: Payer: Medicare Other

## 2018-06-28 DIAGNOSIS — Z113 Encounter for screening for infections with a predominantly sexual mode of transmission: Secondary | ICD-10-CM

## 2018-06-28 DIAGNOSIS — B20 Human immunodeficiency virus [HIV] disease: Secondary | ICD-10-CM

## 2018-06-28 NOTE — Telephone Encounter (Signed)
I agree with this plan of care 

## 2018-06-28 NOTE — Telephone Encounter (Signed)
Current Orders Effective 07/12/18  Individualized Plan Of Care, Certification Period of  to 07/12/18 to 10/10/2018 a. Type of service(s) and care to be delivered: [facility] Nurse   Frequency and duration of service: Effective: 07/12/18 62mo3, 3PRN's for complications with disease process/progression, medication changes or concerns b. Activity restrictions: Pt may be up as tolerated and can safely ambulate without the need for a assistive device. Pt wears glasses c. Safety Measures: Standard Precautions/Infection Control d. Service Objectives and Goals:Service Objectives are to assist the pt with HIV medication regimen adherence and staying in care with the Infectious Disease Clinic by identifying barriers to care. RN will address the barriers that are identified by the patient.   Patient continues to have unmet goals with adherence questionable. He continues to struggle with eating regularly and consistent adherence to medications. Patient centered goal is to stay in care, manage his HIV and have affordable housing. My goal is to bridge Mr Lile with [facility] Case Management and a possible discharge from my services.   e. Equipment required: No additional equipment needs at this time f. Functional Limitations: Vision. Pt has corrective glasses that he wears g. Rehabilitation potential: Guarded h. Diet and Nutritional Needs: Regular Diet i. Medications and treatments: Medications have been reconciled and reviewed and are a part of EPIC electronic file j. Specific therapies if needed: RN k. Pertinent diagnoses: Hypertension, Asthma, Bladder Cancer with Chemo treatment, Depression l. Expected outcome: Guarded

## 2018-06-29 LAB — T-HELPER CELL (CD4) - (RCID CLINIC ONLY)
CD4 % Helper T Cell: 45 % (ref 33–55)
CD4 T Cell Abs: 630 /uL (ref 400–2700)

## 2018-06-30 ENCOUNTER — Other Ambulatory Visit: Payer: Self-pay

## 2018-06-30 ENCOUNTER — Ambulatory Visit (INDEPENDENT_AMBULATORY_CARE_PROVIDER_SITE_OTHER): Payer: Medicare Other | Admitting: Family Medicine

## 2018-06-30 VITALS — BP 141/55 | HR 75 | Temp 97.5°F | Resp 18 | Ht 64.0 in | Wt 122.0 lb

## 2018-06-30 DIAGNOSIS — J0101 Acute recurrent maxillary sinusitis: Secondary | ICD-10-CM

## 2018-06-30 DIAGNOSIS — J45901 Unspecified asthma with (acute) exacerbation: Secondary | ICD-10-CM

## 2018-06-30 DIAGNOSIS — J441 Chronic obstructive pulmonary disease with (acute) exacerbation: Secondary | ICD-10-CM

## 2018-06-30 LAB — CBC WITH DIFFERENTIAL/PLATELET
BASOS PCT: 0.8 %
Basophils Absolute: 19 cells/uL (ref 0–200)
EOS ABS: 139 {cells}/uL (ref 15–500)
Eosinophils Relative: 5.8 %
HEMATOCRIT: 38.8 % (ref 38.5–50.0)
Hemoglobin: 13.4 g/dL (ref 13.2–17.1)
LYMPHS ABS: 1294 {cells}/uL (ref 850–3900)
MCH: 33.7 pg — AB (ref 27.0–33.0)
MCHC: 34.5 g/dL (ref 32.0–36.0)
MCV: 97.5 fL (ref 80.0–100.0)
MPV: 10 fL (ref 7.5–12.5)
Monocytes Relative: 9.1 %
Neutro Abs: 730 cells/uL — ABNORMAL LOW (ref 1500–7800)
Neutrophils Relative %: 30.4 %
Platelets: 152 10*3/uL (ref 140–400)
RBC: 3.98 10*6/uL — AB (ref 4.20–5.80)
RDW: 12.2 % (ref 11.0–15.0)
TOTAL LYMPHOCYTE: 53.9 %
WBC: 2.4 10*3/uL — ABNORMAL LOW (ref 3.8–10.8)
WBCMIX: 218 {cells}/uL (ref 200–950)

## 2018-06-30 LAB — COMPLETE METABOLIC PANEL WITH GFR
AG Ratio: 0.8 (calc) — ABNORMAL LOW (ref 1.0–2.5)
ALBUMIN MSPROF: 4.2 g/dL (ref 3.6–5.1)
ALT: 12 U/L (ref 9–46)
AST: 43 U/L — ABNORMAL HIGH (ref 10–35)
Alkaline phosphatase (APISO): 146 U/L — ABNORMAL HIGH (ref 40–115)
BILIRUBIN TOTAL: 0.3 mg/dL (ref 0.2–1.2)
BUN: 9 mg/dL (ref 7–25)
CHLORIDE: 96 mmol/L — AB (ref 98–110)
CO2: 22 mmol/L (ref 20–32)
CREATININE: 0.97 mg/dL (ref 0.70–1.25)
Calcium: 9.4 mg/dL (ref 8.6–10.3)
GFR, Est African American: 95 mL/min/{1.73_m2} (ref >=60)
GFR, Est Non African American: 82 mL/min/{1.73_m2} (ref >=60)
GLUCOSE: 96 mg/dL (ref 65–99)
Globulin: 5 g/dL (calc) — ABNORMAL HIGH (ref 1.9–3.7)
Potassium: 4.9 mmol/L (ref 3.5–5.3)
Sodium: 125 mmol/L — ABNORMAL LOW (ref 135–146)
TOTAL PROTEIN: 9.2 g/dL — AB (ref 6.1–8.1)

## 2018-06-30 LAB — HIV-1 RNA QUANT-NO REFLEX-BLD
HIV 1 RNA Quant: 928 copies/mL — ABNORMAL HIGH
HIV-1 RNA QUANT, LOG: 2.97 {Log_copies}/mL — AB

## 2018-06-30 LAB — RPR: RPR: NONREACTIVE

## 2018-06-30 MED ORDER — ALBUTEROL SULFATE (2.5 MG/3ML) 0.083% IN NEBU
2.5000 mg | INHALATION_SOLUTION | Freq: Once | RESPIRATORY_TRACT | Status: AC
  Administered 2018-06-30: 2.5 mg via RESPIRATORY_TRACT

## 2018-06-30 MED ORDER — METHYLPREDNISOLONE 4 MG PO TBPK: ORAL_TABLET | ORAL | 0 refills | Status: DC

## 2018-06-30 MED ORDER — IPRATROPIUM-ALBUTEROL 0.5-2.5 (3) MG/3ML IN SOLN: 3.0000 mL | Freq: Four times a day (QID) | RESPIRATORY_TRACT | 11 refills | Status: DC | PRN

## 2018-06-30 MED ORDER — IPRATROPIUM BROMIDE 0.02 % IN SOLN
0.5000 mg | Freq: Once | RESPIRATORY_TRACT | Status: AC
  Administered 2018-06-30: 0.5 mg via RESPIRATORY_TRACT

## 2018-06-30 MED ORDER — AMOXICILLIN-POT CLAVULANATE 875-125 MG PO TABS: 1.0000 | ORAL_TABLET | Freq: Two times a day (BID) | ORAL | 0 refills | Status: DC

## 2018-06-30 NOTE — Progress Notes (Signed)
  Patient [facility]   Progress Note: Sick Visit Provider: Lanae Boast, FNP  SUBJECTIVE:   [name] is a 62 y.o. male who  has a past medical history of Asthma with COPD ([place]), Bladder cancer [facility]), Bladder tumor, History of transfusion, HIV disease ([place]), Hypertension, and Nocturia.. Patient presents today for Nasal Congestion  Patient presents with cough, congestion and runny nose x 2 weeks. Patient denies fever. + chills. Denies taking medications. Yellow sputum Review of Systems  Constitutional: Negative.   HENT: Negative.   Eyes: Negative.   Respiratory: Positive for cough, sputum production and shortness of breath.   Cardiovascular: Negative.   Gastrointestinal: Negative.   Genitourinary: Negative.   Musculoskeletal: Negative.   Skin: Negative.   Neurological: Negative.   Psychiatric/Behavioral: Negative.      OBJECTIVE: BP (!) 141/55 (BP Location: Left Arm, Patient Position: Sitting, Cuff Size: Normal)   Pulse 75   Temp (!) 97.5 F (36.4 C) (Oral)   Resp 18   Ht 5\' 4"  (1.626 m)   Wt 122 lb (55.3 kg)   SpO2 100%   BMI 20.94 kg/m   Wt Readings from Last 3 Encounters:  06/30/18 122 lb (55.3 kg)  05/28/18 123 lb (55.8 kg)  03/05/18 121 lb (54.9 kg)     Physical Exam  Constitutional: He is oriented to person, place, and time. He appears well-developed and well-nourished. No distress.  HENT:  Head: Normocephalic and atraumatic.  Nose: Right sinus exhibits frontal sinus tenderness. Left sinus exhibits frontal sinus tenderness.  Eyes: Pupils are equal, round, and reactive to light. Conjunctivae and EOM are normal.  Neck: Normal range of motion.  Cardiovascular: Normal rate, regular rhythm, normal heart sounds and intact distal pulses.  Pulmonary/Chest: Effort normal. No respiratory distress. He has wheezes.  Abdominal: Soft. Bowel sounds are normal. He exhibits no distension.  Musculoskeletal: Normal range of motion.    Neurological: He is alert and oriented to person, place, and time.  Skin: Skin is warm and dry.  Psychiatric: He has a normal mood and affect. His behavior is normal. Thought content normal.  Nursing note and vitals reviewed.   ASSESSMENT/PLAN:  1. Acute recurrent maxillary sinusitis - amoxicillin-clavulanate (AUGMENTIN) 875-125 MG tablet; Take 1 tablet by mouth 2 (two) times daily.  Dispense: 20 tablet; Refill: 0 - methylPREDNISolone (MEDROL DOSEPAK) 4 MG TBPK tablet; Take as directed on pack  Dispense: 21 tablet; Refill: 0  2. Exacerbation of asthma, unspecified asthma severity, unspecified whether persistent - albuterol (PROVENTIL) (2.5 MG/3ML) 0.083% nebulizer solution 2.5 mg - ipratropium (ATROVENT) nebulizer solution 0.5 mg - amoxicillin-clavulanate (AUGMENTIN) 875-125 MG tablet; Take 1 tablet by mouth 2 (two) times daily.  Dispense: 20 tablet; Refill: 0 - methylPREDNISolone (MEDROL DOSEPAK) 4 MG TBPK tablet; Take as directed on pack  Dispense: 21 tablet; Refill: 0          The patient was given clear instructions to go to ER or return to medical center if symptoms do not improve, worsen or new problems develop. The patient verbalized understanding and agreed with plan of care.   Ms. Doug Sou. Nathaneil Canary, FNP-BC Patient [facility] [address] [place], [place] [address] [phone]     This note has been created with Dragon speech recognition software and smart phrase technology. Any transcriptional errors are unintentional.

## 2018-06-30 NOTE — Patient Instructions (Signed)
Sinusitis, Adult Sinusitis is soreness and inflammation of your sinuses. Sinuses are hollow spaces in the bones around your face. They are located:  Around your eyes.  In the middle of your forehead.  Behind your nose.  In your cheekbones.  Your sinuses and nasal passages are lined with a stringy fluid (mucus). Mucus normally drains out of your sinuses. When your nasal tissues get inflamed or swollen, the mucus can get trapped or blocked so air cannot flow through your sinuses. This lets bacteria, viruses, and funguses grow, and that leads to infection. Follow these instructions at home: Medicines  Take, use, or apply over-the-counter and prescription medicines only as told by your doctor. These may include nasal sprays.  If you were prescribed an antibiotic medicine, take it as told by your doctor. Do not stop taking the antibiotic even if you start to feel better. Hydrate and Humidify  Drink enough water to keep your pee (urine) clear or pale yellow.  Use a cool mist humidifier to keep the humidity level in your home above 50%.  Breathe in steam for 10-15 minutes, 3-4 times a day or as told by your doctor. You can do this in the bathroom while a hot shower is running.  Try not to spend time in cool or dry air. Rest  Rest as much as possible.  Sleep with your head raised (elevated).  Make sure to get enough sleep each night. General instructions  Put a warm, moist washcloth on your face 3-4 times a day or as told by your doctor. This will help with discomfort.  Wash your hands often with soap and water. If there is no soap and water, use hand sanitizer.  Do not smoke. Avoid being around people who are smoking (secondhand smoke).  Keep all follow-up visits as told by your doctor. This is important. Contact a doctor if:  You have a fever.  Your symptoms get worse.  Your symptoms do not get better within 10 days. Get help right away if:  You have a very bad  headache.  You cannot stop throwing up (vomiting).  You have pain or swelling around your face or eyes.  You have trouble seeing.  You feel confused.  Your neck is stiff.  You have trouble breathing. This information is not intended to replace advice given to you by your health care provider. Make sure you discuss any questions you have with your health care provider. Document Released: 01/07/2008 Document Revised: 03/16/2016 Document Reviewed: 05/16/2015 Elsevier Interactive Patient Education  2018 [employer]. Acute Bronchitis, Adult Acute bronchitis is when air tubes (bronchi) in the lungs suddenly get swollen. The condition can make it hard to breathe. It can also cause these symptoms:  A cough.  Coughing up clear, yellow, or green mucus.  Wheezing.  Chest congestion.  Shortness of breath.  A fever.  Body aches.  Chills.  A sore throat.  Follow these instructions at home: Medicines  Take over-the-counter and prescription medicines only as told by your doctor.  If you were prescribed an antibiotic medicine, take it as told by your doctor. Do not stop taking the antibiotic even if you start to feel better. General instructions  Rest.  Drink enough fluids to keep your pee (urine) clear or pale yellow.  Avoid smoking and secondhand smoke. If you smoke and you need help quitting, ask your doctor. Quitting will help your lungs heal faster.  Use an inhaler, cool mist vaporizer, or humidifier as told by your  doctor.  Keep all follow-up visits as told by your doctor. This is important. How is this prevented? To lower your risk of getting this condition again:  Wash your hands often with soap and water. If you cannot use soap and water, use hand sanitizer.  Avoid contact with people who have cold symptoms.  Try not to touch your hands to your mouth, nose, or eyes.  Make sure to get the flu shot every year.  Contact a doctor if:  Your symptoms do not get  better in 2 weeks. Get help right away if:  You cough up blood.  You have chest pain.  You have very bad shortness of breath.  You become dehydrated.  You faint (pass out) or keep feeling like you are going to pass out.  You keep throwing up (vomiting).  You have a very bad headache.  Your fever or chills gets worse. This information is not intended to replace advice given to you by your health care provider. Make sure you discuss any questions you have with your health care provider. Document Released: 01/07/2008 Document Revised: 02/27/2016 Document Reviewed: 01/09/2016 Elsevier Interactive Patient Education  [employer].

## 2018-07-05 ENCOUNTER — Telehealth: Payer: Self-pay | Admitting: *Deleted

## 2018-07-05 NOTE — Telephone Encounter (Signed)
Transportation scheduled for 12/13 appt with Medicaid transportatin. I have already requested transportation for [name] 12/10 appt

## 2018-07-05 NOTE — Telephone Encounter (Signed)
Received a call from Mr Wickliff asking me if  I would arrange transportation for a upcoming appt on the 13th. At this time we discussed his medication adherence. Explained to Mr Dorton that his virus has gone up and we want to get it back down. Mr Batrez was concerned and stated he had missed a couple of days but did not think it would affect him. Mr Weyer stated he does not want to get sick and will begin to take his medications each day. Mr Gripp appears a little down today and may need some additional encouragement and support.

## 2018-07-06 MED ORDER — METHYLPREDNISOLONE 4 MG PO TBPK: ORAL_TABLET | ORAL | 0 refills | Status: DC

## 2018-07-06 MED ORDER — AMOXICILLIN-POT CLAVULANATE 875-125 MG PO TABS: 1.0000 | ORAL_TABLET | Freq: Two times a day (BID) | ORAL | 0 refills | Status: DC

## 2018-07-07 ENCOUNTER — Ambulatory Visit: Payer: Medicare Other | Admitting: Gastroenterology

## 2018-07-12 ENCOUNTER — Encounter: Payer: Medicare Other | Admitting: Internal Medicine

## 2018-07-12 ENCOUNTER — Other Ambulatory Visit: Payer: Self-pay | Admitting: Pharmacist

## 2018-07-12 DIAGNOSIS — J45909 Unspecified asthma, uncomplicated: Secondary | ICD-10-CM

## 2018-07-12 MED FILL — SYMTUZA 800-150-200-10 MG T: 800-150-200 | 30 days supply | Qty: 30 | Fill #1

## 2018-07-13 ENCOUNTER — Ambulatory Visit (INDEPENDENT_AMBULATORY_CARE_PROVIDER_SITE_OTHER): Payer: Medicare Other | Admitting: Internal Medicine

## 2018-07-13 ENCOUNTER — Encounter: Payer: Self-pay | Admitting: Internal Medicine

## 2018-07-13 DIAGNOSIS — Z8639 Personal history of other endocrine, nutritional and metabolic disease: Secondary | ICD-10-CM

## 2018-07-13 DIAGNOSIS — Z720 Tobacco use: Secondary | ICD-10-CM | POA: Diagnosis not present

## 2018-07-13 DIAGNOSIS — Z9114 Patient's other noncompliance with medication regimen: Secondary | ICD-10-CM | POA: Diagnosis not present

## 2018-07-13 DIAGNOSIS — B20 Human immunodeficiency virus [HIV] disease: Secondary | ICD-10-CM

## 2018-07-13 DIAGNOSIS — Z79899 Other long term (current) drug therapy: Secondary | ICD-10-CM

## 2018-07-13 DIAGNOSIS — Z886 Allergy status to analgesic agent status: Secondary | ICD-10-CM

## 2018-07-13 DIAGNOSIS — F329 Major depressive disorder, single episode, unspecified: Secondary | ICD-10-CM

## 2018-07-13 DIAGNOSIS — R630 Anorexia: Secondary | ICD-10-CM

## 2018-07-13 DIAGNOSIS — F3342 Major depressive disorder, recurrent, in full remission: Secondary | ICD-10-CM

## 2018-07-13 NOTE — Assessment & Plan Note (Signed)
His depression is in remission. 

## 2018-07-13 NOTE — Progress Notes (Addendum)
Patient Active Problem List   Diagnosis Date Noted  . HIV disease ([place]) 04/18/2015    Priority: High  . Dysuria 05/29/2015    Priority: Medium  . Depression 04/19/2015    Priority: Medium  . 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 Malignant neoplasm of bladder ([place]) 03/30/2015    Priority: Medium  . Abdominal pain 02/19/2017  . Exacerbation of asthma 09/09/2016  . Acute respiratory failure with hypoxia ([place]) 09/09/2016  . Unintentional weight loss 07/01/2016  . Anorexia 03/11/2016  . Syncope 10/31/2015  . HTN (hypertension) 03/30/2015  . Asthma, chronic 03/30/2015    Patient's Medications  New Prescriptions   No medications on file  Previous Medications   AMLODIPINE (NORVASC) 5 MG TABLET    TAKE 1 TABLET (5 MG TOTAL) BY MOUTH DAILY.   AMOXICILLIN-CLAVULANATE (AUGMENTIN) 875-125 MG TABLET    Take 1 tablet by mouth 2 (two) times daily.   AMOXICILLIN-CLAVULANATE (AUGMENTIN) 875-125 MG TABLET    Take 1 tablet by mouth 2 (two) times daily.   CETIRIZINE (ZYRTEC) 10 MG TABLET    Take 1 tablet (10 mg total) by mouth at bedtime.   ENSURE (ENSURE)    Take 237 mLs by mouth 2 (two) times daily between meals. Decreased Nutritional intake due to Bladder Cancer treatment and uncontrolled HIV   IPRATROPIUM-ALBUTEROL (DUONEB) 0.5-2.5 (3) MG/3ML SOLN    Take 3 mLs by nebulization every 6 (six) hours as needed (wheezing and chest tightness).   MEGESTROL (MEGACE) 400 MG/10ML SUSPENSION    Take 10 mLs (400 mg total) by mouth daily.   METHYLPREDNISOLONE (MEDROL DOSEPAK) 4 MG TBPK TABLET    Take as directed on pack   METHYLPREDNISOLONE (MEDROL DOSEPAK) 4 MG TBPK TABLET    Take as directed on pack   ONDANSETRON (ZOFRAN ODT) 8 MG DISINTEGRATING TABLET    Take 1 tablet (8 mg total) by mouth daily. Take after you eat and then 30 minutes later take your Symtuza.   RESPIRATORY THERAPY SUPPLIES (NEBULIZER/TUBING/MOUTHPIECE) KIT    1 Units by Does not apply route  daily.   SYMTUZA 800-150-200-10 MG TABS    TAKE 1 TABLET BY MOUTH DAILY WITH BREAKFAST.   VENTOLIN HFA 108 (90 BASE) MCG/ACT INHALER    INHALE 2 PUFFS BY MOUTH INTO THE LUNGS EVERY 6 HOURS AS NEEDED FOR WHEEZING OR SHORTNESS OF BREATH  Modified Medications   No medications on file  Discontinued Medications   No medications on file    Subjective: [name] is in for his routine HIV follow-up visit.  He denies any problems obtaining, taking or tolerating his Symtuza.  Initially he said that he did not think he had been missing many doses.  He then told me that he tries to take it before bedtime because he thinks that it makes him sleepy.  He says that he has been more sleepy lately and often falls asleep in a chair or falls asleep in his bed before taking it.  He has not been having any further problems with nausea or vomiting.  He states that his appetite is good.  He is not having any dysuria.  He thinks he has a follow-up visit with Dr. Louis Meckel, his urologist, next month.  Review of Systems: Review of Systems  Constitutional: Negative for chills, diaphoresis, fever and weight loss.  Respiratory: Negative for cough, sputum production and shortness of breath.   Cardiovascular: Negative for chest pain.  Gastrointestinal: Negative for abdominal pain, diarrhea, nausea and vomiting.  Genitourinary: Negative for dysuria, frequency and urgency.  Musculoskeletal: Negative for back pain and myalgias.  Neurological: Negative for headaches.  Psychiatric/Behavioral: Negative for depression. The patient is not nervous/anxious.     Past Medical History:  Diagnosis Date  . Asthma with COPD ([place])   . Bladder cancer ([facility])    and prostatic urethra cancer  HX TURBT'S IN [place], [place]  WITH INSTILLATION CHEMO TX'S  . Bladder tumor   . History of transfusion   . HIV disease ([place])    under care of [facility]  . Hypertension   . Nocturia     Social History   Tobacco Use  .  Smoking status: Current Every Day Smoker    Packs/day: 0.10    Years: 17.00    Pack years: 1.70    Types: Cigarettes  . Smokeless tobacco: Never Used  . Tobacco comment: 3/day; looking into vaping instead  Substance Use Topics  . Alcohol use: Yes    Alcohol/week: 6.0 standard drinks    Types: 6 Cans of beer per week    Comment: occasional  . Drug use: No    Family History  Problem Relation Age of Onset  . Cancer Mother        Gastric Cancer  . Hypertension Mother   . Heart disease Mother   . Cancer Maternal Grandmother        breast cancer     Allergies  Allergen Reactions  . Aspirin Shortness Of Breath, Nausea And Vomiting and Swelling    Health Maintenance  Topic Date Due  . COLONOSCOPY  10/05/2003  . TETANUS/TDAP  04/02/2027 (Originally 10/04/1974)  . INFLUENZA VACCINE  Completed  . Hepatitis C Screening  Completed  . HIV Screening  Completed    Objective:  Vitals:   07/13/18 1115  BP: (!) 153/85  Pulse: 85  Temp: 98.1 F (36.7 C)  Weight: 123 lb (55.8 kg)   Body mass index is 21.11 kg/m.  Physical Exam  Constitutional: He is oriented to person, place, and time.  He is in good spirits.  His weight is unchanged.  HENT:  Mouth/Throat: No oropharyngeal exudate.  Eyes: Conjunctivae are normal.  Cardiovascular: Normal rate, regular rhythm and normal heart sounds.  No murmur heard. Pulmonary/Chest: Effort normal and breath sounds normal.  Abdominal: Soft. He exhibits no mass. There is no tenderness.  Musculoskeletal: Normal range of motion.  Neurological: He is alert and oriented to person, place, and time.  Skin: No rash noted.  Psychiatric: He has a normal mood and affect.    Lab Results Lab Results  Component Value Date   WBC 2.4 (L) 06/28/2018   HGB 13.4 06/28/2018   HCT 38.8 06/28/2018   MCV 97.5 06/28/2018   PLT 152 06/28/2018    Lab Results  Component Value Date   CREATININE 0.97 06/28/2018   BUN 9 06/28/2018   NA 125 (L) 06/28/2018     K 4.9 06/28/2018   CL 96 (L) 06/28/2018   CO2 22 06/28/2018    Lab Results  Component Value Date   ALT 12 06/28/2018   AST 43 (H) 06/28/2018   ALKPHOS 104 04/01/2017   BILITOT 0.3 06/28/2018    Lab Results  Component Value Date   CHOL 129 10/17/2015   HDL 92 10/17/2015   LDLCALC 28 10/17/2015   TRIG 43 10/17/2015   CHOLHDL 1.4 10/17/2015   Lab Results  Component  Value Date   LABRPR NON-REACTIVE 06/28/2018   HIV 1 RNA Quant (copies/mL)  Date Value  06/28/2018 928 (H)  03/02/2018 89 (H)  11/05/2017 38 (H)   CD4 T Cell Abs (/uL)  Date Value  06/28/2018 630  03/02/2018 540  07/29/2017 620     Problem List Items Addressed This Visit      High   HIV disease ([place])    He has not had good, consistent viral suppression in the last year and a half.  Have continued concerns about difficulties with adherence.  I suggested that he take Symtuza earlier in the day (when he is home) and to set his alarm to remind him.  He has been working with our Facilities manager, [employer].  I will ask her to follow-up with him.  He will get lab work today including viral load and resistance assays and follow-up in 6 weeks.      Relevant Orders   HIV-1 RNA quant-no reflex-bld   HIV-1 genotypr plus   HIV-1 Integrase Genotype     Medium   Depression    His depression is in remission.        Unprioritized   Anorexia    His nausea, vomiting, anorexia and unintentional weight loss seems to have spontaneously resolved.           Michel Bickers, MD [facility] [phone] pager   [phone] cell 07/13/2018, 11:34 AM

## 2018-07-13 NOTE — Assessment & Plan Note (Signed)
He has not had good, consistent viral suppression in the last year and a half.  Have continued concerns about difficulties with adherence.  I suggested that he take Symtuza earlier in the day (when he is home) and to set his alarm to remind him.  He has been working with our Facilities manager, [employer].  I will ask her to follow-up with him.  He will get lab work today including viral load and resistance assays and follow-up in 6 weeks.

## 2018-07-13 NOTE — Assessment & Plan Note (Signed)
His nausea, vomiting, anorexia and unintentional weight loss seems to have spontaneously resolved.

## 2018-07-14 MED FILL — VENTOLIN HFA 90 MCG INHALER: 108 (90 BAS | 25 days supply | Qty: 18 | Fill #0

## 2018-07-16 ENCOUNTER — Ambulatory Visit: Payer: Medicare Other | Admitting: Family Medicine

## 2018-07-16 ENCOUNTER — Telehealth: Payer: Self-pay | Admitting: *Deleted

## 2018-07-16 NOTE — Telephone Encounter (Signed)
Contacted [name] today and offered a home visit. [name] declined stating he had an appt today that he will not be able to keep. His refrigerator stopped working and he must be present for the repair man. [name] asked that I give him a call next week.

## 2018-07-19 NOTE — Progress Notes (Signed)
Home visit made today with focus on medication adherence, maintaining appts and smoking cessation. [name] denies and concerns and while discussing smoking cessation he states that he needs to enjoy life. [name] stated with all his health concerns he enjoys smoking a cigarette to relax and doesn't want anyone to take this joy away from him. My Yo states he has missed some days with taking his medications but will get back on track. [facility] transportation was called and transportation arranged for all upcoming appts.

## 2018-07-19 NOTE — Progress Notes (Signed)
Home visit made with [name] today and his primary concern was SOB. [name] denies any chest pain but states he is out of his nebulizer treatments due to lack of money. Vitals have been assessed during this visit along with O2 saturation which is all within normal limits for the patient. RN educated [name] that a goal he may want to consider is reducing his cigarette smoking. Plan at this time is to travel to [facility] and pay the copay for [name].

## 2018-07-23 ENCOUNTER — Ambulatory Visit: Payer: Self-pay | Admitting: *Deleted

## 2018-07-23 DIAGNOSIS — B20 Human immunodeficiency virus [HIV] disease: Secondary | ICD-10-CM

## 2018-07-23 DIAGNOSIS — J45909 Unspecified asthma, uncomplicated: Secondary | ICD-10-CM

## 2018-07-23 DIAGNOSIS — Z9114 Patient's other noncompliance with medication regimen: Secondary | ICD-10-CM

## 2018-07-23 DIAGNOSIS — R0602 Shortness of breath: Secondary | ICD-10-CM

## 2018-07-25 LAB — HIV-1 INTEGRASE GENOTYPE: Date Viral Load Collected: 12102019

## 2018-07-25 LAB — HIV-1 RNA QUANT-NO REFLEX-BLD
HIV 1 RNA QUANT: 960 {copies}/mL — AB
HIV-1 RNA Quant, Log: 2.98 Log copies/mL — ABNORMAL HIGH

## 2018-07-25 LAB — HIV-1 GENOTYPE: HIV-1 Genotype: DETECTED — AB

## 2018-08-06 MED FILL — SYMTUZA 800-150-200-10 MG T: 800-150-200 | 30 days supply | Qty: 30 | Fill #2

## 2018-08-06 MED FILL — VENTOLIN HFA 90 MCG INHALER: 108 (90 BAS | 25 days supply | Qty: 18 | Fill #1

## 2018-08-30 ENCOUNTER — Ambulatory Visit: Payer: Medicare Other | Admitting: Family Medicine

## 2018-08-31 ENCOUNTER — Ambulatory Visit: Payer: Medicare Other | Admitting: Internal Medicine

## 2018-09-03 NOTE — Progress Notes (Signed)
Focus of today's visit is organizing upcoming appts, physical assessment of general health, and medication adherence. Organized upcoming appts  for Mr Dyas and updated his personal calendar to include appts with [name], PCP and Dr Louis Meckel. Currently Mr Effertz is symptomatic with his asthma but is unable to afford his inhaler treatment that was prescribed. RN was able to get the cost of the medications covered along with the nebulizer attachments. Picked the meds/supplies up for the patient and education provided on how to set the machine up and administer the medications. Time was spent today discussing with Mr Gill about smoke cessation or reduction. Educated Mr Caggiano on how smoking aggravates his asthma and increased his risk causing more damage to his lungs. Mr Arguijo verbalized understanding but is not ready to stop smoking.

## 2018-09-06 ENCOUNTER — Telehealth: Payer: Self-pay | Admitting: Pharmacy Technician

## 2018-09-06 MED FILL — VENTOLIN HFA 90 MCG INHALER: 108 (90 BAS | 25 days supply | Qty: 18 | Fill #2

## 2018-09-06 MED FILL — SYMTUZA 800-150-200-10 MG T: 800-150-200 | 30 days supply | Qty: 30 | Fill #3

## 2018-09-06 NOTE — Telephone Encounter (Signed)
[name] Patient Advocate Encounter   I was successful in securing patient a $7500 grant from [employer] (PAF) to provide copayment coverage for Symtuza.  This will make the out of pocket cost $0.     I have spoken with the pharmacy, [facility]  The billing information is as follows and has been shared with [place].   RxBin: [id number] PCN: [id number] Member ID: [id number]  Group ID: [id number] Dates of Eligibility: 09/06/2018 through 09/07/2019  Patient knows to call the office with questions or concerns.  Venida Jarvis. Nadara Mustard [place] Patient [facility] for Infectious Disease Phone: [phone] Fax:  [phone]

## 2018-09-24 ENCOUNTER — Telehealth: Payer: Self-pay | Admitting: *Deleted

## 2018-09-24 NOTE — Telephone Encounter (Signed)
Contacted Mr Brander as a follow up. Mr Cowin stating he is doing well but did have a episode recently. We discussed his recent missed visits with [name] and the PCP. He stated he completely forgot. I asked Mr Brindisi if we can arrange a home visit for today so I can assess his health and reschedule his visits. He declined an appt for today but stated I could come tomorrow. Visit scheduled for tomorrow. At that time I will do a complete nursing assessment and reschedule his MD appts.

## 2018-09-25 ENCOUNTER — Ambulatory Visit: Payer: Self-pay | Admitting: *Deleted

## 2018-09-25 VITALS — BP 158/58

## 2018-09-25 DIAGNOSIS — B20 Human immunodeficiency virus [HIV] disease: Secondary | ICD-10-CM

## 2018-09-25 DIAGNOSIS — J449 Chronic obstructive pulmonary disease, unspecified: Secondary | ICD-10-CM

## 2018-09-27 NOTE — Progress Notes (Signed)
Home visit made with [name] today. He states he has been feeling a little bad recently with what he believes is a cold. [name] states he has a cough, runny nose and coughs up mucous especially in the morning. He describes the mucous as thick and yellow. He denies any SOB but states he does have right sided pain and describes is by saying "if feels like something is pulling in my lungs and side". He denies a fever, chills or night sweats.   On assessement vitals are unremarkable with O2 saturation at 98% RA. HR has a regular rate and rhythm, Lungs sounds clear on the left,  Positive for expiratory fine crackles at the base on the right. Instructed [name] to take a deep cough, crackles were still noted on the right. Instructed [name] to administer his Duoneb nebulizer treatments every 6hrs as ordered during the day and to use your rescue inhaler if needed. Explained to [name] that his breathing treatments may help to loosen up his congestion and allows him to cough it up. Encouraged [name] to reduce his cigarette smoking since smoking works against the nebulizer treatments. Instructed [name] to drink plenty of fluids as well. I will follow up with him on again on Monday.

## 2018-09-28 NOTE — Progress Notes (Signed)
Most definitely! Thnanks

## 2018-10-01 ENCOUNTER — Telehealth: Payer: Self-pay | Admitting: *Deleted

## 2018-10-01 MED FILL — SYMTUZA 800-150-200-10 MG T: 800-150-200 | 30 days supply | Qty: 30 | Fill #4

## 2018-10-01 MED FILL — VENTOLIN HFA 90 MCG INHALER: 108 (90 BAS | 25 days supply | Qty: 18 | Fill #3

## 2018-10-01 NOTE — Telephone Encounter (Signed)
I approve this care plan and orders.

## 2018-10-01 NOTE — Telephone Encounter (Signed)
Current Orders Effective 10/11/18  Individualized Plan Of Care, Certification Period of  to 10/10/17 to 01/09/2019 a. Type of service(s) and care to be delivered: [facility] Nurse   Frequency and duration of service: Effective: 10/11/18: 3mo3, 3PRN's for complications with disease process/progression, medication changes or concerns b. Activity restrictions: Pt may be up as tolerated and can safely ambulate without the need for a assistive device. Pt wears glasses c. Safety Measures: Standard Precautions/Infection Control d. Service Objectives and Goals:Service Objectives are to assist the pt with HIV medication regimen adherence and staying in care with the Infectious Disease Clinic by identifying barriers to care. RN will address the barriers that are identified by the patient.   Patient continues to have unmet goals with adherence questionable. He continues to struggle with eating regularly and consistent adherence to medications. Patient centered goal is to stay in care, manage his HIV and have affordable housing. My goal is to bridge Mr Zawislak with [facility] Case Management and a possible discharge from my services.   e. Equipment required: No additional equipment needs at this time f. Functional Limitations: Vision. Pt has corrective glasses that he wears g. Rehabilitation potential: Guarded h. Diet and Nutritional Needs: Regular Diet i. Medications and treatments: Medications have been reconciled and reviewed and are a part of EPIC electronic file j. Specific therapies if needed: RN k. Pertinent diagnoses: Hypertension, Asthma, Bladder Cancer with Chemo treatment, Depression l. Expected outcome: Guarded

## 2018-10-25 MED FILL — SYMTUZA 800-150-200-10 MG T: 800-150-200 | 30 days supply | Qty: 30 | Fill #5

## 2018-10-25 MED FILL — VENTOLIN HFA 90 MCG INHALER: 108 (90 BAS | 25 days supply | Qty: 18 | Fill #4

## 2018-10-28 ENCOUNTER — Other Ambulatory Visit: Payer: Self-pay

## 2018-10-28 ENCOUNTER — Ambulatory Visit: Payer: Self-pay | Admitting: *Deleted

## 2018-10-28 ENCOUNTER — Telehealth: Payer: Self-pay | Admitting: *Deleted

## 2018-10-28 VITALS — BP 148/76 | HR 76 | Temp 96.0°F

## 2018-10-28 DIAGNOSIS — Z9114 Patient's other noncompliance with medication regimen: Secondary | ICD-10-CM

## 2018-10-28 DIAGNOSIS — Z550 Illiteracy and low-level literacy: Secondary | ICD-10-CM

## 2018-10-28 DIAGNOSIS — F329 Major depressive disorder, single episode, unspecified: Secondary | ICD-10-CM

## 2018-10-28 DIAGNOSIS — C674 Malignant neoplasm of posterior wall of bladder: Secondary | ICD-10-CM

## 2018-10-28 DIAGNOSIS — J4521 Mild intermittent asthma with (acute) exacerbation: Secondary | ICD-10-CM

## 2018-10-28 DIAGNOSIS — B20 Human immunodeficiency virus [HIV] disease: Secondary | ICD-10-CM

## 2018-10-28 DIAGNOSIS — J449 Chronic obstructive pulmonary disease, unspecified: Secondary | ICD-10-CM

## 2018-10-28 DIAGNOSIS — F32A Depression, unspecified: Secondary | ICD-10-CM

## 2018-10-28 DIAGNOSIS — Z91148 Patient's other noncompliance with medication regimen for other reason: Secondary | ICD-10-CM

## 2018-10-28 NOTE — Progress Notes (Signed)
Call made to Mr. Reiling today to reassess his needs and health. Mr Marland stated he is feeling well. Denies any SOB, Increased muscle aches, fever or feelings of malaise. He does report that he would like to stay in home as ordered but he has not been able to locate any meats. I offered assistance at this time. Mr Mauger has several co-morbidities such as HIV with a detectable viral load, HTN, COPD, Asthma and Bladder Cancer. It is imperative that he follows the State ordered guidelines of staying home and he needs food to take with his medications. Visit scheduled at this time. Prior to visit RN traveled to [employer] and [employer] and supplies for the patient. Traveled to the home, prior to entering the home RN contacted Mr Minion asked the [place] specific questions to assess his risk factors. Mr Postle denies any symptoms, has not left [place] and has not been around anyone with any symptoms related to COVID 19 or traveled outside of [place]. Home visit made at this time along with a nursing assessment. System assessment is benign with the exception of expiratory wheezing (patient is a heavy smoker with COPD and Asthma) which is typical for the patient. Food delivered at this time with COVID Education provided. Education to include COVID19 (CDC Fact Sheet) A virus, that we currently do not have a vaccine for . The best way to prevent illness is to avoid being exposed to this virus. . The virus is thought to spread mainly from person-to-person.  o Between people who are in close contact with one another (within about 6 feet). o Through respiratory droplets produced when an infected person coughs or sneezes. . These droplets can land in the mouths or noses of people who are nearby or possibly be inhaled into the lungs. 1. Clean your hands often . Wash your hands often with soap and water for at least 20 seconds especially after you have been in a public place, or after blowing your nose, coughing, or sneezing. . If  soap and water are not readily available, use a hand sanitizer that contains at least 60% alcohol. Cover all surfaces of your hands and rub them together until they feel dry. . Avoid touching your eyes, nose, and mouth with unwashed hands. 2. Avoid close contact with people who are sick 3. Put distance between yourself and other people if COVID-19 is spreading in your community. This is especially important for people who are at higher risk of getting very sick. 4. If you become sick stay home and wear a facemask when around people 5. Cover coughs and sneezes . Cover your mouth and nose with a tissue when you cough or sneeze or use the inside of your elbow. . Throw used tissues in the trash. . Immediately wash your hands with soap and water for at least 20 seconds. If soap and water are not readily available, clean your hands with a hand sanitizer that contains at least 60% alcohol  . If you are NOT sick: You do not need to wear a facemask unless you are caring for someone who is sick (and they are not able to wear a facemask). Facemasks may be in short supply and they should be saved for caregivers. Clean and disinfect . Clean AND disinfect frequently touched surfaces daily. This includes tables, doorknobs, light switches, countertops, handles, desks, phones, keyboards, toilets, faucets, and sinks. If surfaces are dirty, clean them: Use detergent or soap and water prior to disinfection.  Mr  Vandevelde verbalized understanding of the education provided

## 2018-11-13 ENCOUNTER — Other Ambulatory Visit: Payer: Self-pay | Admitting: Internal Medicine

## 2018-11-13 DIAGNOSIS — B20 Human immunodeficiency virus [HIV] disease: Secondary | ICD-10-CM

## 2018-11-18 MED FILL — SYMTUZA 800-150-200-10 MG T: 800-150-200 | 30 days supply | Qty: 30 | Fill #0

## 2018-11-24 ENCOUNTER — Ambulatory Visit: Payer: Medicare Other | Admitting: Internal Medicine

## 2018-11-30 ENCOUNTER — Telehealth: Payer: Self-pay | Admitting: *Deleted

## 2018-11-30 NOTE — Telephone Encounter (Signed)
Spoke with [name] today and made him aware that I have arranged transportation for his 12/09/18 appt with [name]. Educated him on the process for utilizing the transportation and information him that he will receive a call with ETA and he must call them back to pick him up after his appt. Mr Wachsmuth has a low literacy and is unable to arrange manage transportation by text.

## 2018-12-08 ENCOUNTER — Telehealth: Payer: Self-pay | Admitting: Internal Medicine

## 2018-12-08 NOTE — Telephone Encounter (Signed)
COVID-19 Pre-Screening Questions: ° °Do you currently have a fever (>100 °F), chills or unexplained body aches? No  ° °Are you currently experiencing new cough, shortness of breath, sore throat, runny nose? No  °•  °Have you recently travelled outside the state of [place] in the last 14 days?no  °•  °Have you been in contact with someone that is currently pending confirmation of Covid19 testing or has been confirmed to have the Covid19 virus?  No  °

## 2018-12-09 ENCOUNTER — Ambulatory Visit: Payer: Medicare Other | Admitting: Internal Medicine

## 2018-12-13 MED FILL — SYMTUZA 800-150-200-10 MG T: 800-150-200 | 30 days supply | Qty: 30 | Fill #1

## 2018-12-31 ENCOUNTER — Encounter: Payer: Self-pay | Admitting: *Deleted

## 2019-01-05 ENCOUNTER — Telehealth: Payer: Self-pay | Admitting: *Deleted

## 2019-01-05 NOTE — Telephone Encounter (Addendum)
Contacted [name] today but unable to get an answer at this time. I would like to assess his current needs and reschedule his visit with Dr [name].  PATIENT ON HOLD  Plan of Care orders have expired effective 01/10/19 but GOALS have not been completely meet at this time. I would like to connect with the patient and received further orders from MD. If I am unable to get in contact with her within the next 30 days I will have to discharge at that time. RN WILL NOT RESUME CARE UNTIL NEW MD ORDERS OBTAINED AND PATIENT ABLE/WILLING TO RE-ENGAGE IN CARE

## 2019-01-10 MED FILL — SYMTUZA 800-150-200-10 MG T: 800-150-200 | 30 days supply | Qty: 30 | Fill #2

## 2019-02-04 ENCOUNTER — Telehealth: Payer: Self-pay | Admitting: *Deleted

## 2019-02-07 ENCOUNTER — Ambulatory Visit: Payer: Self-pay | Admitting: *Deleted

## 2019-02-07 DIAGNOSIS — B20 Human immunodeficiency virus [HIV] disease: Secondary | ICD-10-CM

## 2019-02-07 DIAGNOSIS — Z9114 Patient's other noncompliance with medication regimen: Secondary | ICD-10-CM

## 2019-02-09 ENCOUNTER — Ambulatory Visit: Payer: Self-pay | Admitting: *Deleted

## 2019-02-09 DIAGNOSIS — Z9114 Patient's other noncompliance with medication regimen: Secondary | ICD-10-CM

## 2019-02-09 DIAGNOSIS — B20 Human immunodeficiency virus [HIV] disease: Secondary | ICD-10-CM

## 2019-02-09 NOTE — Telephone Encounter (Signed)
Current Orders Effective 02/04/19  Individualized Plan Of Care, Certification Period of  to 02/03/18 to 05/05/2019 a. Type of service(s) and care to be delivered: [facility] Nurse   Frequency and duration of service: Effective: 02/04/19: 59mo3, 3PRN's for complications with disease process/progression, medication changes or concerns b. Activity restrictions: Pt may be up as tolerated and can safely ambulate without the need for a assistive device. Pt wears glasses c. Safety Measures: Standard Precautions/Infection Control d. Service Objectives and Goals:Service Objectives are to assist the pt with HIV medication regimen adherence and staying in care with the Infectious Disease Clinic by identifying barriers to care. RN will address the barriers that are identified by the patient.   Patient continues to have unmet goals with adherence questionable. He continues to struggle with eating regularly and consistent adherence to medications. Patient centered goal is to stay in care, manage his HIV and have affordable housing. My goal is to bridge Mr Sandoz with [facility] Case Management and a possible discharge from my services.   e. Equipment required: No additional equipment needs at this time f. Functional Limitations: Vision. Pt has corrective glasses that he wears g. Rehabilitation potential: Guarded h. Diet and Nutritional Needs: Regular Diet i. Medications and treatments: Medications have been reconciled and reviewed and are a part of EPIC electronic file j. Specific therapies if needed: RN k. Pertinent diagnoses: Hypertension, Asthma, Bladder Cancer with Chemo treatment, Depression l. Expected outcome: Guarded

## 2019-02-10 MED FILL — SYMTUZA 800-150-200-10 MG T: 800-150-200 | 30 days supply | Qty: 30 | Fill #3

## 2019-02-10 NOTE — Telephone Encounter (Signed)
I approve of this plan of care. 

## 2019-02-14 ENCOUNTER — Ambulatory Visit: Payer: Self-pay | Admitting: *Deleted

## 2019-02-14 DIAGNOSIS — Z550 Illiteracy and low-level literacy: Secondary | ICD-10-CM

## 2019-02-14 DIAGNOSIS — B20 Human immunodeficiency virus [HIV] disease: Secondary | ICD-10-CM

## 2019-03-03 ENCOUNTER — Telehealth: Payer: Self-pay | Admitting: *Deleted

## 2019-03-03 ENCOUNTER — Ambulatory Visit: Payer: Self-pay | Admitting: *Deleted

## 2019-03-03 DIAGNOSIS — B20 Human immunodeficiency virus [HIV] disease: Secondary | ICD-10-CM

## 2019-03-03 DIAGNOSIS — C674 Malignant neoplasm of posterior wall of bladder: Secondary | ICD-10-CM

## 2019-03-03 DIAGNOSIS — Z9114 Patient's other noncompliance with medication regimen: Secondary | ICD-10-CM
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
## 2019-03-08 MED FILL — VENTOLIN HFA 90 MCG INHALER: 108 (90 BAS | 25 days supply | Qty: 18 | Fill #5

## 2019-03-08 MED FILL — SYMTUZA 800-150-200-10 MG T: 800-150-200 | 30 days supply | Qty: 30 | Fill #4

## 2019-03-14 DIAGNOSIS — C678 Malignant neoplasm of overlapping sites of bladder: Secondary | ICD-10-CM | POA: Diagnosis not present

## 2019-03-14 DIAGNOSIS — R300 Dysuria: Secondary | ICD-10-CM | POA: Diagnosis not present

## 2019-03-17 ENCOUNTER — Telehealth: Payer: Self-pay | Admitting: *Deleted

## 2019-03-17 NOTE — Telephone Encounter (Signed)
Call placed to [name]. He was able to make his appointment on Monday with [facility]. The patient stated he "thinks" they said something about a blockage related to his bladder cancer. [name] stated new medications have been ordered but he has not picked them up yet.   Call placed to [facility]/Medical records, requested a copy of the [name] visit notes from Monday. I would like to have his notes scanned into Epic.  [facility] to see what new [facility] have been called in.

## 2019-03-30 ENCOUNTER — Other Ambulatory Visit: Payer: Self-pay

## 2019-03-30 NOTE — Progress Notes (Signed)
Call placed to Mr. Clayson who states he has been unable to get out of his home for medical appointments due to painful bunions and corns on his feet. Patient states his feet are so painful he cannot walk to the bus stop and limits his ability to get around his home. Traveled to [employer] a foot care kit that includes OTC treatment for corn removal and foot moisturizer. Home visit made at this time, vitals are stable with poor medication adherence. RN educated Mr. Rominger on the importance of him staying in care with his providers and adherent to his medications. Purpose of today's visit is to provide a physical assessment and perform foot care. Foot care provided at this time in an attempt to alleviate foot discomfort to increase his ambulation and reduce the risk of falls. Mr Reichardt was very appreciative.

## 2019-03-30 NOTE — Progress Notes (Signed)
Call placed to [name] who states he has been unable to get out of his home. Home visit made today and on assessment [name] looks fragile and weak. Vitals signs are stable but he has not been adherent to his medications stating he cannot tolerate the medications due to food insecurity. He has been unable to get to the store. Traveled to [employer] and purchased food for the patient.

## 2019-03-30 NOTE — Progress Notes (Signed)
Call received from [name] stating he has not been feeling well. Prior to visit traveled to [facility] for Ensure. Home visit made and [name] appears weak but A&Ox3. Allen. [name] states he has been having abdominal pain and passing a small amount of blood while urinating again. Call made to [facility] with an appt scheduled. [name] missed his 6 month follow up that was scheduled in January. Transportation arranged for Allen. [name] at this time to ensure he can keep this appt with [facility]. Face Mask given at this time.  On assessment [name] denies Chest pain or SOB. HR is regular, lung sounds are clear, all vitals are WNL.  Skin appears to have a viremic rash. Allen. [name] admits to not taking his medications and not having an appetite. [name] has been drinking alcohol so I informed him that I have purchased some Ensure and if he cannot eat solid food to please drink an Ensure followed by his medications. Allen. [name] stated he will try. Instructed [name] to increase his water intake to try and flush his kidneys and any possible infection that may be present. Instructed [name] to seek medical attention if he begins to pass straight blood or if his symptoms become worse.

## 2019-03-30 NOTE — Progress Notes (Signed)
Meet Mr. Allman at [facility] management office/[place]. With Mr. Kice signed consent housing renewal application completed. Mr Dirickson is unable to read so the focus of today's visit is to ensure his housing is secure. The apartment complex cannot offer assistance with completion of the housing application.

## 2019-03-31 ENCOUNTER — Telehealth: Payer: Self-pay | Admitting: *Deleted

## 2019-03-31 NOTE — Telephone Encounter (Signed)
Call made to [name] today and offered a home visit. [name] stated he does not feel like having a visit today and asked if I could give him a call tomorrow. He did ask about his transportation for his upcoming appt with [facility]. I will arrange transportation for him. [facility] to confirm appt date and time. Appt is on August 31st at [place] [facility] transportation/SCAT, with transportation arranged at this time.   Returned call to Allen. [name] informing him to be on the look-out for his transportation starting at 10am

## 2019-04-07 MED FILL — VENTOLIN HFA 90 MCG INHALER: 108 (90 BAS | 25 days supply | Qty: 18 | Fill #6

## 2019-04-07 MED FILL — SYMTUZA 800-150-200-10 MG T: 800-150-200 | 30 days supply | Qty: 30 | Fill #5

## 2019-04-13 ENCOUNTER — Encounter (HOSPITAL_COMMUNITY): Payer: Self-pay

## 2019-04-13 ENCOUNTER — Encounter (HOSPITAL_COMMUNITY): Payer: Self-pay | Admitting: *Deleted

## 2019-04-26 ENCOUNTER — Other Ambulatory Visit: Payer: Self-pay

## 2019-04-28 ENCOUNTER — Other Ambulatory Visit: Payer: Self-pay | Admitting: Internal Medicine

## 2019-04-28 ENCOUNTER — Encounter: Payer: Self-pay | Admitting: *Deleted

## 2019-04-28 ENCOUNTER — Other Ambulatory Visit: Payer: Self-pay | Admitting: *Deleted

## 2019-04-28 DIAGNOSIS — B20 Human immunodeficiency virus [HIV] disease: Secondary | ICD-10-CM
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
## 2019-04-29 ENCOUNTER — Other Ambulatory Visit: Payer: Self-pay | Admitting: *Deleted

## 2019-04-29 MED FILL — VENTOLIN HFA 90 MCG INHALER: 108 (90 BAS | 25 days supply | Qty: 18 | Fill #7
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
## 2019-05-02 ENCOUNTER — Other Ambulatory Visit: Payer: Self-pay | Admitting: *Deleted

## 2019-05-02 ENCOUNTER — Other Ambulatory Visit: Payer: Self-pay

## 2019-05-02 MED FILL — SYMTUZA 800-150-200-10 MG T: 800-150-200 | 30 days supply | Qty: 30 | Fill #0

## 2019-05-02 NOTE — Patient Outreach (Signed)
[facility]) Care Management  05/02/2019  [name] Jun 30, 1956 [id number]  Social work referral received from [place], to assist patient with transportation resources, food insecurity, housing, and personal care services.   Successful outreach to patient today.  Transportation:  Patient reports using Medicaid transportation in the past but stated that it is unreliable and only for medical purposes.  He has also utilized the public bus system but stated that he is no longer able to walk the mile to the nearest bus stop.  Discussed SCAT services which patient also stated using in the past but he is unsure about current certification.  Messaged Courtney with SCAT regarding eligibility and she confirmed that he is certified for transportation.  Called patient back and informed him of this.   Food insecurity:  Will mail [place] to patient. These list kitchens and pantries in the [facility] area.  Housing: Patient currently resides at [employer] which is under new management.  He reports that his kitchen sink is currently backed up; apparently this is an ongoing issue that gets resolved by maintenance but then happens again.  He also reports a returned money order for rent but is unsure why it was returned.  Per note from [facility] [place] [facility], Danny Lawless, she left a voicemail message at apartment office on 04/29/19.  Voicemail message was also left for RN, [name] who has reportedly been assisting patient with housing concerns.  I left a message for her today as well in order to collaborate.    Personal Care Services:  Discussed request and assessment process for these services.  Faxed request form to FNP, Lanae Boast.  Will send completed form to [facility] when received.   Will follow up with patient to ensure receipt of food resources.  Will contact him when completed request for personal care services is sent to  [facility] as assessment will need to be scheduled at that time. Will also follow up regarding housing situation once able to collaborate with [name].    [name], BSW Social Worker [phone]
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
## 2019-05-03 ENCOUNTER — Other Ambulatory Visit: Payer: Self-pay | Admitting: *Deleted

## 2019-05-03 ENCOUNTER — Other Ambulatory Visit: Payer: Self-pay

## 2019-05-03 NOTE — Patient Outreach (Signed)
[facility] [facility]) Care Management  05/03/2019  Sebastiano Volkov 06-04-1956 [id number]   Follow up call to patient today.  Patient aware that he is eligible for SCAT services. RNCM, Joellyn Quails, assisted him with scheduling transport to bank on 05/04/19. Informed patient that request for personal care services was faxed to FNP, Lanae Boast, yesterday. Will follow up with patient once completed form has been sent to [facility] as assessment will need to be scheduled at that time. Talked with patient about medical alert systems.  Informed patient that these systems are not typically covered by insurance.  Will send him a list of lower cost systems. Will follow up within the next two weeks to ensure receipt of this and food resources.   [name], BSW Social Worker [phone]

## 2019-05-03 NOTE — Patient Outreach (Signed)
[facility]) Care Management  05/03/2019  [name] Feb 15, 1956 [id number]   Care coordination  [facility] RN CM assisted pt to call [facility] at [phone] to reserve ride to his bank on 05/04/19 with pick up from 1100-1130 and ride home pick up from 1230 -1300. This is to assist with getting corrected funds to pay his rent. Spoke with [facility]. Pt appreciative. [name] again offered to give [facility] number and explained to him he will be responsible in the future to make his own appointments. He had to be called x 3 before answering, HIPAA verified. His voice mail box on his mobile is full. [name] explained the importance of him clearing this voice mailbox to prevent from missing possible messages from [place] or his apartment complex office staff. He reports he can get assistance from a male neighbor to clear his vice messages and to hopefully find and put [facility] number in his phone for future reference. He voiced understanding  He was unable to make his 05/11/19 1300 MD appointment as he can not make an appointment more than 7 days in advance.   He denies medical concerns today no falls, feels about the same as 05/02/19   Plan: [facility] RN CM will follow up with Mr Spayd within the next 4 business days to assist with transportation appointments, review collaboration with community resources, continue to assess needs and provide education on his major medical conditions  Pt encouraged to return a call to [place] prnHe was given CM office number  [place]. [name], RN, BSN, [place] Coordinator Office number [phone] Mobile number [phone]  Main [facility] number [phone] Fax number [phone]

## 2019-05-04 ENCOUNTER — Telehealth: Payer: Self-pay | Admitting: *Deleted

## 2019-05-04 NOTE — Telephone Encounter (Addendum)
Returned call to [name] today without an answer. Unable to leave a VM at this time due to mailbox being full.  Patient placed on HOLD at this time. PATIENT ON HOLD  Plan of Care orders have expired effective 05/05/2019 but GOALS have not been completely meet at this time. Currently the patient is being followed by [facility] case manager/[name]. [name] and I have conferenced I will step back from assisting [name] to allow a fresh face and new ideas to assist him.  HOLD will be effective for 30 days. I will re-evaluate [name] to see if additional orders or if a discharge is needed for [facility] to continue to follow

## 2019-05-05 DIAGNOSIS — E559 Vitamin D deficiency, unspecified: Secondary | ICD-10-CM

## 2019-05-05 HISTORY — DX: Vitamin D deficiency, unspecified: E55.9

## 2019-05-10 ENCOUNTER — Other Ambulatory Visit: Payer: Self-pay | Admitting: *Deleted

## 2019-05-10 NOTE — Patient Outreach (Addendum)
[facility]) Care Management  05/10/2019  Jerrick Colglazier 06-Feb-1956 [id number]   Care coordination/Follow up  Called Mr Koel HIPAA verified (DOB and Address) [facility] RN CM called Mr Vuu to follow up with him on his ride to the bank to clarify his rental money order He reports doing well today He and CM completed a conference call to [place] referral line to complete  Request for ride to see NP at  [place] for 05/11/19 Pick time for pt between 1130 and 1200 and return ride pick up at 1430 -1500 Mr Rei wrote down the [facility] referral number. [name] instructed him to call the number after disconnecting with [facility] RN CM to have this number in his mobile phone for future references. He agreed to do so He has received assistance to update his mobile messaging system  He has not heard from [facility] RN nor had [facility] RN received a response  He confirms no other needs at this time today He is aware [name] to follow up on "next week" Sent an email to [place] RN CM will follow up with Mr Fiene within the next 4 business days to assess for further needs after his primary care MD follow up and for further education    [place] L. [name], RN, BSN, [place] Coordinator Office number [phone] Mobile number [phone]  Main [facility]N number [phone] Fax number [phone]

## 2019-05-11 ENCOUNTER — Other Ambulatory Visit: Payer: Self-pay

## 2019-05-11 ENCOUNTER — Encounter: Payer: Self-pay | Admitting: Family Medicine

## 2019-05-11 ENCOUNTER — Ambulatory Visit (INDEPENDENT_AMBULATORY_CARE_PROVIDER_SITE_OTHER): Payer: Medicare Other | Admitting: Family Medicine

## 2019-05-11 VITALS — BP 135/60 | HR 92 | Temp 97.5°F | Ht 64.0 in | Wt 104.4 lb

## 2019-05-11 DIAGNOSIS — E559 Vitamin D deficiency, unspecified: Secondary | ICD-10-CM | POA: Diagnosis not present

## 2019-05-11 DIAGNOSIS — R7303 Prediabetes: Secondary | ICD-10-CM

## 2019-05-11 DIAGNOSIS — R8290 Unspecified abnormal findings in urine: Secondary | ICD-10-CM | POA: Diagnosis not present

## 2019-05-11 DIAGNOSIS — Z8639 Personal history of other endocrine, nutritional and metabolic disease: Secondary | ICD-10-CM | POA: Diagnosis not present

## 2019-05-11 DIAGNOSIS — J4489 Other specified chronic obstructive pulmonary disease: Secondary | ICD-10-CM

## 2019-05-11 DIAGNOSIS — R634 Abnormal weight loss: Secondary | ICD-10-CM | POA: Diagnosis not present

## 2019-05-11 DIAGNOSIS — Z09 Encounter for follow-up examination after completed treatment for conditions other than malignant neoplasm: Secondary | ICD-10-CM | POA: Diagnosis not present

## 2019-05-11 DIAGNOSIS — I10 Essential (primary) hypertension: Secondary | ICD-10-CM

## 2019-05-11 DIAGNOSIS — B20 Human immunodeficiency virus [HIV] disease: Secondary | ICD-10-CM

## 2019-05-11 DIAGNOSIS — J449 Chronic obstructive pulmonary disease, unspecified: Secondary | ICD-10-CM

## 2019-05-11 DIAGNOSIS — E639 Nutritional deficiency, unspecified: Secondary | ICD-10-CM | POA: Diagnosis not present

## 2019-05-11 DIAGNOSIS — R630 Anorexia: Secondary | ICD-10-CM | POA: Diagnosis not present

## 2019-05-11 LAB — POCT GLYCOSYLATED HEMOGLOBIN (HGB A1C): Hemoglobin A1C: 5.1 % (ref 4.0–5.6)

## 2019-05-11 NOTE — Progress Notes (Signed)
Patient [facility]   Established Patient Office Visit  Subjective:  Patient ID: [name], male    DOB: Mar 31, 1956  Age: 63 y.o. MRN: [id number]  CC: No chief complaint on file.   HPI [name] is a 63 year old male who presents for Follow Up Today.   Past Medical History:  Diagnosis Date  . Asthma with COPD ([place])   . Bladder cancer (HCC)    and prostatic urethra cancer  HX TURBT'S IN [place], [place]  WITH INSTILLATION CHEMO TX'S  . Bladder tumor   . History of transfusion   . HIV disease ([place])    under care of [facility]  . Hypertension   . Nocturia   . Vitamin D deficiency 05/2019   Current Status: This will be [name] office visit with me. He was previously seeing Lanae Boast, NP for her PCP needs. Since his last office visit, she is doing well with no complaints. He states that he has not had an appetite for the last 3 months. He has not be able to eat, and his weakness has increased. He is currently eating at least 1 meal a day. He states that he does have Ensure at home, but he is currently only drinking one daily. He is currently drinking several Pepsis daily. He currently has an Rx for Megace, but he is not taking at this time. He denies visual changes, chest pain, cough, shortness of breath, heart palpitations, and falls. He has occasional headaches and dizziness with position changes. Denies severe headaches, confusion, seizures, double vision, and blurred vision, nausea and vomiting. He denies fevers, chills, fatigue, recent infections, weight loss, and night sweats. No reports of GI problems such as diarrhea, and constipation. He has no reports of blood in stools, dysuria and hematuria. He denies pain today. His anxiety is moderate today. He is currently receiving transportation services via [facility]. He continues to follow up with Infection Disease as needed. His last visit was 2 months ago.    Past Surgical History:  Procedure Laterality Date  . CATARACT EXTRACTION W/ INTRAOCULAR LENS  IMPLANT, BILATERAL  2014  . FULGURATION OF BLADDER TUMOR Bilateral 05/31/2014   Procedure: BLADDER BIOPSY WITH FULGERATION BILATERAL RETROGRADE [id number];  Surgeon: Ardis Hughs, MD;  Location: [facility];  Service: Urology;  Laterality: Bilateral;  . JOINT REPLACEMENT    . LUMBAR DISC SURGERY  x2  last one 2008  . TOTAL HIP ARTHROPLASTY Bilateral x each with revision's   last one for Right 2012/  last one for Left 2010  . TRANSURETHRAL RESECTION OF BLADDER TUMOR  X3   last one 02/ 2015 ([place], [place])  . TRANSURETHRAL RESECTION OF BLADDER TUMOR WITH GYRUS (TURBT-GYRUS) Bilateral 05/16/2015   Procedure: TRANSURETHRAL RESECTION OF BLADDER TUMOR WITH GYRUS (TURBT-GYRUS), bilateral retrograde pyelogram;  Surgeon: Ardis Hughs, MD;  Location: [facility];  Service: Urology;  Laterality: Bilateral;  . TRANSURETHRAL RESECTION OF BLADDER TUMOR WITH GYRUS (TURBT-GYRUS) N/A 07/05/2015   Procedure: RE RESECTION OF BLADDER TUMOR (TURBT) RIGHT BLADDER NECK;  Surgeon: Ardis Hughs, MD;  Location: [facility];  Service: Urology;  Laterality: N/A;    Family History  Problem Relation Age of Onset  . Cancer Mother        Gastric Cancer  . Hypertension Mother   . Heart disease Mother   . Cancer Maternal Grandmother        breast cancer  Social History   Socioeconomic History  . Marital status: Legally Separated    Spouse name: Not on file  . Number of children: Not on file  . Years of education: Not on file  . Highest education level: Not on file  Occupational History  . Not on file  Social Needs  . Financial resource strain: Somewhat hard  . Food insecurity    Worry: Sometimes true    Inability: Sometimes true  . Transportation needs    Medical: Yes    Non-medical: Yes  Tobacco Use  . Smoking status: Current Every Day Smoker    Packs/day: 0.10    Years: 17.00    Pack  years: 1.70    Types: Cigarettes  . Smokeless tobacco: Never Used  . Tobacco comment: 3/day; looking into vaping instead  Substance and Sexual Activity  . Alcohol use: Yes    Alcohol/week: 6.0 standard drinks    Types: 6 Cans of beer per week    Comment: occasional  . Drug use: No  . Sexual activity: Yes    Partners: Female    Birth control/protection: Condom    Comment: given condoms  Lifestyle  . Physical activity    Days per week: 0 days    Minutes per session: 0 min  . Stress: Not on file  Relationships  . Social Herbalist on phone: Never    Gets together: Never    Attends religious service: Never    Active member of club or organization: No    Attends meetings of clubs or organizations: Never    Relationship status: Separated  . Intimate partner violence    Fear of current or ex partner: Not on file    Emotionally abused: Not on file    Physically abused: Not on file    Forced sexual activity: Not on file  Other Topics Concern  . Not on file  Social History Narrative  . Not on file    Outpatient Medications Prior to Visit  Medication Sig Dispense Refill  . amLODipine (NORVASC) 5 MG tablet TAKE 1 TABLET (5 MG TOTAL) BY MOUTH DAILY. 30 tablet 5  . cetirizine (ZYRTEC) 10 MG tablet Take 1 tablet (10 mg total) by mouth at bedtime. 90 tablet 0  . ENSURE (ENSURE) Take 237 mLs by mouth 2 (two) times daily between meals. Decreased Nutritional intake due to Bladder Cancer treatment and uncontrolled HIV 237 mL 11  . ipratropium-albuterol (DUONEB) 0.5-2.5 (3) MG/3ML SOLN Take 3 mLs by nebulization every 6 (six) hours as needed (wheezing and chest tightness). 360 mL 11  . megestrol (MEGACE) 400 MG/10ML suspension Take 10 mLs (400 mg total) by mouth daily. 300 mL 5  . SYMTUZA 800-150-200-10 MG TABS TAKE 1 TABLET BY MOUTH DAILY WITH BREAKFAST. 30 tablet 1  . VENTOLIN HFA 108 (90 Base) MCG/ACT inhaler INHALE 2 PUFFS BY MOUTH INTO THE LUNGS EVERY 6 HOURS AS NEEDED FOR  WHEEZING OR SHORTNESS OF BREATH 18 g 11  . amoxicillin-clavulanate (AUGMENTIN) 875-125 MG tablet Take 1 tablet by mouth 2 (two) times daily. 20 tablet 0  . amoxicillin-clavulanate (AUGMENTIN) 875-125 MG tablet Take 1 tablet by mouth 2 (two) times daily. 20 tablet 0  . methylPREDNISolone (MEDROL DOSEPAK) 4 MG TBPK tablet Take as directed on pack 21 tablet 0  . methylPREDNISolone (MEDROL DOSEPAK) 4 MG TBPK tablet Take as directed on pack 21 tablet 0  . ondansetron (ZOFRAN ODT) 8 MG disintegrating tablet Take 1 tablet (8 mg  total) by mouth daily. Take after you eat and then 30 minutes later take your Symtuza. 30 tablet 5  . Respiratory Therapy Supplies (NEBULIZER/TUBING/MOUTHPIECE) KIT 1 Units by Does not apply route daily. 1 each 11   No facility-administered medications prior to visit.     Allergies  Allergen Reactions  . Aspirin Shortness Of Breath, Nausea And Vomiting and Swelling    ROS Review of Systems  Constitutional: Positive for appetite change (decreased).  HENT: Negative.   Eyes: Negative.   Respiratory: Negative.   Cardiovascular: Negative.   Gastrointestinal: Negative.   Endocrine: Negative.   Genitourinary: Negative.   Musculoskeletal: Negative.   Skin: Negative.   Allergic/Immunologic: Negative.   Neurological: Negative.   Hematological: Negative.   Psychiatric/Behavioral: Negative.    Objective:    Physical Exam  Constitutional: He is oriented to person, place, and time. He appears distressed.  Appears malnurished  HENT:  Head: Normocephalic and atraumatic.  Eyes: Conjunctivae are normal.  Neck: Normal range of motion. Neck supple.  Cardiovascular: Normal rate, regular rhythm, normal heart sounds and intact distal pulses.  Pulmonary/Chest: Effort normal and breath sounds normal.  Abdominal: Soft. Bowel sounds are normal.  Musculoskeletal: Normal range of motion.  Neurological: He is alert and oriented to person, place, and time. He has normal reflexes.   Skin: Skin is warm and dry.  Psychiatric: He has a normal mood and affect. His behavior is normal. Judgment and thought content normal.  Nursing note and vitals reviewed.   BP 135/60 (BP Location: Right Arm, Patient Position: Sitting, Cuff Size: Normal)   Pulse 92   Temp (!) 97.5 F (36.4 C) (Oral)   Ht 5' 4"  (1.626 m)   Wt 104 lb 6.4 oz (47.4 kg)   SpO2 100%   BMI 17.92 kg/m  Wt Readings from Last 3 Encounters:  05/11/19 104 lb 6.4 oz (47.4 kg)  07/13/18 123 lb (55.8 kg)  06/30/18 122 lb (55.3 kg)     Health Maintenance Due  Topic Date Due  . COLONOSCOPY  10/05/2003  . PNA vac Low Risk Adult (1 of 2 - PCV13) 10/05/2018  . INFLUENZA VACCINE  03/05/2019    There are no preventive care reminders to display for this patient.  Lab Results  Component Value Date   TSH 3.590 05/11/2019   Lab Results  Component Value Date   WBC 3.6 05/11/2019   HGB 12.4 (L) 05/11/2019   HCT 35.8 (L) 05/11/2019   MCV 99 (H) 05/11/2019   PLT 131 (L) 05/11/2019   Lab Results  Component Value Date   NA 123 (L) 05/11/2019   K 4.7 05/11/2019   CO2 17 (L) 05/11/2019   GLUCOSE 91 05/11/2019   BUN 7 (L) 05/11/2019   CREATININE 1.00 05/11/2019   BILITOT 0.8 05/11/2019   ALKPHOS 157 (H) 05/11/2019   AST 69 (H) 05/11/2019   ALT 20 05/11/2019   PROT 9.0 (H) 05/11/2019   ALBUMIN 3.4 (L) 05/11/2019   CALCIUM 8.9 05/11/2019   ANIONGAP 11 09/11/2016   Lab Results  Component Value Date   CHOL 99 (L) 05/11/2019   Lab Results  Component Value Date   HDL 71 05/11/2019   Lab Results  Component Value Date   LDLCALC 17 05/11/2019   Lab Results  Component Value Date   TRIG 40 05/11/2019   Lab Results  Component Value Date   CHOLHDL 1.4 05/11/2019   Lab Results  Component Value Date   HGBA1C 5.1 05/11/2019  Assessment & Plan:   1. COPD with asthma ([place]) Stable. No signs or symptoms of respiratory distress noted or reported.  - Ambulatory referral to Home Health  2. Essential  hypertension The current medical regimen is effective; blood pressure is stable at 135/60 today; continue present plan and medications as prescribed. He will continue to take medications as prescribed, to decrease high sodium intake, excessive alcohol intake, increase potassium intake, smoking cessation, and increase physical activity of at least 30 minutes of cardio activity daily. He will continue to follow Heart Healthy or DASH diet. - CBC with Differential - Comprehensive metabolic panel - Lipid Panel - TSH - Ambulatory referral to Home Health  3. HIV disease [facility]) Patient needs to make follow up with Infection Disease. We will attempt to schedule appointment for patient.  - Ambulatory referral to [place]  4. Undernourished - Ambulatory referral to [place]  5. Anorexia - Ambulatory referral to [place]  6. History of vitamin B deficiency - Vitamin B12  7. Vitamin D deficiency - Vitamin D, 25-hydroxy  8. Unintentional weight loss He has had a 9 lb weight loss in 10 months.   9. Pre-diabetes Hgb A1c is stable at 5.1 today. He will continue medication as prescribed, to decrease foods/beverages high in sugars and carbs and follow Heart Healthy or DASH diet. Increase physical activity to at least 30 minutes cardio exercise daily.  - POCT glycosylated hemoglobin (Hb A1C)  10. Abnormal urinalysis Result are pending.  - Urine Culture  11. Follow up He will follow up in 1 month.  No orders of the defined types were placed in this encounter.   Orders Placed This Encounter  Procedures  . Urine Culture  . CBC with Differential  . Comprehensive metabolic panel  . Lipid Panel  . TSH  . Vitamin B12  . Vitamin D, 25-hydroxy  . Ambulatory referral to Home Health  . POCT glycosylated hemoglobin (Hb A1C)     Referral Orders     Ambulatory referral to [place],  MSN, FNP-BC [place] [address] [place], [place] [phone]- fax   Problem List Items Addressed This Visit      Cardiovascular and Mediastinum   HTN (hypertension) - Primary   Relevant Orders   CBC with Differential (Completed)   Comprehensive metabolic panel (Completed)   Lipid Panel (Completed)   TSH (Completed)   Ambulatory referral to [place]     Other   Anorexia   Relevant Orders   Ambulatory referral to Home Health   HIV disease [facility])   Relevant Orders   Ambulatory referral to [place]   Unintentional weight loss    Other Visit Diagnoses    COPD with asthma ([place])       Relevant Orders   Ambulatory referral to [place]   Undernourished       Relevant Orders   Ambulatory referral to [place]   History of vitamin B deficiency       Relevant Orders   Vitamin B12 (Completed)   Vitamin D deficiency       Relevant Orders   Vitamin D, 25-hydroxy (Completed)   Pre-diabetes       Relevant Orders   POCT glycosylated hemoglobin (Hb A1C) (Completed)   Abnormal urinalysis       Relevant Orders   Urine Culture   Follow up  No orders of the defined types were placed in this encounter.   Follow-up: Return in about 1 month (around 06/11/2019).    Azzie Glatter, FNP

## 2019-05-12 ENCOUNTER — Other Ambulatory Visit: Payer: Self-pay | Admitting: Family Medicine

## 2019-05-12 ENCOUNTER — Encounter: Payer: Self-pay | Admitting: Family Medicine

## 2019-05-12 ENCOUNTER — Other Ambulatory Visit: Payer: Self-pay | Admitting: *Deleted

## 2019-05-12 ENCOUNTER — Telehealth: Payer: Self-pay | Admitting: *Deleted

## 2019-05-12 LAB — CBC WITH DIFFERENTIAL/PLATELET
Basophils Absolute: 0 10*3/uL (ref 0.0–0.2)
Basos: 1 %
EOS (ABSOLUTE): 0 10*3/uL (ref 0.0–0.4)
Eos: 0 %
Hematocrit: 35.8 % — ABNORMAL LOW (ref 37.5–51.0)
Hemoglobin: 12.4 g/dL — ABNORMAL LOW (ref 13.0–17.7)
Immature Grans (Abs): 0 10*3/uL (ref 0.0–0.1)
Immature Granulocytes: 1 %
Lymphocytes Absolute: 1.4 10*3/uL (ref 0.7–3.1)
Lymphs: 39 %
MCH: 34.3 pg — ABNORMAL HIGH (ref 26.6–33.0)
MCHC: 34.6 g/dL (ref 31.5–35.7)
MCV: 99 fL — ABNORMAL HIGH (ref 79–97)
Monocytes Absolute: 0.2 10*3/uL (ref 0.1–0.9)
Monocytes: 5 %
Neutrophils Absolute: 2 10*3/uL (ref 1.4–7.0)
Neutrophils: 54 %
Platelets: 131 10*3/uL — ABNORMAL LOW (ref 150–450)
RBC: 3.61 x10E6/uL — ABNORMAL LOW (ref 4.14–5.80)
RDW: 12.6 % (ref 11.6–15.4)
WBC: 3.6 10*3/uL (ref 3.4–10.8)

## 2019-05-12 LAB — COMPREHENSIVE METABOLIC PANEL
ALT: 20 IU/L (ref 0–44)
AST: 69 IU/L — ABNORMAL HIGH (ref 0–40)
Albumin/Globulin Ratio: 0.6 — ABNORMAL LOW (ref 1.2–2.2)
Albumin: 3.4 g/dL — ABNORMAL LOW (ref 3.8–4.8)
Alkaline Phosphatase: 157 IU/L — ABNORMAL HIGH (ref 39–117)
BUN/Creatinine Ratio: 7 — ABNORMAL LOW (ref 10–24)
BUN: 7 mg/dL — ABNORMAL LOW (ref 8–27)
Bilirubin Total: 0.8 mg/dL (ref 0.0–1.2)
CO2: 17 mmol/L — ABNORMAL LOW (ref 20–29)
Calcium: 8.9 mg/dL (ref 8.6–10.2)
Chloride: 93 mmol/L — ABNORMAL LOW (ref 96–106)
Creatinine, Ser: 1 mg/dL (ref 0.76–1.27)
GFR calc Af Amer: 91 mL/min/{1.73_m2} (ref >59)
GFR calc non Af Amer: 79 mL/min/{1.73_m2} (ref >59)
Globulin, Total: 5.6 g/dL — ABNORMAL HIGH (ref 1.5–4.5)
Glucose: 91 mg/dL (ref 65–99)
Potassium: 4.7 mmol/L (ref 3.5–5.2)
Sodium: 123 mmol/L — ABNORMAL LOW (ref 134–144)
Total Protein: 9 g/dL — ABNORMAL HIGH (ref 6.0–8.5)

## 2019-05-12 LAB — LIPID PANEL
Chol/HDL Ratio: 1.4 ratio (ref 0.0–5.0)
Cholesterol, Total: 99 mg/dL — ABNORMAL LOW (ref 100–199)
HDL: 71 mg/dL (ref >39)
LDL Chol Calc (NIH): 17 mg/dL (ref 0–99)
Triglycerides: 40 mg/dL (ref 0–149)
VLDL Cholesterol Cal: 11 mg/dL (ref 5–40)

## 2019-05-12 LAB — VITAMIN D 25 HYDROXY (VIT D DEFICIENCY, FRACTURES): Vit D, 25-Hydroxy: 5.1 ng/mL — ABNORMAL LOW (ref 30.0–100.0)

## 2019-05-12 LAB — TSH: TSH: 3.59 u[IU]/mL (ref 0.450–4.500)

## 2019-05-12 LAB — VITAMIN B12: Vitamin B-12: 596 pg/mL (ref 232–1245)

## 2019-05-12 MED ORDER — VITAMIN D (ERGOCALCIFEROL) 1.25 MG (50000 UNIT) PO CAPS: 50000.0000 [IU] | ORAL_CAPSULE | ORAL | 6 refills | Status: DC

## 2019-05-12 NOTE — Telephone Encounter (Signed)
Called the patient to try and get him rescheduled for a visit as Dr Clovis Riley office called and wants him seen. Was unable to reach patient and his mailbox is full. Will reach out to [employer] and have her check in on him and get him scheduled to see the doctor.  Called [name] again and was able to reach him and give him an office visit for 05/16/19 at 1115 am.

## 2019-05-12 NOTE — Patient Outreach (Signed)
[facility]) Care Management  05/12/2019  [name] 1956-07-16 [id number]   Care coordination  [facility] RN CM received a message from Duncan Dull the [place] apartment complex new office manager She reports she has been in her position of only a month and is getting familiar with [name].  [name] shared his voiced concerns with his needed home improvement in his bathroom for safety, a clogged kitchen sink and concern with his rental money order related to his writing abilities [name] reports the plumbing issues for his kitchen sink has been resolved  She discussed a pending letter for a violation of his lease related to unsanitary status of his home. Maggots in his toilet and pests in his apartment was noted. She shared that the pest control staff may not be able to assist related to the unsanitary condition   [name] shared that [facility] referral has been completed and [name] has voiced that he does not have a good support system although there is family locally [facility] RN CM will discuss this with [facility] and [name]    Plan [facility] RN CM will follow up with [name] within the next 7-10 business days to assess for further needs after his primary care MD follow up and for further education   [place] copy of note sent to [place]. [name], RN, BSN, [place] Coordinator Office number [phone] Mobile number [phone]  Main [facility] number [phone] Fax number [phone]

## 2019-05-13 ENCOUNTER — Ambulatory Visit: Payer: Self-pay | Admitting: *Deleted

## 2019-05-13 ENCOUNTER — Other Ambulatory Visit: Payer: Self-pay

## 2019-05-13 ENCOUNTER — Other Ambulatory Visit: Payer: Self-pay | Admitting: Family Medicine

## 2019-05-13 DIAGNOSIS — N390 Urinary tract infection, site not specified: Secondary | ICD-10-CM

## 2019-05-13 LAB — URINE CULTURE

## 2019-05-13 MED ORDER — SULFAMETHOXAZOLE-TRIMETHOPRIM 800-160 MG PO TABS: 1.0000 | ORAL_TABLET | Freq: Two times a day (BID) | ORAL | 0 refills | Status: DC

## 2019-05-13 NOTE — Patient Outreach (Signed)
[facility]) Care Management  05/13/2019  Kemaury Quance [birth year] [id number]   [place] regarding status of request for pcs form that was faxed on 05/03/19.   Representative reports that fax was not received.  Faxed again today.  Patient now being seen by FNP, Kathe Becton. Will follow up next week to ensure receipt.  [name], BSW Social Worker [phone]

## 2019-05-16 ENCOUNTER — Other Ambulatory Visit: Payer: Self-pay | Admitting: *Deleted

## 2019-05-16 ENCOUNTER — Other Ambulatory Visit: Payer: Self-pay

## 2019-05-16 MED FILL — VIT D2 1.25 MG (50,000 UNIT: 1.25 MG | 35 days supply | Qty: 5 | Fill #0

## 2019-05-16 NOTE — Patient Outreach (Addendum)
[name]) Care Management  05/16/2019  [name] Jun 23, 1956 [id number]  Collaboration with [name], Transportation to appointment for 05/17/19  Continued Complex case services for [facility] EMMI Join patient Referral date 04/26/19   Insurance: [employer] Medicare and [employer] access [employer] Health: No admissions or ED visits in the last 12 months     Collaboration with [name], [name] and [name], HIV RN [name]    Allen [facility] RN [name] received Allen return call from [name], HIV RN [name]. She and [name] completed Allen conference call with [name], [name] [name] and case information reviewed to include his support system, the home environment, his literacy, housing interventions,  HIV RN [name] has been working with [name] for ~ 4 years. He was last seen around September 2020. There has been non compliance related to home medical care, oncology medical care (bladder cancer treatments) and viral medication non compliance.  He is not able to read or write. He is good with numbers Has Allen 8th grade education Has application for housing. Barrier with getting local housing authority services related to an outstanding bill from abandonment of housing in [place]. On [facility] senior living waiting list for last 2 years. An e-mail account was established by [name] to assist in housing applications (to be provided to [name]). Allen portion of his rent is paid by [name] clinic Has brothers and daughter locally not active in his care He drinks alcohol beverages (beer)  Discussed the [facility] transportation success after correcting the listed home address with [facility] and pt Noted pt with Allen 05/17/19 1115 [name] appointment    Outreach attempt  successful at the home number  Patient is able to verify HIPAA, DOB and address Reviewed and addressed reason for the callwith patient- f/u appointment, collaboration with [name], HIV RN [name] and Apartment Press photographer, [name] plus transportation to upcoming  [name] 05/17/19 appointment Consent: [facility] RN [name] reviewed [name] services with patient. Patient gave verbal consent for services [name] telephonic RN [name] and [name] [name].  [name] RN [name] discussed with [name] the collaboration with his apartment Press photographer, [name]. He and [name] discussed the importance of home maintenance and not violating his apartment lease by keeping the apartment free of maggots, bugs, etc and not smoking. He voiced understanding. He voiced his attempts to maintain the cleanliness of his apartment but reports Allen barrier repeated ongoing clogged kitchen sink and bathroom toilet. He has had Allen neighbor purchase clorox to clean these areas in the past. He has not received Allen letter of eviction but is aware that these violations are reasons for eviction. He is aware and voices understanding that temporary housing may include shelters if he is evicted prior to finding any alternate housing while he remains on waiting list at [name] senior living.  [facility] RN [name] reviewed the collaboration of [facility] RN [name], [name] and HIV RN [name] with [name]. [name] agree to help Allen [facility] RN [name], [name] and HIV RN [name] to attempt to assist him by him providing candor and compliance  He voices understanding  [name] and [facility] RN [name] called [facility] and spoke with [name] to schedule transportation for him on 05/17/19 for Allen pick up between [id number] for his 1115 appointment time.  He was able to look through his mobile number and state the correct number for [facility] as [phone]. He reports he called on 05/11/19  but did not know [name] address  Social [name] is Allen 63 year old disabled legally separated patient who lives alone in his apartment. He is on Allen fixed income. He states he previously was in [name] and has been in his present apartment about 4 years. [place] and stopped going to school in the 8th grade to assist his mother and siblings. He states he is independent/assist with all his care needs He reports he usually is  independent with all his needs but since Allen fall off Allen city bus he has been having issues He reports not having access to computers or Internet services at home He states he does not have anyone reliable he would consider as Allen caregiver. He reports having family in [place]. Occasional help from his brother, [name] and his girlfriend, but he has to pay him to assist him and then the assistance is unreliable. His brother [name] is legally blind and has Diabetes medical issues. No help from [name], brother nor his Daughter [name] who lives locally. Occasionally he is able to pay Allen "girl" who is Allen neighbor to assist by "going to the store for me" or " helping with his phone" while the voice mail box is full. [name] is Allen HIV RN [name] [phone]) that checks on him every 1-2 months either telephonically or by home visit.Marland Kitchen  He has concerns with transportation to medical appointments He walks or uses the city bus and cabs to get around in [place]. He reports having Allen corner store but with his mobility issues he reports it takes him 30+ minutes to walk "Allen block that it takes most people five minutes" related to "my legs give out Allen lot" He reports concern with reaching out to others to take him places related to covid precautions   Conditions: HTN, HIV, Asthma, Bladder Cancer, Depression, syncope, anorexia, unintentional weight loss, abdominal pain, dysuria, smoker , poor vision per pt, alcoholism (Beers)    Medications [name] admits today that he is not taken his medications as ordered especially his ID/viral medications. He reports occasionally this is related to GI concerns (Nausea)  Plan: [facility] RN [name] will follow up with [name] within the next 4 business days to continue to assess needs and provide education on his major medical conditions   Routed note to MDs [facility] [name] Care Plan Problem One     Most Recent Value  Care Plan Problem One  Impaired safety, fall risk with history of  multiple falls affecting his other medical history of hypertension, HIV, bladder cancer and asthma  Role Documenting the Problem One  Care Management Telephonic Coordinator  Care Plan for Problem One  Active  Allen Long Term Goal   over the next 60 days the patient will have decreased or no falls as evidenced by patient report of decreased falls with use of DME, safety measures  Allen Long Term Goal Start Date  04/28/19  Interventions for Problem One Long Term Goal  assess and follow up   [name] Short Term Goal #1   over the next 30 days patient will vebalize Allen better understanding of signs and symptoms of hypertension and hypotension  [name] Short Term Goal #1 Start Date  04/28/19  Interventions for Short Term Goal #1  assess and follow up   [facility] [name] Short Term Goal #2   over the next 45 days patient will assist with identifying  and use of at least two types of safety measures to prevent falls  [name] Short Term Goal #2 Start Date  04/28/19  Interventions for Short Term Goal #2  assess and follow up     [name] Care Plan Problem Two     Most Recent Value  Care Plan Problem Two  Social needs related to housing, transportation and possible personal care services  Role Documenting the Problem Two  Care Management Telephonic [place] for Problem Two  Active  Interventions for Problem Two Long Term Goal   assessed, assisted with setup of appointment of transportation   [place] Term Goal  over the next 45 days patient will assist with identifying and use of at least two available resources to meet the transportation, housing and food insecurity needs  Allen Long Term Goal Start Date  04/28/19  [facility] [name] Short Term Goal #1   over the next 10 days patient will have transportation resources to assist with medical visits and community access as verbalized by the patient during follow up  [name] Short Term Goal #1 Start Date  04/28/19  [place] [name] [name] Short Term Goal #1 Met Date   05/03/19  [name] Short Term  Goal #2   over the next 14 days the patietn will be able to verbalize at least 2 resources to assist with his food insecurity and housing needs in follow up calls  [name] [name] Short Term Goal #2 Start Date  04/28/19  Interventions for Short Term Goal #2  assess and follow       Kimberly L. [name], RN, BSN, [place] Coordinator Office number [phone] Mobile number [phone]  Main Allen number [phone] Fax number [phone]

## 2019-05-16 NOTE — Patient Outreach (Signed)
[facility]) Care Management  05/16/2019  Ryman Stfleur [birth year] [id number]   Spoke with Bailey Mech at [facility] to determine if request for pcs was received via fax.  Unfortunately, form has still not been received.  Faxed again today.  Will call tomorrow to ensure receipt. Contacted patient today to inform him that I am still working to get completed request form.  Inquired about whether or not he received food and medical alert resources that were mailed to him.  Patient reports that he had misplaced his mailbox key and just recently found it; he has a lot of mail that he has not gone through yet.  Will follow up with him again before the end of the week to ensure receipt of resources and provided update on request for pcs.  [name], BSW Social Worker [phone]

## 2019-05-17 ENCOUNTER — Other Ambulatory Visit: Payer: Self-pay

## 2019-05-17 ENCOUNTER — Telehealth: Payer: Self-pay | Admitting: *Deleted

## 2019-05-17 ENCOUNTER — Encounter: Payer: Self-pay | Admitting: Internal Medicine

## 2019-05-17 ENCOUNTER — Ambulatory Visit: Payer: Self-pay | Admitting: *Deleted

## 2019-05-17 ENCOUNTER — Ambulatory Visit (INDEPENDENT_AMBULATORY_CARE_PROVIDER_SITE_OTHER): Payer: Medicare Other | Admitting: Internal Medicine

## 2019-05-17 ENCOUNTER — Telehealth: Payer: Self-pay | Admitting: Pharmacy Technician

## 2019-05-17 ENCOUNTER — Other Ambulatory Visit: Payer: Self-pay | Admitting: *Deleted

## 2019-05-17 DIAGNOSIS — B20 Human immunodeficiency virus [HIV] disease: Secondary | ICD-10-CM

## 2019-05-17 DIAGNOSIS — R634 Abnormal weight loss: Secondary | ICD-10-CM

## 2019-05-17 DIAGNOSIS — Z9114 Patient's other noncompliance with medication regimen: Secondary | ICD-10-CM

## 2019-05-17 DIAGNOSIS — F331 Major depressive disorder, recurrent, moderate: Secondary | ICD-10-CM | POA: Diagnosis not present

## 2019-05-17 DIAGNOSIS — Z550 Illiteracy and low-level literacy: Secondary | ICD-10-CM

## 2019-05-17 DIAGNOSIS — Z23 Encounter for immunization: Secondary | ICD-10-CM | POA: Diagnosis not present

## 2019-05-17 MED ORDER — ONDANSETRON HCL 4 MG PO TABS: 4.0000 mg | ORAL_TABLET | Freq: Three times a day (TID) | ORAL | 5 refills | Status: DC | PRN

## 2019-05-17 MED ORDER — SYMTUZA 800-150-200-10 MG PO TABS: 1.0000 | ORAL_TABLET | Freq: Every day | ORAL | 11 refills | Status: DC

## 2019-05-17 MED ORDER — MEGESTROL ACETATE 400 MG/10ML PO SUSP: 400.0000 mg | Freq: Every day | ORAL | 5 refills | Status: DC

## 2019-05-17 NOTE — Progress Notes (Signed)
Patient Active Problem List   Diagnosis Date Noted  . HIV disease ([place]) 04/18/2015    Priority: High  . Dysuria 05/29/2015    Priority: Medium  . Depression 04/19/2015    Priority: Medium  . 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 Malignant neoplasm of bladder ([place]) 03/30/2015    Priority: Medium  . Abdominal pain 02/19/2017  . Exacerbation of asthma 09/09/2016  . Acute respiratory failure with hypoxia ([place]) 09/09/2016  . Unintentional weight loss 07/01/2016  . Anorexia 03/11/2016  . Syncope 10/31/2015  . HTN (hypertension) 03/30/2015  . Asthma, chronic 03/30/2015    Patient's Medications  New Prescriptions   ONDANSETRON (ZOFRAN) 4 MG TABLET    Take 1 tablet (4 mg total) by mouth every 8 (eight) hours as needed for nausea or vomiting.  Previous Medications   AMLODIPINE (NORVASC) 5 MG TABLET    TAKE 1 TABLET (5 MG TOTAL) BY MOUTH DAILY.   CEPHALEXIN (KEFLEX) 500 MG CAPSULE    TAKE 1 CAPSULE BY MOUTH THREE TIMES A DAY   CETIRIZINE (ZYRTEC) 10 MG TABLET    Take 1 tablet (10 mg total) by mouth at bedtime.   ENSURE (ENSURE)    Take 237 mLs by mouth 2 (two) times daily between meals. Decreased Nutritional intake due to Bladder Cancer treatment and uncontrolled HIV   IPRATROPIUM-ALBUTEROL (DUONEB) 0.5-2.5 (3) MG/3ML SOLN    Take 3 mLs by nebulization every 6 (six) hours as needed (wheezing and chest tightness).   SULFAMETHOXAZOLE-TRIMETHOPRIM (BACTRIM DS) 800-160 MG TABLET    Take 1 tablet by mouth 2 (two) times daily.   VENTOLIN HFA 108 (90 BASE) MCG/ACT INHALER    INHALE 2 PUFFS BY MOUTH INTO THE LUNGS EVERY 6 HOURS AS NEEDED FOR WHEEZING OR SHORTNESS OF BREATH   VITAMIN D, ERGOCALCIFEROL, (DRISDOL) 1.25 MG (50000 UT) CAPS CAPSULE    Take 1 capsule (50,000 Units total) by mouth every 7 (seven) days.  Modified Medications   Modified Medication Previous Medication   DARUNAVIR-COBICISCTAT-EMTRICITABINE-TENOFOVIR ALAFENAMIDE (SYMTUZA)  800-150-200-10 MG TABS SYMTUZA 800-150-200-10 MG TABS      Take 1 tablet by mouth daily with breakfast.    TAKE 1 TABLET BY MOUTH DAILY WITH BREAKFAST.   MEGESTROL (MEGACE) 400 MG/10ML SUSPENSION megestrol (MEGACE) 400 MG/10ML suspension      Take 10 mLs (400 mg total) by mouth daily.    Take 10 mLs (400 mg total) by mouth daily.  Discontinued Medications   No medications on file    Subjective: [name] is in for his first visit since December of last year.  He says that he has not been doing well recently.  He has not been taking his Symtuza on a consistent basis.  He says that he has been bothered by nausea and vomiting again and sometimes vomits up his medication.  He has not been eating well and has been losing weight.  He saw his PCP last week and is now drinking Ensure 3 times daily.  He said that he has been having frequent falls because he is so weak.  Review of Systems: Review of Systems  Constitutional: Positive for malaise/fatigue and weight loss. Negative for chills and fever.  HENT: Negative for congestion and sore throat.   Respiratory: Positive for cough. Negative for sputum production and shortness of breath.   Cardiovascular: Negative for chest pain.  Gastrointestinal: Positive for constipation, nausea and vomiting. Negative for abdominal pain,  diarrhea and heartburn.  Genitourinary: Negative for dysuria.  Musculoskeletal: Negative for myalgias.  Neurological: Negative for headaches.  Psychiatric/Behavioral: Positive for depression.    Past Medical History:  Diagnosis Date  . Asthma with COPD ([place])   . Bladder cancer (H[facility])    and prostatic urethra cancer  HX TURBT'S IN [place], [place]  WITH INSTILLATION CHEMO TX'S  . Bladder tumor   . History of transfusion   . HIV disease ([place])    under care of [facility]  . Hypertension   . Nocturia   . Vitamin D deficiency 05/2019    Social History   Tobacco Use  . Smoking status: Current Every  Day Smoker    Packs/day: 0.10    Years: 17.00    Pack years: 1.70    Types: Cigarettes  . Smokeless tobacco: Never Used  . Tobacco comment: 3/day;  Substance Use Topics  . Alcohol use: Yes    Alcohol/week: 6.0 standard drinks    Types: 6 Cans of beer per week    Comment: occasional  . Drug use: Yes    Types: Marijuana    Comment: occassional    Family History  Problem Relation Age of Onset  . Cancer Mother        Gastric Cancer  . Hypertension Mother   . Heart disease Mother   . Cancer Maternal Grandmother        breast cancer     Allergies  Allergen Reactions  . Aspirin Shortness Of Breath, Nausea And Vomiting and Swelling    Health Maintenance  Topic Date Due  . COLONOSCOPY  10/05/2003  . PNA vac Low Risk Adult (1 of 2 - PCV13) 10/05/2018  . INFLUENZA VACCINE  03/05/2019  . TETANUS/TDAP  04/02/2027 (Originally 10/04/1974)  . Hepatitis C Screening  Completed  . HIV Screening  Completed    Objective:  Vitals:   05/17/19 1117 05/17/19 1118  BP: (!) 147/78   Pulse: 75   Weight:  107 lb 6.4 oz (48.7 kg)   Body mass index is 18.44 kg/m.  Physical Exam  Lab Results Lab Results  Component Value Date   WBC 3.6 05/11/2019   HGB 12.4 (L) 05/11/2019   HCT 35.8 (L) 05/11/2019   MCV 99 (H) 05/11/2019   PLT 131 (L) 05/11/2019    Lab Results  Component Value Date   CREATININE 1.00 05/11/2019   BUN 7 (L) 05/11/2019   NA 123 (L) 05/11/2019   K 4.7 05/11/2019   CL 93 (L) 05/11/2019   CO2 17 (L) 05/11/2019    Lab Results  Component Value Date   ALT 20 05/11/2019   AST 69 (H) 05/11/2019   ALKPHOS 157 (H) 05/11/2019   BILITOT 0.8 05/11/2019    Lab Results  Component Value Date   CHOL 99 (L) 05/11/2019   HDL 71 05/11/2019   LDLCALC 17 05/11/2019   TRIG 40 05/11/2019   CHOLHDL 1.4 05/11/2019   Lab Results  Component Value Date   LABRPR NON-REACTIVE 06/28/2018   HIV 1 RNA Quant (copies/mL)  Date Value  07/13/2018 960 (H)  06/28/2018 928 (H)   03/02/2018 89 (H)   CD4 T Cell Abs (/uL)  Date Value  06/28/2018 630  03/02/2018 540  07/29/2017 620     Problem List Items Addressed This Visit      High   HIV disease ([place])    I expect that his viral load will be elevated.  This may be  contributing to his recent weight loss and weakness.  I will prescribe ondansetron and have him take it 30 minutes before his Symtuza each evening before bedtime.  I will get lab work today and see him back in 2 weeks.      Relevant Medications   Darunavir-Cobicisctat-Emtricitabine-Tenofovir Alafenamide (SYMTUZA) 800-150-200-10 MG TABS   ondansetron (ZOFRAN) 4 MG tablet   Other Relevant Orders   T-helper cell (CD4)- ([name] clinic only)   HIV-1 RNA quant-no reflex-bld   RPR     Medium   Depression    I suspect that some of his weight loss may be related to chronic depression.  I will ask our community outreach nurse, [name], to reach out to him soon.        Unprioritized   Unintentional weight loss    I urged him to use ondansetron to help with nausea and will refill his megesterol.      Relevant Medications   megestrol (MEGACE) 400 MG/10ML suspension        Michel Bickers, MD [facility] for Infectious [place] [phone] pager   [phone] cell 05/17/2019, 11:46 AM

## 2019-05-17 NOTE — Assessment & Plan Note (Signed)
I expect that his viral load will be elevated.  This may be contributing to his recent weight loss and weakness.  I will prescribe ondansetron and have him take it 30 minutes before his Symtuza each evening before bedtime.  I will get lab work today and see him back in 2 weeks.

## 2019-05-17 NOTE — Telephone Encounter (Signed)
[name] Patient Advocate Encounter  Patient's medications (Megesterol, Zofran, Symtuza, Ventolin) will be shipped out on 10/16 to either arrive that Saturday or Monday the 19th. Included in the shipment will also be his Bactrim from the patient care center.

## 2019-05-17 NOTE — Assessment & Plan Note (Signed)
I urged him to use ondansetron to help with nausea and will refill his megesterol.

## 2019-05-17 NOTE — Telephone Encounter (Signed)
Much thanks.

## 2019-05-17 NOTE — Telephone Encounter (Signed)
Spoke with [name] today. He promised me that he will begin to take his medications and states he really wants to get his strength back. I told him if he is willing I would like to start back coming weekly to fill his pill box. He agreed and reassured me that he is serious now and will not miss anymore of his medications. I asked if he has taken his medications today? [name] stated he has not because he has not ate. He states his plan is to go home and cook. I told [name] that I do not believe him and I plan to bring him food to ensure he has something on his stomach so he can start taking his medications today. Visit planned for today.   [name] also has a [facility] Case Manager/[name] that is also assisting him. [facility] referral for in-home aide services has already been submitted.

## 2019-05-17 NOTE — Telephone Encounter (Signed)
Error
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
## 2019-05-17 NOTE — Assessment & Plan Note (Signed)
I suspect that some of his weight loss may be related to chronic depression.  I will ask our community outreach nurse, [name], to reach out to him soon.

## 2019-05-18 ENCOUNTER — Other Ambulatory Visit: Payer: Self-pay

## 2019-05-18 LAB — T-HELPER CELL (CD4) - (RCID CLINIC ONLY)
CD4 % Helper T Cell: 51 % (ref 33–65)
CD4 T Cell Abs: 771 /uL (ref 400–1790)

## 2019-05-18 MED FILL — ONDANSETRON HCL 4 MG TABLET: 4 | 20 days supply | Qty: 60 | Fill #0

## 2019-05-18 MED FILL — SULFAMETHOXAZOLE-TMP DS TAB: 800-160 | 10 days supply | Qty: 20 | Fill #0

## 2019-05-18 MED FILL — MEGESTROL ACET 40 MG/ML SUS: 40 | 30 days supply | Qty: 300 | Fill #0

## 2019-05-18 NOTE — Patient Outreach (Signed)
[place] [facility]) Care Management  05/18/2019  [name] 1955-09-13 [id number]   [place] and they confirmed receipt of request for personal care services. Form has been given to Kathe Becton for completion.   [name], BSW Social Worker [phone]

## 2019-05-20 ENCOUNTER — Other Ambulatory Visit: Payer: Self-pay

## 2019-05-20 ENCOUNTER — Ambulatory Visit: Payer: Self-pay

## 2019-05-20 LAB — HIV-1 RNA QUANT-NO REFLEX-BLD
HIV 1 RNA Quant: 6150 copies/mL — ABNORMAL HIGH
HIV-1 RNA Quant, Log: 3.79 Log copies/mL — ABNORMAL HIGH

## 2019-05-20 LAB — RPR: RPR Ser Ql: NONREACTIVE

## 2019-05-20 MED FILL — VENTOLIN HFA 90 MCG INHALER: 108 (90 BAS | 25 days supply | Qty: 18 | Fill #8

## 2019-05-20 NOTE — Patient Outreach (Signed)
[place] [facility]) Care Management  05/20/2019  Phil Kozar 06-17-1956 [id number]   Attempted to contact patient today to follow up on status of request for pcs and to ensure receipt of resources mailed.  Patient did not answer and voicemail box was full.  Will attempt to reach again within four business days.   [name], BSW Social Worker [phone]

## 2019-05-22 ENCOUNTER — Encounter: Payer: Self-pay | Admitting: Family Medicine

## 2019-05-24 ENCOUNTER — Other Ambulatory Visit: Payer: Self-pay

## 2019-05-24 NOTE — Patient Outreach (Signed)
[place] [facility]) Care Management  05/24/2019  [name] [birth year] [id number]   Completed request for personal care services faxed to [facility].  Will call in 48 hours to ensure receipt and that request is being processed.   [name], BSW Social Worker [phone]

## 2019-05-26 ENCOUNTER — Ambulatory Visit: Payer: Self-pay

## 2019-05-26 MED FILL — SYMTUZA 800-150-200-10 MG T: 800-150-200 | 30 days supply | Qty: 30 | Fill #1

## 2019-05-27 ENCOUNTER — Other Ambulatory Visit: Payer: Self-pay

## 2019-05-27 NOTE — Patient Outreach (Signed)
[place] [facility]) Care Management  05/27/2019  [name] 1956-01-09 [id number]   [place] regarding status of request for pcs.  Request cannot be processed due to form indicating that patient is not "medically stable."   LHC defines medically stable as "doesn't require continuous medical monitoring or changes in treatment for potential complications".  Faxed form to Kathe Becton again requesting evaluation of medical stability.    [name], BSW Social Worker [phone]

## 2019-06-02 ENCOUNTER — Ambulatory Visit: Payer: Medicare Other | Admitting: Internal Medicine

## 2019-06-05 ENCOUNTER — Telehealth: Payer: Self-pay | Admitting: *Deleted

## 2019-06-05 NOTE — Telephone Encounter (Signed)
Current Orders Effective 06/05/2019  Individualized Plan Of Care, Certification Period of  to 06/05/19 to 09/03/2019 a. Type of service(s) and care to be delivered: [facility] Nurse   Frequency and duration of service: Effective: 06/05/19: 31mo3, 3PRN's for complications with disease process/progression, medication changes or concerns b. Activity restrictions: Pt may be up as tolerated and can safely ambulate without the need for a assistive device. Pt wears glasses c. Safety Measures: Standard Precautions/Infection Control d. Service Objectives and Goals:Service Objectives are to assist the pt with HIV medication regimen adherence and staying in care with the Infectious Disease Clinic by identifying barriers to care. RN will address the barriers that are identified by the patient.   Patient continues to have unmet goals with adherence questionable. He continues to struggle with eating regularly and consistent adherence to medications. Patient centered goal is to stay in care, manage his HIV and have affordable housing. My goal is to bridge Mr Barbush with [facility] Case Management and a possible discharge from my services.   e. Equipment required: No additional equipment needs at this time f. Functional Limitations: Vision. Pt has corrective glasses that he wears g. Rehabilitation potential: Guarded h. Diet and Nutritional Needs: Regular Diet i. Medications and treatments: Medications have been reconciled and reviewed and are a part of EPIC electronic file j. Specific therapies if needed: RN k. Pertinent diagnoses: Hypertension, Asthma, Bladder Cancer with Chemo treatment, Depression l. Expected outcome: Guarded

## 2019-06-06 ENCOUNTER — Other Ambulatory Visit: Payer: Self-pay

## 2019-06-06 ENCOUNTER — Other Ambulatory Visit: Payer: Self-pay | Admitting: *Deleted

## 2019-06-06 NOTE — Patient Outreach (Signed)
[place] [facility]) Care Management  06/06/2019  Scot Shiraishi [birth year] [id number]   EMMI Join follow up, assessment  Recent [facility] referral Date:04/26/19 [facility] Referral Source:EMMI Join Referral Reason:Pt engagementscore 13. He is in need of several resources such as transportation, food, and education pertaining to his wellbeing. He also states he has trouble with walking which has caused him to fall on multiple occasions. [place] access [place]: No admissions or ED visits in the last 12 months   Outreach attempt successful to his home/mobile number    Hoarseness Mr Bartmess reports noting a change in the tone of his voice and difficulty swallowing fluids. He reports getting up from white to yellow mucus at times. He and [facility] RN CM discussed his zyrtec use. He reports that [employer] has assisted with his medication containers but he had not been compliant with taking his zyrtec "in the last three days" He was encouraged to take his Zyrtec as ordered. He reports that he has had a poor appetite and then when he does eat he has had moments of emesis after an hour. He reports his stomach starts to "boil" THN RN  CM discussed interventions to complete at home to assist with hoarseness to include resting his voice, drinking plenty of fluids, not drinking alcohol or smoking, breathing in moist air and removing mucus. He voiced understanding   Upcoming appointment 110/6/20  [facility] RN CM assisted him with calling [facility] to arrange transportation to this appointment.    Plan: [facility] RN CM will follow up with Mr Hensch within the next 21-20 days  Pt encouraged to return a call to [facility] RN CM prn  Routed note to MD  [facility] CM Care Plan Problem One     Most Recent Value  Care Plan Problem One  Impaired safety, fall risk with history of multiple falls affecting his other medical history of hypertension, HIV, bladder cancer and asthma  Role Documenting the  Problem One  Care Management Telephonic Coordinator  Care Plan for Problem One  Active  THN Long Term Goal   over the next 60 days the patient will have decreased or no falls as evidenced by patient report of decreased falls with use of DME, safety measures  THN Long Term Goal Start Date  04/28/19  Interventions for Problem One Long Term Goal  assessed for falls   THN CM Short Term Goal #1   over the next 30 days patient will vebalize a better understanding of signs and symptoms of hypertension and hypotension  THN CM Short Term Goal #1 Start Date  04/28/19  Interventions for Short Term Goal #1  assessed for s/s of hypotension   THN CM Short Term Goal #2   over the next 45 days patient will assist with identifying and use of at least two types of safety measures to prevent falls  THN CM Short Term Goal #2 Start Date  04/28/19    [facility] CM Care Plan Problem Two     Most Recent Value  Care Plan Problem Two  Social needs related to housing, transportation and possible personal care services  Role Documenting the Problem Two  Care Management [place] for Problem Two  Active  THN Long Term Goal  over the next 45 days patient will assist with identifying and use of at least two available resources to meet the transportation, housing and food insecurity needs  [facility] Long Term Goal Start Date  04/28/19  THN Long Term Goal Met Date  05/17/19  THN CM Short Term Goal #1   over the next 10 days patient will have transportation resources to assist with medical visits and community access as verbalized by the patient during follow up  [facility] CM Short Term Goal #1 Start Date  04/28/19  [facility] CM Short Term Goal #1 Met Date   05/03/19  THN CM Short Term Goal #2   over the next 14 days the patient will be able to verbalize at least 2 resources to assist with his food insecurity and housing needs in follow up calls  [facility] CM Short Term Goal #2 Start Date  04/28/19  [facility] CM Short Term Goal #2 Met  Date  05/17/19      Joelene Millin L. [name], RN, BSN, [place] Coordinator Office number [phone] Mobile number [phone]  Main THN number [phone] Fax number [phone]

## 2019-06-06 NOTE — Telephone Encounter (Signed)
I agree with his plan of care.

## 2019-06-07 ENCOUNTER — Other Ambulatory Visit: Payer: Self-pay

## 2019-06-07 NOTE — Patient Outreach (Signed)
[place] [facility]) Care Management  06/07/2019  Aristidis Adler 1955/12/09 [id number]   New request for pcs faxed to [facility] today.  Will follow up in 48 hours to ensure receipt and that assessment can be scheduled.  [name], BSW Social Worker [phone]

## 2019-06-10 ENCOUNTER — Ambulatory Visit: Payer: Medicare Other | Admitting: Family Medicine

## 2019-06-13 ENCOUNTER — Other Ambulatory Visit: Payer: Self-pay

## 2019-06-13 NOTE — Patient Outreach (Signed)
[name]  06/13/2019  [name] 10-12-1955 [id number]   Spoke with representative from [facility] who confirmed receipt of request for personal care services that was resent on 06/07/19.  Assessment is ready to be scheduled; patient will be contacted by [id number] or he can call for scheduling.  Attempted to contact patient today to follow up but he did not answer and voicemail box is full.  Will attempt to reach him again tomorrow.  [name], BSW Social Worker [phone]

## 2019-06-14 ENCOUNTER — Other Ambulatory Visit: Payer: Self-pay

## 2019-06-14 NOTE — Patient Outreach (Signed)
[place] [facility]) Care Management  06/14/2019  Matthew Dhawan [birth year] [id number]   Successful follow up call to patient today.  Informed him that [place] has received request for pcs and an assessment needs to be scheduled.  Patient stated that he has not been contacted by [facility] to schedule assessment.  Informed him that his voicemail box is full so if they attempted to call him they would not be able to leave a message.  Provided him with contact information for [facility] and encouraged him to call; I am not able to schedule the assessment on his behalf.  Will follow up before the end of the week to ensure he was able to schedule assessment.  [name], BSW Social Worker [phone]

## 2019-06-16 ENCOUNTER — Other Ambulatory Visit: Payer: Self-pay

## 2019-06-16 NOTE — Patient Outreach (Signed)
[place] [facility]) Care Management  06/16/2019  Lemmy Baquera 01-Dec-1955 [id number]   Spoke to representative at [facility] who confirmed assessment for pcs has been scheduled for tomorrow, 06/17/19 between 8:30-9:30 AM. Called Mr. Velador about this.  Encouraged him to listen out for call as assessor will not be able to leave a message due to his mailbox being full.  Will follow up with him again next week.  [name], BSW Social Worker [phone]

## 2019-06-19 NOTE — Progress Notes (Signed)
Purpose of this communication is to relay that that the set frequency for home visits this week has been changed due to a missed visit. Communication has been made with the patient to ensure needs have been met and to offer a home visit. At this time a return call has not been received before the business week is out. The Community Based Health Care Nurse plans to continue contact with the patient to offer any services or attempt to address any needs the patient expresses that is reasonable. Dr has been contacted and made aware of this change in the plan of care and how the patient's needs have been met 

## 2019-06-20 ENCOUNTER — Other Ambulatory Visit: Payer: Self-pay

## 2019-06-20 NOTE — Patient Outreach (Signed)
[place] [facility]) Care Management  06/20/2019  [name] Oct 23, 1955 [id number]   Follow up call to patient regarding assessment for pcs that was scheduled for 06/17/19.  Patient reports that assessment did occur and he was approved for services.  Patient will receive Determination Letter from [facility] indicating amount of hours he was approved for.  Informed patient that a service agency will need to be selected within 30 days of the date on the letter.  Will follow up with patient on 07/04/19.  [name], BSW Social Worker [phone]

## 2019-06-21 MED FILL — SYMTUZA 800-150-200-10 MG T: 800-150-200 | 30 days supply | Qty: 30 | Fill #0

## 2019-06-21 MED FILL — VENTOLIN HFA 90 MCG INHALER: 108 (90 BAS | 25 days supply | Qty: 18 | Fill #9

## 2019-07-04 ENCOUNTER — Other Ambulatory Visit: Payer: Self-pay

## 2019-07-04 ENCOUNTER — Ambulatory Visit: Payer: Self-pay

## 2019-07-04 NOTE — Patient Outreach (Signed)
[place] [facility]) Care Management  07/04/2019  Collins Vandenbos 09-07-1955 [id number]   Attempted to contact patient today to follow up on status of request for pcs.  No answer and voicemail box was full.  Will attempt to reach again tomorrow.  [name], BSW Social Worker [phone]

## 2019-07-05 ENCOUNTER — Telehealth: Payer: Self-pay | Admitting: *Deleted

## 2019-07-05 ENCOUNTER — Other Ambulatory Visit: Payer: Self-pay

## 2019-07-05 ENCOUNTER — Ambulatory Visit: Payer: Self-pay

## 2019-07-05 NOTE — Patient Outreach (Signed)
[place] [facility] [facility]) Care Management  07/05/2019  [name] [birth year] [id number]   Second attempt to contact patient today to follow up on status of request for pcs.  No answer and voicemail box was full.  Will attempt to reach again within four business days.  Mailed unsuccessful outreach letter.   [name], BSW Social Worker [phone]

## 2019-07-06 ENCOUNTER — Telehealth: Payer: Self-pay | Admitting: *Deleted

## 2019-07-06 NOTE — Telephone Encounter (Signed)
Received a call from [name] (pt's brother) stating him and his family are currently at Mr. Vandewater home. They plan is to clean the home and bring Mr. Muncey into the Dr's office for an appt. Mr. Haskill will not open the door. Mr. Geis will not answer my phone calls or calls from the [employer] account planner).

## 2019-07-06 NOTE — Telephone Encounter (Signed)
-----   Message from [employer] sent at 07/05/2019 12:42 PM EST ----- Regarding: RE: update on patient Thank you for the update, [name].  I have tried calling him a couple of times this week but he did not answer.  It's good that APS is involved because that's the only thing that I would be able to suggest regarding his living situation.  Unfortunately though, they are only going to be able to do so much since he is competent to make his own decisions.   ----- Message ----- From: Loma Boston, RN Sent: 07/05/2019  12:11 PM EST To: Barbaraann Faster, RN, Amber Chrismon Subject: update on patient                              Good Morning Amber, I went over to see Mr. [name] on Thanksgiving and I am concerned about his health. He looks emaciated and unkempt. The home is unsanitary and I am concerned about a possible eviction. Mr Dziadosz told me that an agency was suppose to start on Wednesday of last week. He then stated a girl (probably for [facility] service) did come and refused to enter the home. After that he received a visit from the Department of Social services (I assume APS) asking him questions about being placed in a nursing facility due to his inability to care for himself. Mr. Diguglielmo refused.  With Mr. Ito approval, I contacted his brother, [name], to make him aware of [name] health , possible APS visit, and his need for assistance in the home. His family is involved but [name] is passively resisting. He is not answering his phone, refusing to answer the door when his brother comes to check on him. His brother asked him to go to the Dr., but [name] is making excuses. [name] has a large family (some are local but most are out of state) and they are all trying to assist him.

## 2019-07-07 ENCOUNTER — Ambulatory Visit: Payer: Self-pay

## 2019-07-07 ENCOUNTER — Other Ambulatory Visit: Payer: Self-pay

## 2019-07-07 NOTE — Patient Outreach (Signed)
[place] [facility]) Care Management  07/07/2019  Li Aloi 28-Oct-1955 [id number]   Third attempt to contact patient today to follow up on status of request for pcs. No answer and voicemail box was full.   [name], BSW Social Worker [phone]

## 2019-07-08 ENCOUNTER — Other Ambulatory Visit: Payer: Self-pay | Admitting: *Deleted

## 2019-07-08 NOTE — Patient Outreach (Signed)
[place] [name]) Care Management  07/08/2019  [name] October 13, 1955 [id number]   EMMI Join Complex care patient follow up, assessment after collaboration with [facility] SW, ID RN   Recent [name] referral Date:04/26/19 [facility] Source:EMMI Join Referral Reason:Pt engagementscore 13. He is in need of several resources such as transportation, food, and education pertaining to his wellbeing. He also states he has trouble with walking which has caused him to fall on multiple occasions. [place] access [place]: No admissions or ED visits in the last 12 months   Outreach attempt successful to his home/mobile number     Collaboration with [name] SW ad ID (infectious disease) RN  [facility] RN CM received in [employer] from [place]. [name] visited [name] during Thanksgiving and he was reported to be unkempt and emaciated. His home was unsanitary. She spoke with his brother and it was reported [name] was refusing family assistance  [facility] SW has been telephonically assisting [name] with personal care services via [employer] in November 2020. [name] has been noted at intervals not answering his phone and/or his phone mailbox has been full [place] assessment for personal care services [place] [name]) was scheduled and completed on 06/17/19 between 0830-0930. He was approved for services and someone arrived around 06/29/19 but she refused to enter the apartment.  [facility] RN CM agreed to attempt to reach [name] for an earlier telephonic appointment/assessment.   Discussion with [name] [facility] RN CM spoke with [name] about the contact from [place], [place], [place] RN, about his appearance, his apartment appearance during the last visit and poor phone contact with him. He informs [facility] RN CM that on the day he was visited he was not feeling well. He reports he did not have strength to take care of himself or his apartment on  that day. He reports when he is dizzy and weak he lies down and rest.  When assessed he admits to poor appetite and oral intake. He confirms he had only water today 07/08/19 but has not eaten. He reports he will try to eat prior to taking his hs medications. He states he generally has a poor appetite  [name] was counseled about keeping his apartment clean and reminded him that his apartment manager has warned him about eviction related to an unsanitary apartment. He voiced he recalls and understands. He admits he continues to have maggots. "I may have left the trash there to long." " They still come out my sink.    [facility] RN CM discussed that this may deter personal care services from arriving to assist him. [name] informed [facility] RN CM that a male did arrive to his apartment. He reports she "came to the porch, looked in the house and told me the office would call me and left. He reports he has not received a call from "the office" but admits to having various unanswered telephone calls. He reports his brother and girlfriend visited last week but did not assist with care of his home.  [name] states he continues to have unresolved telephone issues. He was encouraged to keep his voice mailbox clear as he states he knows how to complete this task independently. He was encouraged to answer his phone for pending calls from [facility] [place] staff members and [facility]. He ws encouraged to call medical and home care staff back. He reports possibly sleeping when calls  arrived. He also states "they are giving me a hard time"    He denies recent falls  [facility] RN CM reviewed the last recommendations of [name]. [name] admits he recalls [name] informing him his weakness and weight loss may be related to chronic depression and he should take Zofran before taking his other medications like symtuza  [name] states his goal is to "go back to my doctors, get my strength back" so he will be able to take care of  himself and clean his apartment He was encouraged to have his family members assist him    Upcoming appointment None.  He previously had a 06/10/19 appointment  to see his primary care provider There is no indication that he attended this appointment per Epic He was given Allen Crosby NP number He was encouraged to make his own f/u appointment with this primary care MD prior to the next Cm contact  Plan: [name] [place] RN CM will follow up with Allen Nunn within the next 7-14 days  Pt encouraged to return a call to [facility] RN CM and 24 hour nurse call center prn He was given the 24 hour nurse call center number He was able to repeat the number back to CM   Routed note to MD  [facility] CM Care Plan Problem One     Most Recent Value  Care Plan Problem One  Impaired safety, fall risk with history of multiple falls affecting his other medical history of hypertension, HIV, bladder cancer and asthma  Role Documenting the Problem One  Care Management Telephonic Coordinator  Care Plan for Problem One  Active  [facility] Long Term Goal   over the next 60 days the patient will have decreased or no falls as evidenced by patient report of decreased falls with use of DME, safety measures  THN Long Term Goal Start Date  04/28/19  [facility] Long Term Goal Met Date  07/08/19  THN CM Short Term Goal #1   over the next 30 days patient will vebalize a better understanding of signs and symptoms of hypertension and hypotension  THN CM Short Term Goal #1 Start Date  07/08/19  [facility] CM Short Term Goal #2   over the next 45 days patient will assist with identifying and use of at least two types of safety measures to prevent falls  THN CM Short Term Goal #2 Start Date  04/28/19  [place] [name] CM Short Term Goal #2 Met Date  07/08/19    [facility] CM Care Plan Problem Two     Most Recent Value  Care Plan Problem Two  Social needs related to housing, transportation and possible personal care services  Role Documenting the Problem Two  Care Management Telephonic  Coordinator  Care Plan for Problem Two  Active  THN Long Term Goal  over the next 45 days patient will assist with identifying and use of at least two available resources to meet the transportation, housing and food insecurity needs  [facility] Long Term Goal Start Date  04/28/19  [place] Term Goal Met Date  05/17/19  THN CM Short Term Goal #1   over the next 10 days patient will have transportation resources to assist with medical visits and community access as verbalized by the patient during follow up  [facility] CM Short Term Goal #1 Start Date  04/28/19  [place] [name] CM Short Term Goal #1 Met Date   05/03/19  THN CM Short Term Goal #2   over the next  14 days the patient will be able to verbalize at least 2 resources to assist with his food insecurity and housing needs in follow up calls  [facility] CM Short Term Goal #2 Start Date  04/28/19  [place] [name] CM Short Term Goal #2 Met Date  05/17/19  THN CM Short Term Goal #3   over the next 21 days patient will attend medical appointments as noted by EMR   [facility] CM Short Term Goal #3 Start Date  07/08/19  Interventions for Short Term Goal #3  provided primary MD contact number and encouraged him to make his own primary care MD appointment prior to next CM contact      [place]. [name], RN, BSN, [place] Coordinator Office number [phone] Mobile number [phone]  Main THN number [phone] Fax number [phone]

## 2019-07-11 ENCOUNTER — Other Ambulatory Visit: Payer: Self-pay | Admitting: *Deleted

## 2019-07-11 ENCOUNTER — Ambulatory Visit: Payer: Self-pay | Admitting: *Deleted

## 2019-07-11 NOTE — Patient Outreach (Signed)
[place] [facility]) Care Management  07/11/2019  Lamaj Toepfer 30-Oct-1955 [id number]   Care coordination  [facility] RN Cm called Dr Marcell Anger MD office  Spoke with Ubaldo Glassing and assisted to get Mr Hurd a 07/21/19 0945 appointment as agreed by Mr Vallas on 07/08/19 for assistance with an appointment Will review appointment with Mr Steeley on upcoming call and check to see if his has made a follow up appointment to his primary care MD as discussed as he reported ongoing weakness but wanting to get better.  Plan [facility] RN CM willfollow up with Mr Landfair within the next 7-14 days  Kimberly L. [name], RN, BSN, [place] Coordinator Office number [phone] Mobile number [phone]  Main THN number [phone] Fax number [phone]

## 2019-07-13 ENCOUNTER — Other Ambulatory Visit: Payer: Self-pay | Admitting: *Deleted

## 2019-07-14 ENCOUNTER — Other Ambulatory Visit: Payer: Self-pay | Admitting: *Deleted

## 2019-07-14 ENCOUNTER — Other Ambulatory Visit: Payer: Self-pay

## 2019-07-14 NOTE — Patient Outreach (Signed)
[facility]) Care Management  07/14/2019  Kyandre Chludzinski 10-Apr-1956 [id number]   Care coordination- [facility] SW, Personal care services)  [facility] RN CM placed a call to Mr Shortsleeve after collaboration with [facility] SW and review of [facility] SW notes  HIPAA verified with Mr Olaiz (DOB and Address)   Mr Fleeger has been out today getting a money order to pay his rent and running errands He reports being tired from his day's activities He used [facility] today to go to his visits  Primary care MD appointment Mr Ovitt reports he has not had a return call from his primary care MD about an appointment He hs previous shared with [facility] RN CM that he would need to have 'something done, a test" before going to his appointment  After completing the call with Mr Rafe, [facility] RN CM called and spoke with Karena Addison at his primary care office who confirms when he called for his appointment he discussed positive s/s of covid and was informed he would need to be tested prior to his appointment.  Discussed the [place] for covid free testing for patient  Personal care services (PCS)  [name] called Dorris Fetch at [facility] to complete a conference call with him and Mr Igou. The pt reports he will be paying a male to help him clean his apartment on 07/15/19. Dorris Fetch and Mr Thiery agreed to have a new staff visit him on Monday 07/18/19 after Dorris Fetch calls him on 07/15/19  Mr Nager was encouraged to answer his phone at all times o ntoday and tomorrow. Mr Siemen again discussed having audio concerns with his mobile phone and needing to get a new one. He report his phone can be sitting beside him, ring once and stop  After completing the call with Dorris Fetch, Mr Cesare shared again that he has had continued issues his plumbing (kitchen and bathroom) in his one bedroom apartment "every since I have been here. There is something wrong with the pipes", " I need a new place"    Plan [facility] RN CM will follow up with Mr Brocks within 3-7 days for follow up on  covid testing, personal care services [facility]) MD appointments and transportation   [place]. [name], RN, BSN, [place] Coordinator Office number [phone] Mobile number [phone]  Main THN number [phone] Fax number [phone]

## 2019-07-14 NOTE — Patient Outreach (Signed)
[facility]) Care Management  07/14/2019  Kinston Remmick 1955-11-09 [id number]   Care coordination  Collaboration with [facility] Sw,Amber about Mr Licea personal care services pending Pt hs not returned a call to [place] after he was encouraged to do so  Reviewed with [facility] SW that pt informed [facility] CM on 07/13/19 that he has not had anyone to assist with cleaning his home but has been "cleaning some myself" Mr Prabhakar has been encouraged to get his apartment as clean as much as possible in order for a personal care services staff member to feel comfortable to enter the apartment He voiced understanding Engaged [facility] pt pending case closure by [place] RN CM will follow up with Mr Beattie within 3-7 days for follow up on MD appointments and transportation  [place]. [name], RN, BSN, [place] Coordinator Office number [phone] Mobile number [phone]  Main THN number [phone] Fax number [phone]

## 2019-07-14 NOTE — Patient Outreach (Signed)
[facility]) Care Management  07/14/2019  [name] 1956-04-27 [id number]   late entry for 07/13/19 1652  EMMI Join Complex care patient follow up- (medical appointments)  Outreach attemptsuccessful to his home/mobile number   HIPAA verified ( DOB, Address) He was able to verify his home address correctly today as [place].   Appetite/medicine [name] reports he has eaten a sandwich today only, was able to not have an emesis and reports he took his medicine as ordered.   Health status He reports feeling better on today. He reports feeling "less weak" today   He was noted as usual to have a congested cough. [name] assessed this and he denies a temperature but has been only getting up "white" sputum. [name] discussed and encouraged him to call his primary MD if he begins to notice sputum color change to yellow or green. [facility] RN CM discussed that this would indicate infection. He voiced understanding  [name] commended him for eating and taking his medicine as ordered   Primary care provider He reports he did call his primary care provider as he stated he would during the last contact with [facility] RN CM. He informs [facility] RN CM that staff at the office reports he needs to have "a test" done before he can go and "they will call him back" [facility] RN CM assessed to see if [name] had covid precaution items He reports he does have some masks in his apartment. [name] re discussed the 3 Ws (waiting, wearing a mask and washing his hands or using sanitizer) He voiced understanding   Home environment  [name] reports he had not had his family to assist in the cleaning of his apartment but he reports he has "been cleaning a little bit"  Transportation to scheduled [name] MD appointment with [name] on 07/21/19 at [place] discussed. [name] and [facility] RN CM completed a conference call to [facility] to find out it was too early ( more then 7 days) to call in for setting up  transportation. [name] will attempt to call on another day  Plans   [facility] RN CM will follow up with [name] within 4-7 days for assistance with transportation to medical appointment  [facility] CM Care Plan Problem One     Most Recent Value  Care Plan Problem One  Impaired safety, fall risk with history of multiple falls affecting his other medical history of hypertension, HIV, bladder cancer and asthma  Role Documenting the Problem One  Care Management Telephonic Coordinator  Care Plan for Problem One  Active  [facility] Long Term Goal   over the next 60 days the patient will have decreased or no falls as evidenced by patient report of decreased falls with use of DME, safety measures  THN Long Term Goal Start Date  04/28/19  [facility] Long Term Goal Met Date  07/08/19  THN CM Short Term Goal #1   over the next 30 days patient will vebalize a better understanding of signs and symptoms of hypertension and hypotension  THN CM Short Term Goal #1 Start Date  07/08/19  [facility] CM Short Term Goal #2   over the next 45 days patient will assist with identifying and use of at least two types of safety measures to prevent falls  THN CM Short Term Goal #2 Start Date  04/28/19  [facility] CM Short Term Goal #2 Met Date  07/08/19  THN CM Care Plan Problem Two     Most Recent Value  Care Plan Problem Two  Social needs related to housing, transportation and possible personal care services  Role Documenting the Problem Two  Care Management [place] for Problem Two  Active  THN Long Term Goal  over the next 45 days patient will assist with identifying and use of at least two available resources to meet the transportation, housing and food insecurity needs  [facility] Long Term Goal Start Date  04/28/19  [facility] Long Term Goal Met Date  05/17/19  THN CM Short Term Goal #1   over the next 10 days patient will have transportation resources to assist with medical visits and community access as verbalized by the patient  during follow up  [facility] CM Short Term Goal #1 Start Date  04/28/19  [facility] CM Short Term Goal #1 Met Date   05/03/19  THN CM Short Term Goal #2   over the next 14 days the patient will be able to verbalize at least 2 resources to assist with his food insecurity and housing needs in follow up calls  [facility] CM Short Term Goal #2 Start Date  04/28/19  [facility] CM Short Term Goal #2 Met Date  05/17/19  THN CM Short Term Goal #3   over the next 21 days patient will attend medical appointments as noted by EMR   [facility] CM Short Term Goal #3 Start Date  07/08/19      Joelene Millin L. [name], RN, BSN, [place] Coordinator Office number [phone] Mobile number [phone]  Main THN number [phone] Fax number [phone]

## 2019-07-14 NOTE — Patient Outreach (Signed)
[facility]) Care Management  07/14/2019  Luvern Finkbiner [birth year] [id number]   [place] to determine provider that patient selected for pcs.   Talked to [facility] with [facility]. He reported that he has an aide who MAY be willing to work with patient during current condition of his home.  Dorris Fetch said that he could not guarantee this without the aide going to patient's home and making this decision. Dorris Fetch stated that he has attempted to contact patient multiple times but patient has not answered and voicemail box is full.   Per Dorris Fetch, it would be most ideal if condition of patient's home can be improved because it is more likely that an aide can be found that is willing to assist.  Dorris Fetch stated that patient can contact him directly at [phone] to discuss further.  Attempted to contact patient to provide this update and encourage him to contact Jay[place].  No answer and voicemail box is full.   [facility] Social Work case at this time due to four unsuccessful outreach calls and no response from patient to letter mailed on 07/05/19.   [name], BSW Social Worker [phone]

## 2019-07-15 ENCOUNTER — Other Ambulatory Visit: Payer: Self-pay

## 2019-07-15 ENCOUNTER — Other Ambulatory Visit: Payer: Self-pay | Admitting: *Deleted

## 2019-07-15 NOTE — Patient Outreach (Signed)
[name]) Care Management  07/15/2019  [name] [birth year] [id number]    EMMI Join Complex care patient follow up, for MD appointment with [facility] transport, phone, PCS  Recent [facility] referral Date:04/26/19 [name] Source:EMMI Join Referral Reason:Pt engagementscore 13. He is in need of several resources such as transportation, food, and education pertaining to his wellbeing. He also states he has trouble with walking which has caused him to fall on multiple occasions. [place] access [name]: No admissions or ED visits in the last 12 months   Outreach attemptsuccessful to his home/mobile number  HIPAA verified   MD appointment/transportation [facility] RN CM discussed with [name] the contact with [facility] at Dr Clovis Riley office, covid assessment and covid testing at [facility] testing center [name] has not been tested for covid, has limited interactions with the public related to his medical conditions.  [name] is willing to take a covid test prn   [facility] RN CM and [name] called [facility] ans spoke with [name] to set up transportation services for his July 21 2019 0945 [facility] with [name] [name] asked questions of [name] after she read the [facility] covid precaution regulations  Home environment and Personal care services [facility])  [name] reports he has not had a call from [name] at this time to set up his home visit on Monday [name] was reminded to answer his phone at all times [name] reports a call from the male to assist in his home cleaning to report a delay in arrival [name] reports he has been cleaning all day since he woke up  Phone concerns  [facility] RN CM discussed her consult with [facility] SW about a possible resource for a new phone with [employer]  [name] reports getting his phone from [name] over a year ago  Discussed with [name] the [facility] SW case closure after he did not return a call to [facility] SW He  voiced understanding and regret. He reports he was "so busy yesterday. I forgot"    Plan [facility] RN CM will follow up with [name] within 3-7 days for follow up his personal care services home visit and further assessment  Routed note to MD    [place]. Allen Hamman, RN, BSN, [place] Coordinator Office number [phone] Mobile number [phone]  Main THN number [phone] Fax number [phone]

## 2019-07-19 ENCOUNTER — Other Ambulatory Visit: Payer: Self-pay | Admitting: Pharmacist

## 2019-07-19 ENCOUNTER — Other Ambulatory Visit: Payer: Self-pay

## 2019-07-19 ENCOUNTER — Other Ambulatory Visit: Payer: Self-pay | Admitting: *Deleted

## 2019-07-19 DIAGNOSIS — J45909 Unspecified asthma, uncomplicated: Secondary | ICD-10-CM

## 2019-07-19 NOTE — Patient Outreach (Signed)
[facility]) Care Management  07/19/2019  [name] 1956/04/10 [id number]   Follow up with EMMI JoinComplex care patientfor PCS  Recent [facility] referral Date:04/26/19 [facility] Source:EMMI Join Referral Reason:Pt engagementscore 13. He is in need of several resources such as transportation, food, and education pertaining to his wellbeing. He also states he has trouble with walking which has caused him to fall on multiple occasions. [place] access [place]: No admissions or ED visits in the last 12 months   Outreach attemptsuccessful to his home/mobile number after frequent rings  HIPAA verified by [name]    Personal care services [name] reports his new male Personal care services (PCS) aide started at 0830 on  Monday 07/20/19  He will be visiting 5 days a week Monday - Friday [name] reports he had cleaned his apartment prior to the arrival of the Personal care services [facility]) aide but since then the aide has assisted in cleaning each day [name] can not recall the name of the aide at this time [name] has already shared with the aide that he has an appointment on 07/21/19 0945 to [name], [name] office The aide and [name] have agreed the aide will arrive to assist in getting [name] ready for this appointment [facility] RN CM discussed with [name] to ask the aide to assist with [name] phone management [name] states " I am already starting to check it out myself"   Hypertension (HTN)  [name] was reminded about checking his BP to prevent hypotension and hypertension episodes. He has not checked his BP today but confirms he does have a BP cuff.  [name] denies any dizziness.  Appetite [name] reports being able to "hold down his pills" today and "a sandwich" about 1130 - 1200 noon [name] was asked about having food in his home. He informs [facility] RN CM that he has some but will attempt to go grocery shopping soon  via [employer].   Falls [name] admits today that he had a fall "a couple of days ago" when he was getting off his chair to go to the bathroom He fell in the floor and hit his arm. He denies pain of the arm today   Future Home health services [facility] RN CM and [name] discussed Home health therapies to assist with falls and safety. He had home therapies after his back and knee surgeries and found it to be beneficial. He voices he would consider home health therapies again to assist with his mobility and fall concerns  Plans [facility] RN CM will follow up with [name] in 7-14 business days to further assess for home needs for his major medical conditions  [facility] CM Care Plan Problem One     Most Recent Value  Care Plan Problem One  Impaired safety, fall risk with history of multiple falls affecting his other medical history of hypertension, HIV, bladder cancer and asthma  Role Documenting the Problem One  Care Management Telephonic Coordinator  Care Plan for Problem One  Active  THN Long Term Goal   over the next 60 days the patient will have decreased or no falls as evidenced by patient report of decreased falls with use of DME, safety measures  THN Long Term Goal Start Date  04/28/19  [facility] Long Term Goal Met Date  07/08/19  THN CM Short Term Goal #1   over the next 30 days patient  will vebalize a better understanding of signs and symptoms of hypertension and hypotension  THN CM Short Term Goal #1 Start Date  07/08/19  Interventions for Short Term Goal #1  reminded to start checking his BP to prevent hypotension and hypertension episodes. He has not checked his BP today but confirms he does have a BP cuff.   THN CM Short Term Goal #2   over the next 45 days patient will assist with identifying and use of at least two types of safety measures to prevent falls  THN CM Short Term Goal #2 Start Date  04/28/19  [facility] CM Short Term Goal #2 Met Date  07/08/19    [facility] CM Care Plan Problem Two     Most Recent Value  Care  Plan Problem Two  Social needs related to housing, transportation and possible personal care services  Role Documenting the Problem Two  Care Management Telephonic Coordinator  Care Plan for Problem Two  Active  THN Long Term Goal  over the next 45 days patient will assist with identifying and use of at least two available resources to meet the transportation, housing and food insecurity needs  [facility] Long Term Goal Start Date  04/28/19  [facility] Long Term Goal Met Date  05/17/19  THN CM Short Term Goal #1   over the next 10 days patient will have transportation resources to assist with medical visits and community access as verbalized by the patient during follow up  [facility] CM Short Term Goal #1 Start Date  04/28/19  [facility] CM Short Term Goal #1 Met Date   05/03/19  THN CM Short Term Goal #2   over the next 14 days the patient will be able to verbalize at least 2 resources to assist with his food insecurity and housing needs in follow up calls  [facility] CM Short Term Goal #2 Start Date  04/28/19  [facility] CM Short Term Goal #2 Met Date  05/17/19  THN CM Short Term Goal #3   over the next 21 days patient will attend medical appointments as noted by EMR   [facility] CM Short Term Goal #3 Start Date  07/08/19  Interventions for Short Term Goal #3  reminded of his [name] appointment 07/21/19 Encouragd to attend this appointment      Joelene Millin L. [name], RN, BSN, [place] Coordinator Office number [phone] Mobile number [phone]  Main THN number [phone] Fax number [phone]

## 2019-07-20 ENCOUNTER — Other Ambulatory Visit: Payer: Self-pay | Admitting: *Deleted

## 2019-07-20 MED FILL — VENTOLIN HFA 90 MCG INHALER: 108 (90 BAS | 25 days supply | Qty: 18 | Fill #0

## 2019-07-20 NOTE — Patient Outreach (Signed)
  [place] [facility]) Care Management  07/20/2019  [name] 30-Sep-1955 [id number]   Care coordination  [facility] RN CM collaborated with [place] for possible resources for [name]  [facility] RN CM called and spoke with Dorris Fetch at the Personal care services [facility])  About pt home needs (food, clothing) pending a return call from [place] the [place] will follow up with [name] within 7-14 business days for f/u on [name] appointment Assess further needs  [place] L. [name], RN, BSN, [place] Coordinator Office number [phone] Mobile number [phone]  Main THN number [phone] Fax number [phone]

## 2019-07-21 ENCOUNTER — Ambulatory Visit (INDEPENDENT_AMBULATORY_CARE_PROVIDER_SITE_OTHER): Payer: Medicare Other | Admitting: Internal Medicine

## 2019-07-21 ENCOUNTER — Other Ambulatory Visit: Payer: Self-pay

## 2019-07-21 DIAGNOSIS — R296 Repeated falls: Secondary | ICD-10-CM | POA: Diagnosis not present

## 2019-07-21 DIAGNOSIS — B20 Human immunodeficiency virus [HIV] disease: Secondary | ICD-10-CM | POA: Diagnosis present

## 2019-07-21 DIAGNOSIS — R634 Abnormal weight loss: Secondary | ICD-10-CM | POA: Diagnosis not present

## 2019-07-21 MED FILL — SYMTUZA 800-150-200-10 MG T: 800-150-200 | 30 days supply | Qty: 30 | Fill #1

## 2019-07-21 NOTE — Progress Notes (Signed)
Joelene Millin, can you share the contact information for the [facility] service?  I would like to coordinate with [name] to see if he can remind Mr. Kowal to take his medications and also let me know when refills are needed.

## 2019-07-21 NOTE — Progress Notes (Signed)
Patient Active Problem List   Diagnosis Date Noted  . HIV disease ([place]) 04/18/2015    Priority: High  . Dysuria 05/29/2015    Priority: Medium  . Depression 04/19/2015    Priority: Medium  . 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 Malignant neoplasm of bladder ([place]) 03/30/2015    Priority: Medium  . Frequent falls 07/21/2019  . Abdominal pain 02/19/2017  . Exacerbation of asthma 09/09/2016  . Acute respiratory failure with hypoxia ([place]) 09/09/2016  . Unintentional weight loss 07/01/2016  . Anorexia 03/11/2016  . Syncope 10/31/2015  . HTN (hypertension) 03/30/2015  . Asthma, chronic 03/30/2015    Patient's Medications  New Prescriptions   No medications on file  Previous Medications   AMLODIPINE (NORVASC) 5 MG TABLET    TAKE 1 TABLET (5 MG TOTAL) BY MOUTH DAILY.   CEPHALEXIN (KEFLEX) 500 MG CAPSULE    TAKE 1 CAPSULE BY MOUTH THREE TIMES A DAY   CETIRIZINE (ZYRTEC) 10 MG TABLET    Take 1 tablet (10 mg total) by mouth at bedtime.   DARUNAVIR-COBICISCTAT-EMTRICITABINE-TENOFOVIR ALAFENAMIDE (SYMTUZA) 800-150-200-10 MG TABS    Take 1 tablet by mouth daily with breakfast.   ENSURE (ENSURE)    Take 237 mLs by mouth 2 (two) times daily between meals. Decreased Nutritional intake due to Bladder Cancer treatment and uncontrolled HIV   IPRATROPIUM-ALBUTEROL (DUONEB) 0.5-2.5 (3) MG/3ML SOLN    Take 3 mLs by nebulization every 6 (six) hours as needed (wheezing and chest tightness).   MEGESTROL (MEGACE) 400 MG/10ML SUSPENSION    Take 10 mLs (400 mg total) by mouth daily.   ONDANSETRON (ZOFRAN) 4 MG TABLET    Take 1 tablet (4 mg total) by mouth every 8 (eight) hours as needed for nausea or vomiting.   SULFAMETHOXAZOLE-TRIMETHOPRIM (BACTRIM DS) 800-160 MG TABLET    Take 1 tablet by mouth 2 (two) times daily.   VENTOLIN HFA 108 (90 BASE) MCG/ACT INHALER    INHALE 2 PUFFS BY MOUTH INTO THE LUNGS EVERY 6 HOURS AS NEEDED FOR WHEEZING OR SHORTNESS OF BREATH     VITAMIN D, ERGOCALCIFEROL, (DRISDOL) 1.25 MG (50000 UT) CAPS CAPSULE    Take 1 capsule (50,000 Units total) by mouth every 7 (seven) days.  Modified Medications   No medications on file  Discontinued Medications   No medications on file    Subjective: [name] is in for his routine HIV follow-up visit.  He says that he is doing much better.  He says that he has gained a few pounds although our scales do not show that.  He says that his appetite has improved and he is not having any problem with nausea or vomiting.  He seems to waver when I ask if he has been missing doses of his Symtuza.  He initially said that he did not think he had been missing doses but then said sometimes he falls asleep at night before taking it.  He asked if he could come back in several weeks and get his blood work done after he has been taking it more regularly.  He has had several falls recently.  He denies feeling dizzy but says that he sometimes feels unstable when he is walking in his apartment.  He uses his cane almost all of the time.  Review of Systems: Review of Systems  Constitutional: Positive for weight loss. Negative for fever.  Respiratory: Negative for cough and shortness  of breath.   Cardiovascular: Negative for chest pain.  Gastrointestinal: Negative for abdominal pain, diarrhea, nausea and vomiting.  Musculoskeletal: Positive for falls.  Neurological: Positive for weakness. Negative for dizziness.  Psychiatric/Behavioral: Negative for depression.    Past Medical History:  Diagnosis Date  . Anorexia   . Asthma with COPD ([place])   . Bladder cancer (HCC)    and prostatic urethra cancer  HX TURBT'S IN [place], [place]  WITH INSTILLATION CHEMO TX'S  . Bladder tumor   . History of transfusion   . HIV disease ([place])    under care of [place] [facility]  . Hypertension   . Nocturia   . Vitamin D deficiency     Social History   Tobacco Use  . Smoking status: Current Every Day  Smoker    Packs/day: 0.10    Years: 17.00    Pack years: 1.70    Types: Cigarettes  . Smokeless tobacco: Never Used  . Tobacco comment: 3/day;  Substance Use Topics  . Alcohol use: Yes    Alcohol/week: 6.0 standard drinks    Types: 6 Cans of beer per week    Comment: occasional  . Drug use: Yes    Types: Marijuana    Comment: occassional    Family History  Problem Relation Age of Onset  . Cancer Mother        Gastric Cancer  . Hypertension Mother   . Heart disease Mother   . Cancer Maternal Grandmother        breast cancer     Allergies  Allergen Reactions  . Aspirin Shortness Of Breath, Nausea And Vomiting and Swelling    Health Maintenance  Topic Date Due  . COLONOSCOPY  10/05/2003  . PNA vac Low Risk Adult (1 of 2 - PCV13) 10/05/2018  . TETANUS/TDAP  04/02/2027 (Originally 10/04/1972)  . INFLUENZA VACCINE  Completed  . Hepatitis C Screening  Completed  . HIV Screening  Completed    Objective:  Vitals:   07/21/19 0848  BP: (!) 162/60  Pulse: 70  Temp: (!) 97.5 F (36.4 C)  TempSrc: Oral  Weight: 107 lb (48.5 kg)   Body mass index is 18.37 kg/m.  Physical Exam Constitutional:      Comments: He is in much better spirits and more talkative today than on his last visit.  His weight is unchanged since his last visit 2 months ago.  He has lost 16 pounds over the past year.  Cardiovascular:     Rate and Rhythm: Normal rate and regular rhythm.     Heart sounds: No murmur.  Pulmonary:     Effort: Pulmonary effort is normal.     Breath sounds: Normal breath sounds.  Abdominal:     Palpations: Abdomen is soft.     Tenderness: There is no abdominal tenderness.  Skin:    Findings: No rash.  Neurological:     General: No focal deficit present.     Comments: He walks slowly with a shuffling gait.  Psychiatric:        Mood and Affect: Mood normal.     Lab Results Lab Results  Component Value Date   WBC 3.6 05/11/2019   HGB 12.4 (L) 05/11/2019   HCT  35.8 (L) 05/11/2019   MCV 99 (H) 05/11/2019   PLT 131 (L) 05/11/2019    Lab Results  Component Value Date   CREATININE 1.00 05/11/2019   BUN 7 (L) 05/11/2019   NA 123 (L)  05/11/2019   K 4.7 05/11/2019   CL 93 (L) 05/11/2019   CO2 17 (L) 05/11/2019    Lab Results  Component Value Date   ALT 20 05/11/2019   AST 69 (H) 05/11/2019   ALKPHOS 157 (H) 05/11/2019   BILITOT 0.8 05/11/2019    Lab Results  Component Value Date   CHOL 99 (L) 05/11/2019   HDL 71 05/11/2019   LDLCALC 17 05/11/2019   TRIG 40 05/11/2019   CHOLHDL 1.4 05/11/2019   Lab Results  Component Value Date   LABRPR NON-REACTIVE 05/17/2019   HIV 1 RNA Quant (copies/mL)  Date Value  05/17/2019 6,150 (H)  07/13/2018 960 (H)  06/28/2018 928 (H)   CD4 T Cell Abs (/uL)  Date Value  05/17/2019 771  06/28/2018 630  03/02/2018 540     Problem List Items Addressed This Visit      High   HIV disease ([place])    Suspect that he is still having great difficulty remembering to take his medications consistently.  I will check lab work today and see him back in 2 months.      Relevant Orders   T-helper cell (CD4)- ([name] clinic only)   VL     Unprioritized   Unintentional weight loss    He says that his appetite has improved but I am concerned that he may not be getting enough food to eat.  He says that he can call [employer] and they will come take him to the grocery store.  He does not recall when he went the last time.      Frequent falls    We are going to see if we can get him a rolling walker.      Relevant Orders   Walker rolling        Michel Bickers, MD [facility] for [place] [phone] pager   [phone] cell 07/21/2019, 9:28 AM

## 2019-07-21 NOTE — Assessment & Plan Note (Signed)
Suspect that he is still having great difficulty remembering to take his medications consistently.  I will check lab work today and see him back in 2 months.

## 2019-07-21 NOTE — Telephone Encounter (Signed)
Opened in ERROR

## 2019-07-21 NOTE — Assessment & Plan Note (Signed)
He says that his appetite has improved but I am concerned that he may not be getting enough food to eat.  He says that he can call [employer] and they will come take him to the grocery store.  He does not recall when he went the last time.

## 2019-07-21 NOTE — Progress Notes (Signed)
He made it to his appt. I am so glad

## 2019-07-21 NOTE — Assessment & Plan Note (Signed)
We are going to see if we can get him a rolling walker.

## 2019-07-22 ENCOUNTER — Other Ambulatory Visit: Payer: Self-pay

## 2019-07-22 ENCOUNTER — Other Ambulatory Visit: Payer: Self-pay | Admitting: *Deleted

## 2019-07-22 DIAGNOSIS — R296 Repeated falls: Secondary | ICD-10-CM

## 2019-07-22 LAB — T-HELPER CELL (CD4) - (RCID CLINIC ONLY)
CD4 % Helper T Cell: 40 % (ref 33–65)
CD4 T Cell Abs: 655 /uL (ref 400–1790)

## 2019-07-22 NOTE — Patient Outreach (Addendum)
[facility]) Care Management  07/22/2019  Graysyn Bache 1955/11/21 [id number]   Care coordination-  Follow up [name]  Appointment, Further assessment of home needs, home health [facility]) Physical therapy (PT)   [name] reviewed 07/21/19 [name] MD notes for Mr Pantoja Noted the 16 lb weight loss this year, the concern that Mr Moree is not compliant with medicine administration (especially at night- causing lower T cell lab values)  [facility] RN CM sent an Epic in basket message requesting assistance from [name] MD , Megan Salon for home health PT orders   Outreach to patient/Personal care services (PCS) aide  [facility] RN CM called Mr [place] He did not answer on the first attempt after 10 rings and his phone went to voice message, stating his name and that his mail box was full Mr Vanlanen answered on the second call attempt but indicates that his phone had rung but cut off before he could answer it. He was able to verify HIPAA  Today He informs [facility] RN CM that he has been incorrectly providing his address number as 1914  He clarifies today that the address is [address] [place] [address] He reports this week [name] assisted with correcting him.  [name] reviewed his [name] MD appointment. He is aware of [name] wanting him to have a rolling walker. [name] discussed the Epic in basket message requesting assistance from [name] MD , Megan Salon for home health [facility]) Physical therapy (PT) orders. He is still agreeable for [facility] PT services   [facility] RN CM was given permission by Mr Piscitello to speak with [name] the [place] care services [facility]) aide. [name] thanked [name] for working with Mr Stephanie.  [name] was able to assess some home needs for Mr Friedt to include clothing, toiletries, food, and  IADLs (instrumental Activities of Daily Living) items [facility] RN CM reviewed goals for Mr Rahimi related to home health, DME and medication compliance (especially at night) with [name]. It is noted Mr  Phifer does not have a pill box/container/reminder in the home.  [name] left her contact numbers with the Personal care services (PCS) aide  Update to [name] RN [facility] RN CM sent an in basket message to update A McNeil on the Epic in [employer] requesting assistance from [name] MD , Megan Salon for home health PT orders and provided the contact number for the [facility] aide  Appointment September 20 2019 11 am F/u with [name]   Plan [facility] RN CCM will follow up with Mr [place] within 7-14 business days for f/u on [name] appointment  [facility] RN CM will continue to collaborate with [name] RN, MDs and the Personal care services (PCS) aide prn   [facility] CM Care Plan Problem One     Most Recent Value  Care Plan Problem One  Impaired safety, fall risk with history of multiple falls affecting his other medical history of hypertension, HIV, bladder cancer and asthma  Role Documenting the Problem One  Care Management Telephonic Coordinator  Care Plan for Problem One  Active  [facility] Long Term Goal   over the next 60 days the patient will have decreased or no falls as evidenced by patient report of decreased falls with use of DME, safety measures  THN Long Term Goal Start Date  04/28/19  [facility] Long Term Goal Met Date  07/08/19  THN CM Short Term Goal #1   over  the next 30 days patient will vebalize a better understanding of signs and symptoms of hypertension and hypotension  THN CM Short Term Goal #1 Start Date  07/08/19  Interventions for Short Term Goal #1  Not assessed during this call will address in future call  THN CM Short Term Goal #2   over the next 45 days patient will assist with identifying and use of at least two types of safety measures to prevent falls  THN CM Short Term Goal #2 Start Date  04/28/19  [facility] CM Short Term Goal #2 Met Date  07/08/19  THN CM Short Term Goal #3  over the next 21 days pt will receive a rolling walker and HH PT services to assist with mobility, strengthening and falls as evidence  by EMR documentation and verbalization that DME and HHPT services are present by patient/aide  [facility] CM Short Term Goal #3 Start Date  07/22/19  Interventions for Short Tern Goal #3  sent an Epic in basket message requesting assistance from [name] MD , Megan Salon for home health PT orders, reviewed [name] MD 07/21/19 note to indicate assisting pt with RW    [facility] CM Care Plan Problem Two     Most Recent Value  Care Plan Problem Two  Social needs related to housing, transportation and possible personal care services  Role Documenting the Problem Two  Care Management Telephonic Coordinator  Care Plan for Problem Two  Active  THN Long Term Goal  over the next 45 days patient will assist with identifying and use of at least two available resources to meet the transportation, housing and food insecurity needs  [facility] Long Term Goal Start Date  04/28/19  [facility] Long Term Goal Met Date  05/17/19  THN CM Short Term Goal #1   over the next 10 days patient will have transportation resources to assist with medical visits and community access as verbalized by the patient during follow up  [facility] CM Short Term Goal #1 Start Date  04/28/19  [facility] CM Short Term Goal #1 Met Date   05/03/19  THN CM Short Term Goal #2   over the next 14 days the patient will be able to verbalize at least 2 resources to assist with his food insecurity and housing needs in follow up calls  [facility] CM Short Term Goal #2 Start Date  04/28/19  [facility] CM Short Term Goal #2 Met Date  05/17/19  THN CM Short Term Goal #3   over the next 21 days patient will attend medical appointments as noted by EMR   [facility] CM Short Term Goal #3 Start Date  07/08/19  [facility] CM Short Term Goal #3 Met Date  07/22/19    [facility] CM Care Plan Problem Three     Most Recent Value  Care Plan Problem Three  medication compliance  Role Documenting the Problem Three  Care Management Telephonic [place] for Problem Three  Active  THN Long Term Goal   over the next 75 days  patient/pcs aide will report increased compliance with medication intake as orderd as evidence by verbaliztion during contacts and noted WNL lab values   THN Long Term Goal Start Date  07/22/19  Interventions for Problem Three Long Term Goal  Reviewed [name] MD note, discussed iwth pt/pcs aide, to offer pill container/box        Burlin Mcnair L. [name], RN, BSN, [place] Coordinator Office number [phone] Mobile number [phone]  [place] number [phone] Fax number [phone]

## 2019-07-25 LAB — HIV-1 RNA QUANT-NO REFLEX-BLD
HIV 1 RNA Quant: 99100 {copies}/mL — ABNORMAL HIGH
HIV-1 RNA Quant, Log: 5 {Log_copies}/mL — ABNORMAL HIGH

## 2019-07-26 ENCOUNTER — Telehealth: Payer: Self-pay | Admitting: *Deleted

## 2019-07-26 NOTE — Progress Notes (Signed)
I really appreciate this update.I will give [name] a call to see if  I can drop by [name] home today. I would love to  show [name] which medications [name] is suppose to take and also offer a weekly pillbox.

## 2019-07-26 NOTE — Progress Notes (Signed)
He could also [place] bed linen and pillows if that is an option.

## 2019-07-26 NOTE — Telephone Encounter (Signed)
Call made to [name] without an answer. I would like to make a home visit. [name] viral load is elevated and I would like to discuss medication adherence face to face. Call placed to [facility]) and medication (Symtuza) was last filled on 07/21/19 and every month for the last 3 months

## 2019-07-26 NOTE — Progress Notes (Signed)
He is a very small framed man. So I would think small. He is about 5'4". He typically wears collar shirts and khaki style pants. I always see him in darker colors. I'm sure he could use underwear, under shirts and socks.

## 2019-07-28 ENCOUNTER — Other Ambulatory Visit: Payer: Self-pay | Admitting: *Deleted

## 2019-07-28 ENCOUNTER — Other Ambulatory Visit: Payer: Self-pay

## 2019-07-28 ENCOUNTER — Encounter: Payer: Self-pay | Admitting: *Deleted

## 2019-07-28 NOTE — Patient Outreach (Addendum)
[facility]) Care Management  07/28/2019  [name] [birth year] [id number]    Home visit with EMMI JoinComplex care patientfor PCS  Recent [facility] referral Date:04/26/19 [facility] Source:EMMI Join Referral Reason:Pt engagementscore 13. He is in need of several resources such as transportation, food, and education pertaining to his wellbeing. He also states he has trouble with walking which has caused him to fall on multiple occasions. [place] access [place]: No admissions or ED visits in the last 12 months   Home visit successful HIPAA verified by [name] Consent form completed  [facility] RN CM visited [name] at his listed apartment [name] appeared to be clean but thin and fragile. He confirms weight loss this year. His clothes were clean but were a size too large.  He and [name] followed covid precautions to include a mask for the patient and a mask, face shield and clothing covering for the RN CM plus social distancing and sanitizer.  He greeted [facility] RN CM and expressed lots of appreciation for the home visit and support offered by [facility] CM staff members  [facility] RN CM noted a smell of smoke and that [name] began to burn an incense during the visit. His living room area appeared clean except near the couch area where he was sitting there were a lot of items to include food, papers and some toiletries [name] reports a visit from his personal care services aide prior to [facility] RN C's arrival. He reports further planned visits today from the aide and Infectious disease RN.   He and [facility] RN CM discussed his home environment especially the noted small bugs populating the kitchen area. [name] shared that his apartment complex staff preferred not to "spray because of my asthma". He reports his interest in getting a powder for a hardware store to place around the perimeter of his apartment. He discussed noting increased bugs after  apartments beside him have been treated  After review with [facility] leadership [facility] RN CM discussed with [name] a possible plan to assist that may include him leaving the apartment for a period of time to allow the bug treatment to occur. [name] asked questions and answers were provided about this process. Pending his agreement to the plan.  [name] was noted to ambulate with some difficulty using his cane. [name] was noted with a minimal amount of audible vocal congestion that was better with clearing of his throat. This has also been noted during telephonic contact  He denies any dizziness. Discussed hypotension and hypertension and he verbalized understanding the differences in hypotension and hypertension and safety measures .   [name] telephone reviewed. He continues audio issues. He demonstrates difficulty with operating the mobile phone. [name] attempted to examine the mobile phone for correction of the audio without success. He discussed preference to continue with a flip phone.    Plans to follow up with [name] with in the next 14-21 days   Pt encouraged to return a call to [facility] RN CM prn  Routed note to MDs/NP/PA  [facility] CM Care Plan Problem One     Most Recent Value  Care Plan Problem One  Impaired safety, fall risk with history of multiple falls affecting his other medical history of hypertension, HIV, bladder cancer and asthma  Role Documenting the Problem One  Care Management Telephonic Coordinator  Care Plan for Problem One  Active  [facility] Long  Term Goal   over the next 60 days the patient will have decreased or no falls as evidenced by patient report of decreased falls with use of DME, safety measures  [facility] Long Term Goal Start Date  04/28/19  [facility] Long Term Goal Met Date  07/08/19  [facility] CM Short Term Goal #1   over the next 30 days patient will vebalize a better understanding of signs and symptoms of hypertension and hypotension  [facility]N CM Short Term Goal #1 Start Date  07/08/19  [facility]  CM Short Term Goal #1 Met Date  07/28/19  [facility] CM Short Term Goal #2   over the next 45 days patient will assist with identifying and use of at least two types of safety measures to prevent falls  [facility]N CM Short Term Goal #2 Start Date  04/28/19  [facility] CM Short Term Goal #2 Met Date  07/08/19  [facility] CM Short Term Goal #3  over the next 21 days pt will receive a rolling walker and HH PT services to assist with mobility, strengthening and falls as evidence by EMR documentation and verbalization that DME and HHPT services are present by patient/aide  [facility] CM Short Term Goal #3 Start Date  07/22/19  Interventions for Short Tern Goal #3  assesed  home and pt for rolling walker only use of cane noted     [facility]N CM Care Plan Problem Two     Most Recent Value  Care Plan Problem Two  Social needs related to housing, transportation and possible personal care services  Role Documenting the Problem Two  Care Management Telephonic Coordinator  Care Plan for Problem Two  Active  [facility]N Long Term Goal  over the next 45 days patient will assist with identifying and use of at least two available resources to meet the transportation, housing and food insecurity needs  [facility] Long Term Goal Start Date  04/28/19  [facility] Long Term Goal Met Date  05/17/19  [facility] CM Short Term Goal #1   over the next 10 days patient will have transportation resources to assist with medical visits and community access as verbalized by the patient during follow up  [facility] CM Short Term Goal #1 Start Date  04/28/19  [facility] CM Short Term Goal #1 Met Date   05/03/19  [facility]N CM Short Term Goal #2   over the next 14 days the patient will be able to verbalize at least 2 resources to assist with his food insecurity and housing needs in follow up calls  [facility] CM Short Term Goal #2 Start Date  04/28/19  [facility] CM Short Term Goal #2 Met Date  05/17/19  [facility]N CM Short Term Goal #3   over the next 21 days patient will attend medical appointments as noted by EMR   [facility] CM Short Term  Goal #3 Start Date  07/08/19  [facility] CM Short Term Goal #3 Met Date  07/22/19    [facility] CM Care Plan Problem Three     Most Recent Value  Care Plan Problem Three  medication compliance  Role Documenting the Problem Three  Care Management Telephonic Coordinator  Care Plan for Problem Three  Active  [facility]N Long Term Goal   over the next 75 days patient/pcs aide will report increased compliance with medication intake as orderd as evidence by verbaliztion during contacts and noted WNL lab values   [facility] Long Term Goal Start Date  07/22/19  Interventions for Problem Three Long Term Goal  assessed for compliance  with home care and medications encouraged continued compliance        Wilbur Labuda L. [name], RN, BSN, [place] Coordinator Office number [phone] Mobile number [phone]  Main [facility] number [phone] Fax number [phone]

## 2019-08-02 ENCOUNTER — Other Ambulatory Visit: Payer: Self-pay

## 2019-08-02 NOTE — Progress Notes (Signed)
Prior to visit, [name] agreed and reassured me that he is serious now and will not miss anymore of his medications. I asked if he has taken his medications today? [name] stated he has not because he has not ate.  During today's visit I have provided Allen. [name] with a meal to ensure he has food on his stomach. Allen. Vredeveld states he cannot locate his medication for pillbox refills. Call placed to [name] and medications to be delivered to [name]. Physically Allen Dimuzio appears weak and in poor health. He declines any additional assistance at this time.

## 2019-08-04 ENCOUNTER — Other Ambulatory Visit: Payer: Self-pay | Admitting: *Deleted

## 2019-08-04 ENCOUNTER — Other Ambulatory Visit: Payer: Self-pay

## 2019-08-04 NOTE — Patient Outreach (Signed)
[facility]) Care Management  08/04/2019  Carthel Castille March 23, 1956 [id number]   EMMI JoinComplex care patientforPCS  Recent [facility] referral Date:04/26/19 [facility] Source:EMMI Join Referral Reason:Pt engagementscore 13. He is in need of several resources such as transportation, food, and education pertaining to his wellbeing. He also states he has trouble with walking which has caused him to fall on multiple occasions. [place] access [place]: No admissions or ED visits in the last 12 months   Outreach successful HIPAA verifiedby Mr Xia   Mr Morrical reports he is doing good today This morning side was hurting but he took Tylenol and he feels better Reports no pain at the time of the call from [place]  Mr Mccravy reports he has heard from one of his daughters in [place] He reports having "two daughters and grandkids in [place]" He reports he has visited them during the holidays in the past. He reports his daughter asked him about visiting for the new year holiday but he declined He reports discussing with her that he was "sick"  Covid Testing  [facility] RN CM inquired if Mr Verge had been tested and he has not  [facility] RN CM encouraged him to get tested  [facility] RN CM reviewed the importance of covid testing, how it is completed and that he would need to be driven to the [employer] testing site or another one for the nasal testing. Mr Pattillo voices not liking the idea of being tested nasally, but voices he is aware that it is important.  He discussed that he has always had his cough related to his asthma     Plans to follow up with Mr Renfrew with in the next 14-21 days   Pt encouraged to return a call to [facility] RN CM prn  Routed note to MDs/NP/PA  [facility] CM Care Plan Problem One     Most Recent Value  Care Plan Problem One  Impaired safety, fall risk with history of multiple falls affecting his other medical history of  hypertension, HIV, bladder cancer and asthma  Role Documenting the Problem One  Care Management Telephonic Coordinator  Care Plan for Problem One  Active  [facility] Long Term Goal   over the next 60 days the patient will have decreased or no falls as evidenced by patient report of decreased falls with use of DME, safety measures  [facility]N Long Term Goal Start Date  04/28/19  [facility] Long Term Goal Met Date  07/08/19  [facility] CM Short Term Goal #1   over the next 30 days patient will vebalize a better understanding of signs and symptoms of hypertension and hypotension  THN CM Short Term Goal #1 Start Date  07/08/19  [facility] CM Short Term Goal #1 Met Date  07/28/19  [facility] CM Short Term Goal #2   over the next 45 days patient will assist with identifying and use of at least two types of safety measures to prevent falls  THN CM Short Term Goal #2 Start Date  04/28/19  [facility] CM Short Term Goal #2 Met Date  07/08/19  [facility] CM Short Term Goal #3  over the next 21 days pt will receive a rolling walker and HH PT services to assist with mobility, strengthening and falls as evidence by EMR documentation and verbalization that DME and HHPT services are present by patient/aide  [facility] CM Short Term Goal #3 Start Date  07/22/19  Interventions for Short Tern Goal #3  assess home mobility still using cane   [facility] CM Short Term Goal #4  over the next21 days patient will be assisted with medicl alert services or the cost information so he can decide if he does not want medical alert as evidience by verbalization during follow up contact  [facility] CM Short Term Goal #4 Start Date  08/04/19  Interventions for Short Term Goal #4  discussed medical alert services and informed him there is generally a monthly charge answered questions     [facility] CM Care Plan Problem Two     Most Recent Value  Care Plan Problem Two  Social needs related to housing, transportation and possible personal care services  Role Documenting the Problem Two  Care Management Telephonic  Coordinator  Care Plan for Problem Two  Active  [facility]N Long Term Goal  over the next 45 days patient will assist with identifying and use of at least two available resources to meet the transportation, housing and food insecurity needs  [facility] Long Term Goal Start Date  04/28/19  [facility] Long Term Goal Met Date  05/17/19  [facility]N CM Short Term Goal #1   over the next 10 days patient will have transportation resources to assist with medical visits and community access as verbalized by the patient during follow up  [facility] CM Short Term Goal #1 Start Date  04/28/19  [facility] CM Short Term Goal #1 Met Date   05/03/19  [facility] CM Short Term Goal #2   over the next 14 days the patient will be able to verbalize at least 2 resources to assist with his food insecurity and housing needs in follow up calls  [facility] CM Short Term Goal #2 Start Date  04/28/19  [facility] CM Short Term Goal #2 Met Date  05/17/19  [facility]N CM Short Term Goal #3   over the next 21 days patient will attend medical appointments as noted by EMR   [facility] CM Short Term Goal #3 Start Date  07/08/19  [facility] CM Short Term Goal #3 Met Date  07/22/19    [facility] CM Care Plan Problem Three     Most Recent Value  Care Plan Problem Three  medication compliance  Role Documenting the Problem Three  Care Management Telephonic Coordinator  Care Plan for Problem Three  Active  THN Long Term Goal   over the next 75 days patient/pcs aide will report increased compliance with medication intake as orderd as evidence by verbaliztion during contacts and noted WNL lab values   [facility]N Long Term Goal Start Date  07/22/19  Interventions for Problem Three Long Term Goal  assessd compliance He is more compliant       Ayala Ribble L. [name], RN, BSN, [place] Coordinator Office number [phone] Mobile number [phone]  Main [facility] number [phone] Fax number [phone]

## 2019-08-10 ENCOUNTER — Ambulatory Visit: Payer: Self-pay | Admitting: *Deleted

## 2019-08-11 ENCOUNTER — Ambulatory Visit: Payer: Self-pay | Admitting: *Deleted

## 2019-08-15 ENCOUNTER — Ambulatory Visit: Payer: Self-pay | Admitting: *Deleted

## 2019-08-15 MED FILL — SYMTUZA 800-150-200-10 MG T: 800-150-200 | 30 days supply | Qty: 30 | Fill #2

## 2019-08-15 MED FILL — VENTOLIN HFA 90 MCG INHALER: 108 (90 BAS | 25 days supply | Qty: 18 | Fill #1

## 2019-08-17 ENCOUNTER — Ambulatory Visit: Payer: Medicare Other | Attending: Internal Medicine

## 2019-08-18 ENCOUNTER — Other Ambulatory Visit: Payer: Self-pay | Admitting: *Deleted

## 2019-08-18 NOTE — Patient Outreach (Addendum)
[name]) Care Management  08/18/2019  [name] Sep 28, 1955 [id number]   Complex care patientinitially referred via [place] referral Date:04/26/19 [facility] Source:EMMI Join Referral Reason:Pt engagementscore 13. He is in need of several resources such as transportation, food, and education pertaining to his wellbeing. He also states he has trouble with walking which has caused him to fall on multiple occasions. [place] access [place]: No admissions or ED visits in the last 12 months   Outreach successful after 3 calls  The first 2 calls went to voice message but [facility] RN CM could not leave a voice message as his mail box was full HIPAA verifiedby [name]   [name] reports he is doing good today He denies any medical concerns at this time  He reports an improved appetite and with medication intake   [name] reports he has heard from one of his daughters in [place] on 08/17/19    Covid Testing  [name] inquired if Mr [name] had been tested and he has not  [facility] RN CM encouraged him to get tested  He discussed that he has always had his cough related to his asthma  Televisit/Teleconference  [facility] RN CM discussed these options of contact with his primary care MD if possible in the future   Personal care services Mr Loll reports that his personal care services continue to be beneficial  Pest control- He is agreeing to assistance and a plan for pest control   Plansto follow up with [name] with in the next 14-21 days   Pt encouraged to return a call to [facility] RN CM prn  Routed note to MDs/NP/PA  [facility] CM Care Plan Problem One     Most Recent Value  Care Plan Problem One  Impaired safety, fall risk with history of multiple falls affecting his other medical history of hypertension, HIV, bladder cancer and asthma  Role Documenting the Problem One  Care Management Telephonic Coordinator   Care Plan for Problem One  Active  THN Long Term Goal   over the next 60 days the patient will have decreased or no falls as evidenced by patient report of decreased falls with use of DME, safety measures  [facility]N Long Term Goal Start Date  04/28/19  [facility]N Long Term Goal Met Date  07/08/19  [facility]N CM Short Term Goal #1   over the next 30 days patient will vebalize a better understanding of signs and symptoms of hypertension and hypotension  THN CM Short Term Goal #1 Start Date  07/08/19  [facility] CM Short Term Goal #1 Met Date  07/28/19  THN CM Short Term Goal #2   over the next 45 days patient will assist with identifying and use of at least two types of safety measures to prevent falls  [facility]N CM Short Term Goal #2 Start Date  04/28/19  [facility] CM Short Term Goal #2 Met Date  07/08/19  [facility]N CM Short Term Goal #3  over the next 21 days pt will receive a rolling walker and HH PT services to assist with mobility, strengthening and falls as evidence by EMR documentation and verbalization that DME and HHPT services are present by patient/aide  [name] CM Short Term Goal #3 Start Date  08/18/19  [name] CM Short Term Goal #4  over the next 21 days patient will be assisted with medicl alert services or the cost information so he can decide if he does not  want medical alert as evidience by verbalization during follow up contact  THN CM Short Term Goal #4 Start Date  08/04/19  Interventions for Short Term Goal #4  discussed medical alert services and informed him there is generally a monthly charge answered questions     [name] CM Care Plan Problem Two     Most Recent Value  Care Plan Problem Two  Social needs related to housing, transportation and possible personal care services  Role Documenting the Problem Two  Care Management [place] for Problem Two  Active  THN Long Term Goal  over the next 45 days patient will assist with identifying and use of at least two available resources to meet the  transportation, housing and food insecurity needs  [name] Long Term Goal Start Date  04/28/19  [name] Long Term Goal Met Date  05/17/19  [facility]N CM Short Term Goal #1   over the next 10 days patient will have transportation resources to assist with medical visits and community access as verbalized by the patient during follow up  [name] CM Short Term Goal #1 Start Date  04/28/19  [name] CM Short Term Goal #1 Met Date   05/03/19  THN CM Short Term Goal #2   over the next 14 days the patient will be able to verbalize at least 2 resources to assist with his food insecurity and housing needs in follow up calls  [name] CM Short Term Goal #2 Start Date  04/28/19  [name] CM Short Term Goal #2 Met Date  05/17/19  THN CM Short Term Goal #3   over the next 21 days patient will attend medical appointments as noted by EMR   [name] CM Short Term Goal #3 Start Date  07/08/19  [name] CM Short Term Goal #3 Met Date  07/22/19    [name] CM Care Plan Problem Three     Most Recent Value  Care Plan Problem Three  medication compliance  Role Documenting the Problem Three  Care Management Telephonic [place] for Problem Three  Active  THN Long Term Goal   over the next 75 days patient/pcs aide will report increased compliance with medication intake as orderd as evidence by verbaliztion during contacts and noted WNL lab values   THN Long Term Goal Start Date  07/22/19  Interventions for Problem Three Long Term Goal  assessed compliance      [name] [name], RN, BSN, [place] Coordinator Office number [phone] Mobile number [phone]  Main [facility] number [phone] Fax number [phone]

## 2019-08-29 ENCOUNTER — Ambulatory Visit: Payer: Self-pay | Admitting: *Deleted

## 2019-08-30 ENCOUNTER — Other Ambulatory Visit: Payer: Self-pay | Admitting: *Deleted

## 2019-08-30 ENCOUNTER — Other Ambulatory Visit: Payer: Self-pay
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
## 2019-08-31 ENCOUNTER — Telehealth: Payer: Self-pay | Admitting: *Deleted

## 2019-08-31 ENCOUNTER — Encounter: Payer: Self-pay | Admitting: *Deleted

## 2019-08-31 NOTE — Telephone Encounter (Signed)
PATIENT ON HOLD  Plan of Care orders will expire on 09/03/2019 but GOALS have not been completely meet at this time. I would like to connect with the patient and received further orders from MD. If I am unable to get in contact with him within the next 30 days after his orders expire, I will have to discharge at that time. RN WILL NOT RESUME CARE WITHOUT CURRENT ORDERS OBTAINED AND PATIENT ABLE/WILLING TO RE-ENGAGE IN CARE

## 2019-08-31 NOTE — Telephone Encounter (Signed)
RN contacted the patient today. Purpose of the call is to stay connected with the patient. RN unable to leave a message, voicemail is full. Alternate number called without an answer and voicemail has not been set up.

## 2019-09-02 ENCOUNTER — Other Ambulatory Visit: Payer: Self-pay

## 2019-09-02 NOTE — Patient Outreach (Signed)
[place] [facility]) Care Management  09/02/2019  [name] 08-16-1955 [id number]   Social Work referral received from [facility], Joellyn Quails, on 09/01/19.  Referral stated "Patient needs resources mailed to him or discussed with him about medical alert services" Successful outreach to patient today.  Patient did not recall discussing this with Maudie Mercury but did ask that list of systems be mailed to him.  Informed patient that typically the lowest cost systems are in the $20/month range.  Informed patient that he will need to contact companies to inquire about specifics such as installation etc.  Will follow up with patient within the next two weeks to ensure receipt of list being mailed.    [name], BSW Social Worker [phone]

## 2019-09-08 ENCOUNTER — Other Ambulatory Visit: Payer: Self-pay | Admitting: *Deleted

## 2019-09-08 NOTE — Patient Outreach (Signed)
[place] [facility]) Care Management  09/08/2019  Lao Varcoe [birth year] [id number]   [place]) Care Management Care coordination  [facility] RN CM left a voice message along with CM's contact info for [place] place office manager at [phone]  Plan: [place] CM will follow up with Mr Gagon within 7-14 days for further care coordination interventions and medica assessment   Taige Housman L. [name], RN, BSN, [place] Coordinator Office number [phone] Mobile number [phone]  Main THN number [phone] Fax number [phone]

## 2019-09-12 MED FILL — SYMTUZA 800-150-200-10 MG T: 800-150-200 | 30 days supply | Qty: 30 | Fill #3

## 2019-09-12 MED FILL — VENTOLIN HFA 90 MCG INHALER: 108 (90 BAS | 25 days supply | Qty: 18 | Fill #2

## 2019-09-13 ENCOUNTER — Other Ambulatory Visit: Payer: Self-pay

## 2019-09-13 ENCOUNTER — Other Ambulatory Visit: Payer: Self-pay | Admitting: *Deleted
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
## 2019-09-14 ENCOUNTER — Ambulatory Visit: Payer: Self-pay

## 2019-09-14 ENCOUNTER — Other Ambulatory Visit: Payer: Self-pay

## 2019-09-14 NOTE — Patient Outreach (Signed)
[place] [facility]) Care Management  09/14/2019  [name] [birth year] [id number]   [facility] Social Work discipline closure due to [employer] transition within agency.  Sent resources to [facility], Joellyn Quails, in case patient does not receive documentation mailed on 09/02/19.  [name], BSW Social Worker [phone]

## 2019-09-15 ENCOUNTER — Ambulatory Visit: Payer: Self-pay | Admitting: *Deleted

## 2019-09-15 VITALS — BP 140/58 | Temp 99.1°F

## 2019-09-15 DIAGNOSIS — B20 Human immunodeficiency virus [HIV] disease: Secondary | ICD-10-CM

## 2019-09-15 DIAGNOSIS — Z9114 Patient's other noncompliance with medication regimen: Secondary | ICD-10-CM

## 2019-09-16 ENCOUNTER — Other Ambulatory Visit: Payer: Self-pay

## 2019-09-16 ENCOUNTER — Telehealth: Payer: Self-pay | Admitting: *Deleted

## 2019-09-16 ENCOUNTER — Ambulatory Visit: Payer: Medicare Other

## 2019-09-16 NOTE — Telephone Encounter (Signed)
Current Orders Effective 09/15/2019  Individualized Plan Of Care, Certification Period of  to 09/15/19 to 12/14/2019 a. Type of service(s) and care to be delivered: [facility] Nurse   Frequency and duration of service: Effective: 09/15/19: 72mo3, 59mo1 3PRN's for complications with disease process/progression, medication changes or concerns b. Activity restrictions: Pt may be up as tolerated and can safely ambulate without the need for a assistive device. Pt wears glasses c. Safety Measures: Standard Precautions/Infection Control d. Service Objectives and Goals:Service Objectives are to assist the pt with HIV medication regimen adherence and staying in care with the Infectious Disease Clinic by identifying barriers to care. RN will address the barriers that are identified by the patient.   Patient continues to have unmet goals with adherence questionable. He continues to struggle with eating regularly and consistent adherence to medications. Patient centered goal is to stay in care, manage his HIV and have affordable housing. My goal is to bridge Mr Staver with [facility] Case Management and a possible discharge from my services.   e. Equipment required: No additional equipment needs at this time f. Functional Limitations: Vision. Pt has corrective glasses that he wears g. Rehabilitation potential: Guarded h. Diet and Nutritional Needs: Regular Diet i. Medications and treatments: Medications have been reconciled and reviewed and are a part of EPIC electronic file j. Specific therapies if needed: RN k. Pertinent diagnoses: Hypertension, Asthma, Bladder Cancer with Chemo treatment, Depression l. Expected outcome: Guarded

## 2019-09-16 NOTE — Progress Notes (Signed)
Spoke with Mr. Turlington today. He promised me that he will begin to take his medications and states he really wants to get his strength back. I told him if he is willing I would like to start back coming weekly to fill his pill box. He agreed and reassured me that he is serious now and will not miss anymore of his medications. I asked if he has taken his medications today? Jasdeep stated he has not because his medications have not arrived yet.. Mr. Conger also has a [facility] Case Manager/Kim that is also assisting him. Mr Schwein stated he will have his home exterminated and [facility]/Kim has offered to pay for a hotel while his home is being exterminated. Spoke with the apartment complex and his home will be exterminated on 09/20/19. In-home aide services has already started and Mr. Keast stated his personal care aide/Craig will be bringing him to his appt on the 17th with Dr. Megan Salon. Additional orders will be requested.

## 2019-09-19 ENCOUNTER — Other Ambulatory Visit: Payer: Self-pay | Admitting: *Deleted

## 2019-09-19 ENCOUNTER — Telehealth: Payer: Self-pay

## 2019-09-19 NOTE — Telephone Encounter (Signed)
COVID-19 Pre-Screening Questions:09/19/19  Do you currently have a fever (>100 F), chills or unexplained body aches? NO  Are you currently experiencing new cough, shortness of breath, sore throat, runny nose?NO .  Have you recently travelled outside the state of [place] in the last 14 days?NO .  Have you been in contact with someone that is currently pending confirmation of Covid19 testing or has been confirmed to have the Covid19 virus?  NO   **If the patient answers NO to ALL questions -  advise the patient to please call the clinic before coming to the office should any symptoms develop.     

## 2019-09-19 NOTE — Patient Outreach (Signed)
[facility]) Care Management  09/19/2019  Prem Jacobucci 1956/06/25 [id number]  [place] coordination- One time assistance  [facility] RN CM collaborated with [facility] sales representative when she returned a call to [place], [name], Marcie Bal and [facility] giving committee team related to a safe plan for pest control for Mr Kult who has a Medical history of asthma with bugs in his apartment [name] was provided the details for the one time assist to have pt removed from the apartment She confirms she will supervise the pest control of Mr Wissler apartment on Wednesday, September 21 2019  She and CM discussed him returning to the apartment on Friday September 23 2019  [facility] RN Cm discussed the cost details with [facility] SW and giving committee   [facility] RN CM will follow up with Mr Ahlers within 7-14 days for further care coordination interventions and medica assessment  Llewyn Heap L. [name], RN, BSN, [place] Coordinator Office number [phone] Mobile number [phone]  Main [facility] number [phone] Fax number [phone]

## 2019-09-19 NOTE — Telephone Encounter (Signed)
I approve this plan of care.

## 2019-09-20 ENCOUNTER — Ambulatory Visit: Payer: Medicare Other | Admitting: Internal Medicine

## 2019-09-21 ENCOUNTER — Encounter: Payer: Self-pay | Admitting: *Deleted

## 2019-09-21 ENCOUNTER — Other Ambulatory Visit: Payer: Self-pay | Admitting: *Deleted
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
## 2019-09-27 ENCOUNTER — Ambulatory Visit: Payer: Self-pay | Admitting: *Deleted

## 2019-09-28 ENCOUNTER — Other Ambulatory Visit: Payer: Self-pay | Admitting: *Deleted

## 2019-09-28 NOTE — Patient Outreach (Signed)
  [facility]) Care Management  09/28/2019  Sender Starr 24-Oct-1955 [id number]   [facility] outreach to complex patient referred via [place] attempt unsuccessful No answer. Phone mail box full [facility] RN CM was not able to leave a HIPAA ([place]) compliant voicemail message along with CM's contact info.   Plan: [facility] RN CM scheduled this patient for another call attempt within 7-10 business days   Rocket Gunderson L. [name], RN, BSN, [place] Coordinator Office number [phone] Mobile number [phone]  Main [facility] number [phone] Fax number [phone]

## 2019-09-30 ENCOUNTER — Encounter: Payer: Self-pay | Admitting: *Deleted

## 2019-09-30 ENCOUNTER — Other Ambulatory Visit: Payer: Self-pay

## 2019-09-30 ENCOUNTER — Other Ambulatory Visit: Payer: Self-pay | Admitting: *Deleted
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
## 2019-10-11 MED FILL — ALBUTEROL SULFATE HFA 108 (: 108 (90 BAS | 25 days supply | Qty: 18 | Fill #3

## 2019-10-11 MED FILL — SYMTUZA 800-150-200-10 MG T: 800-150-200 | 30 days supply | Qty: 30 | Fill #4

## 2019-10-12 ENCOUNTER — Other Ambulatory Visit: Payer: Self-pay | Admitting: *Deleted

## 2019-10-12 ENCOUNTER — Other Ambulatory Visit: Payer: Self-pay
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
## 2019-10-27 ENCOUNTER — Other Ambulatory Visit: Payer: Self-pay

## 2019-10-27 ENCOUNTER — Other Ambulatory Visit: Payer: Self-pay | Admitting: *Deleted
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
## 2019-11-01 DIAGNOSIS — N3000 Acute cystitis without hematuria: Secondary | ICD-10-CM | POA: Diagnosis not present

## 2019-11-01 DIAGNOSIS — C678 Malignant neoplasm of overlapping sites of bladder: Secondary | ICD-10-CM | POA: Diagnosis not present

## 2019-11-02 ENCOUNTER — Telehealth: Payer: Self-pay | Admitting: *Deleted

## 2019-11-03 ENCOUNTER — Telehealth: Payer: Self-pay | Admitting: *Deleted

## 2019-11-03 NOTE — Telephone Encounter (Signed)
[place] to make them aware that [name] is still interested in the apartment. Confirmed contact information and he is still on their waiting list.

## 2019-11-04 MED FILL — ALBUTEROL SULFATE HFA 108 (: 108 (90 BAS | 25 days supply | Qty: 18 | Fill #4

## 2019-11-04 MED FILL — SYMTUZA 800-150-200-10 MG T: 800-150-200 | 30 days supply | Qty: 30 | Fill #5

## 2019-11-10 ENCOUNTER — Other Ambulatory Visit: Payer: Self-pay | Admitting: *Deleted

## 2019-11-10 NOTE — Patient Outreach (Addendum)
[facility]) Care Management  11/10/2019  Virginia Francisco [birth year] [id number]   [facility] outreach to complex patient referred via [place] referral Date:04/26/19  No [place] admissions nor ED visits in the last 6 months  Outreach Successful to his mobile number [phone] He reports his home number is "broken" Patient is able to verify HIPAA ([place]) identifiers, date of birth (DOB) and address     Follow up assessment Urology appointment He did attend the 11/01/19 urology appointment He reports an infection was noted and they "want to go inside me again and clean me out" He feels that this is why he has been "weak". Mr Sebesta discussed need for routine urology follow up. [facility] RN CM encouraged him to continue to request follow up appointments now that he has reconnected with Dr Louis Meckel.  He discussed how he generally feels after urology treatments (painful and sore afterwards) he reports previous urology procedure x "six". He voiced concerns with a family history of cancer. He discusses wanting to be around to visit with his children and has a new one month old grandson. [name] allowed him time to ventilate his feelings and provided encouragement to continue visits and treatments  Ambulation He reports still only needing to use his cane but still only being able to walk sort distances  medications compliance reports missed some HIV meds related to GI symptoms (nausea "sp sick in my stomach) He reports not eating and taking prn medicine for nausea He was encouraged to report these types of worsening symptoms to MDs   Today he reports no GI symptoms and reports he is cooking black eye peas ad ham hocks.   Phone access He reports his phone for the number [phone] is broken and he needs all his providers to know his new number [phone]  [place] CM updated Epic to reflect the primary number is [phone]. He request [facility] RN  CM to provide his new number to all medical and transportation contacts  Housing Mr Holsomback confirms he recently had assistance to renew his housing application. He was made aware that the application needs to be completed annually. Marland Kitchen He voices he is aware that the low income "[name]" does not have available apartments at this time   Plan [facility] RN CM will follow up with Mr Shelnutt within the next 14-21 business days to assess if he has scheduled he transportation for 12/02/19 urology appointment and further disease management education (cystogram)  [facility] CM Care Plan Problem One     Most Recent Value  Care Plan Problem One  Impaired safety, fall risk with history of multiple falls affecting his other medical history of hypertension, HIV, bladder cancer and asthma  Role Documenting the Problem One  Care Management Telephonic Coordinator  Care Plan for Problem One  Active  THN Long Term Goal   over the next 60 days the patient will have decreased or no falls as evidenced by patient report of decreased falls with use of DME, safety measures  THN Long Term Goal Start Date  04/28/19  [facility] Long Term Goal Met Date  07/08/19  THN CM Short Term Goal #1   over the next 30 days patient will vebalize a better understanding of signs and symptoms of hypertension and hypotension  THN CM Short Term Goal #1 Start Date  07/08/19  [facility] CM Short Term Goal #1 Met Date  07/28/19  THN CM Short Term Goal #2   over the next 45 days patient will assist with identifying and use of at least two types of safety measures to prevent falls  THN CM Short Term Goal #2 Start Date  04/28/19  [facility] CM Short Term Goal #2 Met Date  07/08/19  THN CM Short Term Goal #3  over the next 21 days pt will receive a rolling walker and HH PT services to assist with mobility, strengthening and falls as evidence by EMR documentation and verbalization that DME and HHPT services are present by patient/aide  [facility] CM Short Term Goal #3 Start Date  08/18/19   [facility] CM Short Term Goal #3 Met Date  09/22/19  THN CM Short Term Goal #4  over the next 21 days patient will be assisted with medicl alert services or the cost information so he can decide if he does not want medical alert as evidience by verbalization during follow up contact  THN CM Short Term Goal #4 Start Date  08/31/19  [facility] CM Short Term Goal #4 Met Date  09/13/19  Interventions for Short Term Goal #4  encouraged pt to check his mail and read resources [facility] SW sent him    [facility] CM Care Plan Problem Two     Most Recent Value  Care Plan Problem Two  Social needs related to housing, transportation and possible personal care services  Role Documenting the Problem Two  Care Management Telephonic Coordinator  Care Plan for Problem Two  Active  THN Long Term Goal  over the next 45 days patient will assist with identifying and use of at least two available resources to meet the transportation, housing and food insecurity needs  [facility] Long Term Goal Start Date  04/28/19  [facility] Long Term Goal Met Date  05/17/19  THN CM Short Term Goal #1   over the next 10 days patient will have transportation resources to assist with medical visits and community access as verbalized by the patient during follow up  [facility] CM Short Term Goal #1 Start Date  04/28/19  [facility] CM Short Term Goal #1 Met Date   05/03/19  THN CM Short Term Goal #2   over the next 14 days the patient will be able to verbalize at least 2 resources to assist with his food insecurity and housing needs in follow up calls  [facility] CM Short Term Goal #2 Start Date  04/28/19  [facility] CM Short Term Goal #2 Met Date  05/17/19  THN CM Short Term Goal #3   over the next 45 days patient will attend medical appointments as noted by EMR   [facility] CM Short Term Goal #3 Start Date  10/12/19  Interventions for Short Term Goal #3  assess attendance to appointments, reviewed upcoing appointments discussed transportation   [facility] CM Short Term Goal #4  over the next 31 days patient  will receive assistance with pest control to assist with better health maintenance  THN CM Short Term Goal #4 Start Date  08/31/19  [facility] CM Short Term Goal #4 Met Date  09/22/19    [facility] CM Care Plan Problem Three     Most Recent Value  Care Plan Problem Three  medication compliance  Role Documenting the Problem Three  Care Management Telephonic Coordinator  Care Plan for Problem Three  Active  THN Long Term Goal   over the next 75 days patient/pcs aide will report increased compliance with medication intake as orderd  as evidence by verbaliztion during contacts and noted WNL lab values   THN Long Term Goal Start Date  07/22/19  Interventions for Problem Three Long Term Goal  assessd for compliance with medications offered encouragement, discussed reporting worsening symptoms to MD      Joelene Millin L. [name], RN, BSN, [place] Coordinator Office number [phone] Mobile number [phone]  Main THN number [phone] Fax number [phone]

## 2019-11-11 ENCOUNTER — Other Ambulatory Visit: Payer: Self-pay
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
## 2019-11-24 ENCOUNTER — Other Ambulatory Visit: Payer: Self-pay

## 2019-11-24 ENCOUNTER — Other Ambulatory Visit: Payer: Self-pay | Admitting: *Deleted
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
## 2019-11-25 ENCOUNTER — Other Ambulatory Visit: Payer: Self-pay | Admitting: *Deleted

## 2019-11-25 NOTE — Patient Outreach (Signed)
[name]) Care Management  11/25/2019  [name] 01-26-1956 [id number]   [place] coordination-Collaboration with [place] DSS and [facility] with patient  [place] with [name] at [place] at [phone]  She confirms [name] is listed as an active case for food stamps and  Confirmed his food stamps were not "cut off " [facility] RN CM Completed a conference call with [name] and [name] (DSS) So that she could review with him that he has $488 on his card and generally gets $135 monthly and at times emergency amounts of $99 [name] reports he checked his card last week and was not able to find available funds  [name] was able to review the records to confirm the card had not been used since March 2021. [name] was informed and voiced understanding [name] and [name] agreed to have him sent a new food stamp card. It will take 7-10 business days for him to receive it (by Dec 07 2019) [name] also discussed with him that he is not up for recertification until July 2021 and will receive paperwork in the mail in June 2021. His address and new number were verified and updated in the DSS data base He confirms he has food in his apartment to last until the food stamp card arrives  Transportation to Urology appointment [facility] RN CM and [name] called to [facility] to get assistance with scheduling his transportation for his Friday December 02 2019 1045 am appointment with [name]   Eyesight/glasses [name] was encouraged again to go to his local [employer] for his eye exam and glasses as he was noted to state difficulty with seeing to write down the DSS contact number  Plan [facility] RN CM will follow up with [name] within the next 7-14 business days Pt encouraged to return a call to [facility] RN CM prn  [name] CM Care Plan Problem One     Most Recent Value  Care Plan Problem One  Impaired safety, fall risk with history of multiple falls affecting his other medical history of hypertension,  HIV, bladder cancer and asthma  (Pended)   Role Documenting the Problem One  Care Management Telephonic Coordinator  (Pended)   [place] for Problem One  Active  (Pended)   THN Long Term Goal   over the next 60 days the patient will have decreased or no falls as evidenced by patient report of decreased falls with use of DME, safety measures  (Pended)   THN Long Term Goal Start Date  04/28/19  (Pended)   THN Long Term Goal Met Date  07/08/19  (Pended)   THN CM Short Term Goal #1   over the next 30 days patient will vebalize a better understanding of signs and symptoms of hypertension and hypotension  (Pended)   THN CM Short Term Goal #1 Start Date  07/08/19  (Pended)   THN CM Short Term Goal #1 Met Date  07/28/19  (Pended)   THN CM Short Term Goal #2   over the next 45 days patient will assist with identifying and use of at least two types of safety measures to prevent falls  (Pended)   THN CM Short Term Goal #2 Start Date  04/28/19  (Pended)   THN CM Short Term Goal #2 Met Date  07/08/19  (Pended)   THN CM Short Term Goal #3  over the next 21 days pt will receive a rolling walker and HH  PT services to assist with mobility, strengthening and falls as evidence by EMR documentation and verbalization that DME and HHPT services are present by patient/aide  (Pended)   THN CM Short Term Goal #3 Start Date  08/18/19  (Pended)   THN CM Short Term Goal #3 Met Date  09/22/19  (Pended)   THN CM Short Term Goal #4  over the next 21 days patient will be assisted with medicl alert services or the cost information so he can decide if he does not want medical alert as evidience by verbalization during follow up contact  (Pended)   THN CM Short Term Goal #4 Start Date  08/31/19  (Pended)   THN CM Short Term Goal #4 Met Date  09/13/19  (Pended)   Interventions for Short Term Goal #4  encouraged pt to check his mail and read resources [name] SW sent him  (Pended)     [name] CM Care Plan Problem Two     Most Recent Value   Care Plan Problem Two  Social needs related to housing, transportation and possible personal care services  (Pended)   Role Documenting the Problem Two  Care Management Telephonic Coordinator  (Pended)   [place] for Problem Two  Active  (Pended)   THN Long Term Goal  over the next 45 days patient will assist with identifying and use of at least two available resources to meet the transportation, housing and food insecurity needs  (Pended)   [place] Term Goal Start Date  04/28/19  (Pended)   THN Long Term Goal Met Date  05/17/19  (Pended)   THN CM Short Term Goal #1   over the next 10 days patient will have transportation resources to assist with medical visits and community access as verbalized by the patient during follow up  (Pended)   THN CM Short Term Goal #1 Start Date  04/28/19  (Pended)   THN CM Short Term Goal #1 Met Date   05/03/19  (Pended)   THN CM Short Term Goal #2   over the next 14 days the patient will be able to verbalize at least 2 resources to assist with his food insecurity and housing needs in follow up calls  (Pended)   THN CM Short Term Goal #2 Start Date  04/28/19  (Pended)   THN CM Short Term Goal #2 Met Date  05/17/19  (Pended)   THN CM Short Term Goal #3   over the next 45 days patient will attend medical appointments as noted by EMR   (Pended)   THN CM Short Term Goal #3 Start Date  10/12/19  (Pended)   THN CM Short Term Goal #4  over the next 31 days patient will receive assistance with pest control to assist with better health maintenance  (Pended)   THN CM Short Term Goal #4 Start Date  08/31/19  (Pended)   THN CM Short Term Goal #4 Met Date  09/22/19  (Pended)     [facility] CM Care Plan Problem Three     Most Recent Value  Care Plan Problem Three  medication compliance  (Pended)   Role Documenting the Problem Three  Care Management Telephonic Coordinator  (Pended)   [place] for Problem Three  Active  (Pended)   THN Long Term Goal   over the next 75 days  patient/pcs aide will report increased compliance with medication intake as orderd as evidence by verbaliztion during contacts and noted WNL lab values   (  Pended)   THN Long Term Goal Start Date  07/22/19  (Pended)        Kadance Mccuistion L. [name], RN, BSN, [place] Coordinator Office number [phone] Mobile number [phone]  Main THN number [phone] Fax number [phone]

## 2019-11-29 ENCOUNTER — Ambulatory Visit: Payer: Medicare Other | Admitting: Internal Medicine

## 2019-12-05 ENCOUNTER — Other Ambulatory Visit: Payer: Self-pay | Admitting: *Deleted

## 2019-12-05 ENCOUNTER — Other Ambulatory Visit: Payer: Self-pay
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
## 2019-12-13 ENCOUNTER — Telehealth: Payer: Self-pay | Admitting: *Deleted

## 2019-12-13 NOTE — Telephone Encounter (Signed)
Current Orders Effective 09/15/2019  Individualized Plan Of Care, Certification Period of  to 12/15/19 to 03/14/2020 a. Type of service(s) and care to be delivered: [facility] Nurse   Frequency and duration of service: Effective: 12/15/19: 23mo3, 3PRN's for complications with disease process/progression, medication changes or concerns b. Activity restrictions: Pt may be up as tolerated and can safely ambulate without the need for a assistive device. Pt wears glasses c. Safety Measures: Standard Precautions/Infection Control d. Service Objectives and Goals:Service Objectives are to assist the pt with HIV medication regimen adherence and staying in care with the Infectious Disease Clinic by identifying barriers to care. RN will address the barriers that are identified by the patient.   Patient continues to have unmet goals with adherence questionable. He continues to struggle with eating regularly and consistent adherence to medications. Patient centered goal is to stay in care, manage his HIV and have affordable housing. My goal is to bridge Mr Podolski with [facility]P Case Management and a possible discharge from my services.   e. Equipment required: No additional equipment needs at this time f. Functional Limitations: Vision. Pt has corrective glasses that he wears g. Rehabilitation potential: Guarded h. Diet and Nutritional Needs: Regular Diet i. Medications and treatments: Medications have been reconciled and reviewed and are a part of EPIC electronic file j. Specific therapies if needed: RN k. Pertinent diagnoses: Hypertension, Asthma, Bladder Cancer with Chemo treatment, Depression l. Expected outcome: Guarded

## 2019-12-13 NOTE — Telephone Encounter (Signed)
I approve of this plan of care. 

## 2020-01-03 ENCOUNTER — Ambulatory Visit: Payer: Medicare Other | Admitting: Internal Medicine

## 2020-01-05 ENCOUNTER — Other Ambulatory Visit: Payer: Self-pay

## 2020-01-05 ENCOUNTER — Other Ambulatory Visit: Payer: Self-pay | Admitting: *Deleted
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
## 2020-01-10 ENCOUNTER — Ambulatory Visit (INDEPENDENT_AMBULATORY_CARE_PROVIDER_SITE_OTHER): Payer: Medicare Other | Admitting: Internal Medicine

## 2020-01-10 ENCOUNTER — Other Ambulatory Visit: Payer: Self-pay

## 2020-01-10 ENCOUNTER — Encounter: Payer: Self-pay | Admitting: Internal Medicine

## 2020-01-10 VITALS — BP 178/69 | HR 76 | Temp 97.6°F | Ht 64.0 in | Wt 122.0 lb

## 2020-01-10 DIAGNOSIS — B20 Human immunodeficiency virus [HIV] disease: Secondary | ICD-10-CM | POA: Diagnosis not present

## 2020-01-10 DIAGNOSIS — R634 Abnormal weight loss: Secondary | ICD-10-CM

## 2020-01-10 DIAGNOSIS — F325 Major depressive disorder, single episode, in full remission: Secondary | ICD-10-CM | POA: Diagnosis not present

## 2020-01-10 DIAGNOSIS — Z113 Encounter for screening for infections with a predominantly sexual mode of transmission: Secondary | ICD-10-CM

## 2020-01-10 DIAGNOSIS — C679 Malignant neoplasm of bladder, unspecified: Secondary | ICD-10-CM

## 2020-01-11 ENCOUNTER — Telehealth: Payer: Self-pay | Admitting: *Deleted

## 2020-01-11 NOTE — Assessment & Plan Note (Signed)
I am not sure what the status is of his bladder cancer.  He is not sure if he has a follow-up visit with his urologist.

## 2020-01-11 NOTE — Assessment & Plan Note (Signed)
His adherence has suffered lately and his viral load is nearly 100,000 when last checked.  He will get repeat blood work today and we have verified that his Jules Husbands will ship overnight to him.  I also gave him the number for the [place] vaccination site and encouraged him to go there as soon as possible.  He will follow-up here in 6 weeks.

## 2020-01-11 NOTE — Progress Notes (Signed)
Patient Active Problem List   Diagnosis Date Noted  . HIV disease ([place]) 04/18/2015    Priority: High  . Dysuria 05/29/2015    Priority: Medium  . Depression 04/19/2015    Priority: Medium  . 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 Malignant neoplasm of bladder ([place]) 03/30/2015    Priority: Medium  . Frequent falls 07/21/2019  . Abdominal pain 02/19/2017  . Exacerbation of asthma 09/09/2016  . Acute respiratory failure with hypoxia ([place]) 09/09/2016  . Unintentional weight loss 07/01/2016  . Anorexia 03/11/2016  . Syncope 10/31/2015  . HTN (hypertension) 03/30/2015  . Asthma, chronic 03/30/2015    Patient's Medications  New Prescriptions   No medications on file  Previous Medications   AMLODIPINE (NORVASC) 5 MG TABLET    TAKE 1 TABLET (5 MG TOTAL) BY MOUTH DAILY.   CEPHALEXIN (KEFLEX) 500 MG CAPSULE    TAKE 1 CAPSULE BY MOUTH THREE TIMES A DAY   CETIRIZINE (ZYRTEC) 10 MG TABLET    Take 1 tablet (10 mg total) by mouth at bedtime.   DARUNAVIR-COBICISCTAT-EMTRICITABINE-TENOFOVIR ALAFENAMIDE (SYMTUZA) 800-150-200-10 MG TABS    Take 1 tablet by mouth daily with breakfast.   ENSURE (ENSURE)    Take 237 mLs by mouth 2 (two) times daily between meals. Decreased Nutritional intake due to Bladder Cancer treatment and uncontrolled HIV   IPRATROPIUM-ALBUTEROL (DUONEB) 0.5-2.5 (3) MG/3ML SOLN    Take 3 mLs by nebulization every 6 (six) hours as needed (wheezing and chest tightness).   MEGESTROL (MEGACE) 400 MG/10ML SUSPENSION    Take 10 mLs (400 mg total) by mouth daily.   ONDANSETRON (ZOFRAN) 4 MG TABLET    Take 1 tablet (4 mg total) by mouth every 8 (eight) hours as needed for nausea or vomiting.   SULFAMETHOXAZOLE-TRIMETHOPRIM (BACTRIM DS) 800-160 MG TABLET    Take 1 tablet by mouth 2 (two) times daily.   VENTOLIN HFA 108 (90 BASE) MCG/ACT INHALER    INHALE 2 PUFFS BY MOUTH INTO THE LUNGS EVERY 6 HOURS AS NEEDED FOR WHEEZING OR SHORTNESS OF BREATH     VITAMIN D, ERGOCALCIFEROL, (DRISDOL) 1.25 MG (50000 UT) CAPS CAPSULE    Take 1 capsule (50,000 Units total) by mouth every 7 (seven) days.  Modified Medications   No medications on file  Discontinued Medications   No medications on file    Subjective: Susana is in for his routine HIV follow-up visit.  He has been out of his [place] recently.  He is not exactly sure when he ran out or why he ran out.  Records indicate that he was having his medication shipped from [place].  He was receiving his supplies on a monthly basis up through April.  However he would not answer phone calls and his voice mailbox was full when the pharmacy started calling in late April for his May refill.  They called steadily for 1 month before giving up.  He says that he is still at his same apartment but has a new phone.  He denies feeling depressed and his appetite has improved.  He has occasional bouts of hematuria and dysuria but it is rare and inconsistent.  He has not had any falls recently.  Review of Systems: Review of Systems  Constitutional: Negative for chills, diaphoresis, fever, malaise/fatigue and weight loss.  HENT: Negative for sore throat.   Respiratory: Negative for cough, sputum production and shortness of  breath.   Cardiovascular: Negative for chest pain.  Gastrointestinal: Negative for abdominal pain, diarrhea, heartburn, nausea and vomiting.  Genitourinary: Positive for dysuria and hematuria. Negative for frequency.  Musculoskeletal: Negative for joint pain and myalgias.  Skin: Negative for rash.  Neurological: Negative for dizziness and headaches.  Psychiatric/Behavioral: Negative for depression and substance abuse. The patient is not nervous/anxious.     Past Medical History:  Diagnosis Date  . Anorexia   . Asthma with COPD ([place])   . Bladder cancer (H[facility])    and prostatic urethra cancer  HX TURBT'S IN [place], [place]  WITH INSTILLATION CHEMO TX'S  . Bladder tumor    . History of transfusion   . HIV disease ([place])    under care of [facility]  . Hypertension   . Nocturia   . Vitamin D deficiency     Social History   Tobacco Use  . Smoking status: Current Every Day Smoker    Packs/day: 0.10    Years: 17.00    Pack years: 1.70    Types: Cigarettes  . Smokeless tobacco: Never Used  . Tobacco comment: 3/day;  Substance Use Topics  . Alcohol use: Yes    Alcohol/week: 6.0 standard drinks    Types: 6 Cans of beer per week    Comment: occasional  . Drug use: Yes    Types: Marijuana    Comment: occassional    Family History  Problem Relation Age of Onset  . Cancer Mother        Gastric Cancer  . Hypertension Mother   . Heart disease Mother   . Cancer Maternal Grandmother        breast cancer     Allergies  Allergen Reactions  . Aspirin Shortness Of Breath, Nausea And Vomiting and Swelling    Health Maintenance  Topic Date Due  . COVID-19 Vaccine (1) Never done  . COLONOSCOPY  Never done  . PNA vac Low Risk Adult (1 of 2 - PCV13) Never done  . TETANUS/TDAP  04/02/2027 (Originally 10/04/1974)  . INFLUENZA VACCINE  03/04/2020  . Hepatitis C Screening  Completed    Objective:  Vitals:   01/10/20 1420  BP: (!) 178/69  Pulse: 76  Temp: 97.6 F (36.4 C)  SpO2: 99%  Weight: 122 lb (55.3 kg)  Height: 5\' 4"  (1.626 m)   Body mass index is 20.94 kg/m.  Physical Exam Constitutional:      Comments: He has gained 15 pounds since his last visit.  Cardiovascular:     Rate and Rhythm: Normal rate.     Heart sounds: No murmur.  Pulmonary:     Effort: Pulmonary effort is normal.     Breath sounds: Normal breath sounds.  Abdominal:     General: Abdomen is flat.     Palpations: Abdomen is soft.     Tenderness: There is no abdominal tenderness.  Musculoskeletal:        General: No swelling or tenderness.  Skin:    Findings: No rash.  Neurological:     General: No focal deficit present.   Psychiatric:        Mood and Affect: Mood normal.     Lab Results Lab Results  Component Value Date   WBC 3.6 05/11/2019   HGB 12.4 (L) 05/11/2019   HCT 35.8 (L) 05/11/2019   MCV 99 (H) 05/11/2019   PLT 131 (L) 05/11/2019    Lab Results  Component Value Date   CREATININE  1.00 05/11/2019   BUN 7 (L) 05/11/2019   NA 123 (L) 05/11/2019   K 4.7 05/11/2019   CL 93 (L) 05/11/2019   CO2 17 (L) 05/11/2019    Lab Results  Component Value Date   ALT 20 05/11/2019   AST 69 (H) 05/11/2019   ALKPHOS 157 (H) 05/11/2019   BILITOT 0.8 05/11/2019    Lab Results  Component Value Date   CHOL 99 (L) 05/11/2019   HDL 71 05/11/2019   LDLCALC 17 05/11/2019   TRIG 40 05/11/2019   CHOLHDL 1.4 05/11/2019   Lab Results  Component Value Date   LABRPR NON-REACTIVE 05/17/2019   HIV 1 RNA Quant (copies/mL)  Date Value  07/21/2019 99,100 (H)  05/17/2019 6,150 (H)  07/13/2018 960 (H)   CD4 T Cell Abs (/uL)  Date Value  07/21/2019 655  05/17/2019 771  06/28/2018 630     Problem List Items Addressed This Visit      High   HIV disease ([place])    His adherence has suffered lately and his viral load is nearly 100,000 when last checked.  He will get repeat blood work today and we have verified that his [facility] will ship overnight to him.  I also gave him the number for the [place] vaccination site and encouraged him to go there as soon as possible.  He will follow-up here in 6 weeks.      Relevant Orders   T-helper cell (CD4)- ([name] clinic only)   HIV-1 RNA quant-no reflex-bld   CBC   Comprehensive metabolic panel   RPR     Medium   Malignant neoplasm of bladder ([place])    I am not sure what the status is of his bladder cancer.  He is not sure if he has a follow-up visit with his urologist.      Depression    His depression is in remission.        Unprioritized   Unintentional weight loss    His weight loss has reversed, possibly as a result of his depression  going into remission.           Michel Bickers, MD [facility] [phone] pager   [phone] cell 01/11/2020, 8:59 AM

## 2020-01-11 NOTE — Assessment & Plan Note (Signed)
His weight loss has reversed, possibly as a result of his depression going into remission.

## 2020-01-11 NOTE — Assessment & Plan Note (Signed)
His depression is in remission. 

## 2020-01-11 NOTE — Telephone Encounter (Addendum)
Reviewed office visit with Dr. Megan Salon. [facility] pharmacy to see if medication has been sent.   [facility]([place] stated Symtuza should be delivered today.   Called [name] and we discussed his upcoming appts, transportation needs and concerns. [name] stated he has already received his medication(Symtuza) around 1pm today.  At this time we made several conference calls to address his needs: 1. [facility] appt scheduled for 01/26/20 at 2:30pm 2. [name] appt scheduled for 01/17/20 at 2:30 3. Housing appt with [facility] for 01/12/20 at 1pm, [facility] transportation arranged and I will meet the patient there to renew his application 4. Emailed a copy of [name] benefit verification letter and ID to [place] and Donna/Housing coordinator with [facility]

## 2020-01-12 ENCOUNTER — Other Ambulatory Visit: Payer: Medicare Other | Admitting: *Deleted

## 2020-01-17 ENCOUNTER — Other Ambulatory Visit: Payer: Medicare Other

## 2020-01-18 NOTE — Addendum Note (Signed)
Addended by: Landis Gandy on: 01/18/2020 11:32 AM   Modules accepted: Orders

## 2020-01-19 ENCOUNTER — Other Ambulatory Visit: Payer: Medicare Other

## 2020-01-19 ENCOUNTER — Other Ambulatory Visit: Payer: Self-pay

## 2020-01-19 DIAGNOSIS — B20 Human immunodeficiency virus [HIV] disease: Secondary | ICD-10-CM

## 2020-01-19 DIAGNOSIS — Z113 Encounter for screening for infections with a predominantly sexual mode of transmission: Secondary | ICD-10-CM

## 2020-01-20 LAB — T-HELPER CELL (CD4) - (RCID CLINIC ONLY)
CD4 % Helper T Cell: 42 % (ref 33–65)
CD4 T Cell Abs: 458 /uL (ref 400–1790)

## 2020-01-22 LAB — CBC WITH DIFFERENTIAL/PLATELET
Absolute Monocytes: 411 cells/uL (ref 200–950)
Basophils Absolute: 30 cells/uL (ref 0–200)
Basophils Relative: 1 %
Eosinophils Absolute: 129 cells/uL (ref 15–500)
Eosinophils Relative: 4.3 %
HCT: 36.7 % — ABNORMAL LOW (ref 38.5–50.0)
Hemoglobin: 12.6 g/dL — ABNORMAL LOW (ref 13.2–17.1)
Lymphs Abs: 1404 cells/uL (ref 850–3900)
MCH: 34.6 pg — ABNORMAL HIGH (ref 27.0–33.0)
MCHC: 34.3 g/dL (ref 32.0–36.0)
MCV: 100.8 fL — ABNORMAL HIGH (ref 80.0–100.0)
MPV: 9.4 fL (ref 7.5–12.5)
Monocytes Relative: 13.7 %
Neutro Abs: 1026 cells/uL — ABNORMAL LOW (ref 1500–7800)
Neutrophils Relative %: 34.2 %
Platelets: 191 10*3/uL (ref 140–400)
RBC: 3.64 10*6/uL — ABNORMAL LOW (ref 4.20–5.80)
RDW: 12.6 % (ref 11.0–15.0)
Total Lymphocyte: 46.8 %
WBC: 3 10*3/uL — ABNORMAL LOW (ref 3.8–10.8)

## 2020-01-22 LAB — COMPLETE METABOLIC PANEL WITH GFR
AG Ratio: 0.6 (calc) — ABNORMAL LOW (ref 1.0–2.5)
ALT: 26 U/L (ref 9–46)
AST: 71 U/L — ABNORMAL HIGH (ref 10–35)
Albumin: 3.5 g/dL — ABNORMAL LOW (ref 3.6–5.1)
Alkaline phosphatase (APISO): 113 U/L (ref 35–144)
BUN: 11 mg/dL (ref 7–25)
CO2: 20 mmol/L (ref 20–32)
Calcium: 8.9 mg/dL (ref 8.6–10.3)
Chloride: 93 mmol/L — ABNORMAL LOW (ref 98–110)
Creat: 0.82 mg/dL (ref 0.70–1.25)
GFR, Est African American: 107 mL/min/{1.73_m2} (ref >=60)
GFR, Est Non African American: 92 mL/min/{1.73_m2} (ref >=60)
Globulin: 5.7 g/dL (calc) — ABNORMAL HIGH (ref 1.9–3.7)
Glucose, Bld: 86 mg/dL (ref 65–99)
Potassium: 4.6 mmol/L (ref 3.5–5.3)
Sodium: 121 mmol/L — ABNORMAL LOW (ref 135–146)
Total Bilirubin: 0.4 mg/dL (ref 0.2–1.2)
Total Protein: 9.2 g/dL — ABNORMAL HIGH (ref 6.1–8.1)

## 2020-01-22 LAB — RPR: RPR Ser Ql: NONREACTIVE

## 2020-01-22 LAB — HIV-1 RNA QUANT-NO REFLEX-BLD
HIV 1 RNA Quant: 5980 copies/mL — ABNORMAL HIGH
HIV-1 RNA Quant, Log: 3.78 Log copies/mL — ABNORMAL HIGH

## 2020-02-02 ENCOUNTER — Encounter: Payer: Self-pay | Admitting: *Deleted

## 2020-02-02 ENCOUNTER — Other Ambulatory Visit: Payer: Self-pay

## 2020-02-02 ENCOUNTER — Other Ambulatory Visit: Payer: Self-pay | Admitting: *Deleted
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
## 2020-02-06 MED FILL — SYMTUZA 800-150-200-10 MG T: 800-150-200 | 30 days supply | Qty: 30 | Fill #7

## 2020-02-06 MED FILL — ALBUTEROL SULFATE HFA 108 (: 108 (90 BAS | 25 days supply | Qty: 18 | Fill #6

## 2020-02-10 ENCOUNTER — Telehealth: Payer: Self-pay | Admitting: *Deleted

## 2020-02-10 NOTE — Telephone Encounter (Signed)
Spoke with [name] and after several failed attempts to keep him in care and independently housed, the patient still needs more assistance.Even with arranging transportation the patient is still not able to remember appointments and is unable to read or write. I have suggested to Mr. Dicenzo that he consider assisted living. Explained to Mr Hutmacher the difference between assisted living and 24hr Nursing home. Explained to Mr Bonczek that with assisted living he can eave the facility at his leisure, have his medications managed by the facility, transportation offered, housekeeping and financial management. Mr. Stryker declined assisted living.  [facility] has also been called previously and since the patient is competent that cannot force him to move.

## 2020-03-01 MED FILL — SYMTUZA 800-150-200-10 MG T: 800-150-200 | 30 days supply | Qty: 30 | Fill #8

## 2020-03-01 MED FILL — ALBUTEROL SULFATE HFA 108 (: 108 (90 BAS | 25 days supply | Qty: 18 | Fill #7

## 2020-03-05 ENCOUNTER — Other Ambulatory Visit: Payer: Self-pay | Admitting: *Deleted

## 2020-03-06 ENCOUNTER — Encounter: Payer: Self-pay | Admitting: *Deleted

## 2020-03-06 ENCOUNTER — Other Ambulatory Visit: Payer: Self-pay

## 2020-03-06 NOTE — Patient Outreach (Signed)
[facility]) Care Management  03/06/2020  [name] 08-07-1955 [id number]   [facility] outreach to complex care patientfor follow up [facility] referral Date:04/26/19  No [place] admissions nor ED visits in the last 6 months  Outreachsuccessfulto hismobilenumber [phone] on 03/05/20 Patient is able to verify HIPAA ([place]) identifiers, date of birth (DOB) and address [facility] RN CM reviewed the purpose for outreach for monthly follow up for care coordination needs related upcoming appointment to infectious disease provider  [name] has not made his transportation arrangement for the appointment to return to see [name] on 03/07/20 1115. He did not recall the date of this appointment He reports he has not asked his aide for assistance. When [facility] RN CM inquired about his aide, [name] reports he had not seen the aide on 03/05/20  [facility] RN CM encouraged [name] to make his transportation arrangement with [facility] for the upcoming appointment independently He was given [facility] contact number again He read back the number for [facility] to [place] RN CM spoke with him about [facility] case closure for compliance issues (missed appointments, missed eye exams, Missed appointments for housing, Missed vaccines, etc  He voiced understanding and reports he will speak with his aid on 03/06/20   [facility] RN CM left a voice message for [place] Coordinator with [facility]) at [phone] on 03/06/20 at [place] CM received a call from [place], [place] care aide (not [place] home care) [phone] Allen Falero informed [name] of [facility] RN CN outreach on 03/05/20 [name] updated [facility] RN CM that he sees [name] 7 days a week in the mornings and had seen [name] on 03/05/20  He reports leaving [name] apartment at the time of this call 03/06/20 [place] voiced concern with [name] and [facility] RN CM collaborated and reviewed 1) Allen  Angerer missed MD appointments and need to get transportation via [facility] to his 03/07/20 1115 appointment with [name], infectious disease provider. [name] confirms he is familiar with [name] Medical history of HIV+. [name] has made the [facility] transportation arrangements for [name] to get to [name]. [name] reports he will attempt to make sure [name] can get on the [employer] [place] van/bus to each appointment [name] discusses with [facility] RN CM that [name] had not been sharing the needed appointments and transportation needs with him and has noted memory concerns.  [name] discussed noted concerns with possible literacy and vision with outreaches [name] discussed [name] and A [facility] ID RN Cm were assisting with appointments and transportation. Discussed [name] is no longer available in [name] office.[name] voiced understanding and willingness to assist more with [name] [facility] RN CM encouraged [name] to start keeping a file of [name] After summary visits or discharge sheets in the apartment to keep up with this better especially if [name] may be exhibiting some memory concerns Urology follow up is needed with Dr Louis Meckel. [name] gave [name] the number to Dr Louis Meckel office He is to assist with follow up on this appointment  2) Trellis Paganini, Housing Coordinator with [place] ([facility])/THP ([facility]) number was provided to [place], [facility] discussed [name] assistance with placement to [place] discusses he is familiar with [name] apartments as [name]'s father  lived there [name] offers to speak with "[name]" there about Allen Hillery needs [facility] RN CM discussed [name] sharing the [place] ([facility]) program would be a good program to get [name] connected with  [facility] RN CM shared the pending outreach to [name] with [name]  3)covid vaccine [name] has recently agreed to have one   [name] would like to connect [name] to the covid vaccine program via his  church  [facility] RN CM encouraged [name] to contact [facility] RN CM prn to assist with possible a Remote program to offer the covid vaccine  4) eye exam/glasses [facility] RN CM spoke with [name] about the importance of [name] getting an eye exam and glasses.  [name] reports [name] has mentioned this before to him but they have just not completed this process  5) Pest control [name] confirms the apartment is now bug free after use of boric acid and the  pest control session [facility] assisted [name] with. [name] and [name] discussed the importance of every other month pest control agreed by [name] at the apartment complex [name] was given [name] contact number to check on the next scheduled pest control date. He reports the apartment is now bug free "the bugs are now gone" and [name] has shampooed [name] rugs    [name] was encouraged to outreached to [facility] RN CM prn and he agreed to allow [facility] RN CM to outreach to him after the scheduled outreaches to [name] for further assist with helping [name] to be more compliant in his health care and social needs [facility] RN CM thanked [name] for his present and future assistance  [facility] RN CM will follow up with Allen Ryant within the next 21-28 business days- follow up appointment compliance, and progression with apartment, THP, eye exam and covid vaccination  Pt encouraged to return a call to [facility] RN CM prn Routed note to MD  Goals Addressed              This Visit's Progress     Patient Stated   .  patient will be able to manage his HTN, HIV and social needs at home with assist of community adult program aide (pt-stated)        CARE PLAN ENTRY (see longitudinal plan of care for additional care plan information)  Objective:  . Last practice recorded BP readings:  BP Readings from Last 3 Encounters:  01/10/20 (!) 178/69  09/15/19 (!) 140/58  07/21/19 (!) 162/60 .   Marland Kitchen Most recent eGFR/CrCl: No results found for: EGFR  No components found for: CRCL  Current Barriers:   Marland Kitchen Knowledge deficit related to self care management of hypertension . Cognitive Deficits  Case Manager Clinical Goal(s):  Marland Kitchen Over the next 45 days, patient will attend all scheduled medical appointments: pcp, ID MD, Urologist . Over the next 90 days, patient will verbalize basic understanding of hypertension disease process and self health management plan as evidenced by BP value <140/90   Interventions:  . Evaluation of current treatment plan related to hypertension self management and patient's adherence to plan as established by provider. . Reviewed medications with patient and discussed importance of compliance . Discussed plans with patient for ongoing care management follow up and provided patient with direct contact information for care management team . Reviewed scheduled/upcoming provider appointments including: to pcp, ID MD, urologist and [facility] transportation . Outreach to [employer], Housing Coordinator with [employer] (  [facility])/THP ([facility] project) and CAP (community adult programs) aide, [name] . Re reviewed numbers for [facility], MDs, Housing staff, Apartment staff  Patient Self Care Activities:  . Attends all scheduled provider appointments . Verbalize where to go to receive medical care . Verbalize collaboration with CAP Aide  Initial goal documentation Please see other previous [facility] care plan information listed in Epic under the flow sheet section         Phillipe Clemon L. [name], RN, BSN, [place] Coordinator Office number [phone] Mobile number [phone]  Main [facility] number [phone] Fax number [phone]

## 2020-03-07 ENCOUNTER — Encounter: Payer: Self-pay | Admitting: Internal Medicine

## 2020-03-07 ENCOUNTER — Other Ambulatory Visit: Payer: Self-pay | Admitting: *Deleted

## 2020-03-07 ENCOUNTER — Ambulatory Visit (INDEPENDENT_AMBULATORY_CARE_PROVIDER_SITE_OTHER): Payer: Medicare Other | Admitting: Internal Medicine

## 2020-03-07 DIAGNOSIS — B20 Human immunodeficiency virus [HIV] disease: Secondary | ICD-10-CM

## 2020-03-07 DIAGNOSIS — C679 Malignant neoplasm of bladder, unspecified: Secondary | ICD-10-CM

## 2020-03-07 DIAGNOSIS — F325 Major depressive disorder, single episode, in full remission: Secondary | ICD-10-CM | POA: Diagnosis not present

## 2020-03-07 NOTE — Assessment & Plan Note (Signed)
He still struggles with adherence to his antiretroviral regimen.  I asked him to do his best to take it every day.  I will get repeat lab work today and see him back in 6 weeks.  I asked him to bring in all his medication bottles at that time.

## 2020-03-07 NOTE — Assessment & Plan Note (Signed)
His depression remains in remission.

## 2020-03-07 NOTE — Patient Outreach (Addendum)
[facility]) Care Management  03/07/2020  [name] 1956-05-31 [id number]   [place] coordination-collaboration with [facility] [name]  [facility] RN CM called [facility] office and referred to another number to reach [name] [facility] RN CM spoke with [name] to collaborate and update her on the interventions completed with pt from [place] is still needing items to connect [name] with [facility] RN CM updated her that [place], Personal care services [facility]) aide was given her number to outreach to her on 03/06/20 [facility] RN CM provided PCS aide number for [name] to outreach to get the items needed to assist [name] reports [name] apartment is more of a section  8 housing area not assisted living   [place] notes [name] is noted to attend his appointment with [name] this am It is noted he had an elevated blood pressure (BP) It is noted [name] discussed compliance issues with [name] taking medications related to GI issues and his depression is in remission  Appointments  04/11/20 1445 [name] follow up  [facility] [place] RN CM will follow up with [name] within the next 21-28 business days- follow up appointment compliance, and progression with apartment, THP, eye exam and covid vaccination  Routed note to MD  Goals Addressed              This Visit's Progress     Patient Stated   .  patient will be able to manage his HTN, HIV and social needs at home with assist of community adult program aide (pt-stated)        CARE PLAN ENTRY (see longitudinal plan of care for additional care plan information)  Objective:  . Last practice recorded BP readings:  BP Readings from Last 3 Encounters:  03/07/20 (!) 193/80  01/10/20 (!) 178/69  09/15/19 (!) 140/58 .   Marland Kitchen Most recent eGFR/CrCl: No results found for: EGFR  No components found for: CRCL  Current Barriers:  Marland Kitchen Knowledge deficit related to self care management of hypertension . Cognitive Deficits  Case  Manager Clinical Goal(s):  Marland Kitchen Over the next 45 days, patient will attend all scheduled medical appointments: pcp, ID MD, Urologist . Over the next 90 days, patient will verbalize basic understanding of hypertension disease process and self health management plan as evidenced by BP value <140/90    Interventions:  . Evaluation of current treatment plan related to hypertension self management and patient's adherence to plan as established by provider. . Reviewed medications with patient and discussed importance of compliance . Discussed plans with patient for ongoing care management follow up and provided patient with direct contact information for care management team . Reviewed scheduled/upcoming provider appointments including: to pcp, ID MD, urologist and [facility] transportation . Outreach to [employer], Housing Coordinator with [employer] ([facility])/THP ([facility] project) and CAP (community adult programs) aide, [name] . Re reviewed numbers for [facility], MDs, Housing [name], [name] . Collaboration with [employer] [name], [name] and CAP (community adult programs) aide, [name]  Patient Self Care Activities:  . Attends all scheduled provider appointments . Verbalize where to go to receive medical care . Verbalize collaboration with CAP Aide . He did successfully go to 03/07/20 ID MD appointment  Initial goal documentation Please see other previous [facility] care plan information listed in Epic under the flow sheet section        Allen Willy L. [name], RN, BSN, [place]  Management Care Coordinator Office number [phone] Mobile number [phone]  Main [facility] number [phone] Fax number [phone]

## 2020-03-07 NOTE — Progress Notes (Signed)
Patient Active Problem List   Diagnosis Date Noted  . HIV disease ([place]) 04/18/2015    Priority: High  . Dysuria 05/29/2015    Priority: Medium  . Depression 04/19/2015    Priority: Medium  . 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 Malignant neoplasm of bladder ([place]) 03/30/2015    Priority: Medium  . Frequent falls 07/21/2019  . Abdominal pain 02/19/2017  . Exacerbation of asthma 09/09/2016  . Acute respiratory failure with hypoxia ([place]) 09/09/2016  . Unintentional weight loss 07/01/2016  . Anorexia 03/11/2016  . Syncope 10/31/2015  . HTN (hypertension) 03/30/2015  . Asthma, chronic 03/30/2015    Patient's Medications  New Prescriptions   No medications on file  Previous Medications   AMLODIPINE (NORVASC) 5 MG TABLET    TAKE 1 TABLET (5 MG TOTAL) BY MOUTH DAILY.   CEPHALEXIN (KEFLEX) 500 MG CAPSULE    TAKE 1 CAPSULE BY MOUTH THREE TIMES A DAY   CETIRIZINE (ZYRTEC) 10 MG TABLET    Take 1 tablet (10 mg total) by mouth at bedtime.   DARUNAVIR-COBICISCTAT-EMTRICITABINE-TENOFOVIR ALAFENAMIDE (SYMTUZA) 800-150-200-10 MG TABS    Take 1 tablet by mouth daily with breakfast.   DOXYCYCLINE (VIBRA-TABS) 100 MG TABLET    Take 100 mg by mouth 2 (two) times daily.   ENSURE (ENSURE)    Take 237 mLs by mouth 2 (two) times daily between meals. Decreased Nutritional intake due to Bladder Cancer treatment and uncontrolled HIV   IPRATROPIUM-ALBUTEROL (DUONEB) 0.5-2.5 (3) MG/3ML SOLN    Take 3 mLs by nebulization every 6 (six) hours as needed (wheezing and chest tightness).   MEGESTROL (MEGACE) 400 MG/10ML SUSPENSION    Take 10 mLs (400 mg total) by mouth daily.   ONDANSETRON (ZOFRAN) 4 MG TABLET    Take 1 tablet (4 mg total) by mouth every 8 (eight) hours as needed for nausea or vomiting.   SULFAMETHOXAZOLE-TRIMETHOPRIM (BACTRIM DS) 800-160 MG TABLET    Take 1 tablet by mouth 2 (two) times daily.   VENTOLIN HFA 108 (90 BASE) MCG/ACT INHALER    INHALE 2  PUFFS BY MOUTH INTO THE LUNGS EVERY 6 HOURS AS NEEDED FOR WHEEZING OR SHORTNESS OF BREATH   VITAMIN D, ERGOCALCIFEROL, (DRISDOL) 1.25 MG (50000 UT) CAPS CAPSULE    Take 1 capsule (50,000 Units total) by mouth every 7 (seven) days.  Modified Medications   No medications on file  Discontinued Medications   No medications on file    Subjective: [name] is in for his routine HIV follow-up visit.  He did get restarted on Symtuza after his last visit 2 months ago.  He says that he has missed about 4 days over the past 2 months.  This occurred when he had upset stomach and chose not to take it.  He is feeling better now.  He has not had any problems with hematuria recently.  He says that he needs to make an appointment to follow-up with his urologist.  He denies feeling anxious or depressed.  A friend is going to take him to get his first Covid vaccine later this week.  Review of Systems: Review of Systems  Constitutional: Negative for fever and weight loss.  Respiratory: Negative for cough and shortness of breath.   Cardiovascular: Negative for chest pain.  Gastrointestinal: Negative for abdominal pain, diarrhea, nausea and vomiting.  Genitourinary: Negative for hematuria.  Psychiatric/Behavioral: Negative for depression. The patient is not nervous/anxious.  Past Medical History:  Diagnosis Date  . Anorexia   . Asthma with COPD ([place])   . Bladder cancer (HCC)    and prostatic urethra cancer  HX TURBT'S IN [place], [place]  WITH INSTILLATION CHEMO TX'S  . Bladder tumor   . History of transfusion   . HIV disease ([place])    under care of [place] [facility]  . Hypertension   . Nocturia   . Vitamin D deficiency     Social History   Tobacco Use  . Smoking status: Current Every Day Smoker    Packs/day: 0.10    Years: 17.00    Pack years: 1.70    Types: Cigarettes  . Smokeless tobacco: Never Used  . Tobacco comment: 3/day;  Vaping Use  . Vaping Use: Never used    Substance Use Topics  . Alcohol use: Yes    Alcohol/week: 6.0 standard drinks    Types: 6 Cans of beer per week    Comment: occasional  . Drug use: Yes    Types: Marijuana    Comment: occassional    Family History  Problem Relation Age of Onset  . Cancer Mother        Gastric Cancer  . Hypertension Mother   . Heart disease Mother   . Cancer Maternal Grandmother        breast cancer     Allergies  Allergen Reactions  . Aspirin Shortness Of Breath, Nausea And Vomiting and Swelling    Health Maintenance  Topic Date Due  . COVID-19 Vaccine (1) Never done  . COLONOSCOPY  Never done  . PNA vac Low Risk Adult (1 of 2 - PCV13) Never done  . INFLUENZA VACCINE  03/04/2020  . TETANUS/TDAP  04/02/2027 (Originally 10/04/1974)  . Hepatitis C Screening  Completed    Objective:  Vitals:   03/07/20 1120  BP: (!) 193/80  Pulse: 66  Temp: 97.6 F (36.4 C)  TempSrc: Oral  Weight: 120 lb (54.4 kg)   Body mass index is 20.6 kg/m.  Physical Exam Constitutional:      Comments: He is pleasant and in no distress.  Cardiovascular:     Rate and Rhythm: Normal rate and regular rhythm.     Heart sounds: No murmur heard.   Pulmonary:     Effort: Pulmonary effort is normal.     Breath sounds: Normal breath sounds.  Abdominal:     Palpations: Abdomen is soft.     Tenderness: There is no abdominal tenderness.  Psychiatric:        Mood and Affect: Mood normal.     Lab Results Lab Results  Component Value Date   WBC 3.0 (L) 01/19/2020   HGB 12.6 (L) 01/19/2020   HCT 36.7 (L) 01/19/2020   MCV 100.8 (H) 01/19/2020   PLT 191 01/19/2020    Lab Results  Component Value Date   CREATININE 0.82 01/19/2020   BUN 11 01/19/2020   NA 121 (L) 01/19/2020   K 4.6 01/19/2020   CL 93 (L) 01/19/2020   CO2 20 01/19/2020    Lab Results  Component Value Date   ALT 26 01/19/2020   AST 71 (H) 01/19/2020   ALKPHOS 157 (H) 05/11/2019   BILITOT 0.4 01/19/2020    Lab Results  Component  Value Date   CHOL 99 (L) 05/11/2019   HDL 71 05/11/2019   LDLCALC 17 05/11/2019   TRIG 40 05/11/2019   CHOLHDL 1.4 05/11/2019   Lab Results  Component Value Date   LABRPR NON-REACTIVE 01/19/2020   HIV 1 RNA Quant (copies/mL)  Date Value  01/19/2020 5,980 (H)  07/21/2019 99,100 (H)  05/17/2019 6,150 (H)   CD4 T Cell Abs (/uL)  Date Value  01/19/2020 458  07/21/2019 655  05/17/2019 771     Problem List Items Addressed This Visit      High   HIV disease ([place])    He still struggles with adherence to his antiretroviral regimen.  I asked him to do his best to take it every day.  I will get repeat lab work today and see him back in 6 weeks.  I asked him to bring in all his medication bottles at that time.      Relevant Orders   T-helper cell (CD4)- ([name] clinic only)   HIV-1 RNA quant-no reflex-bld     Medium   Malignant neoplasm of bladder ([place])    I encouraged him to make an appointment with Dr. Louis Meckel, his urologist, in the near future.      Depression    His depression remains in remission.           Michel Bickers, MD [facility] [phone] pager   [phone] cell 03/07/2020, 11:37 AM

## 2020-03-07 NOTE — Assessment & Plan Note (Signed)
I encouraged him to make an appointment with Dr. Louis Meckel, his urologist, in the near future.

## 2020-03-08 LAB — T-HELPER CELL (CD4) - (RCID CLINIC ONLY)
CD4 % Helper T Cell: 39 % (ref 33–65)
CD4 T Cell Abs: 435 /uL (ref 400–1790)

## 2020-03-09 LAB — HIV-1 RNA QUANT-NO REFLEX-BLD
HIV 1 RNA Quant: 10000 Copies/mL — ABNORMAL HIGH
HIV-1 RNA Quant, Log: 4 Log cps/mL — ABNORMAL HIGH

## 2020-03-15 ENCOUNTER — Telehealth: Payer: Self-pay | Admitting: *Deleted

## 2020-03-15 NOTE — Telephone Encounter (Signed)
The intent of this communication is to inform the Health Care Team that this patient will be discharged from [facility]).  [name] would be best served in an assisted living facility but at this time he has declined. Currently he has [facility] RN case management services continuing to assist him.Moving forward, the [facility] will be willing to reopen the patient to services if and when the patient is ready to discuss medication adherence and HIV disease  management. Effective 02/13/20 patient will be discharged and removed from [facility] active patient listing.

## 2020-03-29 MED FILL — ALBUTEROL SULFATE HFA 108 (: 108 (90 BAS | 25 days supply | Qty: 18 | Fill #8

## 2020-03-29 MED FILL — SYMTUZA 800-150-200-10 MG T: 800-150-200 | 30 days supply | Qty: 30 | Fill #9

## 2020-04-06 ENCOUNTER — Other Ambulatory Visit: Payer: Self-pay | Admitting: *Deleted

## 2020-04-06 ENCOUNTER — Encounter: Payer: Self-pay | Admitting: *Deleted

## 2020-04-06 ENCOUNTER — Other Ambulatory Visit: Payer: Self-pay

## 2020-04-06 NOTE — Patient Outreach (Signed)
[name]) Care Management  04/06/2020  [name] 1956-07-10 [id number]   [facility] outreachtocomplex care patientfor follow up [facility] referral Date:04/26/19  No [name] admissions nor ED visits in the last 6 months  Outreachsuccessfulto hismobilenumber [phone] on 03/05/20 Patient is able to verify HIPAA ([name]) identifiers, date of birth (DOB) and address [facility] RN CM reviewed the purpose for outreach for monthly follow up for care coordination needs related upcoming appointment to infectious disease provider  Today [name] is reporting hearing issues He was instructed on how to adjust his mobile phone   Appointments  Has been to see [name] on 03/07/20 He is to follow up on 04/11/20  Reviewed MD notes with [name] He confirms he now has a calendar up in his bedroom to remind him of his appointments  He was encourage keep all his After summary sheet/hospital discharge sheets in one place He was educated on the importance of keeping some of these forms to have a list of his medicines, doctors and appointments He voiced understanding   Medications Reports taking symtuza as ordered  He reports his stomach was upset on Monday or Tuesday He reported nausea with emesis  He reports emesis before taking his medicine  He confirms having a hx of chronic GI issues  He confirms he has run out of nausea medications, Zofran   [name] discussed an action plan that included him purchasing  Zofran and to taking Zofran taking routinely especially prior to any intake of food or medicines He reports he  previously would do this and it helped him to have a better intake [facility]  [address] [facility] closer to him when he was able to ride the city bus. [facility] RN CM discussed with him changing [facility] a [facility] as he is no longer able to ride the bus. [facility]    Covid  Report he took a covid  shot about 2 1/2 weeks ago He report he believes he is scheduled to return on 04/12/20 to [facility]  To get his second shot . He reports having symptoms of head pain, stomach pain, arm pain at the injection site He reports he was told by the staff who provided the injection that he may experience these symptoms for up to 3 days    [name] apartment [place] Pt reports his care aide has called the apartment complex and is pending a return call . Pt states he is willing to move to [name] apartments vs remain in his apartment complex. Encouraged him to follow up with [name] staff   Urologist  He states he called the urologist and was informed he needed a referral from his .PCP   Eyeglasses He reports going to [place] in the last 1-2 weeks to check on eyeglasses with his aide but ws informed he needed to go to a vision center to that completes exams and offer prescription glasses   Smoking He states he continues to smoke and feels he is "too late to stop" Encouragement provided   Nutrition he reports he take Ensure twice a day when he remembers He eats about twice a day but confirms his appetite remains poor. He denies issues with getting ensure  Wt 120 lbs on 03/07/20 He had gained 18 lbs in 8 months from 05/2019 (207 lbs) to 01/2019 (122 lbs)   Outreach to personal care  aide, [name]  With [name] permission [phone]  [facility] RN reviewed assessment with [name] [name] is reported to not provide appropriate response and continues with noted memory issues  [name] reports he made a medical file for [name] in his apartment to include his After summary sheet/hospital discharge sheets  [name] to take him to [address] to get glasses and assist him in scheduling an urologist appointment Confirm [name] is not eating are drinking well  [name] spoke with [name] at [name] apartment who confirmed there is no available apartments for [name] at this time. Confirms [name] is on  awaiting  List. Inquired if [name] has discussed assisted living facilities as this is mentioned in [place] 03/15/20 noted in [place]. [name] reports [name] had not mentioned this. [name] reports [name] does have periodical visits from one of his daughters and a brother Discussed the next follow up outreach to [name]   [facility] RN CM will follow up with [name] in the next 30-35 business days Pt encouraged to return a call to [facility] RN CM prn Routed note to MD Goals Addressed              This Visit's Progress     Patient Stated     patient will be able to manage his HTN, HIV and social needs at home with assist of community adult program aide (pt-stated)   Not on track     [place] (see longitudinal plan of care for additional care plan information)  Objective:   Last practice recorded BP readings:  BP Readings from Last 3 Encounters:  03/07/20 (!) 193/80  01/10/20 (!) 178/69  09/15/19 (!) 140/58     Most recent eGFR/CrCl: No results found for: EGFR  No components found for: CRCL  Current Barriers:   Knowledge deficit related to self care management of hypertension  Cognitive Deficits  Case Manager Clinical Goal(s):   Over the next 45 days, patient will attend all scheduled medical appointments: pcp, ID MD, Urologist  Over the next 90 days, patient will verbalize basic understanding of hypertension disease process and self health management plan as evidenced by BP value <140/90   04/06/20 Pt has only been to his ID MD NOT pCP nor Urology Pt with an elevated BP at 03/07/20 ID MD visit of 193/80 but reports taking medication Has not obtained glasses or sought better housing Has obtained covid shot x 1   Interventions:   Evaluation of current treatment plan related to hypertension self management and patient's adherence to plan as established by provider.  Reviewed medications with patient and discussed importance of compliance  Discussed plans with patient for  ongoing care management follow up and provided patient with direct contact information for care management team  Reviewed scheduled/upcoming provider appointments including: to pcp, ID MD, urologist and SCAT transportation  Outreach to Trellis Paganini, Housing Coordinator with [employer] (CC[facility])/THP ([facility]) and CAP (community adult programs) aide, Landscape architect  Re reviewed numbers for [employer], MDs, Housing staff, Secretary/administrator with [employer], Butch Penny and CAP (community adult programs) aide, Landscape architect  Patient Self Care Activities:   Attends all scheduled provider appointments  Verbalize where to go to receive medical care  Verbalize collaboration with CAP Aide  He did successfully go to 03/07/20 ID MD appointment   Get second covid shot  Purchase and take zofran before eating or drinking  Please see past updates related to this goal by clicking on the "Past Updates" button in the selected goal  Please see other previous [facility] care plan information listed in Epic under the flow sheet section         Tag Wurtz L. [name], RN, BSN, [place] Coordinator Office number [phone] Mobile number [phone]  Main [facility] number [phone] Fax number [phone]

## 2020-04-11 ENCOUNTER — Ambulatory Visit: Payer: Medicare Other | Admitting: Internal Medicine

## 2020-04-24 MED FILL — ALBUTEROL SULFATE HFA 108 (: 108 (90 BAS | 25 days supply | Qty: 18 | Fill #9

## 2020-04-24 MED FILL — SYMTUZA 800-150-200-10 MG T: 800-150-200 | 30 days supply | Qty: 30 | Fill #10

## 2020-05-09 ENCOUNTER — Other Ambulatory Visit: Payer: Self-pay | Admitting: *Deleted

## 2020-05-09 ENCOUNTER — Other Ambulatory Visit: Payer: Self-pay

## 2020-05-09 NOTE — Patient Outreach (Signed)
[place] [facility]) Care Management  05/09/2020  [name] 14-Jun-1956 [id number]   [facility] outreachtocomplex care patientfor follow up [facility] referral Date:04/26/19  No [place] admissions nor ED visits in the last 6 months  Outreachsuccessfulto hismobilenumber [phone] Patient is able to verify HIPAA ([place] and Accountability Act) identifiers, date of birth (DOB) and address Had issues recalling address with HIPAA verification today  [name] offered some prompting and he was able to then provide the correct address He reports he has noted that he is "forgetting some small things" [facility] RN CM reviewed the purpose for outreach for monthly follow up for care coordination needsrelated upcoming appointment to infectious disease provider    Appointments  Has been to see [name] last on 03/07/20 He was scheduled for a follow up on 04/11/20 but Epic indicates no show He voiced he was surprised when informed he missed his 04/11/20 appointment "I did not mean to miss it.  It just slipped my mind."  He confirms the action plan to use a calendar to remember his appointment is still being used He confirms he does have a calendar up in his bedroom to remind him of his appointments     Medications Reports taking BP medication, symtuza as ordered    Covid  Report he took his first covid shot He reported he was scheduled to return on 04/12/20 to [place] to get his second shot but has not as of 05/09/20 . He reported having symptoms of head pain, stomach pain, arm pain at the injection site after first shot. Discussed with him the recommendation to get a third booster shot. He reports his youngest brother, niece and nephew had covid but has recovered     [name] [place] Pt reports his care aide has called the apartment complex and is pending a return call still as of 05/09/20. Pt states he is willing to move to [name] apartments vs  remain in his apartment complex. Encouraged him to follow up with [name] staff   Urologist  05/09/20 He reports he still has not seen his urologist IN (/2021 He states he called the urologist and was informed he needed a referral from his .PCP   Eyeglasses As of 10/6 /21 He still has not attempted to get vision check He reported in September 2021 going to [place]  to check on eyeglasses with his aide but was informed he needed to go to a vision center to that completes exams and offer prescription glasses   Smoking He states he continues to smoke   Nutrition he reports his appetite is good He does not have a scale at home He spoke with his sister on the telephone about getting a scale and reports he may purchase one from [employer] He reports 2 sisters in [place], one in [place] and one in [place] He reports decrease in Ensure intake as he noted some diarrhea only for a few days   Wt 120 lbs on 03/07/20 He had gained 18 lbs in 8 months from 05/2019 (207 lbs) to 01/2019 (122 lbs)   Outreach to personal care aide, [name]  With [name] permission [phone]  No answer voice message left with [facility] RN CM office number  ** [name] is not progressing through [facility] care plan  He continues to miss medical appointments, not complete vaccinations, not complete other preventive care interventions He reports "waiting on" others like his aide or  family, he reports is not reliable. He is not initiating any interventions  He does admit to some memory concerns   [facility] RN CM will follow up with [name] in the next 30-35 business days and plan for case closure if no progression is made by November 2021 Pt encouraged to return a call to [facility] RN CM prn Routed note to MD Goals Addressed              This Visit's Progress     Patient Stated   .  [facility]) patient will be able to manage his HTN, HIV and social needs at home with assist of community adult program aide (pt-stated)   Not on track      [place] (see longitudinal plan of care for additional care plan information)  Objective:  . Last practice recorded BP readings:  BP Readings from Last 3 Encounters:  03/07/20 (!) 193/80  01/10/20 (!) 178/69  09/15/19 (!) 140/58 .   Marland Kitchen Most recent eGFR/CrCl: No results found for: EGFR  No components found for: CRCL  Current Barriers:  Marland Kitchen Knowledge deficit related to self care management of hypertension . Cognitive Deficits  Case Manager Clinical Goal(s):  Marland Kitchen Over the next 45 days, patient will attend all scheduled medical appointments: pcp, ID MD, Urologist . Over the next 90 days, patient will verbalize basic understanding of hypertension disease process and self health management plan as evidenced by BP value <140/90  . 04/06/20 Pt has only been to his ID MD NOT pCP nor Urology Pt with an elevated BP at 03/07/20 ID MD visit of 193/80 but reports taking medication Has not obtained glasses or sought better housing Has obtained covid shot x 1 . 05/09/20 not progressing through [facility] care plan  He continues to miss medical appointments, not complete vaccinations, not complete other preventive care interventions He reports "waiting on" others like his aide or family, he reports is not reliable. He is not initiating any interventions  He does admit to some memory concerns   Interventions:  . Evaluation of current treatment plan related to hypertension self management and patient's adherence to plan as established by provider. . Reviewed medications with patient and discussed importance of compliance . Discussed plans with patient for ongoing care management follow up and provided patient with direct contact information for care management team . Reviewed scheduled/upcoming provider appointments including: to pcp, ID MD, urologist and SCAT transportation . Outreach to [employer], Housing Coordinator with [employer] ([facility])/THP ([facility]) and CAP  (community adult programs) aide, [place] . Re reviewed numbers for SCAT, MDs, Housing staff, [place] staff . Collaboration with [employer] staff, Butch Penny and CAP (community adult programs) aide, Landscape architect  Patient Self Care Activities:  . Attends all scheduled provider appointments . Verbalize where to go to receive medical care . Verbalize collaboration with CAP Aide . He did successfully go to 03/07/20 ID MD appointment  . Get second covid shot . Purchase and take zofran before eating or drinking   Please see past updates related to this goal by clicking on the "Past Updates" button in the selected goal  Please see other previous [facility] care plan information listed in Epic under the flow sheet section         L. [name], RN, BSN, [place] Coordinator Office number [phone] Main [facility] number [phone] Fax number [phone]

## 2020-05-28 ENCOUNTER — Other Ambulatory Visit: Payer: Self-pay | Admitting: Internal Medicine

## 2020-05-28 DIAGNOSIS — B20 Human immunodeficiency virus [HIV] disease: Secondary | ICD-10-CM

## 2020-05-28 MED FILL — ALBUTEROL SULFATE HFA 108 (: 108 (90 BAS | 25 days supply | Qty: 18 | Fill #10

## 2020-05-28 MED FILL — SYMTUZA 800-150-200-10 MG T: 800-150-200 | 30 days supply | Qty: 30 | Fill #0

## 2020-05-29 ENCOUNTER — Telehealth: Payer: Self-pay

## 2020-05-29 NOTE — Telephone Encounter (Signed)
Called to get patient a follow up scheduled, no answer or voicemail set up

## 2020-06-06 ENCOUNTER — Other Ambulatory Visit: Payer: Self-pay | Admitting: *Deleted

## 2020-06-06 ENCOUNTER — Other Ambulatory Visit: Payer: Self-pay

## 2020-06-06 NOTE — Patient Outreach (Signed)
[facility]) Care Management  06/06/2020  Dany Walther 1955-11-26 [id number]   [facility] outreachtocomplex care patientfor follow up [facility] referral Date:04/26/19  No [place] admissions nor ED visits in the last 6 months  Outreachsuccessfulto hismobilenumber [phone] Patient is able to verify HIPAA ([place] and Accountability Act) identifiers, date of birth (DOB) and address Had issues recalling address with HIPAA verification today  [name] offered some prompting and he was able to then provide the correct address He reports he has noted that he is "forgetting some small things" [facility] RN CM reviewed the purpose for outreach for monthly follow up for care coordination needsrelated upcoming appointment to infectious disease provider    Appointments Has been to see [name] last on 03/07/20 He was scheduled for a follow up on 04/11/20 but Epic indicates no show He has a follow up appointment in Epic for 07/03/20 at 1400 to see [name] and 06/15/20 at 1340 to see pcp Kathe Becton. Mr Deskin reports he was called by someone to make these appointments.  [name] reviewed these appointments and had him to repeat the appointments using teach back method.  [facility] RN CM encouraged him to work with his Personal care services [facility]) aide to schedule his transportation to these appointments He states he has a card with SCAT number and can make the appointments himself   Asthma He reports he is doing well and uses his inhaler for shortness of breath (sob) He reports his pests are controlled and he allowed pest control staff for the apartment complex to exterminate his apartment for the month of October 2021 as he sat outside He also continues to have his PCS aide to use non aerosol products as needed    Covid Report he took his first covid shot He reported he was scheduled to return on 04/12/20 to [place] to get his second shot but has not as of  06/06/20 He inquired if his pcp could assist and was encouraged to inquire on 06/15/20.    [name] apartment/housingPt reports his care aide is pending a response still as of 06/06/20. Encouraged him to follow up with [name] staff   Urologist  06/06/20 He reports he still has not seen his urologist   Eyeglasses As of 06/06/20 He still has not attempted to get vision check He reported in September 2021 going to [facility]  to check on eyeglasses with his aide but was informed he needed to go to a vision center to that completes exams and offer prescription glasses   SmokingHe states he continues to smoke   Nutritionhe reports his appetite is good He reports he has not lost weight  Wt 120 lbs on 03/07/20 He had gained 18 lbs in 8 months from 05/2019 (207 lbs) to 01/2019 (122 lbs)   Outreach to personal care aide, [name] With Mr Lucier permission [phone] on 05/09/20 No answer voice message left with [facility] RN CM office number No return calls as of 06/06/20  ** Mr Nyman is not progressing through [facility] care plan well  Since the last outreach he is only noted to obtain follow up appointments to his MDs He continues not complete vaccinations, not complete other preventive care interventions He is not initiating any interventions     [facility] RN CM will follow up with Mr Bisping in the next 30-35 business days and plan for case closure if no progression is made by December 2021 Pt  encouraged to return a call to [facility] RN CM prn Routed note to MD  Goals Addressed              This Visit's Progress     Patient Stated   .  [facility]) Find Help in My Community (pt-stated)   Not on track     Follow Up Date 07/05/20    - follow-up on any referrals for help I am given - make a list of family or friends that I can call    Why is this important?   Knowing how and where to find help for yourself or family in your neighborhood and community is an important skill.  You will want to take  some steps to learn how.    Notes:  not progressing through [facility] care plan well  Since the last outreach he is only noted to obtain follow up appointments to his MDs He continues not complete vaccinations, not complete other preventive care interventions He is not initiating any interventions     .  Track and Manage My Symptoms (pt-stated)   On track     Follow Up Date  07/05/20    - eliminate symptom triggers at home - follow asthma action plan - keep follow-up appointments - keep rescue medicines on hand       Notes: 06/06/20 doing well and uses his inhaler for shortness of breath (sob) He reports his pests are controlled and he allowed pest control staff for the apartment complex to exterminate his apartment for the month of October 2021        Motty Borin L. [name], RN, BSN, [place] Coordinator Office number [phone] Main [facility] number [phone] Fax number [phone]

## 2020-06-15 ENCOUNTER — Ambulatory Visit: Payer: Medicare Other | Admitting: Family Medicine

## 2020-06-22 MED FILL — ALBUTEROL SULFATE HFA 108 (: 108 (90 BAS | 25 days supply | Qty: 18 | Fill #11

## 2020-06-22 MED FILL — SYMTUZA 800-150-200-10 MG T: 800-150-200 | 30 days supply | Qty: 30 | Fill #1

## 2020-07-02 ENCOUNTER — Ambulatory Visit: Payer: Medicare Other | Admitting: Family Medicine

## 2020-07-03 ENCOUNTER — Ambulatory Visit: Payer: Medicare Other | Admitting: Internal Medicine

## 2020-07-04 ENCOUNTER — Encounter: Payer: Self-pay | Admitting: Family Medicine

## 2020-07-05 ENCOUNTER — Other Ambulatory Visit: Payer: Self-pay | Admitting: *Deleted

## 2020-07-05 ENCOUNTER — Other Ambulatory Visit: Payer: Self-pay

## 2020-07-05 NOTE — Patient Outreach (Signed)
[facility]) Care Management  07/05/2020  [name] 07/04/1956 [id number]   [name] care patientfor follow up [name] referral Date:04/26/19 for EMMI join referral  Pt engagement score 13. He is in need of several resources such as transportation, food, and education pertaining to his wellbeing. He also states he has trouble with walking which has caused him to fall on multiple occasions.  Insurance:[employer] and [place] medicaid [name] access No [place] admissions nor ED visits in the last 6 months    Follow up outreach [name] RN CM outreached to the now listed home and mobile number of [name] CM is familiar with this number as the mobile number for [name] [facility] Personal care services (PCS) aide, [name] answered and [facility] RN CM greeted him and inquired if [name] was okay and if his personal mobile number had been disconnected. [name] confirmed [name] mobile number [phone] has not been disconnected. [name] inquired when [name] RN CM was coming to visit. [name] discussed no plans for Allen home visit to [name]. [name] requested to outreach to [facility] RN CM at Allen later time as he reports he is with Allen patient  Outreach to [phone] and [phone] without answer at Both numbers His personal care aide returned Allen call to [name] informed [name] RN CM he took patient to MD visit on 07/02/20 and reports [name] was informed he needs to "eat better" [name] RN CM discussed that [name] has Allen  chronic wt loss concern related to his medical issues  [name] states he has encouraged pt to take in  Ensure twice Allen day and [name] reports he has assisted in preparing meals for pt  No glasses obtained at this time, reports pt "[place] go [place]" [name] reports [name] is waiting on Allen copy of his certificate   [name] called to [name] RN CM  He is able to verify HIPAA ([name] and [name]) identifiers Reviewed and  addressed the purpose of the follow up call with the patient  Consent: [name] ([name]) RN CM reviewed [name] services with patient. Patient gave verbal consent for services. He reports he missed his phone call as he was in the restroom  [facility] RN CM reviewed the call with [name] [name] states he attended his appointments on Monday and Tuesday But Epic is not containing notes from [name] nor [name] Reports GI symptoms  [facility] RN CM encouraged [name] to discuss with pcp and/or GI  He reports he started diarrhea last night 07/04/20 Encouraged intake of lukewarm water/fluids to prevent dehydration and emesis He voiced understanding  He reports issues about getting his birth certificate to get on section 8 and to re establish his personal care services. He states his personal care services with [name] has ended but [name] still stops by to check on him.  His brother, [name] is reported to be attempting to assist to get Allen copy of his birth certificate online. His brother informed patient it would cost "fifty dollars" to get Allen birth certificate [name] does not have driver licenses at this time.  [name] searched and found [name] long form birth certificates; initiates and completes corrections. [name] Hours: 8:30 Allen.m. - 5:00 p.m. ([phone]   [facility] RN CM completed Allen conference call with  patient to [name] about the status of his birth certificate Spoke with [name] at [phone] to be informed the birth certificate is not "searchable" ( this means it has not been found over Allen year + ) She referred pt to  [phone] vitals records/check to order his birth certificate over the phone so he can have it sent to his address [facility] RN CM completed Allen conference call with patient to [name] Check [phone] Spoke with [name] at [name] records  [name] provided her all the information needed but was not able to recall the spelling of his mother's  maiden name He could only recall her as [name] married to [place] RN CM and [name] attempted online searches without success  This information is not scanned in Epic (media, documentation) area)  During the call with Allen [facility]old RN CM attempted to outreach to [name] but it was closed for lunch hours and to brother [name] but there was not an answer. [name] reports [name] may be in dialysis  [name] RN CM was able to outreach to triage nurse at [name] [phone] prompt #4)  to attempt to inquire about Allen possible birth certificate for the pt as he states [name] assisted him with applications for local services in the past  [name] was confirmed as Allen no show for his appointment to [name] on 07/03/20 per [name], triage nurse  [name] to attempt to have [name] call [name] RN CM for possible assistance with birth certificate or the [facility] services   [name] RN CM left Allen message for his brother [name] [phone] to attempt to get assistance with his mother's maiden name    Plans [name] RN CM will follow up with [name] prn or in the next 30-35 business daysand plan for case closure if no progression continues to be made and he can be connected with other services  Pt encouraged to return Allen call to [facility] RN CM prn Routed note to MD  Goals      Patient Stated   .  [name]) Find Help in My Community (pt-stated)      Follow Up Date 08/15/20   - follow-up on any referrals for help I am given - make Allen list of family or friends that I can call     Notes: 07/05/20 07/05/20 not following up Missed appointments to pcp and ID MD - No showed personal care services lost, attempt to get section 8 need birth certificate 42/5/95 not progressing through Allen [facility] care plan well  Since the last outreach he is only noted to obtain follow up appointments to his MDs He continues not complete vaccinations, not complete other preventive care interventions He is not  initiating any interventions     .  Track and Manage My Symptoms (pt-stated)      Follow Up Date  08/15/20   - eliminate symptom triggers at home - follow asthma action plan - keep follow-up appointments - keep rescue medicines on hand       Notes: 07/05/20 Missed appointments to pcp and ID MD - No showed personal care services lost, attempt to get section 8 need birth certificate  [birth year] doing well and uses his inhaler for shortness of breath (sob) He reports his pests are controlled and he allowed pest control staff for the apartment complex to exterminate his apartment for the month of October 2021  Dandria Griego L. [name], RN, BSN, [place] Coordinator [name] number [phone] Main [place] [facility] [name] number [phone] Fax number [phone]

## 2020-07-06 ENCOUNTER — Other Ambulatory Visit: Payer: Self-pay | Admitting: *Deleted

## 2020-07-06 NOTE — Patient Outreach (Signed)
[facility]) Care Management  07/06/2020  [name] Oct 08, 1955 [id number]   [facility]re[facility]plex care patientfor care coordination [facility] referral Date:04/26/19 for EMMI join referral  Pt engagementscore 13. He is in need of several resources such as transportation, food, and education pertaining to his wellbeing. He also states he has trouble with walking which has caused him to fall on multiple occasions. [place] access No [place] admissions nor ED visits in the last 6 months  He is able to verify HIPAA ([place] and [place]) identifiers Reviewed and addressed the purpose of the follow up call with the patient  Consent: [facility] RN CM reviewed [name] services with patient. Patient gave verbal consent for services   [facility] RN CM completed a conference call with patient to Vital Check [phone] He spoke with [name] at Vital records  He was assisted with the correct spelling of his mother's maiden name, [name] [name] completed his processing with [name] and was informed he would have it delivered to him within 6-8 days via carrier  [name] agreed to process this form for carrier delivery as he reports he needs the information as soon as possible and would not prefer to wait on the [place] mail services (7-10 days)  [name] RN CM sent [name] a letter for his records with the Vital check name, contact number, [name]'s name, plus the order and pin numbers, he was not able to write down    Plans [name] RN CM will follow up with [name] prn or in the next 30-35 business daysand plan for case closure if no progression continues to be made and he can be connected with other services  Pt encouraged to return a call to [facility] RN CM prn  Goals      Patient Stated   .  [name]) Find Help in My Community (pt-stated)      Follow Up Date 08/15/20   - follow-up on any referrals for help  I am given - make a list of family or friends that I can call      Notes: 07/06/20 collaborate with [name] Assisted to outreach to vital check to get document sent to pt- see notes for 07/06/20 07/05/20 07/05/20 not following up Missed appts to pcp and ID MD - No showed personal care services lost, attempt to get section 8 need birth certificate 37/6/28 not progressing through [name] care plan well  Since the last outreach he is only noted to obtain follow up appointments to his MDs He continues not complete vaccinations, not complete other preventive care interventions He is not initiating any interventions     .  Track and Manage My Symptoms (pt-stated)      Follow Up Date  08/15/20   - eliminate symptom triggers at home - follow asthma action plan - keep follow-up appointments - keep rescue medicines on hand       Notes: 07/05/20 Missed appts to pcp and ID MD - No showed personal care services lost, attempt to get section 8 need birth certificate  31/5/17 doing well and uses his inhaler for shortness of breath (sob) He reports his pests are controlled and he allowed pest control staff for the apartment complex to exterminate his apartment for the month of October 2021        [name] L. Allen Hamman, RN, BSN, [place] Coordinator Office number [phone] Main  [facility] number [phone] Fax number [phone]

## 2020-07-06 NOTE — Patient Outreach (Signed)
[facility]) Care Management  07/06/2020  [name] [birth year] [id number]   [place] coordination-social needs  [facility] RN CM received a return call from [name] brother [name] [phone] [name] was able to clarify and spell their mother's maiden name [name]  She had 11 children   [name] states he attempted to assist the patient but he was not ready to pay for the records when he was assisting [name] reports it is best for [name] to get the [facility] record sent to him via mail [name] reports he will assist [name] to get the section 8 and Personal care services (PCS) aide company copies of the record needed  [facility] RN CM thanked [name] for his assistance  He reports he will assist any time he is needed    Plan [facility] RN CM will outreach to patient to assist with re contacting Vital Records [name] to get records sent to him  Talmage L. [name], RN, BSN, [place] Coordinator Office number [phone] Mobile number [phone]  Main [facility] number [phone] Fax number [phone]

## 2020-07-12 ENCOUNTER — Telehealth: Payer: Self-pay | Admitting: Family Medicine

## 2020-07-13 NOTE — Telephone Encounter (Signed)
Paperwork was completed and faxed on 07/05/2020.

## 2020-07-13 NOTE — Telephone Encounter (Signed)
Thank you :)

## 2020-07-18 MED FILL — SYMTUZA 800-150-200-10 MG T: 800-150-200 | 30 days supply | Qty: 30 | Fill #2

## 2020-07-20 ENCOUNTER — Other Ambulatory Visit: Payer: Self-pay

## 2020-07-20 ENCOUNTER — Other Ambulatory Visit: Payer: Self-pay | Admitting: *Deleted

## 2020-07-20 NOTE — Patient Outreach (Signed)
[facility]) Care Management  07/20/2020  Bartosz Luginbill 1956-03-15 [id number]   [facility]plex care patientfor care coordination [facility] referral Date:9/22/20for EMMI join referral Pt engagementscore 13. He is in need of several resources such as transportation, food, and education pertaining to his well being. He also states he has trouble with walking which has caused him to fall on multiple occasions. [place] access No [place] admissions nor ED visits in the last 6 months  Heis able to verify HIPAA ([place] and [place]) identifiers Reviewed and addressed the purpose of the follow up call with the patient  Consent: [facility] RN CM reviewed [facility] services with patient. Patient gave verbal consent for services   Follow up assessment  Since initial [facility] referral for resources for transportation, food, concerns with ambulation and falls and education for disease management Mr Caldron has progressed slowly  He has continued social needs.  He was assisted in 2020 to get personal care services via [place] home care.  He has been provided food and no longer reports food insecurity.  He has access to transportation with [employer] but continues to have difficulty with scheduling services related to literacy. His family minimally assists although he has brothers and children locally.  He continues to miss various medical appointments even with assistance from [place] and his personal care aide encouragement and interventions He continues to have ambulation concerns but manageable with assisted devices. He primarily remains in his home with some reported falls but no injuries requiring hospital/ED visits.  He continues to have compliance issues with home care needs related to medications and preventive care measures (eye glasses, vaccinations) Recently he requested assistance from  his primary care provider (PCP) for re certification of personal care services. He and his personal care aide, [name] has developed a good rapport. Epic MD note indicates the information was completed and [facility]ed to [facility] on 07/05/20  Recently he reported difficulty getting a [place] birth certificate. [facility] RN CM assisted him to get one sent to him  Today 07/20/20 he confirms the birth certificate was delivered this week but he had not outreached to his brother to get copies taken to the requested persons.  His brother, [name] works on Tuesdays, Thursdays and has Hemodialysis (HD) on Mondays, Wednesdays and Fridays Mr Luckman provides permission for outreach to his brother, Leonette Nutting call completed with Mr Thain to his brother, [name] [phone]  to make [name] aware that Mr Esbenshade confirms he received his birth certificate this week. [name] agrees to visit Mr Richart on next week (Monday or Tuesday) to get copies of the birth certificate to the people it needs to go to (primarily [employer] call completed with Mr Dredge to [place] [phone]  Prompt 2 a message was left indicating patient has the birth certificate, pt requests not be taken of the list and his brother will bring a copy. Requested a return call to patient or [facility] RN CM prn    Conference call completed with [name] to [place]  [phone] prompt 3 A message was left at this number also to request a return call to pt or [facility] RN CM with each's number This message refers to calling [phone] which also leads to [place] housing No message left at this number Conference call completed with Mr Viets to  [place]  [place] confirms with [facility] RN CM that Mr [place] is having memory concerns as [name] assisted Mr Marhefka this week to put his birth certificate and photo ID in an envelope  [name] confirms personal care services re certification is pending a [place]  home care nurse visit and the birth certificate is not needed  A message was left for [name] to inform him a copy of the birth certificate needs to only be taken [employer]- provided the address and number   Plans [facility] RN CM will outreach to Mr Gosch within the next 30 business days for  Pt encouraged to return a call to [facility] RN CM prn Routed note to MD Goals Addressed              This Visit's Progress     Patient Stated   .  [facility]) Find Help in My Community (pt-stated)   On track     Follow Up Date 08/24/20   - follow-up on any referrals for help I am given - make a list of family or friends that I can call    Notes: 07/20/20 received birth certificate, calls to housing authority, brother to take housing authority a copy, pending PCS RN home visit  07/06/20 collaborate with brother michael Assisted to outreach to vital check to get document sent to pt- see notes for 07/06/20 07/05/20 07/05/20 not following up Missed appts to pcp and ID MD - No showed personal care services lost, attempt to get section 8 need birth certificate 05/13/31 not progressing through [facility] care plan well  Since the last outreach he is only noted to obtain follow up appointments to his MDs He continues not complete vaccinations, not complete other preventive care interventions He is not initiating any interventions     .  [facility]) Track and Manage My Symptoms (pt-stated)   Not on track     Follow Up Date  08/24/20   - eliminate symptom triggers at home - follow asthma action plan - keep follow-up appointments - keep rescue medicines on hand       Notes: 07/20/20  has not rescheduled appointments 07/05/20 Missed appts to pcp and ID MD - No showed personal care services lost, attempt to get section 8 need birth certificate  [birth year] doing well and uses his inhaler for shortness of breath (sob) He reports his pests are controlled and he allowed pest control staff for the apartment complex to  exterminate his apartment for the month of October 2021          Ripley Lovecchio L. [name], RN, BSN, [place] Coordinator Office number [phone] Main [facility] number [phone] Fax number [phone]

## 2020-08-10 ENCOUNTER — Telehealth: Payer: Self-pay | Admitting: Clinical

## 2020-08-10 NOTE — Telephone Encounter (Signed)
[facility] General Follow Up Note  08/10/2020 Name: Jossue Rubenstein MRN: [id number] DOB: 08/14/1955 Logon Uttech is a 65 y.o. year old male who sees Azzie Glatter, FNP for primary care. LCSW was consulted to assist with transportation needs.  Interpreter: No.   Interpreter Name & Language: none  Assessment: Patient experiencing transportation barriers. Patient has an appointment with his PCP for 08/17/20 and needs a ride to this appointment.   Ongoing Intervention: Patient is already enrolled in [employer]. Today CSW scheduled ride for patient to attend the appointment on 1/14.   Review of patient status, including review of consultants reports, relevant laboratory and other test results, and collaboration with appropriate care team members and the patient's provider was performed as part of comprehensive patient evaluation and provision of services.     Follow up Plan: 1. CSW available from clinic as needed.  Estanislado Emms, [place] [facility] [phone]

## 2020-08-15 ENCOUNTER — Other Ambulatory Visit: Payer: Self-pay

## 2020-08-15 ENCOUNTER — Telehealth: Payer: Self-pay

## 2020-08-15 ENCOUNTER — Encounter: Payer: Self-pay | Admitting: Internal Medicine

## 2020-08-15 ENCOUNTER — Ambulatory Visit: Payer: Medicare Other | Admitting: Family Medicine

## 2020-08-15 ENCOUNTER — Ambulatory Visit (INDEPENDENT_AMBULATORY_CARE_PROVIDER_SITE_OTHER): Payer: Medicare Other | Admitting: Internal Medicine

## 2020-08-15 VITALS — BP 160/81 | HR 65 | Temp 97.3°F | Resp 16 | Ht 64.0 in | Wt 118.0 lb

## 2020-08-15 DIAGNOSIS — R634 Abnormal weight loss: Secondary | ICD-10-CM

## 2020-08-15 DIAGNOSIS — B20 Human immunodeficiency virus [HIV] disease: Secondary | ICD-10-CM

## 2020-08-15 DIAGNOSIS — Z23 Encounter for immunization: Secondary | ICD-10-CM

## 2020-08-15 MED ORDER — ENSURE PO LIQD: ORAL | 12 refills | Status: DC

## 2020-08-15 MED ORDER — ENSURE PO LIQD: 237.0000 mL | Freq: Two times a day (BID) | ORAL | 11 refills | Status: DC

## 2020-08-15 MED FILL — SYMTUZA 800-150-200-10 MG T: 800-150-200 | 30 days supply | Qty: 30 | Fill #3

## 2020-08-15 NOTE — Telephone Encounter (Signed)
[name] Patient Advocate Encounter   I was successful in securing patient a $7500 grant from [employer] (PAF) to provide copayment coverage for [employer].  This will make the out of pocket cost $0.00.     I have spoken with the patient.    The billing information is as follows and has been shared with [place].            Dates of Eligibility: 08/15/20 through 08/15/21  Patient knows to call the office with questions or concerns.  Ileene Patrick, [facility] [facility] Patient [facility] Phone: [phone] Fax:  [phone]

## 2020-08-15 NOTE — Progress Notes (Signed)
[name] for Infectious Disease  Patient Active Problem List   Diagnosis Date Noted  . HIV disease ([place]) 04/18/2015    Priority: High  . Dysuria 05/29/2015    Priority: Medium  . Depression 04/19/2015    Priority: Medium  . 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 Malignant neoplasm of bladder ([place]) 03/30/2015    Priority: Medium  . Frequent falls 07/21/2019  . Abdominal pain 02/19/2017  . Exacerbation of asthma 09/09/2016  . Acute respiratory failure with hypoxia ([place]) 09/09/2016  . Unintentional weight loss 07/01/2016  . Anorexia 03/11/2016  . Syncope 10/31/2015  . HTN (hypertension) 03/30/2015  . Asthma, chronic 03/30/2015    Patient's Medications  New Prescriptions   No medications on file  Previous Medications   AMLODIPINE (NORVASC) 5 MG TABLET    TAKE 1 TABLET (5 MG TOTAL) BY MOUTH DAILY.   ENSURE (ENSURE)    Take 237 mLs by mouth 2 (two) times daily between meals. Decreased Nutritional intake due to Bladder Cancer treatment and uncontrolled HIV   IPRATROPIUM-ALBUTEROL (DUONEB) 0.5-2.5 (3) MG/3ML SOLN    Take 3 mLs by nebulization every 6 (six) hours as needed (wheezing and chest tightness).   SYMTUZA 800-150-200-10 MG TABS    TAKE 1 TABLET BY MOUTH DAILY WITH BREAKFAST.   VENTOLIN HFA 108 (90 BASE) MCG/ACT INHALER    INHALE 2 PUFFS BY MOUTH INTO THE LUNGS EVERY 6 HOURS AS NEEDED FOR WHEEZING OR SHORTNESS OF BREATH  Modified Medications   No medications on file  Discontinued Medications   CEPHALEXIN (KEFLEX) 500 MG CAPSULE    TAKE 1 CAPSULE BY MOUTH THREE TIMES A DAY   CETIRIZINE (ZYRTEC) 10 MG TABLET    Take 1 tablet (10 mg total) by mouth at bedtime.   DOXYCYCLINE (VIBRA-TABS) 100 MG TABLET    Take 100 mg by mouth 2 (two) times daily.   MEGESTROL (MEGACE) 400 MG/10ML SUSPENSION    Take 10 mLs (400 mg total) by mouth daily.   ONDANSETRON (ZOFRAN) 4 MG TABLET    Take 1 tablet (4 mg total) by mouth every 8 (eight) hours as  needed for nausea or vomiting.   PHENAZOPYRIDINE (PYRIDIUM) 200 MG TABLET    Take 200 mg by mouth 3 (three) times daily.   SULFAMETHOXAZOLE-TRIMETHOPRIM (BACTRIM DS) 800-160 MG TABLET    Take 1 tablet by mouth 2 (two) times daily.   VITAMIN D, ERGOCALCIFEROL, (DRISDOL) 1.25 MG (50000 UT) CAPS CAPSULE    Take 1 capsule (50,000 Units total) by mouth every 7 (seven) days.    Subjective: [name] is in for his routine HIV follow-up visit.  He denies any problems obtaining his Symtuza.  It is being mailed to him from [employer].  Records show that he has been receiving it each month up until this month when he did not answer a phone call 2 days ago.  He says that he takes Symtuza each evening with his amlodipine.  He has not using a pillbox.  He says he did not take his Symtuza 1 day last week when he had some upset stomach.  He says that he received 1 COVID-vaccine at [facility].  He recalls being told that he needed to come back for a second dose but says that he has not had transportation.  However, he was able to arrange transportation here today.  Review of Systems: Review of Systems  Constitutional: Negative for fever and  weight loss.  Psychiatric/Behavioral: Negative for depression. The patient is not nervous/anxious.     Past Medical History:  Diagnosis Date  . Anorexia   . Asthma with COPD ([place])   . Bladder cancer (H[facility])    and prostatic urethra cancer  HX TURBT'S IN [place], [place]  WITH INSTILLATION CHEMO TX'S  . Bladder tumor   . History of transfusion   . HIV disease ([place])    under care of [facility]  . Hypertension   . Nocturia   . Vitamin D deficiency     Social History   Tobacco Use  . Smoking status: Current Every Day Smoker    Packs/day: 0.10    Years: 17.00    Pack years: 1.70    Types: Cigarettes  . Smokeless tobacco: Never Used  . Tobacco comment: 3/day;  Vaping Use  . Vaping Use: Never used  Substance Use Topics  . Alcohol  use: Yes    Alcohol/week: 6.0 standard drinks    Types: 6 Cans of beer per week    Comment: occasional  . Drug use: Yes    Types: Marijuana    Comment: occassional    Family History  Problem Relation Age of Onset  . Cancer Mother        Gastric Cancer  . Hypertension Mother   . Heart disease Mother   . Cancer Maternal Grandmother        breast cancer     Allergies  Allergen Reactions  . Aspirin Shortness Of Breath, Nausea And Vomiting and Swelling    Objective: Vitals:   08/15/20 1040  BP: (!) 160/81  Pulse: 65  Resp: 16  Temp: (!) 97.3 F (36.3 C)  TempSrc: Oral  SpO2: 99%  Weight: 118 lb (53.5 kg)  Height: 5\' 4"  (1.626 m)   Body mass index is 20.25 kg/m.  Physical Exam Constitutional:      Comments: He is calm and in no distress.  Cardiovascular:     Rate and Rhythm: Normal rate and regular rhythm.     Heart sounds: No murmur heard.   Pulmonary:     Effort: Pulmonary effort is normal.     Breath sounds: Normal breath sounds.  Psychiatric:        Mood and Affect: Mood normal.     Lab Results    Problem List Items Addressed This Visit      High   HIV disease ([place])    Concerned about continuing poor adherence with his Symtuza given his viral activation over the past year.  I asked him to do his best to not miss a single dose between now and his follow-up visit in 2 weeks.  I showed him pictures of Symtuza.  I asked him to bring all of his medication bottles in with him before his next visit.  He received his influenza vaccine here today.      Relevant Orders   T-helper cell (CD4)- ([name] clinic only)   HIV-1 RNA quant-no reflex-bld       Michel Bickers, MD [facility] [place] [facility] [phone] pager   [phone] cell 08/15/2020, 11:04 AM

## 2020-08-15 NOTE — Assessment & Plan Note (Signed)
Concerned about continuing poor adherence with his Symtuza given his viral activation over the past year.  I asked him to do his best to not miss a single dose between now and his follow-up visit in 2 weeks.  I showed him pictures of Symtuza.  I asked him to bring all of his medication bottles in with him before his next visit.  He received his influenza vaccine here today.

## 2020-08-16 LAB — T-HELPER CELL (CD4) - (RCID CLINIC ONLY)
CD4 % Helper T Cell: 45 % (ref 33–65)
CD4 T Cell Abs: 442 /uL (ref 400–1790)

## 2020-08-17 ENCOUNTER — Telehealth (INDEPENDENT_AMBULATORY_CARE_PROVIDER_SITE_OTHER): Payer: Medicare Other | Admitting: Family Medicine

## 2020-08-17 DIAGNOSIS — Z09 Encounter for follow-up examination after completed treatment for conditions other than malignant neoplasm: Secondary | ICD-10-CM

## 2020-08-17 DIAGNOSIS — J4489 Other specified chronic obstructive pulmonary disease: Secondary | ICD-10-CM

## 2020-08-17 DIAGNOSIS — J449 Chronic obstructive pulmonary disease, unspecified: Secondary | ICD-10-CM

## 2020-08-17 DIAGNOSIS — B20 Human immunodeficiency virus [HIV] disease: Secondary | ICD-10-CM

## 2020-08-17 DIAGNOSIS — I10 Essential (primary) hypertension: Secondary | ICD-10-CM

## 2020-08-17 NOTE — Progress Notes (Signed)
Virtual Visit via Telephone Note  I connected with [name] on 08/18/20 at  3:00 PM EST by telephone and verified that I am speaking with the correct person using two identifiers.  Location: Patient: Home Provider: Home   I discussed the limitations, risks, security and privacy concerns of performing an evaluation and management service by telephone and the availability of in person appointments. I also discussed with the patient that there may be a patient responsible charge related to this service. The patient expressed understanding and agreed to proceed.   History of Present Illness:  Patient Active Problem List   Diagnosis Date Noted  . Frequent falls 07/21/2019  . Abdominal pain 02/19/2017  . Exacerbation of asthma 09/09/2016  . Acute respiratory failure with hypoxia ([place]) 09/09/2016  . Unintentional weight loss 07/01/2016  . Anorexia 03/11/2016  . Syncope 10/31/2015  . Dysuria 05/29/2015  . Depression 04/19/2015  . HIV disease ([place]) 04/18/2015  . 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 HTN (hypertension) 03/30/2015  . Asthma, chronic 03/30/2015  . Malignant neoplasm of bladder ([place]) 03/30/2015   Current Status: Since his last office visit, he is doing well with no complaints. He is requesting that paperwork to be completed. He denies fevers, chills, fatigue, recent infections, weight loss, and night sweats. He has not had any headaches, visual changes, dizziness, and falls. No chest pain, heart palpitations, cough and shortness of breath reported. Denies GI problems such as nausea, vomiting, diarrhea, and constipation. He has no reports of blood in stools, dysuria and hematuria. No depression or anxiety reported today. He is taking all medications as prescribed. He continues to follow up with Infection Disease as needed. He denies pain today.     Observations/Objective:  Telephone Virtual Visit   Assessment and Plan:  1. COPD with asthma ([place]) Stable. No  signs or symptoms of respiratory distress noted or reported.   2. Essential hypertension He will continue to take medications as prescribed, to decrease high sodium intake, excessive alcohol intake, increase potassium intake, smoking cessation, and increase physical activity of at least 30 minutes of cardio activity daily. He will continue to follow Heart Healthy or DASH diet.  3. HIV disease ([place]) Continue to follow up with Infection Disease as needed.   4. Follow up He will follow up in 6 months for Annual Physical.  No orders of the defined types were placed in this encounter.   No orders of the defined types were placed in this encounter.   Referral Orders  No referral(s) requested today    Kathe Becton, MSN, ANE, FNP-BC [facility]/Internal [place] [address] [place], [place] [address] [phone] fax   I discussed the assessment and treatment plan with the patient. The patient was provided an opportunity to ask questions and all were answered. The patient agreed with the plan and demonstrated an understanding of the instructions.   The patient was advised to call back or seek an in-person evaluation if the symptoms worsen or if the condition fails to improve as anticipated.  I provided minutes 20 of non-face-to-face time during this encounter.   Azzie Glatter, FNP

## 2020-08-18 ENCOUNTER — Encounter: Payer: Self-pay | Admitting: Family Medicine

## 2020-08-20 LAB — HIV-1 RNA QUANT-NO REFLEX-BLD
HIV 1 RNA Quant: 20500 Copies/mL — ABNORMAL HIGH
HIV-1 RNA Quant, Log: 4.31 Log cps/mL — ABNORMAL HIGH

## 2020-08-22 ENCOUNTER — Other Ambulatory Visit: Payer: Self-pay | Admitting: *Deleted

## 2020-08-22 DIAGNOSIS — B20 Human immunodeficiency virus [HIV] disease: Secondary | ICD-10-CM

## 2020-08-22 DIAGNOSIS — R630 Anorexia: Secondary | ICD-10-CM

## 2020-08-22 DIAGNOSIS — R634 Abnormal weight loss: Secondary | ICD-10-CM

## 2020-08-22 MED ORDER — ENSURE PO LIQD: 1.0000 | Freq: Two times a day (BID) | ORAL | 12 refills | Status: DC

## 2020-08-22 NOTE — Progress Notes (Signed)
Ensure printed for THP Landis Gandy, RN

## 2020-08-24 ENCOUNTER — Other Ambulatory Visit: Payer: Self-pay

## 2020-08-24 ENCOUNTER — Encounter: Payer: Self-pay | Admitting: Family Medicine

## 2020-08-24 ENCOUNTER — Other Ambulatory Visit: Payer: Self-pay | Admitting: *Deleted

## 2020-08-24 NOTE — Patient Outreach (Addendum)
[facility]) Care Management  08/24/2020  Sava Proby 11/27/1955 [id number]   [facility] care patientforcare coordination [facility] referral Date:9/22/20for EMMI join referral Pt engagementscore 13. He is in need of several resources such as transportation, food, and education pertaining to his well being. He also states he has trouble with walking which has caused him to fall on multiple occasions. [place] access No [facility] admissions nor ED visits in the last 6 months  Heis able to verify HIPAA ([place] and [place]) identifiers Reviewed and addressed the purpose of the follow up call with the patient  Consent: [facility]) RN CM reviewed [facility] services with patient. Patient gave verbal consent for services   Patient is able to verify HIPAA ([place] and Accountability Act) identifiers Reviewed and addressed the purpose of the follow up call with the patient  Consent: [facility] RN CM reviewed [facility] services with patient. Patient gave verbal consent for services.  Follow up assessment Mr Cornforth denies any medical complaints He confirms he has electricity and food Mr Winegar reports with follow up that he has attended medical appointments but is it noted in Epic notes that he had assist with getting to [name] and had a virtual visit with his pcp, Clovis Riley. Dr Hale Bogus note indicates he is still not bringing in his medications and not taking medications as ordered He was encouraged to take medications as ordered He has been assisted by the Infectious disease provider staff to get assistance with symtuza He discusses in this outreach that he does not believe his birth certificate after it was received in the mail was taken to the required vendor. He believes it is related to "section eight"  [facility] RN CM is not familiar with  where the birth certificate needed to be taken as his brother [name] stated in December 2021 that he would complete this task for pt. Mr Cardinal reports today that his brother [name] became sick.  Left a message for [facility] caregiver [phone]  No answer. [name] left HIPAA HIPAA [facility] Portability and Accountability Act) compliant voicemail message along with CM's contact info.   Plans [facility] RN CM will outreach to Mr Feutz within the next 30 business days for  Pt encouraged to return a call to [facility] RN CM prn Routed note to MD Goals      Patient Stated   .  [facility]) Find Help in My Community (pt-stated)      Follow Up Date 09/24/20   - follow-up on any referrals for help I am given - make a list of family or friends that I can call     Notes: 08/24/20 continue to get assist from ID MD office, reports birth certificate may not have been provided to housing authority by caregiver/brother  07/20/20 received birth certificate, calls to housing authority, brother to take housing authority a copy, pending PCS RN home visit  07/06/20 collaborate with brother michael Assisted to outreach to vital check to get document sent to pt- see notes for 07/06/20 07/05/20 07/05/20 not following up Missed appts to pcp and ID MD - No showed personal care services lost, attempt to get section 8 need birth certificate [birth year]/10/[birth year] not progressing through [facility] care plan well  Since the last outreach he is only noted to obtain follow up appointments to his MDs He continues not complete vaccinations, not complete other preventive care interventions He is  not initiating any interventions     .  (THN) Prevent Falls and Injury (pt-stated)      Follow Up Date  09/24/20   - always wear low-heeled or flat shoes or slippers with nonskid soles - keep my cell phone with me always - use a cane or walker         Notes: 08/24/20 no falls reported    .  [facility]) Track and Manage My Blood Pressure-Hypertension (pt-stated)       Timeframe:  Short-Term Goal Priority:  Medium Start Date:                   08/24/20          Expected End Date:           11/01/20            Follow Up Date 09/24/20  - choose a place to take my blood pressure (home, clinic or office, retail store)       Notes:     .  [facility]) Track and Manage My Symptoms (pt-stated)      Follow Up Date  09/07/20   - eliminate symptom triggers at home - follow asthma action plan - keep follow-up appointments - keep rescue medicines on hand       Notes: 08/24/20 had ID & PCP appts Not following tx plan 07/20/20  has not rescheduled appointments 07/05/20 Missed appts to pcp and ID MD - No showed personal care services lost, attempt to get section 8 need birth certificate  60/6/30 doing well and uses his inhaler for shortness of breath (sob) He reports his pests are controlled and he allowed pest control staff for the apartment complex to exterminate his apartment for the month of October 2021        Nyjah Denio L. [name], RN, BSN, [place] Coordinator Office number [phone] Main [facility] number [phone] Fax number [phone]

## 2020-08-29 ENCOUNTER — Ambulatory Visit: Payer: Medicare Other | Admitting: Internal Medicine

## 2020-09-04 ENCOUNTER — Ambulatory Visit: Payer: Medicare Other | Admitting: Internal Medicine

## 2020-09-05 ENCOUNTER — Other Ambulatory Visit: Payer: Self-pay | Admitting: *Deleted

## 2020-09-05 DIAGNOSIS — J45909 Unspecified asthma, uncomplicated: Secondary | ICD-10-CM

## 2020-09-05 MED ORDER — ALBUTEROL SULFATE HFA 108 (90 BASE) MCG/ACT IN AERS: INHALATION_SPRAY | RESPIRATORY_TRACT | 5 refills | Status: DC

## 2020-09-05 MED FILL — ALBUTEROL SULFATE HFA 108 (: 108 (90 BAS | 25 days supply | Qty: 18 | Fill #0

## 2020-09-06 ENCOUNTER — Ambulatory Visit: Payer: Medicare Other | Admitting: Internal Medicine

## 2020-09-11 ENCOUNTER — Other Ambulatory Visit: Payer: Self-pay

## 2020-09-11 ENCOUNTER — Telehealth (INDEPENDENT_AMBULATORY_CARE_PROVIDER_SITE_OTHER): Payer: Medicare Other | Admitting: Internal Medicine

## 2020-09-11 ENCOUNTER — Encounter: Payer: Self-pay | Admitting: Internal Medicine

## 2020-09-11 DIAGNOSIS — B20 Human immunodeficiency virus [HIV] disease: Secondary | ICD-10-CM

## 2020-09-11 NOTE — Progress Notes (Signed)
Virtual Visit via Telephone Note  I connected with [name] on 09/11/20 at  3:30 PM EST by telephone and verified that I am speaking with the correct person using two identifiers.  Location: Patient: Home Provider: [name]   I discussed the limitations, risks, security and privacy concerns of performing an evaluation and management service by telephone and the availability of in person appointments. I also discussed with the patient that there may be a patient responsible charge related to this service. The patient expressed understanding and agreed to proceed.   History of Present Illness: I called and spoke with [name] today.  He says that since his last visit here he has not missed any doses of his Symtuza.  He says that he is now using his pillbox and puts his Symtuza and blood pressure medication in the pillbox.  His pharmacy has been good about mailing it to him every month.   Observations/Objective: HIV 1 RNA Quant  Date Value  08/15/2020 20,500 Copies/mL (H)  03/07/2020 10,000 Copies/mL (H)  01/19/2020 5,980 copies/mL (H)   CD4 T Cell Abs (/uL)  Date Value  08/15/2020 442  03/07/2020 435  01/19/2020 458    Assessment and Plan: [name] has not had a good viral suppression for over 1 year.  It does sound like his adherence has improved recently.  I encouraged him to not miss any doses.  He will follow-up here in 6 weeks for repeat blood work and we will arrange a phone visit 2 weeks after that.  Follow Up Instructions: Continue Symtuza Repeat blood work in late March and follow-up by phone 2 weeks later   I discussed the assessment and treatment plan with the patient. The patient was provided an opportunity to ask questions and all were answered. The patient agreed with the plan and demonstrated an understanding of the instructions.   The patient was advised to call back or seek an in-person evaluation if the symptoms worsen or if the condition fails to improve as  anticipated.  I provided 18 minutes of non-face-to-face time during this encounter.   Michel Bickers, MD

## 2020-09-13 MED FILL — SYMTUZA 800-150-200-10 MG T: 800-150-200 | 30 days supply | Qty: 30 | Fill #4

## 2020-09-24 ENCOUNTER — Other Ambulatory Visit: Payer: Self-pay | Admitting: *Deleted

## 2020-09-24 ENCOUNTER — Encounter: Payer: Self-pay | Admitting: *Deleted

## 2020-09-24 ENCOUNTER — Other Ambulatory Visit: Payer: Self-pay

## 2020-09-24 NOTE — Patient Outreach (Addendum)
[facility]) Care Management  09/24/2020  [name] 07-10-1956 [id number]   [facility]N outreachand case closure for complex care patientforcare coordination [facility] referral Date:9/22/20for EMMI join referral Pt engagementscore 13. He is in need of several resources such as transportation, food, and education pertaining to his well being. He also states he has trouble with walking which has caused him to fall on multiple occasions. [place] access No [place] admissions nor ED visits in the last 6 months     Follow up assessment [name] RN CM outreached successfully to [name], pt personal care aide [name] confirms his personal care services have been re started He returned to the home "a week ago" [name] reports [name] is eating and taking ensure. He reports [name] has received telephonic outreaches from his medical providers that are more beneficial for [name] as it is difficult for [name] to leave his home . He confirms he placed [name] birth certificate and ID cards in a safe place in [name] apartment. He is not able to confirm where the birth certificate needed to be taken. He states that the [employer] facility may have requested it and he reports he will follow up on this.   Outreach to [name]  Patient is able to verify HIPAA ([place] and Accountability Act) identifiers Reviewed and addressed the purpose of the follow up call with the patient  Consent: [name] ([place]) RN CM reviewed [name] services with patient. Patient gave verbal consent for services.  [name] confirms he if able to locate his birth certificate and IDs and that his services with [name] has restarted [name] reports he was seen by a home visiting nurse "last week" Nurse (Personal care services [facility]) nurse) visits about every 2 months per [name] [name] confirms his telephonic outreaches from his pcp and [name] He and [facility] RN CM discussed his options of MD visits He would like to speak with his providers about changing to telephonic MD visits He was encouraged to drink his ensure, take his medicines as ordered, get his second covid shot, get his eye glasses and outreach to [name] for placement He voiced understanding  [name] discussed [facility] case closure and pt with pending interventions for 3+ months He agrees with case closure "I understand" and confirms to allow personal care staff and home visiting nurse to continue to help him with pending services/interventions     Plans [employer] case closure  Pt encouraged to return a call to [name] RN CM prn Case closure letters sent to pt and pcp Goals Addressed              This Visit's Progress     Patient Stated   .  COMPLETED: [name]) Find Help in My Community (pt-stated)   On track     Case closure 09/24/20 Pt continues with pending interventions with minimal progression of goals; telephonic MD outreaches, personal care services re established and pt reports home visits from nurses being completed,    - follow-up on any referrals for help I am given - make a list of family or friends that I can call     Notes: 08/24/20 continue to get assist from ID MD office, reports birth certificate may not have been provided to housing authority by caregiver/brother  07/20/20 received birth certificate, calls to housing authority, brother to take housing authority a copy, pending PCS  RN home visit  07/06/20 collaborate with brother [name] Assisted to outreach to vital check to get document sent to pt- see notes for 07/06/20 07/05/20 07/05/20 not following up Missed appts to pcp and ID MD - No showed personal care services lost, attempt to get section 8 need birth certificate 89/3/81 not progressing through [name] care plan well  Since the last outreach he is only noted to obtain follow up appointments to his MDs He continues not  complete vaccinations, not complete other preventive care interventions He is not initiating any interventions     .  COMPLETED: ([facility]N) Prevent Falls and Injury (pt-stated)   On track     Denies falls Case closure 09/24/20 Pt continues with pending interventions with minimal progression of goals; telephonic MD outreaches, personal care services re established and pt reports home visits from nurses being completed   - always wear low-heeled or flat shoes or slippers with nonskid soles - keep my cell phone with me always - use a cane or walker     Notes: 08/24/20 no falls reported    .  COMPLETED: [name]) Track and Manage My Blood Pressure-Hypertension (pt-stated)   On track     Timeframe:  Short-Term Goal Priority:  Medium Start Date:                   08/24/20          Expected End Date:           11/01/20            Pt not checking BP at home  Case closure 09/24/20 Pt continues with pending interventions with minimal progression of goals; telephonic MD outreaches, personal care services re established and pt reports home visits from nurses being completed - choose a place to take my blood pressure (home, clinic or office, retail store)        Notes:     .  COMPLETED: [facility]) Track and Manage My Symptoms (pt-stated)   Not on track     Case closure 09/24/20 Pt continues with pending interventions with minimal progression of goals; telephonic MD outreaches, personal care services re established and pt reports home visits from nurses being completed Pt allowing pest control for apartment complex to complete scheduled visits for bugs Intake of medications varies   - eliminate symptom triggers at home - follow asthma action plan - keep follow-up appointments - keep rescue medicines on hand       Notes: 08/24/20 had ID & PCP appts Not following tx plan 07/20/20  has not rescheduled appointments 07/05/20 Missed appts to pcp and ID MD - No showed personal care services lost, attempt to get section  8 need birth certificate  [birth year] doing well and uses his inhaler for shortness of breath (sob) He reports his pests are controlled and he allowed pest control staff for the apartment complex to exterminate his apartment for the month of October 2021         [place]. [name], RN, BSN, [place] Coordinator Office number [phone] Main [name] number [phone] Fax number [phone]

## 2020-10-23 ENCOUNTER — Telehealth: Payer: Self-pay

## 2020-10-23 ENCOUNTER — Other Ambulatory Visit: Payer: Medicare Other

## 2020-10-23 NOTE — Telephone Encounter (Signed)
[name] Patient Advocate Encounter  [facility] and I have been unsuccsessful in reaching patient to be able to refill medication.    We have tried multiple times without a response.  Coletta Lockner, CPhT Specialty Pharmacy Patient Advocate [facility] Phone: [phone] Fax:  [phone]  

## 2020-10-26 ENCOUNTER — Other Ambulatory Visit (HOSPITAL_COMMUNITY): Payer: Self-pay

## 2020-11-06 ENCOUNTER — Telehealth: Payer: Medicare Other | Admitting: Internal Medicine

## 2020-11-07 ENCOUNTER — Other Ambulatory Visit: Payer: 59

## 2020-11-21 ENCOUNTER — Other Ambulatory Visit: Payer: Self-pay | Admitting: Internal Medicine

## 2020-11-21 ENCOUNTER — Other Ambulatory Visit (HOSPITAL_COMMUNITY): Payer: Self-pay

## 2020-11-21 DIAGNOSIS — B20 Human immunodeficiency virus [HIV] disease: Secondary | ICD-10-CM

## 2020-11-21 MED FILL — Albuterol Sulfate Inhal Aero 108 MCG/ACT (90MCG Base Equiv): RESPIRATORY_TRACT | 28 days supply | Qty: 18 | Fill #0 | Status: AC

## 2020-11-22 ENCOUNTER — Other Ambulatory Visit (HOSPITAL_COMMUNITY): Payer: Self-pay

## 2020-11-22 MED ORDER — SYMTUZA 800-150-200-10 MG PO TABS
1.0000 | ORAL_TABLET | Freq: Every day | ORAL | 0 refills | Status: DC
  Filled 2020-11-22: qty 30, 30d supply, fill #0

## 2020-11-27 ENCOUNTER — Encounter: Payer: Self-pay | Admitting: Internal Medicine

## 2020-11-27 ENCOUNTER — Ambulatory Visit (INDEPENDENT_AMBULATORY_CARE_PROVIDER_SITE_OTHER): Payer: 59 | Admitting: Internal Medicine

## 2020-11-27 DIAGNOSIS — C679 Malignant neoplasm of bladder, unspecified: Secondary | ICD-10-CM | POA: Diagnosis not present

## 2020-11-27 DIAGNOSIS — B20 Human immunodeficiency virus [HIV] disease: Secondary | ICD-10-CM

## 2020-11-27 DIAGNOSIS — F3342 Major depressive disorder, recurrent, in full remission: Secondary | ICD-10-CM | POA: Diagnosis not present

## 2020-11-27 DIAGNOSIS — F17210 Nicotine dependence, cigarettes, uncomplicated: Secondary | ICD-10-CM | POA: Diagnosis not present

## 2020-11-27 NOTE — Assessment & Plan Note (Signed)
His depression is in remission. 

## 2020-11-27 NOTE — Assessment & Plan Note (Signed)
I encouraged him to continue to cut down and attempt to quit.

## 2020-11-27 NOTE — Progress Notes (Signed)
Patient Active Problem List   Diagnosis Date Noted  . HIV disease ([place]) 04/18/2015    Priority: High  . Dysuria 05/29/2015    Priority: Medium  . Depression 04/19/2015    Priority: Medium  . 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 Malignant neoplasm of bladder ([place]) 03/30/2015    Priority: Medium  . Cigarette smoker 11/27/2020  . Frequent falls 07/21/2019  . Abdominal pain 02/19/2017  . Exacerbation of asthma 09/09/2016  . Acute respiratory failure with hypoxia ([place]) 09/09/2016  . Unintentional weight loss 07/01/2016  . Anorexia 03/11/2016  . Syncope 10/31/2015  . HTN (hypertension) 03/30/2015  . Asthma, chronic 03/30/2015    Patient's Medications  New Prescriptions   No medications on file  Previous Medications   ALBUTEROL (VENTOLIN HFA) 108 (90 BASE) MCG/ACT INHALER    INHALE 2 PUFFS BY MOUTH INTO THE LUNGS EVERY 6 HOURS AS NEEDED FOR WHEEZING OR SHORTNESS OF BREATH   AMLODIPINE (NORVASC) 5 MG TABLET    TAKE 1 TABLET (5 MG TOTAL) BY MOUTH DAILY.   DARUNAVIR-COBICISCTAT-EMTRICITABINE-TENOFOVIR ALAFENAMIDE (SYMTUZA) 800-150-200-10 MG TABS    TAKE 1 TABLET BY MOUTH DAILY WITH BREAKFAST.   ENSURE (ENSURE)    Take 1 Can by mouth 2 (two) times daily between meals.   IPRATROPIUM-ALBUTEROL (DUONEB) 0.5-2.5 (3) MG/3ML SOLN    Take 3 mLs by nebulization every 6 (six) hours as needed (wheezing and chest tightness).  Modified Medications   No medications on file  Discontinued Medications   No medications on file    Subjective: [name] is in for his routine HIV follow-up visit.  He denies any problems obtaining, taking or tolerating his Symtuza.  He can point out the proper picture of Symtuza.  He is still using his pillbox.  He takes Symtuza each evening.  He says that he has missed Symtuza 2-3 times in the past month when he had some nausea and vomiting right after taking it.  He has started taking some Pepto-Bismol with good relief of his  nausea.  He has had some problems with intermittent dysuria and a little bit of hematuria.  He thinks he saw Dr. Louis Meckel, his urologist about 5 months ago.  He does not have a follow-up visit scheduled yet.  He is down to 5 to 6 cigarettes daily.  He hopes to quit.  He is little worried about his recent weight loss.  He denies feeling anxious or depressed.  Review of Systems: Review of Systems  Constitutional: Positive for weight loss. Negative for chills, diaphoresis, fever and malaise/fatigue.  HENT: Negative for sore throat.   Respiratory: Positive for cough. Negative for sputum production and shortness of breath.   Cardiovascular: Negative for chest pain.  Gastrointestinal: Positive for nausea and vomiting. Negative for abdominal pain, diarrhea and heartburn.  Genitourinary: Positive for dysuria and hematuria. Negative for frequency.  Musculoskeletal: Negative for joint pain and myalgias.  Skin: Negative for rash.  Psychiatric/Behavioral: Negative for depression. The patient is not nervous/anxious.     Past Medical History:  Diagnosis Date  . Anorexia   . Asthma with COPD ([place])   . Bladder cancer ([facility])    and prostatic urethra cancer  HX TURBT'S IN [place]  WITH INSTILLATION CHEMO TX'S  . Bladder tumor   . History of transfusion   . HIV disease ([place])    under care of [facility]  . Hypertension   .  Nocturia   . Vitamin D deficiency     Social History   Tobacco Use  . Smoking status: Current Every Day Smoker    Packs/day: 0.75    Years: 17.00    Pack years: 12.75    Types: Cigarettes  . Smokeless tobacco: Never Used  . Tobacco comment: 3/day;  Vaping Use  . Vaping Use: Never used  Substance Use Topics  . Alcohol use: Yes    Alcohol/week: 6.0 standard drinks    Types: 6 Cans of beer per week    Comment: occasional beer  . Drug use: Not Currently    Types: Marijuana    Comment: occassional    Family History  Problem  Relation Age of Onset  . Cancer Mother        Gastric Cancer  . Hypertension Mother   . Heart disease Mother   . Cancer Maternal Grandmother        breast cancer     Allergies  Allergen Reactions  . Aspirin Shortness Of Breath, Nausea And Vomiting and Swelling    Health Maintenance  Topic Date Due  . COVID-19 Vaccine (1) Never done  . COLONOSCOPY (Pts 45-7yrs Insurance coverage will need to be confirmed)  Never done  . PNA vac Low Risk Adult (1 of 2 - PCV13) Never done  . TETANUS/TDAP  04/02/2027 (Originally 10/04/1974)  . INFLUENZA VACCINE  03/04/2021  . Hepatitis C Screening  Completed  . HPV VACCINES  Aged Out    Objective:  Vitals:   11/27/20 1401  BP: (!) 154/81  Pulse: 97  Temp: 98 F (36.7 C)  TempSrc: Oral  SpO2: 99%  Weight: 110 lb (49.9 kg)  Height: 5\' 4"  (1.626 m)   Body mass index is 18.88 kg/m.  Physical Exam Constitutional:      Comments: He is calm and pleasant as usual.  Cardiovascular:     Rate and Rhythm: Normal rate and regular rhythm.     Heart sounds: No murmur heard.   Pulmonary:     Effort: Pulmonary effort is normal.     Breath sounds: Normal breath sounds.     Comments: Distant breath sounds. Abdominal:     Palpations: Abdomen is soft.     Tenderness: There is no abdominal tenderness.  Musculoskeletal:        General: No tenderness.  Skin:    General: Skin is dry.     Findings: No rash.  Neurological:     General: No focal deficit present.     Comments: He walks slowly with the aid of his cane.  Psychiatric:        Mood and Affect: Mood normal.     Lab Results Lab Results  Component Value Date   WBC 3.0 (L) 01/19/2020   HGB 12.6 (L) 01/19/2020   HCT 36.7 (L) 01/19/2020   MCV 100.8 (H) 01/19/2020   PLT 191 01/19/2020    Lab Results  Component Value Date   CREATININE 0.82 01/19/2020   BUN 11 01/19/2020   NA 121 (L) 01/19/2020   K 4.6 01/19/2020   CL 93 (L) 01/19/2020   CO2 20 01/19/2020    Lab Results   Component Value Date   ALT 26 01/19/2020   AST 71 (H) 01/19/2020   ALKPHOS 157 (H) 05/11/2019   BILITOT 0.4 01/19/2020    Lab Results  Component Value Date   CHOL 99 (L) 05/11/2019   HDL 71 05/11/2019   LDLCALC 17 05/11/2019  TRIG 40 05/11/2019   CHOLHDL 1.4 05/11/2019   Lab Results  Component Value Date   LABRPR NON-REACTIVE 01/19/2020   HIV 1 RNA Quant  Date Value  08/15/2020 20,500 Copies/mL (H)  03/07/2020 10,000 Copies/mL (H)  01/19/2020 5,980 copies/mL (H)   CD4 T Cell Abs (/uL)  Date Value  08/15/2020 442  03/07/2020 435  01/19/2020 458     Problem List Items Addressed This Visit      High   HIV disease ([place])    His CD4 count remains normal but he has not had good viral suppression for several years.  I told him that the only reasons that his viral load would be elevated would be 1.  He has missing doses frequently or 2.  His virus has developed resistance to [employer].  He told me he thinks that it has been because he had been missing doses frequently.  He feels like he is doing better since he started using a pillbox several months ago.  He will get repeat lab work today and follow-up in 1 month.      Relevant Orders   T-helper cell (CD4)- ([name] clinic only)   HIV-1 RNA quant-no reflex-bld   HIV-1 genotypr plus   HIV-1 Integrase Genotype     Medium   Malignant neoplasm of bladder ([place])    I asked him to schedule a follow-up visit with Dr. Louis Meckel.      Depression    His depression is in remission.        Unprioritized   Cigarette smoker    I encouraged him to continue to cut down and attempt to quit.           Michel Bickers, MD [facility] [place] [facility] [phone] pager   [phone] cell 11/27/2020, 2:21 PM

## 2020-11-27 NOTE — Assessment & Plan Note (Signed)
I asked him to schedule a follow-up visit with Dr. Louis Meckel.

## 2020-11-27 NOTE — Assessment & Plan Note (Signed)
His CD4 count remains normal but he has not had good viral suppression for several years.  I told him that the only reasons that his viral load would be elevated would be 1.  He has missing doses frequently or 2.  His virus has developed resistance to [employer].  He told me he thinks that it has been because he had been missing doses frequently.  He feels like he is doing better since he started using a pillbox several months ago.  He will get repeat lab work today and follow-up in 1 month.

## 2020-11-28 LAB — T-HELPER CELL (CD4) - (RCID CLINIC ONLY)
CD4 % Helper T Cell: 40 % (ref 33–65)
CD4 T Cell Abs: 408 /uL (ref 400–1790)

## 2020-12-06 LAB — HIV-1 INTEGRASE GENOTYPE

## 2020-12-06 LAB — HIV-1 GENOTYPE: HIV-1 Genotype: DETECTED — AB

## 2020-12-06 LAB — HIV-1 RNA QUANT-NO REFLEX-BLD
HIV 1 RNA Quant: 44500 Copies/mL — ABNORMAL HIGH
HIV-1 RNA Quant, Log: 4.65 Log cps/mL — ABNORMAL HIGH

## 2020-12-12 ENCOUNTER — Other Ambulatory Visit (HOSPITAL_COMMUNITY): Payer: Self-pay

## 2020-12-14 ENCOUNTER — Other Ambulatory Visit: Payer: Self-pay | Admitting: Internal Medicine

## 2020-12-14 ENCOUNTER — Other Ambulatory Visit (HOSPITAL_COMMUNITY): Payer: Self-pay

## 2020-12-14 DIAGNOSIS — B20 Human immunodeficiency virus [HIV] disease: Secondary | ICD-10-CM

## 2020-12-17 ENCOUNTER — Other Ambulatory Visit (HOSPITAL_COMMUNITY): Payer: Self-pay

## 2020-12-17 MED ORDER — SYMTUZA 800-150-200-10 MG PO TABS
1.0000 | ORAL_TABLET | Freq: Every day | ORAL | 0 refills | Status: DC
  Filled 2020-12-17: qty 30, 30d supply, fill #0

## 2020-12-25 ENCOUNTER — Ambulatory Visit: Payer: 59 | Admitting: Internal Medicine

## 2021-01-10 ENCOUNTER — Telehealth: Payer: Self-pay | Admitting: Nurse Practitioner

## 2021-01-10 ENCOUNTER — Other Ambulatory Visit (HOSPITAL_COMMUNITY): Payer: Self-pay

## 2021-01-10 NOTE — Telephone Encounter (Signed)
Pt was call VM was left to schedule a AWV appointment

## 2021-01-14 ENCOUNTER — Other Ambulatory Visit (HOSPITAL_COMMUNITY): Payer: Self-pay

## 2021-01-14 ENCOUNTER — Other Ambulatory Visit: Payer: Self-pay | Admitting: Internal Medicine

## 2021-01-14 DIAGNOSIS — B20 Human immunodeficiency virus [HIV] disease: Secondary | ICD-10-CM

## 2021-01-14 MED FILL — Albuterol Sulfate Inhal Aero 108 MCG/ACT (90MCG Base Equiv): RESPIRATORY_TRACT | 25 days supply | Qty: 8.5 | Fill #1 | Status: AC

## 2021-01-15 ENCOUNTER — Other Ambulatory Visit (HOSPITAL_COMMUNITY): Payer: Self-pay

## 2021-01-15 MED ORDER — SYMTUZA 800-150-200-10 MG PO TABS
1.0000 | ORAL_TABLET | Freq: Every day | ORAL | 0 refills | Status: DC
  Filled 2021-01-15: qty 30, 30d supply, fill #0

## 2021-01-17 ENCOUNTER — Other Ambulatory Visit (HOSPITAL_COMMUNITY): Payer: Self-pay

## 2021-01-22 ENCOUNTER — Ambulatory Visit: Payer: 59 | Admitting: Internal Medicine

## 2021-01-22 ENCOUNTER — Other Ambulatory Visit (HOSPITAL_COMMUNITY): Payer: Self-pay

## 2021-01-30 ENCOUNTER — Other Ambulatory Visit: Payer: Self-pay | Admitting: Family

## 2021-01-30 DIAGNOSIS — B20 Human immunodeficiency virus [HIV] disease: Secondary | ICD-10-CM

## 2021-02-13 ENCOUNTER — Ambulatory Visit: Payer: Medicare Other | Admitting: Family Medicine

## 2021-02-13 ENCOUNTER — Ambulatory Visit: Payer: 59 | Admitting: Nurse Practitioner

## 2021-02-15 ENCOUNTER — Other Ambulatory Visit (HOSPITAL_COMMUNITY): Payer: Self-pay

## 2021-02-21 ENCOUNTER — Telehealth: Payer: Self-pay

## 2021-02-21 NOTE — Telephone Encounter (Signed)
Called patient to schedule overdue follow up, no answer and voicemail box not set up.   Beryle Flock, RN

## 2021-02-22 ENCOUNTER — Other Ambulatory Visit (HOSPITAL_COMMUNITY): Payer: Self-pay

## 2021-03-14 ENCOUNTER — Telehealth: Payer: Self-pay

## 2021-03-14 NOTE — Telephone Encounter (Signed)
Patient noted to be overdue to office visit with labs. Previous attempts at trying to reach patient was unsuccessful.  Connected with patient today. Patient agrees to schedule appointment.  Call transferred to scheduling.  Eugenia Mcalpine

## 2021-03-18 ENCOUNTER — Other Ambulatory Visit: Payer: 59

## 2021-04-02 ENCOUNTER — Ambulatory Visit (INDEPENDENT_AMBULATORY_CARE_PROVIDER_SITE_OTHER): Payer: 59 | Admitting: Internal Medicine

## 2021-04-02 ENCOUNTER — Other Ambulatory Visit: Payer: Self-pay

## 2021-04-02 ENCOUNTER — Other Ambulatory Visit (HOSPITAL_COMMUNITY): Payer: Self-pay

## 2021-04-02 ENCOUNTER — Encounter: Payer: Self-pay | Admitting: Internal Medicine

## 2021-04-02 DIAGNOSIS — R634 Abnormal weight loss: Secondary | ICD-10-CM

## 2021-04-02 DIAGNOSIS — B20 Human immunodeficiency virus [HIV] disease: Secondary | ICD-10-CM

## 2021-04-02 DIAGNOSIS — J45909 Unspecified asthma, uncomplicated: Secondary | ICD-10-CM

## 2021-04-02 MED ORDER — ALBUTEROL SULFATE HFA 108 (90 BASE) MCG/ACT IN AERS
2.0000 | INHALATION_SPRAY | Freq: Four times a day (QID) | RESPIRATORY_TRACT | 1 refills | Status: DC | PRN
  Filled 2021-04-02: qty 8.5, 25d supply, fill #0
  Filled 2021-04-16 – 2021-05-08 (×2): qty 8.5, 25d supply, fill #1

## 2021-04-02 MED ORDER — SYMTUZA 800-150-200-10 MG PO TABS
1.0000 | ORAL_TABLET | Freq: Every day | ORAL | 2 refills | Status: DC
  Filled 2021-04-02 – 2021-04-10 (×2): qty 30, 30d supply, fill #0
  Filled 2021-05-08: qty 30, 30d supply, fill #1
  Filled 2021-06-03: qty 30, 30d supply, fill #2

## 2021-04-02 NOTE — Progress Notes (Signed)
Met with [name] today to discuss adherence. He is not taking his Symtuza right now and states that he has a hard time swallowing the large pills. He is requesting a liquid form of medication. I explained that there are not great options for liquid, unfortunately. I gave him a pill splitter and showed him how to split it in half. Advised him to take one half right after the other and never to not completely take it. [name] will work with him on adherence. He will also try to take it with apple sauce. He gets it mailed from [facility], but they have a hard time getting in touch with him. I asked that they call [name] if they cannot get in touch with him.

## 2021-04-02 NOTE — Progress Notes (Signed)
Patient Active Problem List   Diagnosis Date Noted   HIV disease ([place]) 04/18/2015    Priority: High   Depression 04/19/2015    Priority: Medium   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    Priority: Medium   Malignant neoplasm of bladder ([place]) 03/30/2015    Priority: Medium   Cigarette smoker 11/27/2020   Frequent falls 07/21/2019   Unintentional weight loss 07/01/2016   Anorexia 03/11/2016   Syncope 10/31/2015   HTN (hypertension) 03/30/2015   Asthma, chronic 03/30/2015    Patient's Medications  New Prescriptions   No medications on file  Previous Medications   AMLODIPINE (NORVASC) 5 MG TABLET    TAKE 1 TABLET (5 MG TOTAL) BY MOUTH DAILY.   DARUNAVIR-COBICISCTAT-EMTRICITABINE-TENOFOVIR ALAFENAMIDE (SYMTUZA) 800-150-200-10 MG TABS    TAKE 1 TABLET BY MOUTH DAILY WITH BREAKFAST.   ENSURE (ENSURE)    Take 1 Can by mouth 2 (two) times daily between meals.   IPRATROPIUM-ALBUTEROL (DUONEB) 0.5-2.5 (3) MG/3ML SOLN    Take 3 mLs by nebulization every 6 (six) hours as needed (wheezing and chest tightness).  Modified Medications   Modified Medication Previous Medication   ALBUTEROL (VENTOLIN HFA) 108 (90 BASE) MCG/ACT INHALER albuterol (VENTOLIN HFA) 108 (90 Base) MCG/ACT inhaler      INHALE 2 PUFFS BY MOUTH INTO THE LUNGS EVERY 6 HOURS AS NEEDED FOR WHEEZING OR SHORTNESS OF BREATH    INHALE 2 PUFFS BY MOUTH INTO THE LUNGS EVERY 6 HOURS AS NEEDED FOR WHEEZING OR SHORTNESS OF BREATH  Discontinued Medications   No medications on file    Subjective: [name] is in today for his routine HIV follow-up visit.  He was brought in by our Engineer, materials.  [name] continues to live alone.  He says that he did not get his Symtuza last month.  He is not sure why.  He does not know the name of his pharmacy.  He says that he is out of his albuterol inhaler and amlodipine.  He has canceled recent visits with his PCP.  He does not know the name of his PCP.  He says that he has been  missing doses of his Symtuza.  He asked if his HIV pill could come in liquid form.  Review of Systems: Review of Systems  Constitutional:  Positive for weight loss. Negative for fever.  Respiratory:  Negative for cough.   Cardiovascular:  Negative for chest pain.  Gastrointestinal:  Negative for abdominal pain, diarrhea, nausea and vomiting.  Genitourinary:  Negative for dysuria and hematuria.  Psychiatric/Behavioral:  Negative for depression.    Past Medical History:  Diagnosis Date   Anorexia    Asthma with COPD ([place])    Bladder cancer ([place])    and prostatic urethra cancer  HX TURBT'S IN [place], [name]  WITH INSTILLATION CHEMO TX'S   Bladder tumor    History of transfusion    HIV disease ([place])    under care of [name] Infectious disease [facility]   Hypertension    Nocturia    Vitamin D deficiency     Social History   Tobacco Use   Smoking status: Every Day    Packs/day: 0.50    Years: 17.00    Pack years: 8.50    Types: Cigarettes   Smokeless tobacco: Never   Tobacco comments:    3/day;  Vaping Use   Vaping Use: Never used  Substance Use Topics   Alcohol use: Yes  Alcohol/week: 6.0 standard drinks    Types: 6 Cans of beer per week    Comment: occasional beer   Drug use: Not Currently    Types: Marijuana    Comment: occassional    Family History  Problem Relation Age of Onset   Cancer Mother        Gastric Cancer   Hypertension Mother    Heart disease Mother    Cancer Maternal Grandmother        breast cancer     Allergies  Allergen Reactions   Aspirin Shortness Of Breath, Nausea And Vomiting and Swelling    Health Maintenance  Topic Date Due   Zoster Vaccines- Shingrix (1 of 2) Never done   COLONOSCOPY (Pts 45-57yr Insurance coverage will need to be confirmed)  Never done   PNA vac Low Risk Adult (1 of 2 - PCV13) Never done   COVID-19 Vaccine (2 - Pfizer risk series) 04/17/2020   INFLUENZA VACCINE  03/04/2021   TETANUS/TDAP  04/02/2027  (Originally 10/04/1974)   Hepatitis C Screening  Completed   HPV VACCINES  Aged Out    Objective:  Vitals:   04/02/21 1436  BP: (!) 174/67  Pulse: 75  Temp: 97.8 F (36.6 C)  TempSrc: Oral  SpO2: 99%  Weight: 108 lb (49 kg)  Height: '5\' 4"'$  (1.626 m)   Body mass index is 18.54 kg/m.  Physical Exam Constitutional:      Comments: He has continued to lose weight over the past year.  He is more disheveled and forgetful than normal.  Cardiovascular:     Rate and Rhythm: Normal rate and regular rhythm.     Heart sounds: No murmur heard. Pulmonary:     Effort: Pulmonary effort is normal.     Breath sounds: Normal breath sounds.    Lab Results Lab Results  Component Value Date   WBC 3.0 (L) 01/19/2020   HGB 12.6 (L) 01/19/2020   HCT 36.7 (L) 01/19/2020   MCV 100.8 (H) 01/19/2020   PLT 191 01/19/2020    Lab Results  Component Value Date   CREATININE 0.82 01/19/2020   BUN 11 01/19/2020   NA 121 (L) 01/19/2020   K 4.6 01/19/2020   CL 93 (L) 01/19/2020   CO2 20 01/19/2020    Lab Results  Component Value Date   ALT 26 01/19/2020   AST 71 (H) 01/19/2020   ALKPHOS 157 (H) 05/11/2019   BILITOT 0.4 01/19/2020    Lab Results  Component Value Date   CHOL 99 (L) 05/11/2019   HDL 71 05/11/2019   LDLCALC 17 05/11/2019   TRIG 40 05/11/2019   CHOLHDL 1.4 05/11/2019   Lab Results  Component Value Date   LABRPR NON-REACTIVE 01/19/2020   HIV 1 RNA Quant (Copies/mL)  Date Value  11/27/2020 44,500 (H)  08/15/2020 20,500 (H)  03/07/2020 10,000 (H)   CD4 T Cell Abs (/uL)  Date Value  11/27/2020 408  08/15/2020 442  03/07/2020 435     Problem List Items Addressed This Visit       High   HIV disease ([place]    It does not appear that LJediis capable of adhering to any of his medications.  I am concerned that he should not be living alone.  Pharmacist has reviewed his medications today.  He has an appointment with his PCP upcoming.  He will get repeat blood work today  and we will attempt to keep him on Symtuza.  He was instructed  on how to crush the tablets and was given a pill crusher.  He will follow-up with me in 2 months.      Relevant Orders   T-helper cell (CD4)- ([name] clinic only)   HIV-1 RNA quant-no reflex-bld     Unprioritized   Asthma, chronic    I did give him a refill of his albuterol and instructed him to follow-up with his PCP.      Relevant Medications   albuterol (VENTOLIN HFA) 108 (90 Base) MCG/ACT inhaler   Unintentional weight loss    I suspect that his weight loss is multifactorial.  Poorly controlled HIV infection is likely a contributor but also more generalized failure to thrive.         Michel Bickers, MD [facility] for [facility] [place] [facility] [phone] pager   [phone] cell 04/02/2021, 3:20 PM

## 2021-04-02 NOTE — Assessment & Plan Note (Signed)
I did give him a refill of his albuterol and instructed him to follow-up with his PCP.

## 2021-04-02 NOTE — Addendum Note (Signed)
Addended by: Darletta Moll on: 04/02/2021 04:06 PM   Modules accepted: Orders

## 2021-04-02 NOTE — Assessment & Plan Note (Signed)
I suspect that his weight loss is multifactorial.  Poorly controlled HIV infection is likely a contributor but also more generalized failure to thrive.

## 2021-04-02 NOTE — Assessment & Plan Note (Signed)
It does not appear that [name] is capable of adhering to any of his medications.  I am concerned that he should not be living alone.  Pharmacist has reviewed his medications today.  He has an appointment with his PCP upcoming.  He will get repeat blood work today and we will attempt to keep him on Symtuza.  He was instructed on how to crush the tablets and was given a pill crusher.  He will follow-up with me in 2 months.

## 2021-04-03 LAB — T-HELPER CELL (CD4) - (RCID CLINIC ONLY)
CD4 % Helper T Cell: 32 % — ABNORMAL LOW (ref 33–65)
CD4 T Cell Abs: 443 /uL (ref 400–1790)

## 2021-04-04 LAB — HIV-1 RNA QUANT-NO REFLEX-BLD
HIV 1 RNA Quant: 38900 Copies/mL — ABNORMAL HIGH
HIV-1 RNA Quant, Log: 4.59 Log cps/mL — ABNORMAL HIGH

## 2021-04-09 ENCOUNTER — Other Ambulatory Visit (HOSPITAL_COMMUNITY): Payer: Self-pay

## 2021-04-10 ENCOUNTER — Other Ambulatory Visit (HOSPITAL_COMMUNITY): Payer: Self-pay

## 2021-04-11 ENCOUNTER — Telehealth: Payer: Self-pay

## 2021-04-11 ENCOUNTER — Other Ambulatory Visit (HOSPITAL_COMMUNITY): Payer: Self-pay

## 2021-04-11 ENCOUNTER — Other Ambulatory Visit: Payer: Self-pay | Admitting: Nurse Practitioner

## 2021-04-11 DIAGNOSIS — I10 Essential (primary) hypertension: Secondary | ICD-10-CM

## 2021-04-11 MED ORDER — AMLODIPINE BESYLATE 5 MG PO TABS
5.0000 mg | ORAL_TABLET | Freq: Every day | ORAL | 5 refills | Status: DC
  Filled 2021-04-11: qty 30, 30d supply, fill #0
  Filled 2021-05-08: qty 30, 30d supply, fill #1
  Filled 2021-06-03: qty 30, 30d supply, fill #2
  Filled 2021-08-06: qty 30, 30d supply, fill #3
  Filled 2021-08-27: qty 30, 30d supply, fill #4
  Filled 2021-09-24 – 2021-10-22 (×2): qty 30, 30d supply, fill #5

## 2021-04-11 NOTE — Telephone Encounter (Signed)
BP med's refill   Send to [facility]

## 2021-04-11 NOTE — Telephone Encounter (Signed)
[name] Patient Advocate Encounter  Patient's medications have been couriered to [name] from [facility] and will be picked up .  [name] , [facility] Patient [facility] for Infectious Disease Phone: [phone] Fax:  [phone]

## 2021-04-12 ENCOUNTER — Other Ambulatory Visit (HOSPITAL_COMMUNITY): Payer: Self-pay

## 2021-04-12 ENCOUNTER — Ambulatory Visit: Payer: 59 | Admitting: Nurse Practitioner

## 2021-04-16 ENCOUNTER — Other Ambulatory Visit (HOSPITAL_COMMUNITY): Payer: Self-pay

## 2021-04-22 ENCOUNTER — Other Ambulatory Visit (HOSPITAL_COMMUNITY): Payer: Self-pay

## 2021-04-30 ENCOUNTER — Other Ambulatory Visit (HOSPITAL_COMMUNITY): Payer: Self-pay

## 2021-05-06 ENCOUNTER — Other Ambulatory Visit (HOSPITAL_COMMUNITY): Payer: Self-pay

## 2021-05-08 ENCOUNTER — Other Ambulatory Visit (HOSPITAL_COMMUNITY): Payer: Self-pay

## 2021-05-22 ENCOUNTER — Ambulatory Visit: Payer: 59 | Admitting: Nurse Practitioner

## 2021-06-03 ENCOUNTER — Other Ambulatory Visit: Payer: Self-pay | Admitting: Internal Medicine

## 2021-06-03 ENCOUNTER — Other Ambulatory Visit (HOSPITAL_COMMUNITY): Payer: Self-pay

## 2021-06-03 DIAGNOSIS — J45909 Unspecified asthma, uncomplicated: Secondary | ICD-10-CM

## 2021-06-04 ENCOUNTER — Ambulatory Visit: Payer: 59 | Admitting: Internal Medicine

## 2021-06-04 ENCOUNTER — Other Ambulatory Visit (HOSPITAL_COMMUNITY): Payer: Self-pay

## 2021-06-05 ENCOUNTER — Other Ambulatory Visit (HOSPITAL_COMMUNITY): Payer: Self-pay

## 2021-06-06 ENCOUNTER — Other Ambulatory Visit (HOSPITAL_COMMUNITY): Payer: Self-pay

## 2021-06-06 MED ORDER — ALBUTEROL SULFATE HFA 108 (90 BASE) MCG/ACT IN AERS
2.0000 | INHALATION_SPRAY | Freq: Four times a day (QID) | RESPIRATORY_TRACT | 1 refills | Status: DC | PRN
  Filled 2021-06-06: qty 8.5, 25d supply, fill #0
  Filled 2021-08-06: qty 8.5, 25d supply, fill #1

## 2021-06-11 ENCOUNTER — Other Ambulatory Visit (HOSPITAL_COMMUNITY): Payer: Self-pay

## 2021-06-25 ENCOUNTER — Ambulatory Visit: Payer: 59 | Admitting: Internal Medicine

## 2021-07-01 ENCOUNTER — Other Ambulatory Visit (HOSPITAL_COMMUNITY): Payer: Self-pay

## 2021-07-04 ENCOUNTER — Other Ambulatory Visit: Payer: Self-pay | Admitting: Internal Medicine

## 2021-07-04 ENCOUNTER — Other Ambulatory Visit (HOSPITAL_COMMUNITY): Payer: Self-pay

## 2021-07-04 DIAGNOSIS — B20 Human immunodeficiency virus [HIV] disease: Secondary | ICD-10-CM

## 2021-07-05 ENCOUNTER — Other Ambulatory Visit (HOSPITAL_COMMUNITY): Payer: Self-pay

## 2021-07-05 MED ORDER — SYMTUZA 800-150-200-10 MG PO TABS
1.0000 | ORAL_TABLET | Freq: Every day | ORAL | 0 refills | Status: DC
  Filled 2021-07-05: qty 30, 30d supply, fill #0

## 2021-07-16 ENCOUNTER — Ambulatory Visit: Payer: 59 | Admitting: Internal Medicine

## 2021-08-02 ENCOUNTER — Other Ambulatory Visit (HOSPITAL_COMMUNITY): Payer: Self-pay

## 2021-08-06 ENCOUNTER — Ambulatory Visit: Payer: 59 | Admitting: Internal Medicine

## 2021-08-06 ENCOUNTER — Other Ambulatory Visit: Payer: Self-pay | Admitting: Internal Medicine

## 2021-08-06 ENCOUNTER — Other Ambulatory Visit (HOSPITAL_COMMUNITY): Payer: Self-pay

## 2021-08-06 DIAGNOSIS — B20 Human immunodeficiency virus [HIV] disease: Secondary | ICD-10-CM

## 2021-08-06 NOTE — Telephone Encounter (Signed)
Appt. Updated

## 2021-08-06 NOTE — Telephone Encounter (Signed)
Patient has appointment 08/06/2021

## 2021-08-07 ENCOUNTER — Other Ambulatory Visit (HOSPITAL_COMMUNITY): Payer: Self-pay

## 2021-08-07 ENCOUNTER — Other Ambulatory Visit: Payer: Self-pay | Admitting: Internal Medicine

## 2021-08-07 DIAGNOSIS — B20 Human immunodeficiency virus [HIV] disease: Secondary | ICD-10-CM

## 2021-08-07 MED ORDER — SYMTUZA 800-150-200-10 MG PO TABS
1.0000 | ORAL_TABLET | Freq: Every day | ORAL | 5 refills | Status: DC
  Filled 2021-08-07: qty 30, 30d supply, fill #0
  Filled 2021-08-27: qty 30, 30d supply, fill #1
  Filled 2021-09-24 – 2021-10-22 (×2): qty 30, 30d supply, fill #2
  Filled 2021-11-18: qty 30, 30d supply, fill #3
  Filled 2021-12-12: qty 30, 30d supply, fill #4
  Filled 2022-01-10: qty 30, 30d supply, fill #5

## 2021-08-09 ENCOUNTER — Other Ambulatory Visit (HOSPITAL_COMMUNITY): Payer: Self-pay

## 2021-08-09 ENCOUNTER — Ambulatory Visit: Payer: 59 | Admitting: Nurse Practitioner

## 2021-08-20 ENCOUNTER — Ambulatory Visit (INDEPENDENT_AMBULATORY_CARE_PROVIDER_SITE_OTHER): Payer: 59 | Admitting: Internal Medicine

## 2021-08-20 ENCOUNTER — Encounter: Payer: Self-pay | Admitting: Internal Medicine

## 2021-08-20 ENCOUNTER — Other Ambulatory Visit: Payer: Self-pay

## 2021-08-20 DIAGNOSIS — I10 Essential (primary) hypertension: Secondary | ICD-10-CM

## 2021-08-20 DIAGNOSIS — B20 Human immunodeficiency virus [HIV] disease: Secondary | ICD-10-CM | POA: Diagnosis not present

## 2021-08-20 NOTE — Assessment & Plan Note (Signed)
His infection has been poorly controlled over the last 3 years and his ability to adhere to a Symtuza has been well documented by our Engineer, materials.  He will get repeat blood work today and follow-up in 3 months.  I strongly encouraged him to talk with our bridge counselor about realistic options safe for and more stable housing.  He really needs some sort of assisted living facility but so far is unwilling to accept that assistance.

## 2021-08-20 NOTE — Progress Notes (Signed)
Patient Active Problem List   Diagnosis Date Noted   HIV disease ([place]) 04/18/2015    Priority: High   Depression 04/19/2015    Priority: Medium    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    Priority: Medium    Malignant neoplasm of bladder ([place]) 03/30/2015    Priority: Medium    Cigarette smoker 11/27/2020   Frequent falls 07/21/2019   Unintentional weight loss 07/01/2016   Anorexia 03/11/2016   Syncope 10/31/2015   HTN (hypertension) 03/30/2015   Asthma, chronic 03/30/2015    Patient's Medications  New Prescriptions   No medications on file  Previous Medications   ALBUTEROL (VENTOLIN HFA) 108 (90 BASE) MCG/ACT INHALER    INHALE 2 PUFFS BY MOUTH INTO THE LUNGS EVERY 6 HOURS AS NEEDED FOR WHEEZING OR SHORTNESS OF BREATH   AMLODIPINE (NORVASC) 5 MG TABLET    Take 1 tablet (5 mg total) by mouth daily.   DARUNAVIR-COBICISTAT-EMTRICITABINE-TENOFOVIR ALAFENAMIDE (SYMTUZA) 800-150-200-10 MG TABS    Take 1 tablet by mouth daily with breakfast.   ENSURE (ENSURE)    Take 1 Can by mouth 2 (two) times daily between meals.   IPRATROPIUM-ALBUTEROL (DUONEB) 0.5-2.5 (3) MG/3ML SOLN    Take 3 mLs by nebulization every 6 (six) hours as needed (wheezing and chest tightness).  Modified Medications   No medications on file  Discontinued Medications   No medications on file    Subjective: Geovanni is in for his routine HIV follow-up visit.  He lost his apartment recently.  He cannot tell me why his landlord kicked him out.  He has been staying at the [employer] [facility] [employer] which he, and our Engineer, materials, tell me his a haven for drug addicts and prostitutes.  Cadyn does not feel safe there and is paying $700 each month more than he can afford.  He was offered assistance with section 8 housing but did not take it.  When I asked him if he would consider some type of assisted living arrangement he says he does not want to go to "an old folks home".  Her bridge counselor has been taking his  medication to him each month.  He does recognize pictures of Symtuza.  He says that he breaks the pill in half and has been able to swallow his medication without much difficulty recently.  He says that he missed several days about 1 month ago when he had an upset stomach but does not think he has missed any doses in the last 3 weeks.  He says that he takes it each evening before going to bed.  Review of Systems: Review of Systems  Constitutional:  Positive for malaise/fatigue. Negative for fever and weight loss.  Respiratory:  Negative for cough and shortness of breath.   Cardiovascular:  Negative for chest pain.  Gastrointestinal:  Negative for abdominal pain, diarrhea, nausea and vomiting.  Genitourinary:  Positive for hematuria.  Neurological:  Positive for weakness.  Psychiatric/Behavioral:  Positive for depression. The patient is not nervous/anxious.    Past Medical History:  Diagnosis Date   Anorexia    Asthma with COPD ([place])    Bladder cancer ([place])    and prostatic urethra cancer  HX TURBT'S IN [place], [place]  WITH INSTILLATION CHEMO TX'S   Bladder tumor    History of transfusion    HIV disease ([place])    under care of [facility]   Hypertension    Nocturia  Vitamin D deficiency     Social History   Tobacco Use   Smoking status: Some Days    Packs/day: 0.50    Years: 17.00    Pack years: 8.50    Types: Cigarettes   Smokeless tobacco: Never   Tobacco comments:    3/day;  Vaping Use   Vaping Use: Never used  Substance Use Topics   Alcohol use: Yes    Alcohol/week: 6.0 standard drinks    Types: 6 Cans of beer per week    Comment: occasional beer   Drug use: Not Currently    Types: Marijuana    Comment: occassional    Family History  Problem Relation Age of Onset   Cancer Mother        Gastric Cancer   Hypertension Mother    Heart disease Mother    Cancer Maternal Grandmother        breast cancer     Allergies  Allergen  Reactions   Aspirin Shortness Of Breath, Nausea And Vomiting and Swelling    Health Maintenance  Topic Date Due   Pneumonia Vaccine 33+ Years old (1 - PCV) Never done   Zoster Vaccines- Shingrix (1 of 2) Never done   COLONOSCOPY (Pts 45-43yrs Insurance coverage will need to be confirmed)  Never done   COVID-19 Vaccine (2 - Pfizer risk series) 04/17/2020   INFLUENZA VACCINE  03/04/2021   TETANUS/TDAP  04/02/2027 (Originally 10/04/1974)   Hepatitis C Screening  Completed   HPV VACCINES  Aged Out    Objective:  Vitals:   08/20/21 1602  BP: (!) 174/70  Pulse: 65  Temp: (!) 97.4 F (36.3 C)  TempSrc: Oral  Weight: 114 lb (51.7 kg)   Body mass index is 19.57 kg/m.  Physical Exam Constitutional:      Comments: He is seated in a wheelchair that he was given on arrival here today.  He normally walks with the assistance of a cane.  Cardiovascular:     Rate and Rhythm: Normal rate and regular rhythm.     Heart sounds: No murmur heard. Pulmonary:     Effort: Pulmonary effort is normal.     Breath sounds: Normal breath sounds.  Psychiatric:        Mood and Affect: Mood normal.    Lab Results Lab Results  Component Value Date   WBC 3.0 (L) 01/19/2020   HGB 12.6 (L) 01/19/2020   HCT 36.7 (L) 01/19/2020   MCV 100.8 (H) 01/19/2020   PLT 191 01/19/2020    Lab Results  Component Value Date   CREATININE 0.82 01/19/2020   BUN 11 01/19/2020   NA 121 (L) 01/19/2020   K 4.6 01/19/2020   CL 93 (L) 01/19/2020   CO2 20 01/19/2020    Lab Results  Component Value Date   ALT 26 01/19/2020   AST 71 (H) 01/19/2020   ALKPHOS 157 (H) 05/11/2019   BILITOT 0.4 01/19/2020    Lab Results  Component Value Date   CHOL 99 (L) 05/11/2019   HDL 71 05/11/2019   LDLCALC 17 05/11/2019   TRIG 40 05/11/2019   CHOLHDL 1.4 05/11/2019   Lab Results  Component Value Date   LABRPR NON-REACTIVE 01/19/2020   HIV 1 RNA Quant (Copies/mL)  Date Value  04/02/2021 38,900 (H)  11/27/2020 44,500  (H)  08/15/2020 20,500 (H)   CD4 T Cell Abs (/uL)  Date Value  04/02/2021 443  11/27/2020 408  08/15/2020 442     Problem  List Items Addressed This Visit       High   HIV disease ([place])    His infection has been poorly controlled over the last 3 years and his ability to adhere to a Symtuza has been well documented by our bridge counselor.  He will get repeat blood work today and follow-up in 3 months.  I strongly encouraged him to talk with our bridge counselor about realistic options safe for and more stable housing.  He really needs some sort of assisted living facility but so far is unwilling to accept that assistance.      Relevant Orders   HIV-1 RNA quant-no reflex-bld   T-helper cells (CD4) count   Other Visit Diagnoses     Essential hypertension             Michel Bickers, MD [facility] for [place] [phone] pager   [phone] cell 08/20/2021, 5:07 PM

## 2021-08-22 LAB — T-HELPER CELLS (CD4) COUNT (NOT AT ARMC)
Absolute CD4: 372 cells/uL — ABNORMAL LOW (ref 490–1740)
CD4 T Helper %: 28 % — ABNORMAL LOW (ref 30–61)
Total lymphocyte count: 1324 cells/uL (ref 850–3900)

## 2021-08-22 LAB — HIV-1 RNA QUANT-NO REFLEX-BLD
HIV 1 RNA Quant: 125000 Copies/mL — ABNORMAL HIGH
HIV-1 RNA Quant, Log: 5.1 Log cps/mL — ABNORMAL HIGH

## 2021-08-27 ENCOUNTER — Other Ambulatory Visit (HOSPITAL_COMMUNITY): Payer: Self-pay

## 2021-08-27 ENCOUNTER — Other Ambulatory Visit: Payer: Self-pay | Admitting: Nurse Practitioner

## 2021-08-27 DIAGNOSIS — J45909 Unspecified asthma, uncomplicated: Secondary | ICD-10-CM

## 2021-08-28 ENCOUNTER — Other Ambulatory Visit (HOSPITAL_COMMUNITY): Payer: Self-pay

## 2021-08-29 ENCOUNTER — Other Ambulatory Visit (HOSPITAL_COMMUNITY): Payer: Self-pay

## 2021-08-30 ENCOUNTER — Other Ambulatory Visit (HOSPITAL_COMMUNITY): Payer: Self-pay

## 2021-09-03 ENCOUNTER — Other Ambulatory Visit (HOSPITAL_COMMUNITY): Payer: Self-pay

## 2021-09-03 ENCOUNTER — Other Ambulatory Visit: Payer: Self-pay | Admitting: Internal Medicine

## 2021-09-03 DIAGNOSIS — J45909 Unspecified asthma, uncomplicated: Secondary | ICD-10-CM

## 2021-09-03 MED ORDER — ALBUTEROL SULFATE HFA 108 (90 BASE) MCG/ACT IN AERS
2.0000 | INHALATION_SPRAY | Freq: Four times a day (QID) | RESPIRATORY_TRACT | 1 refills | Status: DC | PRN
  Filled 2021-09-03: qty 8.5, 25d supply, fill #0

## 2021-09-24 ENCOUNTER — Other Ambulatory Visit (HOSPITAL_COMMUNITY): Payer: Self-pay

## 2021-09-25 ENCOUNTER — Other Ambulatory Visit (HOSPITAL_COMMUNITY): Payer: Self-pay

## 2021-10-03 ENCOUNTER — Other Ambulatory Visit (HOSPITAL_COMMUNITY): Payer: Self-pay

## 2021-10-22 ENCOUNTER — Telehealth: Payer: Self-pay

## 2021-10-22 ENCOUNTER — Other Ambulatory Visit (HOSPITAL_COMMUNITY): Payer: Self-pay

## 2021-10-22 NOTE — Telephone Encounter (Signed)
Mitch with [facility] called, he saw [name] today and checked his pill tray. Mitch reports that Fynn is doing "infinitely" better with medication adherence.  ? ?Dahmir is requesting refills of albuterol and Duoneb. Karn Pickler is working on connecting the patient with a PCP. Will route to provider.  ? ? ?Beryle Flock, RN ? ?

## 2021-10-23 ENCOUNTER — Other Ambulatory Visit: Payer: Self-pay | Admitting: Internal Medicine

## 2021-10-23 ENCOUNTER — Other Ambulatory Visit (HOSPITAL_COMMUNITY): Payer: Self-pay

## 2021-10-23 DIAGNOSIS — J441 Chronic obstructive pulmonary disease with (acute) exacerbation: Secondary | ICD-10-CM

## 2021-10-23 DIAGNOSIS — J45909 Unspecified asthma, uncomplicated: Secondary | ICD-10-CM

## 2021-10-23 MED ORDER — IPRATROPIUM-ALBUTEROL 0.5-2.5 (3) MG/3ML IN SOLN
3.0000 mL | Freq: Four times a day (QID) | RESPIRATORY_TRACT | 1 refills | Status: DC | PRN
  Filled 2021-10-23: qty 360, 30d supply, fill #0
  Filled 2021-11-18: qty 360, 30d supply, fill #1

## 2021-10-23 MED ORDER — ALBUTEROL SULFATE HFA 108 (90 BASE) MCG/ACT IN AERS
2.0000 | INHALATION_SPRAY | Freq: Four times a day (QID) | RESPIRATORY_TRACT | 1 refills | Status: DC | PRN
  Filled 2021-10-23: qty 8.5, 25d supply, fill #0
  Filled 2021-11-18: qty 8.5, 25d supply, fill #1

## 2021-11-12 ENCOUNTER — Ambulatory Visit: Payer: 59 | Admitting: Internal Medicine

## 2021-11-18 ENCOUNTER — Other Ambulatory Visit: Payer: Self-pay | Admitting: Nurse Practitioner

## 2021-11-18 ENCOUNTER — Other Ambulatory Visit (HOSPITAL_COMMUNITY): Payer: Self-pay

## 2021-11-18 ENCOUNTER — Telehealth: Payer: Self-pay

## 2021-11-18 DIAGNOSIS — I10 Essential (primary) hypertension: Secondary | ICD-10-CM

## 2021-11-18 MED ORDER — AMLODIPINE BESYLATE 5 MG PO TABS
5.0000 mg | ORAL_TABLET | Freq: Every day | ORAL | 0 refills | Status: DC
  Filled 2021-11-18: qty 30, 30d supply, fill #0

## 2021-11-18 NOTE — Telephone Encounter (Signed)
Mitch with [facility] requesting refill of amlodipine for patient as he is almost out. Last prescribed by Dionisio David, but patient has not been able to keep an appointment with her yet. Will send in 30 day supply to bridge patient.  ? ?Beryle Flock, RN ? ?

## 2021-11-26 ENCOUNTER — Ambulatory Visit: Payer: 59 | Admitting: Internal Medicine

## 2021-12-12 ENCOUNTER — Ambulatory Visit: Payer: 59 | Admitting: Internal Medicine

## 2021-12-12 ENCOUNTER — Other Ambulatory Visit: Payer: Self-pay | Admitting: Internal Medicine

## 2021-12-12 ENCOUNTER — Telehealth: Payer: Self-pay | Admitting: Internal Medicine

## 2021-12-12 ENCOUNTER — Other Ambulatory Visit (HOSPITAL_COMMUNITY): Payer: Self-pay

## 2021-12-12 DIAGNOSIS — I10 Essential (primary) hypertension: Secondary | ICD-10-CM

## 2021-12-12 DIAGNOSIS — J45909 Unspecified asthma, uncomplicated: Secondary | ICD-10-CM

## 2021-12-12 DIAGNOSIS — J441 Chronic obstructive pulmonary disease with (acute) exacerbation: Secondary | ICD-10-CM

## 2021-12-12 NOTE — Telephone Encounter (Signed)
I was feeling very hopeful a couple of weeks ago - was monitoring his pill tray weekly - and things looked pretty good - the past couple of weeks - he has refused wait for me to come and monitor that - and said he could do it on his own and he was taking his meds (for some reason - his credibility with me is not great)  He was doing soooo much better, had more energy - was able to communicate better - etc - that seems to have declined again.  ? ?This week - he called - the nurse that comes to monitor him from the home health agency - told him he needed a scooter and you had to sign a form for that to happen.  I think that makes no sense - he lives in a rooming house - with a couple of steps to get in - and exactly how that will work - I have no idea.  (I have not had many good experiences with these home health agencies unfortunately)  Of course he can't tell me her name - the agency - etc etc. I am going to tell him to have the nurse call me - and we can go from there.  ? ?I am trying to get him a microwave for his room.  I have been promised a gift card for this survey I did for a study - and was going to use that to pay for it.   Today - I am going to tell him to have the nurse call me - AND if he doesn't wait for me to come and do the pill try - the deal for the microwave is off the table.  (that is why I was getting it for him - he was being so cooperative - which is not like him)  Maybe he is mad it is taking a while - will try to sort that out today. ? ?I will work on the pill tray today - and have been giving him ensure to help him get calories. ? ?I still think he needs to be in assisted living - but he refuses to discuss that.  I will again discuss ?primary care? with him - he had repeatedly refused that - since the nurse comes to monitor him.  The home health agency is not all bad - the personal care worker lives around the corner from him - and checks on him regularly.  ? ?I am holding my breath that he  will come - couldn't get him on the phone yesterday.  (never a good sign)  ? ? ? ?Starr Lake ?Bridge Counselor ? ?

## 2021-12-12 NOTE — Telephone Encounter (Signed)
Do you want to continue filling or have PCP take over? Thanks  ?

## 2021-12-16 ENCOUNTER — Other Ambulatory Visit (HOSPITAL_COMMUNITY): Payer: Self-pay

## 2021-12-31 ENCOUNTER — Other Ambulatory Visit (HOSPITAL_COMMUNITY): Payer: Self-pay

## 2021-12-31 ENCOUNTER — Encounter: Payer: Self-pay | Admitting: Internal Medicine

## 2021-12-31 ENCOUNTER — Telehealth: Payer: Self-pay | Admitting: Internal Medicine

## 2021-12-31 ENCOUNTER — Other Ambulatory Visit: Payer: Self-pay

## 2021-12-31 ENCOUNTER — Ambulatory Visit (INDEPENDENT_AMBULATORY_CARE_PROVIDER_SITE_OTHER): Payer: 59 | Admitting: Internal Medicine

## 2021-12-31 DIAGNOSIS — J45909 Unspecified asthma, uncomplicated: Secondary | ICD-10-CM

## 2021-12-31 DIAGNOSIS — I10 Essential (primary) hypertension: Secondary | ICD-10-CM | POA: Diagnosis not present

## 2021-12-31 DIAGNOSIS — B20 Human immunodeficiency virus [HIV] disease: Secondary | ICD-10-CM | POA: Diagnosis not present

## 2021-12-31 DIAGNOSIS — C679 Malignant neoplasm of bladder, unspecified: Secondary | ICD-10-CM

## 2021-12-31 MED ORDER — ALBUTEROL SULFATE HFA 108 (90 BASE) MCG/ACT IN AERS
2.0000 | INHALATION_SPRAY | Freq: Four times a day (QID) | RESPIRATORY_TRACT | 1 refills | Status: DC | PRN
  Filled 2021-12-31: qty 6.7, 25d supply, fill #0

## 2021-12-31 MED ORDER — AMLODIPINE BESYLATE 5 MG PO TABS
5.0000 mg | ORAL_TABLET | Freq: Every day | ORAL | 1 refills | Status: DC
  Filled 2021-12-31: qty 30, 30d supply, fill #0

## 2021-12-31 NOTE — Assessment & Plan Note (Signed)
I will give him 2 months worth of refills of amlodipine and have asked him to work with our bridge counselor to schedule a follow-up appointment with his PCP as soon as possible.

## 2021-12-31 NOTE — Assessment & Plan Note (Signed)
He will get updated blood work today and is willing to restart Symtuza.  He tells me that his numbers will be much better when he returns in 6 weeks.

## 2021-12-31 NOTE — Telephone Encounter (Signed)
I met with him Friday - and we had a "come to [place] meeting" to put it lightly...i made it clear that if he did not start taking his medication he was going to die  (he was shocked by that possibility-  he is 58 for Pete's sake!!)  He hates to take pills - I told him it was part of getting older - that I am multiple meds for just my blood pressure (and was tempted to say he was making my blood pressure worse.Marland Kitchenlol)   Stigma is what is going to be his cause of death - he is ashamed he has it - and was horrified that since I had talked to his daughter - she knew what was going on - (we had to talk a while to get past that!)  I do think that admitting this - is probably going to help [place] possibly turn a corner.   He admitted that he is not taking his medication - and is willing to see you - just so he can get his blood pressure meds.  (he said labs would be a waste of time - but I am still curious as to what's going on.Marland Kitchen)   He admitted that brief period when I was pretty sure he was taking his medication he was feeling better - and so we stressed that.   He killed me - kept telling me he knew I was trying hard to help him and that I was a good person -he did not blame me for his problems.  He would alternate between being mad at me - and complimenting me.  Bless his heart.   I have scheduled him to see the Urologist on July 3rd.   We discussed that HIV not under control makes his cancer worse (again he was shocked!)  That was the earliest appointment I could work into my schedule (that they had open)   The conversation was intense - but I left feeling like we had cleared the air and he understood what was going on - (I worked late on Friday to make that happen)   He will get a box of ensure today for keeping the appointment - and if he keeps a couple of appointments with me (I want to monitor his pill tray at least once a week - if not more) I will get him a microwave.  I bought him [employer] after we  talked on Friday.  Desmond Dike - I will NOT be going to the one on [address] again - even though it is just a couple of blocks from where he lives, it was a zoo there!)  I will work on getting him glasses - if he can start keeping appointments too - and we can discuss best option for primary care.  We can try the [facility] on [address] again - that I will bring up today.  (wanted your input/approval)  His vision is pretty bad.  I was thinking internal medicine - but way too much walking to go there - his mobility is not great.   He has already called this morning to confirm - (a first - so I did get his attention)    Starr Lake [facility] [address], [place], [place] [address]  Office [phone] Work cell [phone]

## 2021-12-31 NOTE — Progress Notes (Signed)
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   ENSURE (ENSURE)    Take 1 Can by mouth 2 (two) times daily between meals.   IPRATROPIUM-ALBUTEROL (DUONEB) 0.5-2.5 (3) MG/3ML SOLN    Take 3 mLs by nebulization every 6 (six) hours as needed (wheezing and chest tightness).  Modified Medications   Modified Medication Previous Medication   ALBUTEROL (VENTOLIN HFA) 108 (90 BASE) MCG/ACT INHALER albuterol (VENTOLIN HFA) 108 (90 Base) MCG/ACT inhaler      INHALE 2 PUFFS BY MOUTH INTO THE LUNGS EVERY 6 HOURS AS NEEDED FOR WHEEZING OR SHORTNESS OF BREATH    INHALE 2 PUFFS BY MOUTH INTO THE LUNGS EVERY 6 HOURS AS NEEDED FOR WHEEZING OR SHORTNESS OF BREATH   AMLODIPINE (NORVASC) 5 MG TABLET amLODipine (NORVASC) 5 MG tablet      Take 1 tablet (5 mg total) by mouth daily.    Take 1 tablet (5 mg total) by mouth daily.  Discontinued Medications   No medications on file    Subjective: [name] is in for his routine HIV follow-up appointment.  He was brought in by our bridge counselor today after missing several appointments.  [name] readily admits that he has not been taking [place].,  He says that when he was taking it on a regular basis he did feel much better.  He has also been off of his amlodipine because he has been out.   He has not seen his PCP in 1 year and a half.  He has been having intermittent pains in his side and lower abdomen with some ongoing intermittent hematuria.  He tells me that he feels like everything is falling apart.  Review of Systems: Review of Systems  Constitutional:  Positive for malaise/fatigue. Negative for fever.  Respiratory:  Positive for cough.   Cardiovascular:  Negative for chest pain.  Gastrointestinal:  Positive for abdominal pain. Negative for diarrhea.  Genitourinary:  Positive for hematuria. Negative for dysuria.  Neurological:  Positive for tremors.  Psychiatric/Behavioral:  Positive for depression.    Past Medical History:  Diagnosis Date   Anorexia    Asthma with COPD ([place])    Bladder cancer ([place])    and prostatic urethra cancer  HX TURBT'S IN [place], [place]  WITH INSTILLATION CHEMO TX'S   Bladder tumor    History of transfusion    HIV disease ([place])    under care of [facility]   Hypertension    Nocturia    Vitamin D deficiency     Social History   Tobacco Use   Smoking status: Some Days    Packs/day: 0.50    Years: 17.00    Pack years: 8.50    Types: Cigarettes   Smokeless tobacco: Never  Tobacco comments:    3/day;  Vaping Use   Vaping Use: Never used  Substance Use Topics   Alcohol use: Yes    Alcohol/week: 6.0 standard drinks    Types: 6 Cans of beer per week    Comment: occasional beer   Drug use: Not Currently    Types: Marijuana    Comment: occassional    Family History  Problem Relation Age of Onset   Cancer Mother        Gastric Cancer   Hypertension Mother    Heart disease Mother    Cancer Maternal Grandmother        breast cancer     Allergies  Allergen Reactions   Aspirin Shortness Of Breath, Nausea And Vomiting and Swelling    Health Maintenance  Topic Date Due   Pneumonia Vaccine 36+ Years old (1 - PCV) Never done   Zoster Vaccines- Shingrix (1 of 2) Never done   COLONOSCOPY (Pts  45-79yr Insurance coverage will need to be confirmed)  Never done   COVID-19 Vaccine (2 - Pfizer risk series) 04/17/2020   TETANUS/TDAP  04/02/2027 (Originally 10/04/1974)   INFLUENZA VACCINE  03/04/2022   Hepatitis C Screening  Completed   HPV VACCINES  Aged Out    Objective:  Vitals:   12/31/21 1627  BP: (!) 149/76  Pulse: 94  Temp: (!) 97.5 F (36.4 C)  TempSrc: Temporal  SpO2: 99%   There is no height or weight on file to calculate BMI.  Physical Exam Constitutional:      Comments: He is in no distress.  He is pleasant and interactive.  He is seated in one of our wheelchairs.  Cardiovascular:     Rate and Rhythm: Normal rate and regular rhythm.     Heart sounds: No murmur heard. Pulmonary:     Effort: Pulmonary effort is normal.     Breath sounds: Normal breath sounds.  Abdominal:     Palpations: Abdomen is soft.     Tenderness: There is no abdominal tenderness.  Neurological:     Comments: He has no visible tremors today.  He walks with the aid of a cane when he is not in the wheelchair.  Psychiatric:        Mood and Affect: Mood normal.    Lab Results Lab Results  Component Value Date   WBC 3.0 (L) 01/19/2020   HGB 12.6 (L) 01/19/2020   HCT 36.7 (L) 01/19/2020   MCV 100.8 (H) 01/19/2020   PLT 191 01/19/2020    Lab Results  Component Value Date   CREATININE 0.82 01/19/2020   BUN 11 01/19/2020   NA 121 (L) 01/19/2020   K 4.6 01/19/2020   CL 93 (L) 01/19/2020   CO2 20 01/19/2020    Lab Results  Component Value Date   ALT 26 01/19/2020   AST 71 (H) 01/19/2020   ALKPHOS 157 (H) 05/11/2019   BILITOT 0.4 01/19/2020    Lab Results  Component Value Date   CHOL 99 (L) 05/11/2019   HDL 71 05/11/2019   LDLCALC 17 05/11/2019   TRIG 40 05/11/2019   CHOLHDL 1.4 05/11/2019   Lab Results  Component Value Date   LABRPR NON-REACTIVE 01/19/2020   HIV 1 RNA Quant (Copies/mL)  Date Value  08/20/2021 125,000 (H)  04/02/2021 38,900 (H)  11/27/2020 44,500  (H)   CD4 T Cell Abs (/uL)  Date Value  04/02/2021 443  11/27/2020 408  08/15/2020 442     Problem  List Items Addressed This Visit       High   HIV disease [facility])    He will get updated blood work today and is willing to restart Symtuza.  He tells me that his numbers will be much better when he returns in 6 weeks.       Relevant Orders   T-helper cells (CD4) count (not at [facility])   HIV-1 RNA quant-no reflex-bld   CBC   Comprehensive metabolic panel     Medium    Malignant neoplasm of bladder [facility])    Our bridge counselor has scheduled him to follow-up with Dr. Louis Meckel, his urologist.         Unprioritized   HTN (hypertension)    I will give him 2 months worth of refills of amlodipine and have asked him to work with our bridge counselor to schedule a follow-up appointment with his PCP as soon as possible.       Relevant Medications   amLODipine (NORVASC) 5 MG tablet   Asthma, chronic   Relevant Medications   albuterol (VENTOLIN HFA) 108 (90 Base) MCG/ACT inhaler   Other Visit Diagnoses     Essential hypertension       Relevant Medications   amLODipine (NORVASC) 5 MG tablet         Michel Bickers, MD [facility] for Infectious [place] [phone] pager   [phone] cell 12/31/2021, 4:59 PM

## 2021-12-31 NOTE — Assessment & Plan Note (Signed)
Our bridge counselor has scheduled him to follow-up with Dr. Louis Meckel, his urologist.

## 2022-01-02 LAB — CBC
HCT: 36.4 % — ABNORMAL LOW (ref 38.5–50.0)
Hemoglobin: 12.6 g/dL — ABNORMAL LOW (ref 13.2–17.1)
MCH: 34.1 pg — ABNORMAL HIGH (ref 27.0–33.0)
MCHC: 34.6 g/dL (ref 32.0–36.0)
MCV: 98.6 fL (ref 80.0–100.0)
MPV: 9.6 fL (ref 7.5–12.5)
Platelets: 218 10*3/uL (ref 140–400)
RBC: 3.69 10*6/uL — ABNORMAL LOW (ref 4.20–5.80)
RDW: 11.8 % (ref 11.0–15.0)
WBC: 3.7 10*3/uL — ABNORMAL LOW (ref 3.8–10.8)

## 2022-01-02 LAB — COMPREHENSIVE METABOLIC PANEL
AG Ratio: 0.5 (calc) — ABNORMAL LOW (ref 1.0–2.5)
ALT: 24 U/L (ref 9–46)
AST: 70 U/L — ABNORMAL HIGH (ref 10–35)
Albumin: 3.1 g/dL — ABNORMAL LOW (ref 3.6–5.1)
Alkaline phosphatase (APISO): 115 U/L (ref 35–144)
BUN: 14 mg/dL (ref 7–25)
CO2: 20 mmol/L (ref 20–32)
Calcium: 8.8 mg/dL (ref 8.6–10.3)
Chloride: 99 mmol/L (ref 98–110)
Creat: 1.06 mg/dL (ref 0.70–1.35)
Globulin: 6.8 g/dL (calc) — ABNORMAL HIGH (ref 1.9–3.7)
Glucose, Bld: 119 mg/dL — ABNORMAL HIGH (ref 65–99)
Potassium: 4.1 mmol/L (ref 3.5–5.3)
Sodium: 128 mmol/L — ABNORMAL LOW (ref 135–146)
Total Bilirubin: 0.3 mg/dL (ref 0.2–1.2)
Total Protein: 9.9 g/dL — ABNORMAL HIGH (ref 6.1–8.1)

## 2022-01-02 LAB — T-HELPER CELLS (CD4) COUNT (NOT AT ARMC)
CD4 % Helper T Cell: 31 % — ABNORMAL LOW (ref 33–65)
CD4 T Cell Abs: 553 /uL (ref 400–1790)

## 2022-01-02 LAB — HIV-1 RNA QUANT-NO REFLEX-BLD
HIV 1 RNA Quant: 103000 Copies/mL — ABNORMAL HIGH
HIV-1 RNA Quant, Log: 5.01 Log cps/mL — ABNORMAL HIGH

## 2022-01-09 ENCOUNTER — Other Ambulatory Visit (HOSPITAL_COMMUNITY): Payer: Self-pay

## 2022-01-10 ENCOUNTER — Other Ambulatory Visit (HOSPITAL_COMMUNITY): Payer: Self-pay

## 2022-01-14 ENCOUNTER — Telehealth: Payer: Self-pay

## 2022-01-14 ENCOUNTER — Other Ambulatory Visit (HOSPITAL_COMMUNITY): Payer: Self-pay

## 2022-01-14 NOTE — Telephone Encounter (Signed)
[name] Patient Advocate Encounter  Patient's medications have been couriered to [name] from [facility] and will be picked up .  [name] , [facility] Patient [facility] for [facility] Phone: [phone] Fax:  [phone]

## 2022-01-20 ENCOUNTER — Encounter: Payer: Self-pay | Admitting: Nurse Practitioner

## 2022-01-20 ENCOUNTER — Ambulatory Visit: Payer: 59 | Admitting: Nurse Practitioner

## 2022-01-20 ENCOUNTER — Other Ambulatory Visit (HOSPITAL_COMMUNITY): Payer: Self-pay

## 2022-01-20 ENCOUNTER — Ambulatory Visit (INDEPENDENT_AMBULATORY_CARE_PROVIDER_SITE_OTHER): Payer: 59 | Admitting: Nurse Practitioner

## 2022-01-20 VITALS — BP 157/70 | HR 73 | Temp 97.8°F | Ht 64.0 in | Wt 121.4 lb

## 2022-01-20 DIAGNOSIS — I10 Essential (primary) hypertension: Secondary | ICD-10-CM

## 2022-01-20 DIAGNOSIS — R1011 Right upper quadrant pain: Secondary | ICD-10-CM | POA: Diagnosis not present

## 2022-01-20 DIAGNOSIS — J45909 Unspecified asthma, uncomplicated: Secondary | ICD-10-CM | POA: Diagnosis not present

## 2022-01-20 DIAGNOSIS — J441 Chronic obstructive pulmonary disease with (acute) exacerbation: Secondary | ICD-10-CM | POA: Diagnosis not present

## 2022-01-20 MED ORDER — ALBUTEROL SULFATE HFA 108 (90 BASE) MCG/ACT IN AERS
2.0000 | INHALATION_SPRAY | Freq: Four times a day (QID) | RESPIRATORY_TRACT | 1 refills | Status: DC | PRN
  Filled 2022-01-20: qty 6.7, 25d supply, fill #0
  Filled 2022-03-03: qty 6.7, 25d supply, fill #1

## 2022-01-20 MED ORDER — IPRATROPIUM-ALBUTEROL 0.5-2.5 (3) MG/3ML IN SOLN
3.0000 mL | Freq: Four times a day (QID) | RESPIRATORY_TRACT | 1 refills | Status: DC | PRN
  Filled 2022-01-20: qty 360, 30d supply, fill #0
  Filled 2022-03-03: qty 360, 30d supply, fill #1

## 2022-01-20 MED ORDER — AMLODIPINE BESYLATE 5 MG PO TABS
5.0000 mg | ORAL_TABLET | Freq: Every day | ORAL | 1 refills | Status: DC
  Filled 2022-01-20: qty 30, 30d supply, fill #0
  Filled 2022-04-08: qty 30, 30d supply, fill #1

## 2022-01-20 NOTE — Patient Instructions (Signed)
1. Essential hypertension  - amLODipine (NORVASC) 5 MG tablet; Take 1 tablet (5 mg total) by mouth daily.  Dispense: 30 tablet; Refill: 1  2. Chronic asthma without complication, unspecified asthma severity, unspecified whether persistent  - albuterol (VENTOLIN HFA) 108 (90 Base) MCG/ACT inhaler; INHALE 2 PUFFS BY MOUTH INTO THE LUNGS EVERY 6 HOURS AS NEEDED FOR WHEEZING OR SHORTNESS OF BREATH  Dispense: 6.7 g; Refill: 1  3. Chronic obstructive pulmonary disease with acute exacerbation (HCC)  - ipratropium-albuterol (DUONEB) 0.5-2.5 (3) MG/3ML SOLN; Take 3 mLs by nebulization every 6 (six) hours as needed (wheezing and chest tightness).  Dispense: 360 mL; Refill: 1  4. Abdominal pain, right upper quadrant  - US Abdomen Complete; Future    Follow up:  Follow up in 3 months or sooner if needed

## 2022-01-20 NOTE — Assessment & Plan Note (Signed)
-   amLODipine (NORVASC) 5 MG tablet; Take 1 tablet (5 mg total) by mouth daily.  Dispense: 30 tablet; Refill: 1  2. Chronic asthma without complication, unspecified asthma severity, unspecified whether persistent  - albuterol (VENTOLIN HFA) 108 (90 Base) MCG/ACT inhaler; INHALE 2 PUFFS BY MOUTH INTO THE LUNGS EVERY 6 HOURS AS NEEDED FOR WHEEZING OR SHORTNESS OF BREATH  Dispense: 6.7 g; Refill: 1  3. Chronic obstructive pulmonary disease with acute exacerbation (HCC)  - ipratropium-albuterol (DUONEB) 0.5-2.5 (3) MG/3ML SOLN; Take 3 mLs by nebulization every 6 (six) hours as needed (wheezing and chest tightness).  Dispense: 360 mL; Refill: 1  4. Abdominal pain, right upper quadrant  - US Abdomen Complete; Future    Follow up:  Follow up in 3 months or sooner if needed

## 2022-01-20 NOTE — Progress Notes (Signed)
$'@Patient'v$  ID: [name], male    DOB: 07-22-1956, 66 y.o.   MRN: [id number]  Chief Complaint  Patient presents with   OFFICE VISIT    Patient stated he is here for BP medications and for his asthma. Pt stated he has sharp pain on his right side it been 2 months with this pain. Patient stated he has pain with any kind of movement. Pt is requesting refill on medications     Referring provider: Bo Merino I, NP   HPI   Patient presents today for a follow-up visit.  Overall he has been doing well.  He does need refills on his medications.  Blood pressure is slightly elevated today but patient does not have his blood pressure medicine today.  Patient does have a caseworker with him today.  Apparently he has not been taking his medications for HIV consistently male has been checking his medications weekly to make sure he is taking them.  He states that he will also check his blood pressure.  Patient does complain of some right side pain that has been going on for the past 2 months.  He states that this is intermittent but has happened.  We discussed that we will check abdominal ultrasound.  Patient will return in 3 months for complete physical with blood work. Denies f/c/s, n/v/d, hemoptysis, PND, chest pain or edema.      Allergies  Allergen Reactions   Aspirin Shortness Of Breath, Nausea And Vomiting and Swelling    Immunization History  Administered Date(s) Administered   Fluad Quad(high Dose 65+) 05/17/2019   Influenza,inj,Quad PF,6+ Mos 05/28/2018, 08/15/2020   Influenza-Unspecified 04/04/2013, 05/04/2017   PFIZER(Purple Top)SARS-COV-2 Vaccination 03/27/2020   PPD Test 03/20/2015    Past Medical History:  Diagnosis Date   Anorexia    Asthma with COPD ([place])    Bladder cancer ([place])    and prostatic urethra cancer  HX TURBT'S IN [place], [place]  WITH INSTILLATION CHEMO TX'S   Bladder tumor    History of transfusion    HIV disease ([place])    under care of [facility]  Infectious disease Dept   Hypertension    Nocturia    Vitamin D deficiency     Tobacco History: Social History   Tobacco Use  Smoking Status Some Days   Packs/day: 0.50   Years: 17.00   Total pack years: 8.50   Types: Cigarettes  Smokeless Tobacco Never  Tobacco Comments   3/day;   Ready to quit: Not Answered Counseling given: Not Answered Tobacco comments: 3/day;   Outpatient Encounter Medications as of 01/20/2022  Medication Sig   Darunavir-Cobicistat-Emtricitabine-Tenofovir Alafenamide (SYMTUZA) 800-150-200-10 MG TABS Take 1 tablet by mouth daily with breakfast.   Ensure (ENSURE) Take 1 Can by mouth 2 (two) times daily between meals.   [DISCONTINUED] albuterol (VENTOLIN HFA) 108 (90 Base) MCG/ACT inhaler INHALE 2 PUFFS BY MOUTH INTO THE LUNGS EVERY 6 HOURS AS NEEDED FOR WHEEZING OR SHORTNESS OF BREATH   [DISCONTINUED] amLODipine (NORVASC) 5 MG tablet Take 1 tablet (5 mg total) by mouth daily.   [DISCONTINUED] ipratropium-albuterol (DUONEB) 0.5-2.5 (3) MG/3ML SOLN Take 3 mLs by nebulization every 6 (six) hours as needed (wheezing and chest tightness).   albuterol (VENTOLIN HFA) 108 (90 Base) MCG/ACT inhaler INHALE 2 PUFFS BY MOUTH INTO THE LUNGS EVERY 6 HOURS AS NEEDED FOR WHEEZING OR SHORTNESS OF BREATH   amLODipine (NORVASC) 5 MG tablet Take 1 tablet (5 mg total) by mouth daily.   ipratropium-albuterol (DUONEB) 0.5-2.5 (  3) MG/3ML SOLN Inhale 3 mLs (1 vial) by nebulization every 6 (six) hours as needed (wheezing and chest tightness).   No facility-administered encounter medications on file as of 01/20/2022.     Review of Systems  Review of Systems  Constitutional: Negative.   HENT: Negative.    Cardiovascular: Negative.   Gastrointestinal:  Positive for abdominal pain.  Allergic/Immunologic: Negative.   Neurological: Negative.   Psychiatric/Behavioral: Negative.         Physical Exam  BP (!) 150/59 (BP Location: Right Arm, Patient Position: Sitting, Cuff Size:  Normal)   Pulse 76   Temp 97.8 F (36.6 C)   Ht '5\' 4"'$  (1.626 m)   Wt 121 lb 6.4 oz (55.1 kg)   SpO2 98%   BMI 20.84 kg/m   Wt Readings from Last 5 Encounters:  01/20/22 121 lb 6.4 oz (55.1 kg)  08/20/21 114 lb (51.7 kg)  04/02/21 108 lb (49 kg)  11/27/20 110 lb (49.9 kg)  08/15/20 118 lb (53.5 kg)     Physical Exam Vitals and nursing note reviewed.  Constitutional:      General: He is not in acute distress.    Appearance: He is well-developed.  Cardiovascular:     Rate and Rhythm: Normal rate and regular rhythm.  Pulmonary:     Effort: Pulmonary effort is normal.     Breath sounds: Normal breath sounds.  Abdominal:     Tenderness: There is abdominal tenderness in the right upper quadrant.  Skin:    General: Skin is warm and dry.  Neurological:     Mental Status: He is alert and oriented to person, place, and time.      Lab Results:  CBC    Component Value Date/Time   WBC 3.7 (L) 12/31/2021 1647   RBC 3.69 (L) 12/31/2021 1647   HGB 12.6 (L) 12/31/2021 1647   HGB 12.4 (L) 05/11/2019 1406   HCT 36.4 (L) 12/31/2021 1647   HCT 35.8 (L) 05/11/2019 1406   PLT 218 12/31/2021 1647   PLT 131 (L) 05/11/2019 1406   MCV 98.6 12/31/2021 1647   MCV 99 (H) 05/11/2019 1406   MCH 34.1 (H) 12/31/2021 1647   MCHC 34.6 12/31/2021 1647   RDW 11.8 12/31/2021 1647   RDW 12.6 05/11/2019 1406   LYMPHSABS 1,404 01/19/2020 1405   LYMPHSABS 1.4 05/11/2019 1406   MONOABS 352 04/01/2017 1127   EOSABS 129 01/19/2020 1405   EOSABS 0.0 05/11/2019 1406   BASOSABS 30 01/19/2020 1405   BASOSABS 0.0 05/11/2019 1406    BMET    Component Value Date/Time   NA 128 (L) 12/31/2021 1647   NA 123 (L) 05/11/2019 1406   K 4.1 12/31/2021 1647   CL 99 12/31/2021 1647   CO2 20 12/31/2021 1647   GLUCOSE 119 (H) 12/31/2021 1647   BUN 14 12/31/2021 1647   BUN 7 (L) 05/11/2019 1406   CREATININE 1.06 12/31/2021 1647   CALCIUM 8.8 12/31/2021 1647   GFRNONAA 92 01/19/2020 1405   GFRAA 107  01/19/2020 1405    BNP    Component Value Date/Time   BNP 62.5 09/10/2016 0140      Assessment & Plan:   Essential hypertension - amLODipine (NORVASC) 5 MG tablet; Take 1 tablet (5 mg total) by mouth daily.  Dispense: 30 tablet; Refill: 1  2. Chronic asthma without complication, unspecified asthma severity, unspecified whether persistent  - albuterol (VENTOLIN HFA) 108 (90 Base) MCG/ACT inhaler; INHALE 2 PUFFS BY MOUTH INTO THE  LUNGS EVERY 6 HOURS AS NEEDED FOR WHEEZING OR SHORTNESS OF BREATH  Dispense: 6.7 g; Refill: 1  3. Chronic obstructive pulmonary disease with acute exacerbation (HCC)  - ipratropium-albuterol (DUONEB) 0.5-2.5 (3) MG/3ML SOLN; Take 3 mLs by nebulization every 6 (six) hours as needed (wheezing and chest tightness).  Dispense: 360 mL; Refill: 1  4. Abdominal pain, right upper quadrant  - US Abdomen Complete; Future    Follow up:  Follow up in 3 months or sooner if needed     Fenton Foy, NP 01/20/2022

## 2022-01-22 ENCOUNTER — Other Ambulatory Visit (HOSPITAL_COMMUNITY): Payer: Self-pay

## 2022-01-22 ENCOUNTER — Other Ambulatory Visit: Payer: Self-pay

## 2022-01-22 ENCOUNTER — Telehealth: Payer: Self-pay | Admitting: Clinical

## 2022-01-22 DIAGNOSIS — B20 Human immunodeficiency virus [HIV] disease: Secondary | ICD-10-CM

## 2022-01-22 MED ORDER — SYMTUZA 800-150-200-10 MG PO TABS
1.0000 | ORAL_TABLET | Freq: Every day | ORAL | 5 refills | Status: DC
  Filled 2022-01-22 – 2022-02-05 (×2): qty 30, 30d supply, fill #0
  Filled 2022-03-03 – 2022-03-10 (×2): qty 30, 30d supply, fill #1
  Filled 2022-04-08: qty 30, 30d supply, fill #2
  Filled 2022-05-01: qty 30, 30d supply, fill #3
  Filled 2022-05-29: qty 30, 30d supply, fill #4
  Filled 2022-07-01: qty 30, 30d supply, fill #5

## 2022-01-22 NOTE — Telephone Encounter (Signed)
[facility] Case Management Referral Note  01/22/2022 Name: [name] MRN: [id number] DOB: 21-Nov-1955 [name] is a 66 y.o. year old male who sees [place], Jake Church I, NP for primary care. LCSW was consulted to assess patient's needs and assist the patient with [employer] .  Interpreter: No.   Interpreter Name & Language: none  Assessment: Patient is in need of new tubing for his nebulizer machine.  Intervention: Patient referred to CSW for assistance with this. CSW confirmed that this can be ordered through [facility]. Sent PCP message advising that they will need an order for this. Called patient, no answer and no voicemail available. Called mobile number on file and patient's nursing aid answered, advised nurses aid of the updates on the supplies. Also called patient's caseworker at [facility] and advised him of the same.  SDOH (Social Determinants of Health) assessments performed: No  Review of patient status, including review of consultants reports, relevant laboratory and other test results, and collaboration with appropriate care team members and the patient's provider was performed as part of comprehensive patient evaluation and provision of services.    Estanislado Emms, [facility] [phone]

## 2022-01-23 ENCOUNTER — Other Ambulatory Visit (HOSPITAL_COMMUNITY): Payer: Self-pay

## 2022-01-30 ENCOUNTER — Telehealth: Payer: Self-pay | Admitting: Clinical

## 2022-01-30 ENCOUNTER — Other Ambulatory Visit: Payer: Self-pay | Admitting: Nurse Practitioner

## 2022-01-30 NOTE — Telephone Encounter (Signed)
Error

## 2022-02-05 ENCOUNTER — Other Ambulatory Visit (HOSPITAL_COMMUNITY): Payer: Self-pay

## 2022-02-06 ENCOUNTER — Other Ambulatory Visit: Payer: Self-pay | Admitting: Nurse Practitioner

## 2022-02-06 ENCOUNTER — Other Ambulatory Visit (HOSPITAL_COMMUNITY): Payer: Self-pay

## 2022-02-06 ENCOUNTER — Telehealth: Payer: Self-pay

## 2022-02-06 DIAGNOSIS — J45909 Unspecified asthma, uncomplicated: Secondary | ICD-10-CM

## 2022-02-06 NOTE — Telephone Encounter (Signed)
Form faxed to [phone]

## 2022-02-06 NOTE — Telephone Encounter (Signed)
-----   Message from Fenton Foy, NP sent at 02/06/2022 10:50 AM EDT ----- Regarding: FW: nebulizer tubing  ----- Message ----- From: Estanislado Emms, Marlinda Mike Sent: 01/22/2022   9:43 AM EDT To: Fenton Foy, NP Subject: nebulizer tubing                               Hi Tonya, this patient was needing new tubing for his nebulizer. You can send an order for that to [facility] ([facility]). They said they'll run insurance and it should only be $3-4 for the patient.

## 2022-02-06 NOTE — Telephone Encounter (Signed)
DME order for nebulizer tubing completed. Will get fax number for [facility]). Could you provide the fax number to send this to ?

## 2022-02-07 ENCOUNTER — Telehealth: Payer: Self-pay

## 2022-02-07 NOTE — Telephone Encounter (Signed)
[name] Patient Advocate Encounter  Patient's medications have been couriered to [name] from [facility] and will be picked up 02/18/22.  [name] , [facility] for Infectious Disease Phone: [phone] Fax:  [phone]

## 2022-02-18 ENCOUNTER — Ambulatory Visit (INDEPENDENT_AMBULATORY_CARE_PROVIDER_SITE_OTHER): Payer: 59 | Admitting: Internal Medicine

## 2022-02-18 ENCOUNTER — Encounter: Payer: Self-pay | Admitting: Internal Medicine

## 2022-02-18 ENCOUNTER — Other Ambulatory Visit: Payer: Self-pay

## 2022-02-18 DIAGNOSIS — C679 Malignant neoplasm of bladder, unspecified: Secondary | ICD-10-CM

## 2022-02-18 DIAGNOSIS — F3342 Major depressive disorder, recurrent, in full remission: Secondary | ICD-10-CM | POA: Diagnosis not present

## 2022-02-18 DIAGNOSIS — B20 Human immunodeficiency virus [HIV] disease: Secondary | ICD-10-CM | POA: Diagnosis not present

## 2022-02-18 NOTE — Progress Notes (Signed)
Patient Active Problem List   Diagnosis Date Noted   HIV disease ([place]) 04/18/2015    Priority: High   Depression 04/19/2015    Priority: Medium    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    Priority: Medium    Malignant neoplasm of bladder ([place]) 03/30/2015    Priority: Medium    Cigarette smoker 11/27/2020   Frequent falls 07/21/2019   Unintentional weight loss 07/01/2016   Anorexia 03/11/2016   Syncope 10/31/2015   Essential hypertension 03/30/2015   Asthma, chronic 03/30/2015    Patient's Medications  New Prescriptions   No medications on file  Previous Medications   ALBUTEROL (VENTOLIN HFA) 108 (90 BASE) MCG/ACT INHALER    INHALE 2 PUFFS BY MOUTH INTO THE LUNGS EVERY 6 HOURS AS NEEDED FOR WHEEZING OR SHORTNESS OF BREATH   AMLODIPINE (NORVASC) 5 MG TABLET    Take 1 tablet (5 mg total) by mouth daily.   ATENOLOL (TENORMIN) 100 MG TABLET    Take by mouth.   DARUNAVIR-COBICISTAT-EMTRICITABINE-TENOFOVIR ALAFENAMIDE (SYMTUZA) 800-150-200-10 MG TABS    Take 1 tablet by mouth daily with breakfast.   ENSURE (ENSURE)    Take 1 Can by mouth 2 (two) times daily between meals.   IPRATROPIUM-ALBUTEROL (DUONEB) 0.5-2.5 (3) MG/3ML SOLN    Inhale 3 mLs (1 vial) by nebulization every 6 (six) hours as needed (wheezing and chest tightness).  Modified Medications   No medications on file  Discontinued Medications   No medications on file    Subjective: [name] is in for his routine HIV follow-up visit.  Our bridge counselor continues to work with him and has been seeing him about twice weekly.  He helps [name] fill out his pillbox and [name] that [name] seems to have been doing [name] taking his Symtuza.  He also seems to be feeling [name].  Her bridge counselor tells me that [name] is drinking beer.  He does now have a PCP.  He [name] that he is feeling [name] and stronger since he started taking Symtuza again.  He did see Dr. [name] recently.  He has been having dysuria and  hematuria.  It sounds like Dr. [name] may have prescribed an antibiotic but [name] that he has not gotten it from the pharmacy yet.  It also sounds like Dr. [name] plans repeat cystoscopy soon.  Review of Systems: Review of Systems  Constitutional:  Negative for chills and fever.  Genitourinary:  Positive for dysuria and hematuria. Negative for frequency and urgency.  Psychiatric/Behavioral:  Negative for depression.     Past Medical History:  Diagnosis Date   Anorexia    Asthma with COPD ([place])    Bladder cancer ([place])    and prostatic urethra cancer  HX TURBT'S IN [place], [place]  WITH INSTILLATION CHEMO TX'S   Bladder tumor    History of transfusion    HIV disease ([place])    under care of [facility]ious [facility]   Hypertension    Nocturia    Vitamin D deficiency     Social History   Tobacco Use   Smoking status: Some Days    Packs/day: 0.50    Years: 17.00    Total pack years: 8.50    Types: Cigarettes   Smokeless tobacco: Never   Tobacco comments:    3/day;  Vaping Use   Vaping Use: Never used  Substance Use Topics   Alcohol use: Yes    Alcohol/week: 6.0  standard drinks of alcohol    Types: 6 Cans of beer per week    Comment: occasional beer   Drug use: Not Currently    Types: Marijuana    Comment: occassional    Family History  Problem Relation Age of Onset   Cancer Mother        Gastric Cancer   Hypertension Mother    Heart disease Mother    Cancer Maternal Grandmother        breast cancer     Allergies  Allergen Reactions   Aspirin Shortness Of Breath, Nausea And Vomiting and Swelling    Health Maintenance  Topic Date Due   Pneumonia Vaccine 29+ Years old (1 - PCV) Never done   Zoster Vaccines- Shingrix (1 of 2) Never done   COLONOSCOPY (Pts 45-38yr Insurance coverage will need to be confirmed)  Never done   COVID-19 Vaccine (2 - Pfizer risk series) 04/17/2020   TETANUS/TDAP  04/02/2027 (Originally 10/04/1972)   INFLUENZA  VACCINE  03/04/2022   Hepatitis C Screening  Completed   HPV VACCINES  Aged Out    Objective:  Vitals:   02/18/22 1508  BP: (!) 158/67  Pulse: 73  Resp: 16  SpO2: 98%  Height: '5\' 4"'$  (1.626 m)   Body mass index is 20.84 kg/m.  Physical Exam Constitutional:      Comments: He is in good spirits.  He is seated in a wheelchair.  Cardiovascular:     Rate and Rhythm: Normal rate and regular rhythm.     Heart sounds: No murmur heard. Pulmonary:     Effort: Pulmonary effort is normal.     Breath sounds: Normal breath sounds.  Abdominal:     Palpations: Abdomen is soft.     Tenderness: There is no abdominal tenderness. There is no right CVA tenderness or left CVA tenderness.  Psychiatric:        Mood and Affect: Mood normal.     Lab Results Lab Results  Component Value Date   WBC 3.7 (L) 12/31/2021   HGB 12.6 (L) 12/31/2021   HCT 36.4 (L) 12/31/2021   MCV 98.6 12/31/2021   PLT 218 12/31/2021    Lab Results  Component Value Date   CREATININE 1.06 12/31/2021   BUN 14 12/31/2021   NA 128 (L) 12/31/2021   K 4.1 12/31/2021   CL 99 12/31/2021   CO2 20 12/31/2021    Lab Results  Component Value Date   ALT 24 12/31/2021   AST 70 (H) 12/31/2021   ALKPHOS 157 (H) 05/11/2019   BILITOT 0.3 12/31/2021    Lab Results  Component Value Date   CHOL 99 (L) 05/11/2019   HDL 71 05/11/2019   LDLCALC 17 05/11/2019   TRIG 40 05/11/2019   CHOLHDL 1.4 05/11/2019   Lab Results  Component Value Date   LABRPR NON-REACTIVE 01/19/2020   HIV 1 RNA Quant (Copies/mL)  Date Value  12/31/2021 103,000 (H)  08/20/2021 125,000 (H)  04/02/2021 38,900 (H)   CD4 T Cell Abs (/uL)  Date Value  12/31/2021 553  04/02/2021 443  11/27/2020 408     Problem List Items Addressed This Visit       High   HIV disease ([place]    His adherence has improved with a very close oversight from our bridge counselor.  He will get repeat lab work today, continue [place] follow-up in 3 months.       Relevant Orders   T-helper cells (CD4) count (  not at [facility])   HIV-1 RNA quant-no reflex-bld     Medium    Malignant neoplasm of bladder ([place])    His ongoing problems with hematuria certainly raise concern for persistence of his bladder cancer.  I talked to our bridge counselor to see if he could help get the medication prescribed by Dr. [name] for [name].      Depression    His depression is in remission.         Michel Bickers, MD [facility] for Infectious [employer] [facility] [phone] pager   [phone] cell 02/18/2022, 3:25 PM

## 2022-02-18 NOTE — Assessment & Plan Note (Signed)
His depression is in remission. 

## 2022-02-18 NOTE — Assessment & Plan Note (Signed)
His adherence has improved with a very close oversight from our bridge counselor.  He will get repeat lab work today, continue [place] and follow-up in 3 months.

## 2022-02-18 NOTE — Assessment & Plan Note (Signed)
His ongoing problems with hematuria certainly raise concern for persistence of his bladder cancer.  I talked to our bridge counselor to see if he could help get the medication prescribed by Dr. Louis Meckel for [name].

## 2022-02-19 LAB — T-HELPER CELLS (CD4) COUNT (NOT AT ARMC)
CD4 % Helper T Cell: 35 % (ref 33–65)
CD4 T Cell Abs: 405 /uL (ref 400–1790)

## 2022-02-21 ENCOUNTER — Other Ambulatory Visit (HOSPITAL_COMMUNITY): Payer: Self-pay

## 2022-02-21 LAB — HIV-1 RNA QUANT-NO REFLEX-BLD
HIV 1 RNA Quant: 109000 Copies/mL — ABNORMAL HIGH
HIV-1 RNA Quant, Log: 5.04 Log cps/mL — ABNORMAL HIGH

## 2022-02-21 MED ORDER — PHENAZOPYRIDINE HCL 200 MG PO TABS
ORAL_TABLET | ORAL | 0 refills | Status: DC
Start: 2022-02-21 — End: 2022-11-01
  Filled 2022-02-21: qty 20, 7d supply, fill #0

## 2022-02-21 MED ORDER — CIPROFLOXACIN HCL 500 MG PO TABS
ORAL_TABLET | ORAL | 0 refills | Status: DC
  Filled 2022-02-21: qty 14, 7d supply, fill #0

## 2022-02-24 ENCOUNTER — Other Ambulatory Visit (HOSPITAL_COMMUNITY): Payer: Self-pay

## 2022-03-03 ENCOUNTER — Other Ambulatory Visit (HOSPITAL_COMMUNITY): Payer: Self-pay

## 2022-03-04 ENCOUNTER — Other Ambulatory Visit (HOSPITAL_COMMUNITY): Payer: Self-pay

## 2022-03-05 ENCOUNTER — Other Ambulatory Visit (HOSPITAL_COMMUNITY): Payer: Self-pay

## 2022-03-08 ENCOUNTER — Other Ambulatory Visit (HOSPITAL_COMMUNITY): Payer: Self-pay

## 2022-03-10 ENCOUNTER — Other Ambulatory Visit (HOSPITAL_COMMUNITY): Payer: Self-pay

## 2022-03-11 ENCOUNTER — Other Ambulatory Visit (HOSPITAL_COMMUNITY): Payer: Self-pay

## 2022-04-02 ENCOUNTER — Other Ambulatory Visit (HOSPITAL_COMMUNITY): Payer: Self-pay

## 2022-04-04 ENCOUNTER — Other Ambulatory Visit (HOSPITAL_COMMUNITY): Payer: Self-pay

## 2022-04-08 ENCOUNTER — Telehealth: Payer: Self-pay | Admitting: Internal Medicine

## 2022-04-08 ENCOUNTER — Other Ambulatory Visit: Payer: Self-pay | Admitting: Nurse Practitioner

## 2022-04-08 ENCOUNTER — Other Ambulatory Visit (HOSPITAL_COMMUNITY): Payer: Self-pay

## 2022-04-08 DIAGNOSIS — J45909 Unspecified asthma, uncomplicated: Secondary | ICD-10-CM

## 2022-04-08 DIAGNOSIS — J441 Chronic obstructive pulmonary disease with (acute) exacerbation: Secondary | ICD-10-CM

## 2022-04-08 NOTE — Telephone Encounter (Signed)
I am still trying to get him to do better with his medication - but it is NOT going well.  He was not home when I stopped on Friday-  and so didn't get to check the tray then - but when I got there today - there is no way he is taking the meds at all right.  I am back to going over twice a week - and today he wasn't initially even aware of where his pill tray was - so that is not a good sign.  (to add to this - he has no idea of the days of the week - which order they go in - we go over that every time I am there - and he can never find the day of the week that we are currently on...so sad)   He cancelled going to Urology today - he has other plans.   I was on my hands and knees looking for his bag of pills - and came up with soaking wet knees - and with him the most likely things for the floor to be wet - is beer or urine - I am NOT a happy camper.   I keep telling him that unless he does much better - he needs to be in assisted living-  he does NOT want to hear that.  He was most discouraging today.    [name] [facility] [address], [place], [place] [address]

## 2022-04-09 ENCOUNTER — Other Ambulatory Visit (HOSPITAL_COMMUNITY): Payer: Self-pay

## 2022-04-23 ENCOUNTER — Other Ambulatory Visit (HOSPITAL_COMMUNITY): Payer: Self-pay

## 2022-04-23 ENCOUNTER — Ambulatory Visit (INDEPENDENT_AMBULATORY_CARE_PROVIDER_SITE_OTHER): Payer: 59 | Admitting: Nurse Practitioner

## 2022-04-23 ENCOUNTER — Encounter: Payer: Self-pay | Admitting: Nurse Practitioner

## 2022-04-23 VITALS — BP 149/60 | HR 72 | Temp 97.3°F | Ht 64.0 in | Wt 120.0 lb

## 2022-04-23 DIAGNOSIS — J4520 Mild intermittent asthma, uncomplicated: Secondary | ICD-10-CM

## 2022-04-23 DIAGNOSIS — I10 Essential (primary) hypertension: Secondary | ICD-10-CM | POA: Diagnosis not present

## 2022-04-23 DIAGNOSIS — Z0000 Encounter for general adult medical examination without abnormal findings: Secondary | ICD-10-CM | POA: Diagnosis not present

## 2022-04-23 DIAGNOSIS — J45909 Unspecified asthma, uncomplicated: Secondary | ICD-10-CM | POA: Diagnosis not present

## 2022-04-23 MED ORDER — ATENOLOL 100 MG PO TABS
100.0000 mg | ORAL_TABLET | Freq: Every day | ORAL | 2 refills | Status: AC
  Filled 2022-04-23: qty 30, 30d supply, fill #0

## 2022-04-23 MED ORDER — ALBUTEROL SULFATE HFA 108 (90 BASE) MCG/ACT IN AERS
2.0000 | INHALATION_SPRAY | Freq: Four times a day (QID) | RESPIRATORY_TRACT | 1 refills | Status: DC | PRN
  Filled 2022-04-23: qty 6.7, 25d supply, fill #0
  Filled 2022-06-01: qty 6.7, 25d supply, fill #1

## 2022-04-23 MED ORDER — AMLODIPINE BESYLATE 5 MG PO TABS
5.0000 mg | ORAL_TABLET | Freq: Every day | ORAL | 1 refills | Status: DC
  Filled 2022-04-23 – 2022-05-01 (×2): qty 30, 30d supply, fill #0

## 2022-04-23 NOTE — Progress Notes (Signed)
$'@Patient'A$  ID: [name], male    DOB: [birth year], 66 y.o.   MRN: [id number]  Chief Complaint  Patient presents with   Annual Exam    Pt is here for physical no issues questions or concerns. Pt is requesting nebulizer machine, inhaler and all medications     Referring provider: No ref. provider found   HPI  Patient presents today for a physical. Overall doing well. Does need labs and refills on medications. No new issues or concerns today. Denies f/c/s, n/v/d, hemoptysis, PND, leg swelling Denies chest pain or edema      Allergies  Allergen Reactions   Aspirin Shortness Of Breath, Nausea And Vomiting and Swelling    Immunization History  Administered Date(s) Administered   Fluad Quad(high Dose 65+) 05/17/2019   Influenza,inj,Quad PF,6+ Mos 05/28/2018, 08/15/2020   Influenza-Unspecified 04/04/2013, 05/04/2017   PFIZER(Purple Top)SARS-COV-2 Vaccination 03/27/2020   PPD Test 03/20/2015    Past Medical History:  Diagnosis Date   Anorexia    Asthma with COPD ([place])    Bladder cancer ([place])    and prostatic urethra cancer  HX TURBT'S IN [place], [place]  WITH INSTILLATION CHEMO TX'S   Bladder tumor    History of transfusion    HIV disease ([place])    under care of [facility]   Hypertension    Nocturia    Vitamin D deficiency     Tobacco History: Social History   Tobacco Use  Smoking Status Some Days   Packs/day: 0.50   Years: 17.00   Total pack years: 8.50   Types: Cigarettes  Smokeless Tobacco Never  Tobacco Comments   3/day;   Ready to quit: Not Answered Counseling given: Not Answered Tobacco comments: 3/day;   Outpatient Encounter Medications as of 04/23/2022  Medication Sig   ciprofloxacin (CIPRO) 500 MG tablet Take 1 tablet by mouth twice daily   Darunavir-Cobicistat-Emtricitabine-Tenofovir Alafenamide (SYMTUZA) 800-150-200-10 MG TABS Take 1 tablet by mouth daily with breakfast.   Ensure (ENSURE) Take 1 Can by mouth 2 (two)  times daily between meals.   ipratropium-albuterol (DUONEB) 0.5-2.5 (3) MG/3ML SOLN Inhale 3 mLs (1 vial) by nebulization every 6 (six) hours as needed (wheezing and chest tightness).   phenazopyridine (PYRIDIUM) 200 MG tablet Take 1 tablet by mouth every 8 hours as needed painful urination   [DISCONTINUED] albuterol (VENTOLIN HFA) 108 (90 Base) MCG/ACT inhaler INHALE 2 PUFFS BY MOUTH INTO THE LUNGS EVERY 6 HOURS AS NEEDED FOR WHEEZING OR SHORTNESS OF BREATH   [DISCONTINUED] amLODipine (NORVASC) 5 MG tablet Take 1 tablet (5 mg total) by mouth daily.   [DISCONTINUED] atenolol (TENORMIN) 100 MG tablet Take by mouth.   albuterol (VENTOLIN HFA) 108 (90 Base) MCG/ACT inhaler INHALE 2 PUFFS BY MOUTH INTO THE LUNGS EVERY 6 HOURS AS NEEDED FOR WHEEZING OR SHORTNESS OF BREATH   amLODipine (NORVASC) 5 MG tablet Take 1 tablet (5 mg total) by mouth daily.   atenolol (TENORMIN) 100 MG tablet Take 1 tablet (100 mg total) by mouth daily.   No facility-administered encounter medications on file as of 04/23/2022.     Review of Systems  Review of Systems  Constitutional: Negative.   HENT: Negative.    Cardiovascular: Negative.   Gastrointestinal: Negative.   Allergic/Immunologic: Negative.   Neurological: Negative.   Psychiatric/Behavioral: Negative.         Physical Exam  BP (!) 149/60 (BP Location: Right Arm, Patient Position: Sitting, Cuff Size: Normal)   Pulse 72   Temp Marland Kitchen)  97.3 F (36.3 C)   Ht '5\' 4"'$  (1.626 m)   Wt 120 lb (54.4 kg)   SpO2 100%   BMI 20.60 kg/m   Wt Readings from Last 5 Encounters:  04/23/22 120 lb (54.4 kg)  02/18/22 119 lb (54 kg)  01/20/22 121 lb 6.4 oz (55.1 kg)  08/20/21 114 lb (51.7 kg)  04/02/21 108 lb (49 kg)     Physical Exam Vitals and nursing note reviewed.  Constitutional:      General: He is not in acute distress.    Appearance: He is well-developed.  Cardiovascular:     Rate and Rhythm: Normal rate and regular rhythm.  Pulmonary:     Effort:  Pulmonary effort is normal.     Breath sounds: Normal breath sounds.  Skin:    General: Skin is warm and dry.  Neurological:     Mental Status: He is alert and oriented to person, place, and time.      Lab Results:  CBC    Component Value Date/Time   WBC 3.7 (L) 12/31/2021 1647   RBC 3.69 (L) 12/31/2021 1647   HGB 12.6 (L) 12/31/2021 1647   HGB 12.4 (L) 05/11/2019 1406   HCT 36.4 (L) 12/31/2021 1647   HCT 35.8 (L) 05/11/2019 1406   PLT 218 12/31/2021 1647   PLT 131 (L) 05/11/2019 1406   MCV 98.6 12/31/2021 1647   MCV 99 (H) 05/11/2019 1406   MCH 34.1 (H) 12/31/2021 1647   MCHC 34.6 12/31/2021 1647   RDW 11.8 12/31/2021 1647   RDW 12.6 05/11/2019 1406   LYMPHSABS 1,404 01/19/2020 1405   LYMPHSABS 1.4 05/11/2019 1406   MONOABS 352 04/01/2017 1127   EOSABS 129 01/19/2020 1405   EOSABS 0.0 05/11/2019 1406   BASOSABS 30 01/19/2020 1405   BASOSABS 0.0 05/11/2019 1406    BMET    Component Value Date/Time   NA 128 (L) 12/31/2021 1647   NA 123 (L) 05/11/2019 1406   K 4.1 12/31/2021 1647   CL 99 12/31/2021 1647   CO2 20 12/31/2021 1647   GLUCOSE 119 (H) 12/31/2021 1647   BUN 14 12/31/2021 1647   BUN 7 (L) 05/11/2019 1406   CREATININE 1.06 12/31/2021 1647   CALCIUM 8.8 12/31/2021 1647   GFRNONAA 92 01/19/2020 1405   GFRAA 107 01/19/2020 1405    BNP    Component Value Date/Time   BNP 62.5 09/10/2016 0140     Assessment & Plan:   Asthma, chronic - For home use only DME Nebulizer machine  2. Routine health maintenance  - CBC - Comprehensive metabolic panel - TSH - Lipid Panel  3. Chronic asthma without complication, unspecified asthma severity, unspecified whether persistent  - albuterol (VENTOLIN HFA) 108 (90 Base) MCG/ACT inhaler; INHALE 2 PUFFS BY MOUTH INTO THE LUNGS EVERY 6 HOURS AS NEEDED FOR WHEEZING OR SHORTNESS OF BREATH  Dispense: 6.7 g; Refill: 1  4. Essential hypertension  - atenolol (TENORMIN) 100 MG tablet; Take 1 tablet (100 mg total)  by mouth daily.  Dispense: 30 tablet; Refill: 2 - amLODipine (NORVASC) 5 MG tablet; Take 1 tablet (5 mg total) by mouth daily.  Dispense: 30 tablet; Refill: 1  Follow up:  Follow up in 3 months or sooner     Fenton Foy, NP 04/23/2022

## 2022-04-23 NOTE — Patient Instructions (Signed)
1. Mild intermittent chronic asthma without complication  - For home use only DME Nebulizer machine  2. Routine health maintenance  - CBC - Comprehensive metabolic panel - TSH - Lipid Panel  3. Chronic asthma without complication, unspecified asthma severity, unspecified whether persistent  - albuterol (VENTOLIN HFA) 108 (90 Base) MCG/ACT inhaler; INHALE 2 PUFFS BY MOUTH INTO THE LUNGS EVERY 6 HOURS AS NEEDED FOR WHEEZING OR SHORTNESS OF BREATH  Dispense: 6.7 g; Refill: 1  4. Essential hypertension  - atenolol (TENORMIN) 100 MG tablet; Take 1 tablet (100 mg total) by mouth daily.  Dispense: 30 tablet; Refill: 2 - amLODipine (NORVASC) 5 MG tablet; Take 1 tablet (5 mg total) by mouth daily.  Dispense: 30 tablet; Refill: 1  Follow up:  Follow up in 3 months or sooner

## 2022-04-23 NOTE — Assessment & Plan Note (Signed)
-   For home use only DME Nebulizer machine  2. Routine health maintenance  - CBC - Comprehensive metabolic panel - TSH - Lipid Panel  3. Chronic asthma without complication, unspecified asthma severity, unspecified whether persistent  - albuterol (VENTOLIN HFA) 108 (90 Base) MCG/ACT inhaler; INHALE 2 PUFFS BY MOUTH INTO THE LUNGS EVERY 6 HOURS AS NEEDED FOR WHEEZING OR SHORTNESS OF BREATH  Dispense: 6.7 g; Refill: 1  4. Essential hypertension  - atenolol (TENORMIN) 100 MG tablet; Take 1 tablet (100 mg total) by mouth daily.  Dispense: 30 tablet; Refill: 2 - amLODipine (NORVASC) 5 MG tablet; Take 1 tablet (5 mg total) by mouth daily.  Dispense: 30 tablet; Refill: 1  Follow up:  Follow up in 3 months or sooner

## 2022-04-23 NOTE — Addendum Note (Signed)
Addended by: Fenton Foy on: 04/23/2022 03:36 PM   Modules accepted: Level of Service

## 2022-04-24 ENCOUNTER — Other Ambulatory Visit (HOSPITAL_COMMUNITY): Payer: Self-pay

## 2022-04-24 LAB — CBC
Hematocrit: 31.6 % — ABNORMAL LOW (ref 37.5–51.0)
Hemoglobin: 10.8 g/dL — ABNORMAL LOW (ref 13.0–17.7)
MCH: 33.4 pg — ABNORMAL HIGH (ref 26.6–33.0)
MCHC: 34.2 g/dL (ref 31.5–35.7)
MCV: 98 fL — ABNORMAL HIGH (ref 79–97)
Platelets: 192 10*3/uL (ref 150–450)
RBC: 3.23 x10E6/uL — ABNORMAL LOW (ref 4.14–5.80)
RDW: 13.3 % (ref 11.6–15.4)
WBC: 3.6 10*3/uL (ref 3.4–10.8)

## 2022-04-24 LAB — COMPREHENSIVE METABOLIC PANEL
ALT: 14 IU/L (ref 0–44)
AST: 61 IU/L — ABNORMAL HIGH (ref 0–40)
Albumin/Globulin Ratio: 0.5 — ABNORMAL LOW (ref 1.2–2.2)
Albumin: 3.3 g/dL — ABNORMAL LOW (ref 3.9–4.9)
Alkaline Phosphatase: 149 IU/L — ABNORMAL HIGH (ref 44–121)
BUN/Creatinine Ratio: 12 (ref 10–24)
BUN: 12 mg/dL (ref 8–27)
Bilirubin Total: 0.3 mg/dL (ref 0.0–1.2)
CO2: 20 mmol/L (ref 20–29)
Calcium: 8.5 mg/dL — ABNORMAL LOW (ref 8.6–10.2)
Chloride: 92 mmol/L — ABNORMAL LOW (ref 96–106)
Creatinine, Ser: 1.01 mg/dL (ref 0.76–1.27)
Globulin, Total: 6.3 g/dL — ABNORMAL HIGH (ref 1.5–4.5)
Glucose: 104 mg/dL — ABNORMAL HIGH (ref 70–99)
Potassium: 4.8 mmol/L (ref 3.5–5.2)
Sodium: 122 mmol/L — ABNORMAL LOW (ref 134–144)
Total Protein: 9.6 g/dL — ABNORMAL HIGH (ref 6.0–8.5)
eGFR: 81 mL/min/{1.73_m2} (ref >59)

## 2022-04-24 LAB — LIPID PANEL
Chol/HDL Ratio: 1.7 ratio (ref 0.0–5.0)
Cholesterol, Total: 98 mg/dL — ABNORMAL LOW (ref 100–199)
HDL: 59 mg/dL (ref >39)
LDL Chol Calc (NIH): 26 mg/dL (ref 0–99)
Triglycerides: 58 mg/dL (ref 0–149)
VLDL Cholesterol Cal: 13 mg/dL (ref 5–40)

## 2022-04-24 LAB — TSH: TSH: 6.83 u[IU]/mL — ABNORMAL HIGH (ref 0.450–4.500)

## 2022-04-28 ENCOUNTER — Other Ambulatory Visit: Payer: Self-pay | Admitting: Nurse Practitioner

## 2022-04-28 ENCOUNTER — Other Ambulatory Visit (HOSPITAL_COMMUNITY): Payer: Self-pay

## 2022-04-28 DIAGNOSIS — J441 Chronic obstructive pulmonary disease with (acute) exacerbation: Secondary | ICD-10-CM

## 2022-05-01 ENCOUNTER — Other Ambulatory Visit (HOSPITAL_COMMUNITY): Payer: Self-pay

## 2022-05-01 ENCOUNTER — Other Ambulatory Visit: Payer: Self-pay | Admitting: Nurse Practitioner

## 2022-05-01 DIAGNOSIS — J441 Chronic obstructive pulmonary disease with (acute) exacerbation: Secondary | ICD-10-CM

## 2022-05-06 ENCOUNTER — Telehealth: Payer: Self-pay | Admitting: Internal Medicine

## 2022-05-06 ENCOUNTER — Encounter: Payer: Self-pay | Admitting: Nurse Practitioner

## 2022-05-06 NOTE — Telephone Encounter (Signed)
MRN: [id number]  Our friend - was supposed to go to Urology today - he stood me up yet again.  (I am kicking myself for booking an appointment on the 3rd - the day his check comes - BUT I have driven him to the bank in the past after going to an appointment - so that excuse is weak)   He is scheduled to see you on the 17th.  I am curious as to what his labs will be - and then decide next steps.  If they suck (which I fear they will) we can discuss if I do an APS report for self-neglect,  or a petition to get him a guardian...or simply to hand him to [facility] again.  (The amount of my time he has taken - with NO real results has to stop.  I have had him over a year - I am supposed to be short term - and I need to make time to try with some people who might be willing to change.)  I am at a loss what to do at this point.   Just wanted you in the loop.   Starr [facility] [address], [place], [place] [address]  Office [phone] Work cell [phone]

## 2022-05-07 ENCOUNTER — Other Ambulatory Visit (HOSPITAL_COMMUNITY): Payer: Self-pay

## 2022-05-09 ENCOUNTER — Other Ambulatory Visit: Payer: Self-pay | Admitting: Nurse Practitioner

## 2022-05-09 ENCOUNTER — Other Ambulatory Visit (HOSPITAL_COMMUNITY): Payer: Self-pay

## 2022-05-09 DIAGNOSIS — J441 Chronic obstructive pulmonary disease with (acute) exacerbation: Secondary | ICD-10-CM

## 2022-05-20 ENCOUNTER — Telehealth: Payer: Self-pay

## 2022-05-20 ENCOUNTER — Ambulatory Visit: Payer: 59 | Admitting: Internal Medicine

## 2022-05-20 NOTE — Telephone Encounter (Signed)
Attempted to call patient to reschedule missed appointment. Not able to reach him at this time. Called both numbers listed in chart, but voicemail belonged to someone else. Did not leave voicemail. Leatrice Jewels, RMA

## 2022-05-28 ENCOUNTER — Telehealth: Payer: Self-pay | Admitting: Internal Medicine

## 2022-05-28 ENCOUNTER — Other Ambulatory Visit (HOSPITAL_COMMUNITY): Payer: Self-pay

## 2022-05-28 NOTE — Telephone Encounter (Signed)
MRN: [id number] - our "crusty old guy" - who doesn't want to do anything but drink beer...he missed his urology appointment this month (I am embarrassed to call and schedule another one) and his appointment with you - with pretty flimsy excuses.  We have been talking the past couple of weeks - that he is not taking the meds properly - not going to appointments - so why am I working with him?  I gave him time to think about what he wants - and at the moment - he is thinking he doesn't need me.  I am checking with him at the end of the month - and if he still feels that way - I am closing his chart.  It was a year in August that I started with him - and we are no further along.    I told him he has the right to decide that all he wants to do, is to spend his time that he has left, sitting on the couch, watching Westerns on TV and drink beer.  That pretty much sums up where things stand.  I did give it a try.  The biggest obstacle in many ways is that he can't read and he doesn't want to do anything to adapt to that - to be more compliant with medication, etc.    [name] [name] [address], [name], [name]  Office [phone] Work cell [phone]

## 2022-05-29 ENCOUNTER — Other Ambulatory Visit (HOSPITAL_COMMUNITY): Payer: Self-pay

## 2022-05-30 ENCOUNTER — Other Ambulatory Visit (HOSPITAL_COMMUNITY): Payer: Self-pay

## 2022-06-02 ENCOUNTER — Other Ambulatory Visit (HOSPITAL_COMMUNITY): Payer: Self-pay

## 2022-06-02 ENCOUNTER — Other Ambulatory Visit: Payer: Self-pay | Admitting: Nurse Practitioner

## 2022-06-02 DIAGNOSIS — J45909 Unspecified asthma, uncomplicated: Secondary | ICD-10-CM

## 2022-06-23 ENCOUNTER — Other Ambulatory Visit (HOSPITAL_COMMUNITY): Payer: Self-pay

## 2022-06-24 ENCOUNTER — Other Ambulatory Visit (HOSPITAL_COMMUNITY): Payer: Self-pay

## 2022-06-27 ENCOUNTER — Other Ambulatory Visit (HOSPITAL_COMMUNITY): Payer: Self-pay

## 2022-07-01 ENCOUNTER — Other Ambulatory Visit (HOSPITAL_COMMUNITY): Payer: Self-pay

## 2022-07-02 ENCOUNTER — Other Ambulatory Visit (HOSPITAL_COMMUNITY): Payer: Self-pay

## 2022-07-10 ENCOUNTER — Other Ambulatory Visit (HOSPITAL_COMMUNITY): Payer: Self-pay

## 2022-07-10 ENCOUNTER — Other Ambulatory Visit: Payer: Self-pay | Admitting: Nurse Practitioner

## 2022-07-10 DIAGNOSIS — J45909 Unspecified asthma, uncomplicated: Secondary | ICD-10-CM

## 2022-07-18 ENCOUNTER — Other Ambulatory Visit (HOSPITAL_COMMUNITY): Payer: Self-pay

## 2022-07-22 ENCOUNTER — Other Ambulatory Visit (HOSPITAL_COMMUNITY): Payer: Self-pay

## 2022-07-22 ENCOUNTER — Other Ambulatory Visit: Payer: Self-pay | Admitting: Internal Medicine

## 2022-07-22 DIAGNOSIS — B20 Human immunodeficiency virus [HIV] disease: Secondary | ICD-10-CM

## 2022-07-22 MED ORDER — SYMTUZA 800-150-200-10 MG PO TABS
1.0000 | ORAL_TABLET | Freq: Every day | ORAL | 2 refills | Status: DC
  Filled 2022-07-22: qty 30, 30d supply, fill #0
  Filled 2022-08-22: qty 30, 30d supply, fill #1
  Filled 2022-09-25: qty 30, 30d supply, fill #2

## 2022-07-23 ENCOUNTER — Ambulatory Visit: Payer: Self-pay | Admitting: Nurse Practitioner

## 2022-07-30 ENCOUNTER — Other Ambulatory Visit: Payer: Self-pay

## 2022-07-31 ENCOUNTER — Other Ambulatory Visit: Payer: Self-pay

## 2022-08-11 ENCOUNTER — Other Ambulatory Visit (HOSPITAL_COMMUNITY): Payer: Self-pay

## 2022-08-15 ENCOUNTER — Other Ambulatory Visit (HOSPITAL_COMMUNITY): Payer: Self-pay

## 2022-08-18 ENCOUNTER — Other Ambulatory Visit (HOSPITAL_COMMUNITY): Payer: Self-pay

## 2022-08-21 ENCOUNTER — Other Ambulatory Visit (HOSPITAL_COMMUNITY): Payer: Self-pay

## 2022-08-22 ENCOUNTER — Other Ambulatory Visit (HOSPITAL_COMMUNITY): Payer: Self-pay

## 2022-08-29 ENCOUNTER — Other Ambulatory Visit: Payer: Self-pay

## 2022-09-23 ENCOUNTER — Other Ambulatory Visit (HOSPITAL_COMMUNITY): Payer: Self-pay

## 2022-09-25 ENCOUNTER — Other Ambulatory Visit (HOSPITAL_COMMUNITY): Payer: Self-pay

## 2022-09-25 ENCOUNTER — Telehealth: Payer: Self-pay

## 2022-09-25 NOTE — Telephone Encounter (Signed)
Patient attempted to be outreached by Junius Finner, PharmD Candidate on 09/25/2022 to discuss hypertension. Unable to leave voicemail, as all phone numbers on file go to another person.   [facility]  PharmD Candidate 2024   Maryan Puls, PharmD PGY-1 [facility] Resident

## 2022-09-26 ENCOUNTER — Other Ambulatory Visit (HOSPITAL_COMMUNITY): Payer: Self-pay

## 2022-10-20 ENCOUNTER — Other Ambulatory Visit: Payer: Self-pay

## 2022-10-22 ENCOUNTER — Other Ambulatory Visit (HOSPITAL_COMMUNITY): Payer: Self-pay

## 2022-10-22 ENCOUNTER — Other Ambulatory Visit: Payer: Self-pay

## 2022-10-22 ENCOUNTER — Other Ambulatory Visit: Payer: Self-pay | Admitting: Internal Medicine

## 2022-10-22 ENCOUNTER — Other Ambulatory Visit: Payer: Self-pay | Admitting: Nurse Practitioner

## 2022-10-22 DIAGNOSIS — B20 Human immunodeficiency virus [HIV] disease: Secondary | ICD-10-CM

## 2022-10-22 DIAGNOSIS — J45909 Unspecified asthma, uncomplicated: Secondary | ICD-10-CM

## 2022-10-22 MED ORDER — ALBUTEROL SULFATE HFA 108 (90 BASE) MCG/ACT IN AERS
2.0000 | INHALATION_SPRAY | Freq: Four times a day (QID) | RESPIRATORY_TRACT | 1 refills | Status: DC | PRN
  Filled 2022-10-22: qty 6.7, 25d supply, fill #0

## 2022-10-22 NOTE — Telephone Encounter (Signed)
Left voicemail - patient overdue for follow up.

## 2022-10-22 NOTE — Telephone Encounter (Signed)
Please advise [facility] 

## 2022-10-23 ENCOUNTER — Other Ambulatory Visit: Payer: Self-pay

## 2022-10-24 ENCOUNTER — Other Ambulatory Visit: Payer: Self-pay

## 2022-10-24 ENCOUNTER — Inpatient Hospital Stay (HOSPITAL_COMMUNITY)
Admission: EM | Admit: 2022-10-24 | Discharge: 2022-11-03 | DRG: 682 | Disposition: E | Payer: 59 | Attending: Internal Medicine | Admitting: Internal Medicine

## 2022-10-24 ENCOUNTER — Emergency Department (HOSPITAL_COMMUNITY): Payer: 59

## 2022-10-24 ENCOUNTER — Other Ambulatory Visit (HOSPITAL_COMMUNITY): Payer: Self-pay

## 2022-10-24 DIAGNOSIS — Z515 Encounter for palliative care: Secondary | ICD-10-CM | POA: Diagnosis not present

## 2022-10-24 DIAGNOSIS — F1020 Alcohol dependence, uncomplicated: Secondary | ICD-10-CM | POA: Diagnosis present

## 2022-10-24 DIAGNOSIS — C679 Malignant neoplasm of bladder, unspecified: Secondary | ICD-10-CM | POA: Diagnosis present

## 2022-10-24 DIAGNOSIS — R0603 Acute respiratory distress: Secondary | ICD-10-CM | POA: Diagnosis present

## 2022-10-24 DIAGNOSIS — G40909 Epilepsy, unspecified, not intractable, without status epilepticus: Secondary | ICD-10-CM | POA: Diagnosis present

## 2022-10-24 DIAGNOSIS — J4489 Other specified chronic obstructive pulmonary disease: Secondary | ICD-10-CM | POA: Diagnosis present

## 2022-10-24 DIAGNOSIS — E872 Acidosis, unspecified: Secondary | ICD-10-CM | POA: Diagnosis present

## 2022-10-24 DIAGNOSIS — J45909 Unspecified asthma, uncomplicated: Secondary | ICD-10-CM | POA: Diagnosis present

## 2022-10-24 DIAGNOSIS — L89322 Pressure ulcer of left buttock, stage 2: Secondary | ICD-10-CM | POA: Diagnosis present

## 2022-10-24 DIAGNOSIS — R64 Cachexia: Secondary | ICD-10-CM | POA: Diagnosis present

## 2022-10-24 DIAGNOSIS — Z681 Body mass index (BMI) 19 or less, adult: Secondary | ICD-10-CM

## 2022-10-24 DIAGNOSIS — E43 Unspecified severe protein-calorie malnutrition: Secondary | ICD-10-CM | POA: Diagnosis present

## 2022-10-24 DIAGNOSIS — N179 Acute kidney failure, unspecified: Principal | ICD-10-CM | POA: Diagnosis present

## 2022-10-24 DIAGNOSIS — L8990 Pressure ulcer of unspecified site, unspecified stage: Secondary | ICD-10-CM | POA: Diagnosis present

## 2022-10-24 DIAGNOSIS — I959 Hypotension, unspecified: Secondary | ICD-10-CM | POA: Diagnosis present

## 2022-10-24 DIAGNOSIS — I351 Nonrheumatic aortic (valve) insufficiency: Secondary | ICD-10-CM

## 2022-10-24 DIAGNOSIS — Z8559 Personal history of malignant neoplasm of other urinary tract organ: Secondary | ICD-10-CM

## 2022-10-24 DIAGNOSIS — Z800 Family history of malignant neoplasm of digestive organs: Secondary | ICD-10-CM

## 2022-10-24 DIAGNOSIS — R54 Age-related physical debility: Secondary | ICD-10-CM | POA: Diagnosis present

## 2022-10-24 DIAGNOSIS — I4729 Other ventricular tachycardia: Secondary | ICD-10-CM | POA: Insufficient documentation

## 2022-10-24 DIAGNOSIS — E860 Dehydration: Principal | ICD-10-CM | POA: Diagnosis present

## 2022-10-24 DIAGNOSIS — G9341 Metabolic encephalopathy: Secondary | ICD-10-CM | POA: Diagnosis present

## 2022-10-24 DIAGNOSIS — I214 Non-ST elevation (NSTEMI) myocardial infarction: Secondary | ICD-10-CM | POA: Diagnosis present

## 2022-10-24 DIAGNOSIS — Z803 Family history of malignant neoplasm of breast: Secondary | ICD-10-CM

## 2022-10-24 DIAGNOSIS — E876 Hypokalemia: Secondary | ICD-10-CM | POA: Diagnosis present

## 2022-10-24 DIAGNOSIS — R634 Abnormal weight loss: Secondary | ICD-10-CM | POA: Diagnosis present

## 2022-10-24 DIAGNOSIS — F17210 Nicotine dependence, cigarettes, uncomplicated: Secondary | ICD-10-CM | POA: Diagnosis present

## 2022-10-24 DIAGNOSIS — Z886 Allergy status to analgesic agent status: Secondary | ICD-10-CM

## 2022-10-24 DIAGNOSIS — J690 Pneumonitis due to inhalation of food and vomit: Secondary | ICD-10-CM | POA: Diagnosis present

## 2022-10-24 DIAGNOSIS — I472 Ventricular tachycardia, unspecified: Secondary | ICD-10-CM | POA: Diagnosis present

## 2022-10-24 DIAGNOSIS — Z5982 Transportation insecurity: Secondary | ICD-10-CM

## 2022-10-24 DIAGNOSIS — Z1152 Encounter for screening for COVID-19: Secondary | ICD-10-CM

## 2022-10-24 DIAGNOSIS — B20 Human immunodeficiency virus [HIV] disease: Secondary | ICD-10-CM | POA: Diagnosis present

## 2022-10-24 DIAGNOSIS — F32A Depression, unspecified: Secondary | ICD-10-CM | POA: Diagnosis present

## 2022-10-24 DIAGNOSIS — Z66 Do not resuscitate: Secondary | ICD-10-CM | POA: Diagnosis not present

## 2022-10-24 DIAGNOSIS — Z9221 Personal history of antineoplastic chemotherapy: Secondary | ICD-10-CM

## 2022-10-24 DIAGNOSIS — D638 Anemia in other chronic diseases classified elsewhere: Secondary | ICD-10-CM | POA: Diagnosis present

## 2022-10-24 DIAGNOSIS — F3342 Major depressive disorder, recurrent, in full remission: Secondary | ICD-10-CM | POA: Diagnosis not present

## 2022-10-24 DIAGNOSIS — I471 Supraventricular tachycardia, unspecified: Secondary | ICD-10-CM | POA: Diagnosis present

## 2022-10-24 DIAGNOSIS — I10 Essential (primary) hypertension: Secondary | ICD-10-CM | POA: Diagnosis present

## 2022-10-24 DIAGNOSIS — Z79899 Other long term (current) drug therapy: Secondary | ICD-10-CM

## 2022-10-24 DIAGNOSIS — R627 Adult failure to thrive: Secondary | ICD-10-CM | POA: Diagnosis not present

## 2022-10-24 DIAGNOSIS — Z96643 Presence of artificial hip joint, bilateral: Secondary | ICD-10-CM | POA: Diagnosis present

## 2022-10-24 DIAGNOSIS — Z5986 Financial insecurity: Secondary | ICD-10-CM

## 2022-10-24 DIAGNOSIS — Z7189 Other specified counseling: Secondary | ICD-10-CM | POA: Diagnosis not present

## 2022-10-24 DIAGNOSIS — Z8249 Family history of ischemic heart disease and other diseases of the circulatory system: Secondary | ICD-10-CM

## 2022-10-24 LAB — CBC WITH DIFFERENTIAL/PLATELET
Abs Immature Granulocytes: 0.06 10*3/uL (ref 0.00–0.07)
Basophils Absolute: 0 10*3/uL (ref 0.0–0.1)
Basophils Relative: 0 %
Eosinophils Absolute: 0 10*3/uL (ref 0.0–0.5)
Eosinophils Relative: 0 %
HCT: 38.9 % — ABNORMAL LOW (ref 39.0–52.0)
Hemoglobin: 13.2 g/dL (ref 13.0–17.0)
Immature Granulocytes: 1 %
Lymphocytes Relative: 12 %
Lymphs Abs: 1 10*3/uL (ref 0.7–4.0)
MCH: 34.6 pg — ABNORMAL HIGH (ref 26.0–34.0)
MCHC: 33.9 g/dL (ref 30.0–36.0)
MCV: 102.1 fL — ABNORMAL HIGH (ref 80.0–100.0)
Monocytes Absolute: 0.6 10*3/uL (ref 0.1–1.0)
Monocytes Relative: 7 %
Neutro Abs: 7 10*3/uL (ref 1.7–7.7)
Neutrophils Relative %: 80 %
Platelets: 234 10*3/uL (ref 150–400)
RBC: 3.81 MIL/uL — ABNORMAL LOW (ref 4.22–5.81)
RDW: 13.9 % (ref 11.5–15.5)
WBC: 8.7 10*3/uL (ref 4.0–10.5)
nRBC: 0 % (ref 0.0–0.2)

## 2022-10-24 LAB — COMPREHENSIVE METABOLIC PANEL
ALT: 30 U/L (ref 0–44)
AST: 63 U/L — ABNORMAL HIGH (ref 15–41)
Albumin: 2.7 g/dL — ABNORMAL LOW (ref 3.5–5.0)
Alkaline Phosphatase: 80 U/L (ref 38–126)
Anion gap: 18 — ABNORMAL HIGH (ref 5–15)
BUN: 102 mg/dL — ABNORMAL HIGH (ref 8–23)
CO2: 14 mmol/L — ABNORMAL LOW (ref 22–32)
Calcium: 9.4 mg/dL (ref 8.9–10.3)
Chloride: 102 mmol/L (ref 98–111)
Creatinine, Ser: 5.78 mg/dL — ABNORMAL HIGH (ref 0.61–1.24)
GFR, Estimated: 10 mL/min — ABNORMAL LOW (ref >60)
Glucose, Bld: 130 mg/dL — ABNORMAL HIGH (ref 70–99)
Potassium: 4.8 mmol/L (ref 3.5–5.1)
Sodium: 134 mmol/L — ABNORMAL LOW (ref 135–145)
Total Bilirubin: 1 mg/dL (ref 0.3–1.2)
Total Protein: 11.2 g/dL — ABNORMAL HIGH (ref 6.5–8.1)

## 2022-10-24 LAB — I-STAT VENOUS BLOOD GAS, ED
Acid-base deficit: 10 mmol/L — ABNORMAL HIGH (ref 0.0–2.0)
Bicarbonate: 15.4 mmol/L — ABNORMAL LOW (ref 20.0–28.0)
Calcium, Ion: 1.16 mmol/L (ref 1.15–1.40)
HCT: 39 % (ref 39.0–52.0)
Hemoglobin: 13.3 g/dL (ref 13.0–17.0)
O2 Saturation: 26 %
Potassium: 5.1 mmol/L (ref 3.5–5.1)
Sodium: 139 mmol/L (ref 135–145)
TCO2: 16 mmol/L — ABNORMAL LOW (ref 22–32)
pCO2, Ven: 33.1 mmHg — ABNORMAL LOW (ref 44–60)
pH, Ven: 7.277 (ref 7.25–7.43)
pO2, Ven: 20 mmHg — CL (ref 32–45)

## 2022-10-24 LAB — ETHANOL: Alcohol, Ethyl (B): <10 mg/dL (ref <10)

## 2022-10-24 LAB — CBG MONITORING, ED: Glucose-Capillary: 132 mg/dL — ABNORMAL HIGH (ref 70–99)

## 2022-10-24 MED ORDER — PHENAZOPYRIDINE HCL 100 MG PO TABS: 200.0000 mg | ORAL_TABLET | Freq: Three times a day (TID) | ORAL | Status: DC

## 2022-10-24 MED ORDER — ALBUTEROL SULFATE (2.5 MG/3ML) 0.083% IN NEBU: 3.0000 mL | INHALATION_SOLUTION | Freq: Four times a day (QID) | RESPIRATORY_TRACT | Status: DC | PRN

## 2022-10-24 MED ORDER — DARUN-COBIC-EMTRICIT-TENOFAF 800-150-200-10 MG PO TABS
1.0000 | ORAL_TABLET | Freq: Every day | ORAL | Status: DC
  Administered 2022-10-26 – 2022-10-28 (×3): 1 via ORAL
  Filled 2022-10-24 (×6): qty 1

## 2022-10-24 MED ORDER — LACTATED RINGERS IV BOLUS
1000.0000 mL | Freq: Once | INTRAVENOUS | Status: AC
  Administered 2022-10-24: 1000 mL via INTRAVENOUS

## 2022-10-24 MED ORDER — IPRATROPIUM-ALBUTEROL 0.5-2.5 (3) MG/3ML IN SOLN: 3.0000 mL | Freq: Four times a day (QID) | RESPIRATORY_TRACT | Status: DC | PRN

## 2022-10-24 MED ORDER — NEPRO/CARBSTEADY PO LIQD: 237.0000 mL | Freq: Three times a day (TID) | ORAL | Status: DC | PRN

## 2022-10-24 MED ORDER — DEXTROSE-NACL 5-0.9 % IV SOLN: INTRAVENOUS | Status: DC

## 2022-10-24 MED ORDER — DOCUSATE SODIUM 283 MG RE ENEM: 1.0000 | ENEMA | RECTAL | Status: DC | PRN

## 2022-10-24 MED ORDER — ACETAMINOPHEN 650 MG RE SUPP: 650.0000 mg | Freq: Four times a day (QID) | RECTAL | Status: DC | PRN

## 2022-10-24 MED ORDER — AMLODIPINE BESYLATE 5 MG PO TABS
5.0000 mg | ORAL_TABLET | Freq: Every day | ORAL | Status: DC
  Filled 2022-10-24: qty 1

## 2022-10-24 MED ORDER — ONDANSETRON HCL 4 MG PO TABS: 4.0000 mg | ORAL_TABLET | Freq: Four times a day (QID) | ORAL | Status: DC | PRN

## 2022-10-24 MED ORDER — SYMTUZA 800-150-200-10 MG PO TABS
1.0000 | ORAL_TABLET | Freq: Every day | ORAL | 0 refills | Status: DC
  Filled 2022-10-24: qty 30, 30d supply, fill #0

## 2022-10-24 MED ORDER — CALCIUM CARBONATE ANTACID 1250 MG/5ML PO SUSP: 500.0000 mg | Freq: Four times a day (QID) | ORAL | Status: DC | PRN

## 2022-10-24 MED ORDER — ENOXAPARIN SODIUM 30 MG/0.3ML IJ SOSY
30.0000 mg | PREFILLED_SYRINGE | INTRAMUSCULAR | Status: DC
  Administered 2022-10-25 – 2022-10-26 (×2): 30 mg via SUBCUTANEOUS
  Filled 2022-10-24 (×2): qty 0.3

## 2022-10-24 MED ORDER — ZOLPIDEM TARTRATE 5 MG PO TABS
5.0000 mg | ORAL_TABLET | Freq: Every evening | ORAL | Status: DC | PRN
  Administered 2022-10-25: 5 mg via ORAL
  Filled 2022-10-24: qty 1

## 2022-10-24 MED ORDER — ONDANSETRON HCL 4 MG/2ML IJ SOLN
4.0000 mg | Freq: Four times a day (QID) | INTRAMUSCULAR | Status: DC | PRN
  Administered 2022-10-25 – 2022-10-29 (×2): 4 mg via INTRAVENOUS
  Filled 2022-10-24 (×3): qty 2

## 2022-10-24 MED ORDER — HYDROXYZINE HCL 25 MG PO TABS
25.0000 mg | ORAL_TABLET | Freq: Three times a day (TID) | ORAL | Status: DC | PRN
  Filled 2022-10-24: qty 1

## 2022-10-24 MED ORDER — SORBITOL 70 % SOLN: 30.0000 mL | Status: DC | PRN

## 2022-10-24 MED ORDER — CAMPHOR-MENTHOL 0.5-0.5 % EX LOTN: 1.0000 | TOPICAL_LOTION | Freq: Three times a day (TID) | CUTANEOUS | Status: DC | PRN

## 2022-10-24 MED ORDER — ACETAMINOPHEN 325 MG PO TABS: 650.0000 mg | ORAL_TABLET | Freq: Four times a day (QID) | ORAL | Status: DC | PRN

## 2022-10-24 MED ORDER — LACTATED RINGERS IV BOLUS
1000.0000 mL | Freq: Once | INTRAVENOUS | Status: AC
  Administered 2022-10-25: 1000 mL via INTRAVENOUS

## 2022-10-24 NOTE — ED Notes (Signed)
Pt gone to ultrasound

## 2022-10-24 NOTE — ED Notes (Signed)
Sister Kamani Rutenberg [phone] would like an update asap

## 2022-10-24 NOTE — ED Notes (Signed)
ED TO INPATIENT HANDOFF REPORT  ED Nurse Name and Phone #: Lynett Brasil/ [phone]  S Name/Age/Gender [name] 67 y.o. male Room/Bed: 005C/005C  Code Status   Code Status: Full Code  Home/SNF/Other Home Patient oriented to: self, place, and situation Is this baseline? Yes   Triage Complete: Triage complete  Chief Complaint AKI (acute kidney injury) ([place]) [N17.9]  Triage Note Pt BIB [name] with c/o of AMS. A neighbor called for him because he wasn't acting like himself. Pt smelled of urine and their were urine bottles everywhere. Urine is dark and odorous. Pt has had urinary retention for 5 days.   EMS VS BP systolic palpated at [id number] P 110 R 24 O2 3L couldn't get an oxygen reading  CBG 166   Allergies Allergies  Allergen Reactions   Aspirin Shortness Of Breath, Nausea And Vomiting and Swelling    Level of Care/Admitting Diagnosis ED Disposition     ED Disposition  Admit   Condition  --   Comment  Hospital Area: [facility] [[id number]]  Level of Care: Telemetry Medical [104]  May admit patient to [facility] or [facility] if equivalent level of care is available:: No  Covid Evaluation: Asymptomatic - no recent exposure (last 10 days) testing not required  [facility]: [id number]  Admitting Physician: [name] [[id number]]  Attending Physician: [name] [id number]  Certification:: I certify this patient will need inpatient services for at least 2 midnights  Estimated Length of Stay: 4          B Medical/Surgery History Past Medical History:  Diagnosis Date   Anorexia    Asthma with COPD ([place])    Bladder cancer ([place])    and prostatic urethra cancer  HX TURBT'S IN [place], [place]  WITH INSTILLATION CHEMO TX'S   Bladder tumor    History of transfusion    HIV disease ([place])    under care of [facility]   Hypertension    Nocturia    Vitamin D deficiency    Past Surgical  History:  Procedure Laterality Date   CATARACT EXTRACTION W/ INTRAOCULAR LENS  IMPLANT, BILATERAL  2014   FULGURATION OF BLADDER TUMOR Bilateral 05/31/2014   Procedure: [place];  Surgeon: Ardis Hughs, MD;  [facility];  Service: Urology;  Laterality: Bilateral;   JOINT REPLACEMENT     LUMBAR DISC SURGERY  x2  last one 2008   TOTAL HIP ARTHROPLASTY Bilateral x each with revision's   last one for Right 2012/  last one for Left 2010   TRANSURETHRAL RESECTION OF BLADDER TUMOR  X3   last one 02/ 2015 ([place], [place])   TRANSURETHRAL RESECTION OF BLADDER TUMOR WITH GYRUS (TURBT-GYRUS) Bilateral 05/16/2015   Procedure: TRANSURETHRAL RESECTION OF BLADDER TUMOR WITH GYRUS (TURBT-GYRUS), bilateral retrograde pyelogram;  Surgeon: Ardis Hughs, MD;  Location: [facility];  Service: Urology;  Laterality: Bilateral;   TRANSURETHRAL RESECTION OF BLADDER TUMOR WITH GYRUS (TURBT-GYRUS) N/A 07/05/2015   Procedure: RE RESECTION OF BLADDER TUMOR (TURBT) RIGHT BLADDER NECK;  Surgeon: Ardis Hughs, MD;  Location: [facility];  Service: Urology;  Laterality: N/A;     A IV Location/Drains/Wounds Patient Lines/Drains/Airways Status     Active Line/Drains/Airways     Name Placement date Placement time Site Days   Peripheral IV 11/01/2022 20 G Right Antecubital 10/20/2022  1957  Antecubital  less than 1  Peripheral IV 10/05/2022 20 G Anterior;Right Forearm 10/03/2022  2330  Forearm  less than 1            Intake/Output Last 24 hours No intake or output data in the 24 hours ending 10/29/2022 2338  Labs/Imaging Results for orders placed or performed during the hospital encounter of 10/03/2022 (from the past 48 hour(s))  Comprehensive metabolic panel     Status: Abnormal   Collection Time: 10/12/2022  8:16 PM  Result Value Ref Range   Sodium 134 (L) 135 - 145 mmol/L   Potassium 4.8 3.5 - 5.1 mmol/L   Chloride 102 98 - 111 mmol/L    CO2 14 (L) 22 - 32 mmol/L   Glucose, Bld 130 (H) 70 - 99 mg/dL    Comment: Glucose reference range applies only to samples taken after fasting for at least 8 hours.   BUN 102 (H) 8 - 23 mg/dL   Creatinine, Ser 5.78 (H) 0.61 - 1.24 mg/dL   Calcium 9.4 8.9 - 10.3 mg/dL   Total Protein 11.2 (H) 6.5 - 8.1 g/dL   Albumin 2.7 (L) 3.5 - 5.0 g/dL   AST 63 (H) 15 - 41 U/L   ALT 30 0 - 44 U/L   Alkaline Phosphatase 80 38 - 126 U/L   Total Bilirubin 1.0 0.3 - 1.2 mg/dL   GFR, Estimated 10 (L) >60 mL/min    Comment: (NOTE) Calculated using the CKD-EPI Creatinine Equation (2021)    Anion gap 18 (H) 5 - 15    Comment: Electrolytes repeated to confirm. Performed at [facility], [place] [address] [place], [place] [address]   CBC with Differential/Platelet     Status: Abnormal   Collection Time: 10/15/2022  8:16 PM  Result Value Ref [place]   WBC 8.7 4.0 - 10.5 K/uL   RBC 3.81 (L) 4.22 - 5.81 MIL/uL   Hemoglobin 13.2 13.0 - 17.0 g/dL   HCT 38.9 (L) 39.0 - 52.0 %   MCV 102.1 (H) 80.0 - 100.0 fL   MCH 34.6 (H) 26.0 - 34.0 pg   MCHC 33.9 30.0 - 36.0 g/dL   RDW 13.9 11.5 - 15.5 %   Platelets 234 150 - 400 K/uL   nRBC 0.0 0.0 - 0.2 %   Neutrophils Relative % 80 %   Neutro Abs 7.0 1.7 - 7.7 K/uL   Lymphocytes Relative 12 %   Lymphs Abs 1.0 0.7 - 4.0 K/uL   Monocytes Relative 7 %   Monocytes Absolute 0.6 0.1 - 1.0 K/uL   Eosinophils Relative 0 %   Eosinophils Absolute 0.0 0.0 - 0.5 K/uL   Basophils Relative 0 %   Basophils Absolute 0.0 0.0 - 0.1 K/uL   Immature Granulocytes 1 %   Abs Immature Granulocytes 0.06 0.00 - 0.07 K/uL    Comment: Performed at [facility] [address] [place], [place] [address]  Ethanol     Status: None   Collection Time: 10/21/2022  8:16 PM  Result Value Ref Range   Alcohol, Ethyl (B) <10 <10 mg/dL    Comment: (NOTE) Lowest detectable limit for serum alcohol is 10 mg/dL.  For medical purposes only. Performed at [facility], [place] [address] [place], [place] [address]   I-Stat venous blood gas, ED     Status: Abnormal   Collection Time: 10/17/2022  8:21 PM  Result Value Ref Range   pH, Ven 7.277 7.25 - 7.43   pCO2, Ven 33.1 (L)  44 - 60 mmHg   pO2, Ven 20 (LL) 32 - 45 mmHg   Bicarbonate 15.4 (L) 20.0 - 28.0 mmol/L   TCO2 16 (L) 22 - 32 mmol/L   O2 Saturation 26 %   Acid-base deficit 10.0 (H) 0.0 - 2.0 mmol/L   Sodium 139 135 - 145 mmol/L   Potassium 5.1 3.5 - 5.1 mmol/L   Calcium, Ion 1.16 1.15 - 1.40 mmol/L   HCT 39.0 39.0 - 52.0 %   Hemoglobin 13.3 13.0 - 17.0 g/dL   Sample type VENOUS    Comment NOTIFIED PHYSICIAN   CBG monitoring, ED     Status: Abnormal   Collection Time: 10/05/2022 10:51 PM  Result Value Ref Range   Glucose-Capillary 132 (H) 70 - 99 mg/dL    Comment: Glucose reference range applies only to samples taken after fasting for at least 8 hours.   DG Chest 2 View  Result Date: 10/17/2022 CLINICAL DATA:  Cough. EXAM: CHEST - 2 VIEW COMPARISON:  02/22/2018 FINDINGS: Heart size and pulmonary vascularity are normal. Pulmonary hyperinflation likely representing emphysematous change. Scattered fibrosis in the lungs. No consolidation or airspace disease. No pleural effusions. No pneumothorax. Degenerative changes in the spine. IMPRESSION: Emphysematous changes in the lungs. No evidence of active pulmonary disease. Electronically Signed   By: Lucienne Capers M.D.   On: 10/29/2022 21:46   CT HEAD WO CONTRAST  Result Date: 10/20/2022 CLINICAL DATA:  Altered mental status EXAM: CT HEAD WITHOUT CONTRAST TECHNIQUE: Contiguous axial images were obtained from the base of the skull through the vertex without intravenous contrast. RADIATION DOSE REDUCTION: This exam was performed according to the departmental dose-optimization program which includes automated exposure control, adjustment of the mA and/or kV according to patient size and/or use of iterative reconstruction technique. COMPARISON:  None Available. FINDINGS: Brain:  Normal anatomic configuration. Moderate parenchymal volume loss is slightly greater than would be typically expected for age. Mild periventricular white matter changes are present likely reflecting the sequela of small vessel ischemia. No abnormal intra or extra-axial mass lesion or fluid collection. No abnormal mass effect or midline shift. No evidence of acute intracranial hemorrhage or infarct. Mild ventriculomegaly appears commensurate with the degree of parenchymal volume loss and likely reflects the sequela of central atrophy. Cerebellum unremarkable. Vascular: No asymmetric hyperdense vasculature at the skull base. Skull: Intact Sinuses/Orbits: Postsurgical changes within the visualized paranasal sinuses with mild mucosal thickening noted within the residual ethmoid air cells and left maxillary sinus. No air-fluid levels. Orbits are unremarkable. Other: Mastoid air cells and middle ear cavities are clear. IMPRESSION: 1. No acute intracranial hemorrhage or infarct. 2. Moderate parenchymal volume loss, slightly greater than would be typically expected for age. 3. Mild periventricular white matter changes likely reflecting the sequela of small vessel ischemia. Electronically Signed   By: Fidela Salisbury M.D.   On: 10/15/2022 20:57    Pending Labs Unresulted Labs (From admission, onward)     Start     Ordered   10/19/2022 0500  Creatinine, serum  (enoxaparin (LOVENOX)    CrCl >/= 30 ml/min)  Weekly,   R     Comments: while on enoxaparin therapy    10/23/2022 2310   10/25/22 0500  Renal function panel  Tomorrow [name],   R        10/27/2022 2310   10/25/22 0500  CBC  Tomorrow [name],   R        10/25/2022 2310   11/01/2022 2302  CBC  (enoxaparin (LOVENOX)  CrCl >/= 30 ml/min)  Once,   R       Comments: Baseline for enoxaparin therapy IF NOT ALREADY DRAWN.  Notify MD if PLT < 100 K.    10/16/2022 2310   10/19/2022 2302  Creatinine, serum  (enoxaparin (LOVENOX)    CrCl >/= 30 ml/min)  Once,   R        Comments: Baseline for enoxaparin therapy IF NOT ALREADY DRAWN.    10/28/2022 2310   10/30/2022 2121  Resp panel by RT-PCR (RSV, Flu A&B, Covid) Anterior Nasal Swab  (Tier 2 - SymptomaticResp panel by RT-PCR (RSV, Flu A&B, Covid))  Once,   URGENT        10/06/2022 2120   10/21/2022 1930  Urinalysis, Routine w reflex microscopic -Urine, Clean Catch  Once,   URGENT       Question:  Specimen Source  Answer:  Urine, Clean Catch   10/05/2022 1930   10/23/2022 1930  Rapid urine drug screen (hospital performed)  Once,   STAT        10/30/2022 1930   10/03/2022 1930  Blood gas, venous  Once,   R        10/19/2022 1930            Vitals/Pain Today's Vitals   10/12/2022 1939 10/14/2022 2030 10/05/2022 2100 10/10/2022 2215  BP:  132/81  136/79  Pulse:   (!) 115 100  Resp:  (!) 24 (!) 24 19  Temp: 98.5 F (36.9 C)     TempSrc: Rectal     SpO2:    98%  Weight:      Height:      PainSc:        Isolation Precautions Airborne and Contact precautions  Medications Medications  lactated ringers bolus 1,000 mL (has no administration in time range)  Darunavir-Cobicistat-Emtricitabine-Tenofovir Alafenamide (SYMTUZA) 800-150-200-10 MG TABS 1 tablet (has no administration in time range)  amLODipine (NORVASC) tablet 5 mg (has no administration in time range)  phenazopyridine (PYRIDIUM) tablet 200 mg (has no administration in time range)  albuterol (VENTOLIN HFA) 108 (90 Base) MCG/ACT inhaler 2 puff (has no administration in time range)  ipratropium-albuterol (DUONEB) 0.5-2.5 (3) MG/3ML nebulizer solution 3 mL (has no administration in time range)  acetaminophen (TYLENOL) tablet 650 mg (has no administration in time range)    Or  acetaminophen (TYLENOL) suppository 650 mg (has no administration in time range)  zolpidem (AMBIEN) tablet 5 mg (has no administration in time range)  camphor-menthol (SARNA) lotion 1 Application (has no administration in time range)    And  hydrOXYzine (ATARAX) tablet 25 mg (has no  administration in time range)  sorbitol 70 % solution 30 mL (has no administration in time range)  docusate sodium (ENEMEEZ) enema 283 mg (has no administration in time range)  ondansetron (ZOFRAN) tablet 4 mg (has no administration in time range)    Or  ondansetron (ZOFRAN) injection 4 mg (has no administration in time range)  calcium carbonate (dosed in mg elemental calcium) suspension 500 mg of elemental calcium (has no administration in time range)  feeding supplement (NEPRO CARB STEADY) liquid 237 mL (has no administration in time range)  enoxaparin (LOVENOX) injection 40 mg (has no administration in time range)  dextrose 5 %-0.9 % sodium chloride infusion (has no administration in time range)  lactated ringers bolus 1,000 mL (1,000 mLs Intravenous New Bag/Given 10/09/2022 2255)    Mobility Unsure if he walks with an assistive device or not... pt lives  alone at home...     Focused Assessments     R Recommendations: See Admitting Provider Note  Report given to:   Additional Notes: Pt came from home and his neighbor was concerned about him so he called 911 for him. Pt is being admitted for AKI. We bladder scanned him earlier and he had 7cc in there so he has not peed even though he feels the urge. He has a 20 G in R FA. He's getting an LR bolus and he's due for another LR bolus. He's getting an ultrasound before he comes up.

## 2022-10-24 NOTE — ED Notes (Signed)
Pt gone to Xray 

## 2022-10-24 NOTE — ED Provider Notes (Signed)
[facility] Provider Note   CSN: [id number] Arrival date & time: 10/24/22  1909     History {Add pertinent medical, surgical, social history, OB history to HPI:1} Chief Complaint  Patient presents with   Altered Mental Status    [name] is a 67 y.o. male.  67 year old male with a history of COPD, HIV, hypertension, bladder tumor, and alcohol use who presents to the emergency department with altered mental status.  Per EMS, neighbor called when patient was not acting like himself.  Smelled of urine and stated that the patient has been complaining of urinary retention for 5 days.  On exam patient is alert and oriented to self, place, and year but is only able to provide limited history.  Says that he has had a cough and shortness of breath recently.  Says that he is having some pain in his lower abdomen and near his penis.  Says that he does drink alcohol and last drink several days ago.  When asked how much daily he says "not that much".       Home Medications Prior to Admission medications   Medication Sig Start Date End Date Taking? Authorizing Provider  albuterol (VENTOLIN HFA) 108 (90 Base) MCG/ACT inhaler INHALE 2 PUFFS BY MOUTH INTO THE LUNGS EVERY 6 HOURS AS NEEDED FOR WHEEZING OR SHORTNESS OF BREATH 10/22/22 10/22/23  Fenton Foy, NP  amLODipine (NORVASC) 5 MG tablet Take 1 tablet (5 mg total) by mouth daily. 04/23/22   Fenton Foy, NP  atenolol (TENORMIN) 100 MG tablet Take 1 tablet (100 mg total) by mouth daily. 04/23/22 07/22/22  Fenton Foy, NP  ciprofloxacin (CIPRO) 500 MG tablet Take 1 tablet by mouth twice daily 02/21/22     Darunavir-Cobicistat-Emtricitabine-Tenofovir Alafenamide (SYMTUZA) 800-150-200-10 MG TABS Take 1 tablet by mouth daily with breakfast. 10/24/22   Michel Bickers, MD  Ensure (ENSURE) Take 1 Can by mouth 2 (two) times daily between meals. 08/22/20   Michel Bickers, MD  ipratropium-albuterol  (DUONEB) 0.5-2.5 (3) MG/3ML SOLN Inhale 3 mLs (1 vial) by nebulization every 6 (six) hours as needed (wheezing and chest tightness). 01/20/22   Fenton Foy, NP  phenazopyridine (PYRIDIUM) 200 MG tablet Take 1 tablet by mouth every 8 hours as needed painful urination 02/21/22         Allergies    Aspirin    Review of Systems   Review of Systems  Physical Exam Updated Vital Signs BP 132/81   Pulse (!) 115   Temp 98.5 F (36.9 C) (Rectal)   Resp (!) 24   Ht 5\' 4"  (1.626 m)   Wt 58.5 kg   SpO2 97%   BMI 22.14 kg/m  Physical Exam Vitals and nursing note reviewed.  Constitutional:      General: He is not in acute distress.    Appearance: He is well-developed.     Comments: Cachectic appearing  HENT:     Head: Normocephalic and atraumatic.     Right Ear: External ear normal.     Left Ear: External ear normal.     Nose: Nose normal.  Eyes:     Extraocular Movements: Extraocular movements intact.     Conjunctiva/sclera: Conjunctivae normal.     Pupils: Pupils are equal, round, and reactive to light.  Cardiovascular:     Rate and Rhythm: Regular rhythm. Tachycardia present.     Heart sounds: Normal heart sounds.  Pulmonary:     Effort: Pulmonary  effort is normal. No respiratory distress.     Breath sounds: Normal breath sounds.  Abdominal:     General: There is no distension.     Palpations: Abdomen is soft. There is no mass.     Tenderness: There is abdominal tenderness (Suprapubic). There is no guarding.  Genitourinary:    Penis: Normal.      Testes: Normal.  Musculoskeletal:     Cervical back: Normal range of motion and neck supple.     Right lower leg: No edema.     Left lower leg: No edema.  Skin:    General: Skin is warm and dry.  Neurological:     Mental Status: He is alert.     Comments: Unclear baseline.  Alert and oriented to self and the fact that it was March but did not know the year.  Moving all 4 extremities equally.  Cranial nerves II through XII  grossly intact.  Psychiatric:        Mood and Affect: Mood normal.        Behavior: Behavior normal.     ED Results / Procedures / Treatments   Labs (all labs ordered are listed, but only abnormal results are displayed) Labs Reviewed  CBC WITH DIFFERENTIAL/PLATELET - Abnormal; Notable for the following components:      Result Value   RBC 3.81 (*)    HCT 38.9 (*)    MCV 102.1 (*)    MCH 34.6 (*)    All other components within normal limits  I-STAT VENOUS BLOOD GAS, ED - Abnormal; Notable for the following components:   pCO2, Ven 33.1 (*)    pO2, Ven 20 (*)    Bicarbonate 15.4 (*)    TCO2 16 (*)    Acid-base deficit 10.0 (*)    All other components within normal limits  COMPREHENSIVE METABOLIC PANEL  URINALYSIS, ROUTINE W REFLEX MICROSCOPIC  RAPID URINE DRUG SCREEN, HOSP PERFORMED  ETHANOL  BLOOD GAS, VENOUS  CBG MONITORING, ED    EKG None  Radiology CT HEAD WO CONTRAST  Result Date: 10/24/2022 CLINICAL DATA:  Altered mental status EXAM: CT HEAD WITHOUT CONTRAST TECHNIQUE: Contiguous axial images were obtained from the base of the skull through the vertex without intravenous contrast. RADIATION DOSE REDUCTION: This exam was performed according to the departmental dose-optimization program which includes automated exposure control, adjustment of the mA and/or kV according to patient size and/or use of iterative reconstruction technique. COMPARISON:  None Available. FINDINGS: Brain: Normal anatomic configuration. Moderate parenchymal volume loss is slightly greater than would be typically expected for age. Mild periventricular white matter changes are present likely reflecting the sequela of small vessel ischemia. No abnormal intra or extra-axial mass lesion or fluid collection. No abnormal mass effect or midline shift. No evidence of acute intracranial hemorrhage or infarct. Mild ventriculomegaly appears commensurate with the degree of parenchymal volume loss and likely reflects  the sequela of central atrophy. Cerebellum unremarkable. Vascular: No asymmetric hyperdense vasculature at the skull base. Skull: Intact Sinuses/Orbits: Postsurgical changes within the visualized paranasal sinuses with mild mucosal thickening noted within the residual ethmoid air cells and left maxillary sinus. No air-fluid levels. Orbits are unremarkable. Other: Mastoid air cells and middle ear cavities are clear. IMPRESSION: 1. No acute intracranial hemorrhage or infarct. 2. Moderate parenchymal volume loss, slightly greater than would be typically expected for age. 3. Mild periventricular white matter changes likely reflecting the sequela of small vessel ischemia. Electronically Signed   By: Fidela Salisbury  M.D.   On: 10/24/2022 20:57    Procedures Procedures   Medications Ordered in ED Medications - No data to display  ED Course/ Medical Decision Making/ A&P Clinical Course as of 10/24/22 2111  Fri Oct 24, 2022  1951 Bladder scan with 29ml of urine. [RP]    Clinical Course User Index [RP] Fransico Meadow, MD                             Medical Decision Making Amount and/or Complexity of Data Reviewed Labs: ordered. Radiology: ordered.   [name] is a 67 y.o. male with comorbidities that complicate the patient evaluation including ***   Initial Ddx:  ***   MDM:  ***  Plan:  ***  ED Summary/Re-evaluation:  ***  This patient presents to the ED for concern of complaints listed in HPI, this involves an extensive number of treatment options, and is a complaint that carries with it a high risk of complications and morbidity. Disposition including potential need for admission considered.   Dispo: {Disposition:28069}  Additional history obtained from {Additional History:28067} Records reviewed {Records Reviewed:28068} The following labs were independently interpreted: {labs interpreted:28064} and show {lab findings:28250} I independently reviewed the following imaging with  scope of interpretation limited to determining acute life threatening conditions related to emergency care: {imaging interpreted:28065} and agree with the radiologist interpretation with the following exceptions: *** I personally reviewed and interpreted cardiac monitoring: {cardiac monitoring:28251} I personally reviewed and interpreted the pt's EKG: see above for interpretation  I have reviewed the patients home medications and made adjustments as needed Consults: {Consultants:28063} Social Determinants of health:  ***  {Document critical care time when appropriate:1} {Document POCUS if Performed:1}   {Document critical care time when appropriate:1} {Document review of labs and clinical decision tools ie heart score, Chads2Vasc2 etc:1}  {Document your independent review of radiology images, and any outside records:1} {Document your discussion with family members, caretakers, and with consultants:1} {Document social determinants of health affecting pt's care:1} {Document your decision making why or why not admission, treatments were needed:1} Final Clinical Impression(s) / ED Diagnoses Final diagnoses:  None    Rx / DC Orders ED Discharge Orders     None

## 2022-10-24 NOTE — ED Triage Notes (Signed)
Pt BIB [name] with c/o of AMS. A neighbor called for him because he wasn't acting like himself. Pt smelled of urine and their were urine bottles everywhere. Urine is dark and odorous. Pt has had urinary retention for 5 days.   EMS VS BP systolic palpated at [id number] P 110 R 24 O2 3L couldn't get an oxygen reading  CBG 166

## 2022-10-24 NOTE — H&P (Signed)
History and Physical    Patient: [name] DOB: January 27, 1956 DOA: 10/24/2022 DOS: the patient was seen and examined on 10/24/2022 PCP: Fenton Foy, NP  Patient coming from: Home  Chief Complaint:  Chief Complaint  Patient presents with   Altered Mental Status   HPI: [name] is a 67 y.o. 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  Patient's neighbor apparently called EMS due to altered mental status, patient not acting himself.  He was found to be in his urine.  He has been complaining of urinary retention for years.  As indicated he has history of bladder cancer.  Patient appears very cachectic.  Here he is communicating.  He is however debilitated.  No fever or chills.  No nausea vomiting or diarrhea.  Patient is complaining of suprapubic pain around his penis.  Patient reported his last drink was several days ago.  His work up shows a creatinine of more than 5.  Previous creatinine was 1.01.  Patient also has albumin 2.7.  He is severely emaciated.  AST is elevated and total protein 11.2.  He is being admitted with AKI.  Not sure if this is pre or postrenal but patient looks cachectic and dehydrated.  Review of Systems: As mentioned in the history of present illness. All other systems reviewed and are negative. Past Medical History:  Diagnosis Date   Anorexia    Asthma with COPD ([place])    Bladder cancer ([place])    and prostatic urethra cancer  HX TURBT'S IN [place], [place]  WITH INSTILLATION CHEMO TX'S   Bladder tumor    History of transfusion    HIV disease ([place])    under care of [facility]   Hypertension     Nocturia    Vitamin D deficiency    Past Surgical History:  Procedure Laterality Date   CATARACT EXTRACTION W/ INTRAOCULAR LENS  IMPLANT, BILATERAL  2014   FULGURATION OF BLADDER TUMOR Bilateral 05/31/2014   Procedure: BLADDER BIOPSY WITH FULGERATION BILATERAL RETROGRADE [id number];  Surgeon: Ardis Hughs, MD;  Location: [facility];  Service: Urology;  Laterality: Bilateral;   JOINT REPLACEMENT     LUMBAR DISC SURGERY  x2  last one 2008   TOTAL HIP ARTHROPLASTY Bilateral x each with revision's   last one for Right 2012/  last one for Left 2010   TRANSURETHRAL RESECTION OF BLADDER TUMOR  X3   last one 02/ 2015 ([place], )   TRANSURETHRAL RESECTION OF BLADDER TUMOR WITH GYRUS (TURBT-GYRUS) Bilateral 05/16/2015   Procedure: TRANSURETHRAL RESECTION OF BLADDER TUMOR WITH GYRUS (TURBT-GYRUS), bilateral retrograde pyelogram;  Surgeon: Ardis Hughs, MD;  Location: [facility];  Service: Urology;  Laterality: Bilateral;   TRANSURETHRAL RESECTION OF BLADDER TUMOR WITH GYRUS (TURBT-GYRUS) N/A 07/05/2015   Procedure: RE RESECTION OF BLADDER TUMOR (TURBT) RIGHT BLADDER NECK;  Surgeon: Ardis Hughs, MD;  Location: [facility];  Service: Urology;  Laterality: N/A;   Social History:  reports that he has been smoking cigarettes. He has a 8.50 pack-year smoking history. He has never used smokeless tobacco. He reports current alcohol use of about 6.0 standard drinks of alcohol per week.  He reports that he does not currently use drugs after having used the following drugs: Marijuana.  Allergies  Allergen Reactions   Aspirin Shortness Of Breath, Nausea And Vomiting and Swelling    Family History  Problem Relation Age of Onset   Cancer Mother        Gastric Cancer   Hypertension Mother    Heart disease Mother    Cancer Maternal Grandmother        breast cancer     Prior to Admission medications   Medication Sig Start Date End Date Taking? Authorizing Provider  albuterol  (VENTOLIN HFA) 108 (90 Base) MCG/ACT inhaler INHALE 2 PUFFS BY MOUTH INTO THE LUNGS EVERY 6 HOURS AS NEEDED FOR WHEEZING OR SHORTNESS OF BREATH 10/22/22 10/22/23  Fenton Foy, NP  amLODipine (NORVASC) 5 MG tablet Take 1 tablet (5 mg total) by mouth daily. 04/23/22   Fenton Foy, NP  atenolol (TENORMIN) 100 MG tablet Take 1 tablet (100 mg total) by mouth daily. 04/23/22 07/22/22  Fenton Foy, NP  ciprofloxacin (CIPRO) 500 MG tablet Take 1 tablet by mouth twice daily 02/21/22     Darunavir-Cobicistat-Emtricitabine-Tenofovir Alafenamide (SYMTUZA) 800-150-200-10 MG TABS Take 1 tablet by mouth daily with breakfast. 10/24/22   Michel Bickers, MD  Ensure (ENSURE) Take 1 Can by mouth 2 (two) times daily between meals. 08/22/20   Michel Bickers, MD  ipratropium-albuterol (DUONEB) 0.5-2.5 (3) MG/3ML SOLN Inhale 3 mLs (1 vial) by nebulization every 6 (six) hours as needed (wheezing and chest tightness). 01/20/22   Fenton Foy, NP  phenazopyridine (PYRIDIUM) 200 MG tablet Take 1 tablet by mouth every 8 hours as needed painful urination 02/21/22       Physical Exam: Vitals:   10/24/22 1939 10/24/22 2030 10/24/22 2100 10/24/22 2215  BP:  132/81  136/79  Pulse:   (!) 115 100  Resp:  (!) 24 (!) 24 19  Temp: 98.5 F (36.9 C)     TempSrc: Rectal     SpO2:    98%  Weight:      Height:       Constitutional: Severely cachectic, appears to be failure to thrive unkempt NAD, calm, comfortable Eyes: PERRL, lids and conjunctivae normal ENMT: Mucous membranes are moist. Posterior pharynx clear of any exudate or lesions.Normal dentition.  Neck: normal, supple, no masses, no thyromegaly Respiratory: clear to auscultation bilaterally, no wheezing, no crackles. Normal respiratory effort. No accessory muscle use.  Cardiovascular: Sinus tachycardia, no murmurs / rubs / gallops. No extremity edema. 2+ pedal pulses. No carotid bruits.  Abdomen: Scaphoid, suprapubic tenderness, no tenderness, no masses palpated.  No hepatosplenomegaly. Bowel sounds positive.  Musculoskeletal: Good range of motion, no joint swelling or tenderness, Skin: no rashes, lesions, ulcers. No induration Neurologic: CN 2-12 grossly intact. Sensation intact, DTR normal. Strength 5/5 in all 4.  Psychiatric: Normal judgment and insight. Alert and oriented x 3. Normal mood  Data Reviewed:  Sodium was 134, potassium 4.8 chloride 102.  CO2 14 glucose 130 BUN 112 creatinine 5.78 and calcium 9.4 with a gap of 18.  Albumin is 2.7 AST 63 ALT 20 total protein 11.2 total bilirubin 1.0.  CBC showed essentially normal white count 8.70 hemoglobin 13.2 platelets 234.  Alcohol level is less than 10.  Head CT without contrast was acute findings.  Moderate parenchymal volume loss.  He has mild periventricular white matter changes chronic small vessel ischemia.  Chest x-ray showed mainly findings of emphysema  Assessment and Plan:   #  1 acute kidney injury: With a history of bladder cancer and dehydration could be a combination of pre and postrenal causes.  At this point it appears to be more prerenal.  Patient is to receive 2 L bolus of LR in the ER.  We will continue with IV's normal saline at 125 an hour after the boluses.  Blood pressure seems to be stable.  Follow renal function.  Get renal ultrasound to see if this could be obstructive in nature.  He is having pain in the suprapubic area which could reflect pathology down there especially with history of bladder cancer.  #2 HIV disease: Patient being followed by Dr. Megan Salon in the infectious disease clinic.  Last CD4 count was in July last year  and was 405.  At that time viral load was 109,000.  Will defer to ID for continued treatment.  #3 alcoholism: Patient's alcohol level is less than 10.  No evidence of DT at the moment.  Will be preferred for treatment of alcohol withdrawal if patient starts showing symptoms.  #4 metabolic acidosis: No gap.  #5 history of asthma: Continue breathing  treatment.  No evidence of exacerbation.  #6 depression: Continue home regimen  #7 bladder cancer: Defer to his oncologist for treatment.  #8 adult failure to thrive: Probably multifactorial.  Continue monitoring and diet with nutrition consult.     Advance Care Planning:   Code Status: Full Code   Consults: None at the moment but may consult ID  Family Communication: No family at bedside  Severity of Illness: The appropriate patient status for this patient is INPATIENT. Inpatient status is judged to be reasonable and necessary in order to provide the required intensity of service to ensure the patient's safety. The patient's presenting symptoms, physical exam findings, and initial radiographic and laboratory data in the context of their chronic comorbidities is felt to place them at high risk for further clinical deterioration. Furthermore, it is not anticipated that the patient will be medically stable for discharge from the hospital within 2 midnights of admission.   * I certify that at the point of admission it is my clinical judgment that the patient will require inpatient hospital care spanning beyond 2 midnights from the point of admission due to high intensity of service, high risk for further deterioration and high frequency of surveillance required.*  AuthorBarbette Merino, MD 10/24/2022 11:10 PM  For on call review [email or web address].

## 2022-10-25 ENCOUNTER — Inpatient Hospital Stay (HOSPITAL_COMMUNITY): Payer: 59

## 2022-10-25 DIAGNOSIS — Z515 Encounter for palliative care: Secondary | ICD-10-CM

## 2022-10-25 DIAGNOSIS — E860 Dehydration: Secondary | ICD-10-CM | POA: Diagnosis not present

## 2022-10-25 DIAGNOSIS — R627 Adult failure to thrive: Secondary | ICD-10-CM | POA: Diagnosis not present

## 2022-10-25 DIAGNOSIS — N179 Acute kidney failure, unspecified: Secondary | ICD-10-CM | POA: Diagnosis not present

## 2022-10-25 DIAGNOSIS — Z7189 Other specified counseling: Secondary | ICD-10-CM

## 2022-10-25 LAB — RESP PANEL BY RT-PCR (RSV, FLU A&B, COVID)  RVPGX2
Influenza A by PCR: NEGATIVE
Influenza B by PCR: NEGATIVE
Resp Syncytial Virus by PCR: NEGATIVE
SARS Coronavirus 2 by RT PCR: NEGATIVE

## 2022-10-25 LAB — CBC
HCT: 30.4 % — ABNORMAL LOW (ref 39.0–52.0)
Hemoglobin: 10.4 g/dL — ABNORMAL LOW (ref 13.0–17.0)
MCH: 34.3 pg — ABNORMAL HIGH (ref 26.0–34.0)
MCHC: 34.2 g/dL (ref 30.0–36.0)
MCV: 100.3 fL — ABNORMAL HIGH (ref 80.0–100.0)
Platelets: 186 10*3/uL (ref 150–400)
RBC: 3.03 MIL/uL — ABNORMAL LOW (ref 4.22–5.81)
RDW: 13.8 % (ref 11.5–15.5)
WBC: 8.1 10*3/uL (ref 4.0–10.5)
nRBC: 0 % (ref 0.0–0.2)

## 2022-10-25 LAB — RENAL FUNCTION PANEL
Albumin: 2 g/dL — ABNORMAL LOW (ref 3.5–5.0)
Anion gap: 11 (ref 5–15)
BUN: 100 mg/dL — ABNORMAL HIGH (ref 8–23)
CO2: 17 mmol/L — ABNORMAL LOW (ref 22–32)
Calcium: 8.5 mg/dL — ABNORMAL LOW (ref 8.9–10.3)
Chloride: 108 mmol/L (ref 98–111)
Creatinine, Ser: 4.67 mg/dL — ABNORMAL HIGH (ref 0.61–1.24)
GFR, Estimated: 13 mL/min — ABNORMAL LOW (ref >60)
Glucose, Bld: 112 mg/dL — ABNORMAL HIGH (ref 70–99)
Phosphorus: 7.3 mg/dL — ABNORMAL HIGH (ref 2.5–4.6)
Potassium: 5 mmol/L (ref 3.5–5.1)
Sodium: 136 mmol/L (ref 135–145)

## 2022-10-25 LAB — BLOOD GAS, VENOUS
Acid-base deficit: 6.6 mmol/L — ABNORMAL HIGH (ref 0.0–2.0)
Bicarbonate: 18.3 mmol/L — ABNORMAL LOW (ref 20.0–28.0)
O2 Saturation: 16.5 %
Patient temperature: 36.3
pCO2, Ven: 33 mmHg — ABNORMAL LOW (ref 44–60)
pH, Ven: 7.35 (ref 7.25–7.43)
pO2, Ven: <31 mmHg — CL (ref 32–45)

## 2022-10-25 MED ORDER — SODIUM CHLORIDE 0.9 % IV SOLN
25.0000 mg | Freq: Three times a day (TID) | INTRAVENOUS | Status: AC
  Administered 2022-10-25 – 2022-10-26 (×3): 25 mg via INTRAVENOUS
  Filled 2022-10-25 (×3): qty 1

## 2022-10-25 MED ORDER — FOLIC ACID 1 MG PO TABS
1.0000 mg | ORAL_TABLET | Freq: Every day | ORAL | Status: DC
  Administered 2022-10-26 – 2022-10-28 (×3): 1 mg via ORAL
  Filled 2022-10-25 (×4): qty 1

## 2022-10-25 MED ORDER — THIAMINE MONONITRATE 100 MG PO TABS
100.0000 mg | ORAL_TABLET | Freq: Every day | ORAL | Status: DC
  Administered 2022-10-26 – 2022-10-28 (×3): 100 mg via ORAL
  Filled 2022-10-25 (×4): qty 1

## 2022-10-25 NOTE — Progress Notes (Signed)
PROGRESS NOTE Nuh Meeds  [id number] DOB: 1955-11-12 DOA: 10/20/2022 PCP: Fenton Foy, NP  Brief Narrative/Hospital Course: 67 year old male with reported COPD, HIV, hypertension, bladder tumor alcohol use brought by EMS for altered mental status after a neighbor called for him as he was not acting like himself, smell of urine and a urine bottles everywhere and he has had urinary retention x 5 days. In the ED patient is alert oriented to self place year, limited historian, complaining of a cough and shortness of breath recently and some pain in lower abdomen near his penis, admitted he does drink alcohol and last drink was several days ago.  Vitals w/ HR 135, afebrile, on RA saturating well,  found to have severe dehydration, failure to thrive, AKI w. Creat 5.7, hco3 14, ast 63, bladder scan negative, chest x-ray and CT head no acute finding.  He was given 2 L bolus UA and ultrasound obtained and admitted for further management.    Subjective: Seen and examined, Overnight vitals/labs/events reviewed  Appears cachectic frail thin answers questions alert awake has no complaint   Assessment and Plan: Principal Problem:   AKI (acute kidney injury) ([place]) Active Problems:   Essential hypertension   Asthma, chronic   Malignant neoplasm of bladder (HCC)   HIV disease ([place])   Depression   Unintentional weight loss    Severe AKI Dehydration Metabolic acidosis due to AKI: Bladder scan negative, renal ultrasound chronic medical renal disease without hydronephrosis.  With IV fluids creatinine downtrending likely prerenal.  Creatinine downtrending likely improving will continue on aggressive IV fluid hydration, avoid nephrotoxic medication, dose meds renally.  Ordered UA. Recent Labs    12/31/21 1647 04/23/22 1534 10/11/2022 2016 10/25/22 0238  BUN 14 12 102* 100*  CREATININE 1.06 1.01 5.78* 4.67*    Alcohol abuse: Level less than 10, no withdrawals,watch for withdrawal symptoms and  address accordingly, patient last drink was several days ago.-Add thiamine folate vitamins  HIV disease followed by ID last CD4 count as below stable-viral load elevated as below. Continue SYMTUZA-need to follow-up with his ID physician Lab Results  Component Value Date   CD4TABS 405 02/18/2022   CD4TABS 553 12/31/2021   CD4TABS 443 04/02/2021   Lab Results  Component Value Date   HIV1RNAQUANT 109,000 (H) 02/18/2022     Asthma not in exacerbation Depression: Mood is stable Hypertension BP stable continue home amlodipine Anemia likely from continued hemoglobin 10.5 g, monitor History of bladder cancer Severe malnutrition BMI 16.9, dietitian consulted-augment aggressively status.  Speech eval for swallowing Spitting during eating and taking meds:speech eval requested Goals of care full code.  Requesting palliative care consultation overall prognosis appears guarded to poor Pressure ulcer STAGE 2  left buttock POA see below Pressure Injury 10/25/22 Buttocks Left Stage 2 -  Partial thickness loss of dermis presenting as a shallow open injury with a red, pink wound bed without slough. (Active)  10/25/22 0100  Location: Buttocks  Location Orientation: Left  Staging: Stage 2 -  Partial thickness loss of dermis presenting as a shallow open injury with a red, pink wound bed without slough.  Wound Description (Comments):   Present on Admission: Yes  Dressing Type Foam - Lift dressing to assess site every shift 10/25/22 0111   DVT prophylaxis: enoxaparin (LOVENOX) injection 30 mg Start: 10/25/22 1000 Code Status:   Code Status: Full Code Family Communication: plan of care discussed with patient at bedside. Patient status is: Inpatient because of AKI Level of care: Telemetry Medical  Dispo: The patient is from: Home            Anticipated disposition: TBD Objective: Vitals last 24 hrs: Vitals:   10/22/2022 2215 10/25/22 0045 10/25/22 0111 10/25/22 0533  BP: 136/79  (!) 153/67 (!) 128/57   Pulse: 100  82 81  Resp: 19  18 18   Temp:  97.7 F (36.5 C) (!) 97.4 F (36.3 C) 97.6 F (36.4 C)  TempSrc:  Oral Oral Oral  SpO2: 98%  100% 100%  Weight:    44.7 kg  Height:       Weight change:   Physical Examination: General exam: alert awake, thin and frail older than stated age HEENT:Oral mucosa moist, Ear/Nose WNL grossly Respiratory system: bilaterally CLEAR BS, no use of accessory muscle Cardiovascular system: S1 & S2 +, No JVD. Gastrointestinal system: Abdomen soft, scaphoid,NT,ND, BS+ Nervous System:Alert, awake, moving extremities. Extremities: LE edema neg,distal peripheral pulses palpable.  Skin: No rashes,no icterus. MSK: small muscle bulk,tone, power  Medications reviewed:  Scheduled Meds:  amLODipine  5 mg Oral Daily   Darunavir-Cobicistat-Emtricitabine-Tenofovir Alafenamide  1 tablet Oral Q breakfast   enoxaparin (LOVENOX) injection  30 mg Subcutaneous [id number]   folic acid  1 mg Oral Daily   thiamine  100 mg Oral Daily   Continuous Infusions:  dextrose 5 % and 0.9% NaCl 125 mL/hr at 10/25/22 1019   Diet Order             Diet renal with fluid restriction Fluid restriction: 1200 mL Fluid; Room service appropriate? Yes; Fluid consistency: Thin  Diet effective now                  Intake/Output Summary (Last 24 hours) at 10/25/2022 1026 Last data filed at 10/25/2022 0646 Gross per 24 hour  Intake 1560.89 ml  Output 0 ml  Net 1560.89 ml   Net IO Since Admission: 1,560.89 mL [10/25/22 1026]  Wt Readings from Last 3 Encounters:  10/25/22 44.7 kg  04/23/22 54.4 kg  02/18/22 54 kg     Unresulted Labs (From admission, onward)     Start     Ordered   10/03/2022 0500  Creatinine, serum  (enoxaparin (LOVENOX)    CrCl >/= 30 ml/min)  Weekly,   R     Comments: while on enoxaparin therapy    10/04/2022 2310   10/26/22 [id number]  Basic metabolic panel  Daily,   R      10/25/22 0759   10/26/22 0500  CBC  Daily,   R      10/25/22 0759   10/15/2022 1930   Urinalysis, Routine w reflex microscopic -Urine, Clean Catch  Once,   URGENT       Question:  Specimen Source  Answer:  Urine, Clean Catch   10/15/2022 1930   10/11/2022 1930  Rapid urine drug screen (hospital performed)  Once,   STAT        10/30/2022 1930          Data Reviewed: I have personally reviewed following labs and imaging studies CBC: Recent Labs  Lab 11/01/2022 2016 10/08/2022 2021 10/25/22 0238  WBC 8.7  --  8.1  NEUTROABS 7.0  --   --   HGB 13.2 13.3 10.4*  HCT 38.9* 39.0 30.4*  MCV 102.1*  --  100.3*  PLT 234  --  99991111   Basic Metabolic Panel: Recent Labs  Lab 10/14/2022 2016 10/28/2022 2021 10/25/22 0238  NA 134* 139 136  K 4.8 5.1  5.0  CL 102  --  108  CO2 14*  --  17*  GLUCOSE 130*  --  112*  BUN 102*  --  100*  CREATININE 5.78*  --  4.67*  CALCIUM 9.4  --  8.5*  PHOS  --   --  7.3*   GFR: Estimated Creatinine Clearance: 9.4 mL/min (A) (by C-G formula based on SCr of 4.67 mg/dL (H)). Liver Function Tests: Recent Labs  Lab 10/29/2022 2016 10/25/22 0238  AST 63*  --   ALT 30  --   ALKPHOS 80  --   BILITOT 1.0  --   PROT 11.2*  --   ALBUMIN 2.7* 2.0*  CBG: Recent Labs  Lab 10/03/2022 2251  GLUCAP 132*   Recent Results (from the past 240 hour(s))  Resp panel by RT-PCR (RSV, Flu A&B, Covid) Anterior Nasal Swab     Status: None   Collection Time: 10/20/2022  9:21 PM   Specimen: Anterior Nasal Swab  Result Value Ref Range Status   SARS Coronavirus 2 by RT PCR NEGATIVE NEGATIVE Final   Influenza A by PCR NEGATIVE NEGATIVE Final   Influenza B by PCR NEGATIVE NEGATIVE Final    Comment: (NOTE) The Xpert Xpress SARS-CoV-2/FLU/RSV plus assay is intended as an aid in the diagnosis of influenza from Nasopharyngeal swab specimens and should not be used as a sole basis for treatment. Nasal washings and aspirates are unacceptable for Xpert Xpress SARS-CoV-2/FLU/RSV testing.  Fact Sheet for Patients: EntrepreneurPulse.com.au  Fact Sheet for  Healthcare Providers: IncredibleEmployment.be  This test is not yet approved or cleared by the [place] FDA and has been authorized for detection and/or diagnosis of SARS-CoV-2 by FDA under an Emergency Use Authorization (EUA). This EUA will remain in effect (meaning this test can be used) for the duration of the COVID-19 declaration under Section 564(b)(1) of the Act, 21 U.S.C. section 360bbb-3(b)(1), unless the authorization is terminated or revoked.     Resp Syncytial Virus by PCR NEGATIVE NEGATIVE Final    Comment: (NOTE) Fact Sheet for Patients: EntrepreneurPulse.com.au  Fact Sheet for Healthcare Providers: IncredibleEmployment.be  This test is not yet approved or cleared by the [place] FDA and has been authorized for detection and/or diagnosis of SARS-CoV-2 by FDA under an Emergency Use Authorization (EUA). This EUA will remain in effect (meaning this test can be used) for the duration of the COVID-19 declaration under Section 564(b)(1) of the Act, 21 U.S.C. section 360bbb-3(b)(1), unless the authorization is terminated or revoked.  Performed at [facility], [place] [address] [place], [place] [address]     Antimicrobials: Anti-infectives (From admission, onward)    Start     Dose/Rate Route Frequency Ordered Stop   10/25/22 0845  Darunavir-Cobicistat-Emtricitabine-Tenofovir Alafenamide (SYMTUZA) 800-150-200-10 MG TABS 1 tablet        1 tablet Oral Daily with breakfast 10/25/2022 2310        Culture/Microbiology    Component Value Date/Time   SDES BLOOD RIGHT FOREARM 09/10/2016 0319   SPECREQUEST BOTTLES DRAWN AEROBIC AND ANAEROBIC 10CC EA 09/10/2016 0319   CULT NO GROWTH 5 DAYS 09/10/2016 0319   REPTSTATUS 09/15/2016 FINAL 09/10/2016 0319   Other culture-see note  Radiology Studies: US Renal  Result Date: 10/25/2022 CLINICAL DATA:  Acute kidney injury.  Urinary retention. EXAM: RENAL / URINARY  TRACT ULTRASOUND COMPLETE COMPARISON:  None Available. FINDINGS: Right Kidney: Renal measurements: 7.8 x 3.7 x 6 cm = volume: 89 mL. Diffusely increased parenchymal echotexture with diffuse parenchymal  atrophy. Changes suggest chronic medical renal disease. Small simple appearing cyst in the upper pole measuring 1 cm diameter. No imaging follow-up is indicated. No hydronephrosis or hydroureter. Left Kidney: Renal measurements: 9.6 x 4.4 x 6.5 cm = volume: 141 mL. Diffusely increased renal parenchymal echotexture suggesting chronic medical renal disease. No focal lesions identified. No hydronephrosis or hydroureter. Bladder: Bladder is decompressed with a current volume of 18 mL. No stone or filling defect is identified. Other: None. IMPRESSION: 1. Increased parenchymal echotexture of the kidneys consistent with chronic medical renal disease. No hydronephrosis. Electronically Signed   By: Lucienne Capers M.D.   On: 10/25/2022 00:08   DG Chest 2 View  Result Date: 10/26/2022 CLINICAL DATA:  Cough. EXAM: CHEST - 2 VIEW COMPARISON:  02/22/2018 FINDINGS: Heart size and pulmonary vascularity are normal. Pulmonary hyperinflation likely representing emphysematous change. Scattered fibrosis in the lungs. No consolidation or airspace disease. No pleural effusions. No pneumothorax. Degenerative changes in the spine. IMPRESSION: Emphysematous changes in the lungs. No evidence of active pulmonary disease. Electronically Signed   By: Lucienne Capers M.D.   On: 10/30/2022 21:46   CT HEAD WO CONTRAST  Result Date: 10/19/2022 CLINICAL DATA:  Altered mental status EXAM: CT HEAD WITHOUT CONTRAST TECHNIQUE: Contiguous axial images were obtained from the base of the skull through the vertex without intravenous contrast. RADIATION DOSE REDUCTION: This exam was performed according to the departmental dose-optimization program which includes automated exposure control, adjustment of the mA and/or kV according to patient size  and/or use of iterative reconstruction technique. COMPARISON:  None Available. FINDINGS: Brain: Normal anatomic configuration. Moderate parenchymal volume loss is slightly greater than would be typically expected for age. Mild periventricular white matter changes are present likely reflecting the sequela of small vessel ischemia. No abnormal intra or extra-axial mass lesion or fluid collection. No abnormal mass effect or midline shift. No evidence of acute intracranial hemorrhage or infarct. Mild ventriculomegaly appears commensurate with the degree of parenchymal volume loss and likely reflects the sequela of central atrophy. Cerebellum unremarkable. Vascular: No asymmetric hyperdense vasculature at the skull base. Skull: Intact Sinuses/Orbits: Postsurgical changes within the visualized paranasal sinuses with mild mucosal thickening noted within the residual ethmoid air cells and left maxillary sinus. No air-fluid levels. Orbits are unremarkable. Other: Mastoid air cells and middle ear cavities are clear. IMPRESSION: 1. No acute intracranial hemorrhage or infarct. 2. Moderate parenchymal volume loss, slightly greater than would be typically expected for age. 3. Mild periventricular white matter changes likely reflecting the sequela of small vessel ischemia. Electronically Signed   By: Fidela Salisbury M.D.   On: 10/12/2022 20:57     LOS: 1 day   Antonieta Pert, MD [facility]  10/25/2022, 10:26 AM

## 2022-10-25 NOTE — Progress Notes (Signed)
Modified Barium Swallow Study  Patient Details  Name: [name] MRN: [id number] Date of Birth: [birth year]  Today's Date: 10/25/2022  Modified Barium Swallow completed.  Full report located under Chart Review in the Imaging Section.  History of Present Illness Pt is a 67 y.o. 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 Pt is generally alert and oriented, presenting generally lethargic with reduced intelligibility. Pt reports difficulty when swallowing liquids and solids. Pt is currently on a renal diet is a concern for malnutrition and reduced hydration., with a fluid restriction of 1200 mL. Chest CXR on 10/24/22 revealed Emphysematous changes in the lungs and no evidence of active pulmonary disease.   Clinical Impression Patient presents with a mild to moderate oral phase dysphagia and a mod-severe pharyngeal phase dysphagia. Both oral and pharyngeal phase of swallow appear significantly impacted by oral motor and pharyngeal musculature weakness. In addition, appearance of suspected edema of posterior pharynx and arytenoids resulted in decreased epiglottic inversion and decreased PES opening. With thin liquids, patient exhibited delayed oral and pharyngeal phase, followed by aspiration after the initial swallow from residuals left in pharynx after incomplete bolus transit through PES. Patient exhibited similar delays in oral and pharyngeal phase of swallow, slightly increased amount of pharyngeal residuals (diffuse) but no penetration or aspiration prior to, during or after swallow. Patient able to swallow bite of puree solid, however this resulted in greater amount of  pharyngeal residuals, with patient eventually coughing and expectorating majority of puree bolus. Although aspiration was silent, patient did have effective cough to clear penetrate even when at level or just below vocal cords. SLP suspects that pharyngeal secretions are impacting patient's swallow function and could also be the result of pharyngeal phase dysphagia. Patient is at a high risk of aspiration, malnutrition and dehydration. SLP recommending clear liquids/nectar thick at this time and will continue to follow patient for PO recommendations and ability to tolerate. Factors that may increase risk of adverse event in presence of aspiration ([place] 2021): Frail or deconditioned;Inadequate oral hygiene;Poor general health and/or compromised immunity  Swallow Evaluation Recommendations Recommendations: PO diet PO Diet Recommendation: Clear liquid diet;Mildly thick liquids (Level 2, nectar thick) Liquid Administration via: Cup;Straw Medication Administration: Crushed with puree Supervision: Intermittent supervision/cueing for swallowing strategies;Set-up assistance for safety Swallowing strategies  : Slow rate;Small bites/sips;Clear throat intermittently Postural changes: Position pt fully upright for meals;Stay upright 30-60 min after meals Oral care recommendations: Oral care BID (2x/day);Staff/trained caregiver to provide oral care   Sonia Baller, MA, CCC-SLP Speech Therapy

## 2022-10-25 NOTE — Consult Note (Signed)
Palliative Care Consult Note                                  Date: 10/25/2022   Patient Name: [name]  DOB: 1956/07/07  MRN: [id number]  Age / Sex: 67 y.o., male  PCP: Fenton Foy, NP Referring Physician: Antonieta Pert, MD  Reason for Consultation: Establishing goals of care  HPI/Patient Profile: 67 y.o. male  with past medical history of COPD, HIV, bladder cancer s/p TURP and chemo, HTN, and alcohol use disorder who presented to [facility] ED on 10/24/2022 with altered mental status.  He was found by neighbor not acting like himself, smelling of urine, and with urine bottles everywhere.  He was admitted with severe dehydration, failure to thrive, and AKI with creatinine 5.78.  Palliative Medicine was consulted for goals of care.  Past Medical History:  Diagnosis Date   Anorexia    Asthma with COPD ([place])    Bladder cancer ([place])    and prostatic urethra cancer  HX TURBT'S IN [place], [place]  WITH INSTILLATION CHEMO TX'S   Bladder tumor    History of transfusion    HIV disease ([place])    under care of [facility]   Hypertension    Nocturia    Vitamin D deficiency     Subjective:   I have reviewed medical records including progress notes, labs and imaging.  I met with patient to discuss diagnosis, prognosis, GOC, disposition, and options.  Throughout our discussion, he seems to be a poor historian.  His speech is also garbled and very difficult to understand at times.  For this reason, I also spoke with his sister/Lillie by phone.   I introduced Palliative Medicine as specialized medical care for people living with serious illness. It focuses on providing relief from the symptoms and stress of a serious illness.   We discussed patient's current illness and what it means in the larger context of his ongoing co-morbidities.  Current clinical status was reviewed.   Created space and opportunity for patient and  family to express thoughts regarding current medical situation. Values and goals of care were attempted to be elicited. Questions and concerns were addressed.    Life Review: Patient is originally from [place].  He gives inconsistent information on his marital status -initially telling me he is not married but then stating he is still legally married to a woman in [place].  He has a daughter Mickel Baas) who lives in [place] and a son who lives in [place].  His sister/Lillie and brother/Michael live (together) in [place].   Functional Status: Patient lives alone in his apartment. His sister checks on him weekly.   Patient/Family Understanding of Illness: Patient seems to have limited insight into his current medical situation. When I asked him what he understood about his medical issues he states "I don't know". When I asked him if he wanted to discuss his medical issues he stated "not really".  However, his sister does seem to understand the seriousness of Jamaul's medical condition. I did briefly discuss with both patient and sister his acute issues of AKI, dehydration, and malnutrition/failure to thrive. I also shared my concern that Raynel is not taking care of himself at home.   Code Status: We did discuss code status. I encouraged consideration of DNR status understanding prognosis would be poor in the event of cardiac arrest. Alfreddie initially  states he wants to "live as long as possible". However on further discussion he states he would not want wants to be resuscitated in the event of cardiac arrest. In the event of respiratory distress (without cardiac arrest), he would want a trial of mechanical ventilation.   Advanced Directives: I asked Eray who he would want to make medical decisions for him if he was unable to make those decisions for himself.  He states it would be Bernadette Hoit, who is his (estranged?) wife in [place]. However, he does not have her phone number.  His sister does not  agree with this, and states she should be his health care agent.    Review of Systems  Constitutional:        Hiccups    Objective:   Primary Diagnoses: Present on Admission:  Essential hypertension  Malignant neoplasm of bladder ([place])  Unintentional weight loss  HIV disease ([place])  Asthma, chronic  Depression  AKI (acute kidney injury) ([place])   Physical Exam Vitals reviewed.  Constitutional:      General: He is not in acute distress.    Appearance: He is cachectic. He is ill-appearing.  Pulmonary:     Effort: Pulmonary effort is normal.  Neurological:     Mental Status: He is alert and oriented to person, place, and time.  Psychiatric:        Mood and Affect: Affect is flat.    Vital Signs:  BP (!) 128/57 (BP Location: Left Arm)   Pulse 81   Temp 97.6 F (36.4 C) (Oral)   Resp 18   Ht 5\' 4"  (1.626 m)   Wt 44.7 kg   SpO2 100%   BMI 16.92 kg/m   Palliative Assessment/Data: PPS 40-50%     Assessment & Plan:   SUMMARY OF RECOMMENDATIONS   Full code for now pending further discussion Full scope interventions Meeting with patient and sister tomorrow at 1 pm  Primary Decision Maker: Patient with support from family  Symptom Management:  Thorazine 25 mg IV every 8 hours x 3 doses for hiccups  Prognosis:  Unable to determine  Discharge Planning:  To Be Determined     Thank you for allowing [place] to participate in the care of [place]   Signed by: Elie Confer, NP Palliative Medicine Team  Team Phone # [phone]  For individual providers, please see AMION

## 2022-10-25 NOTE — Progress Notes (Signed)
New Admission Note:  Arrival Method: Stretcher from ED Mental Orientation:  A&Ox3 Telemetry: Box #8 Assessment: Completed Skin: Stg 2 L buttock, foam applied IV: PIV L FA with bolus running Pain: 0/10 Tubes: None Safety Measures: Safety Fall Prevention Plan was discussed  Admission: Completed Belongings: Clothing in bag at bedside Unit Orientation: Patient has been oriented to the room, unit, and the staff.  Orders have been reviewed and implemented. Call light has been placed within reach and bed alarm has been activated.   Percell Boston, RN

## 2022-10-25 NOTE — Hospital Course (Addendum)
67 year old male with reported COPD, HIV, hypertension, bladder tumor alcohol use brought by EMS for altered mental status after a neighbor called for him as he was not acting like himself, smell of urine and a urine bottles everywhere and he has had urinary retention x 5 days. In the ED patient is alert oriented to self place year, limited historian, complaining of a cough and shortness of breath recently and some pain in lower abdomen near his penis, admitted he does drink alcohol and last drink was several days ago.  Vitals w/ HR 135, afebrile, on RA saturating well,  found to have severe dehydration, failure to thrive, AKI w. Creat 5.7, hco3 14, ast 63, bladder scan negative, chest x-ray and CT head no acute finding.  He was given 2 L bolus UA and ultrasound obtained and admitted for further management.

## 2022-10-25 NOTE — Evaluation (Signed)
Clinical/Bedside Swallow Evaluation Patient Details  Name: [name] MRN: [id number] Date of Birth: [birth year]  Today's Date: 10/25/2022 Time: SLP Start Time (ACUTE ONLY): 1150 SLP Stop Time (ACUTE ONLY): [id number] SLP Time Calculation (min) (ACUTE ONLY): 30 min  Past Medical History:  Past Medical History:  Diagnosis Date   Anorexia    Asthma with COPD ([place])    Bladder cancer ([place])    and prostatic urethra cancer  HX TURBT'S IN [place], [place]  WITH INSTILLATION CHEMO TX'S   Bladder tumor    History of transfusion    HIV disease ([place])    under care of [facility]   Hypertension    Nocturia    Vitamin D deficiency    Past Surgical History:  Past Surgical History:  Procedure Laterality Date   CATARACT EXTRACTION W/ INTRAOCULAR LENS  IMPLANT, BILATERAL  2014   FULGURATION OF BLADDER TUMOR Bilateral 05/31/2014   Procedure: BLADDER BIOPSY WITH FULGERATION BILATERAL RETROGRADE [id number];  Surgeon: Ardis Hughs, MD;  Location: [facility];  Service: Urology;  Laterality: Bilateral;   JOINT REPLACEMENT     LUMBAR DISC SURGERY  x2  last one 2008   TOTAL HIP ARTHROPLASTY Bilateral x each with revision's   last one for Right 2012/  last one for Left 2010   TRANSURETHRAL RESECTION OF BLADDER TUMOR  X3   last one 02/ 2015 ([place], [place])   TRANSURETHRAL RESECTION OF BLADDER TUMOR WITH GYRUS (TURBT-GYRUS) Bilateral 05/16/2015   Procedure: TRANSURETHRAL RESECTION OF BLADDER TUMOR WITH GYRUS (TURBT-GYRUS), bilateral retrograde pyelogram;  Surgeon: Ardis Hughs, MD;  Location: [facility];  Service: Urology;  Laterality: Bilateral;   TRANSURETHRAL RESECTION OF BLADDER TUMOR WITH GYRUS (TURBT-GYRUS) N/A 07/05/2015   Procedure: RE RESECTION OF BLADDER TUMOR (TURBT) RIGHT BLADDER NECK;  Surgeon: Ardis Hughs, MD;  Location: [facility];  Service: Urology;  Laterality: N/A;   HPI:  Pt is a 67 y.o. 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 Pt is generally alert and oriented, presenting generally lethargic with reduced intelligibility. Pt reports difficulty when swallowing liquids and solids. Pt is currently on a renal diet is a concern for malnutrition and reduced hydration., with a fluid restriction of 1200 mL. Chest CXR on 10/24/22 revealed Emphysematous changes in the lungs and no evidence of active pulmonary disease.    Assessment / Plan / Recommendation  Clinical Impression  Pt seen for skilled ST BSE to assess swallow function. Pt is currently on a regular/thin liquid diet and was assessed with thin liquids, mildly thickened liquid, puree, and regular solids. The pt's OME revealed moderate-severe secretion and edentulism, otherwise WFL. The pt consumed thin liquids via cup sip x3 with an immediate cough, wet vocal quality, and pt reports of globus sensation. The pt consumed thickened liquid via cup sip and had an immediate cough with and an emesis of secretions, pt reports that generally the thickened liquids "went down better". The pt had puree x2 via spoon and had a wet vocal quality and required multiple swallows. The pt consumed regular solids from lunch tray (hamburger meat) and required it to be cut up, the pt attempted mastication but physically  removed the bolus from the oral cavity reporting "it won't go down". Pt given a transitional solid, which he was able to swallow without overt s/s of aspiration but pt still reported a globus sensation. Given the pt's performance with BSE and current health status, safest diet recs this time are Dysphagia 2 with mildly thickened liquids with meals with STRCIT aspiration  precautions (sit upright for PO intake and 30 minutes following, rate modification, small bites and sips, alternation of solids and liquids). To address concerns for hydration needs, pt can have thin liquids outside of meals. Pt appropriate for MBSS to assess pharyngeal function and determine safest recs moving forward. SLP to follow through [place]. SLP Visit Diagnosis: Dysphagia, unspecified (R13.10)    Aspiration Risk  Moderate aspiration risk;Risk for inadequate nutrition/hydration    Diet Recommendation Dysphagia 2 (Fine chop);Nectar-thick liquid   Liquid Administration via: Straw;Cup Medication Administration: Whole meds with puree Supervision: Patient able to self feed Compensations: Slow rate;Small sips/bites;Minimize environmental distractions Postural Changes: Seated upright at 90 degrees;Remain upright for at least 30 minutes after po intake    Other  Recommendations      Recommendations for follow up therapy are one component of a multi-disciplinary discharge planning process, led by the attending physician.  Recommendations may be updated based on patient status, additional functional criteria and insurance authorization.  Follow up Recommendations Acute inpatient rehab (3hours/day)      Assistance Recommended at Discharge    Functional Status Assessment Patient has had a recent decline in their functional status and/or demonstrates limited ability to make significant improvements in function in a reasonable and predictable amount of time  Frequency and Duration min 2x/week  1 week       Prognosis Prognosis for improved oropharyngeal function: Guarded (given complex health status) Barriers to Reach Goals: Severity of deficits      Swallow Study   General Date of Onset: 10/25/22 HPI: Pt is a 67 y.o. 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 Pt is generally alert and oriented, presenting generally lethargic with reduced intelligibility. Pt reports difficulty when swallowing liquids and solids. Pt is currently on a renal diet is a concern for malnutrition and reduced hydration., with a fluid restriction of 1200 mL. Chest CXR on 10/24/22 revealed Emphysematous changes in the lungs and no evidence of active pulmonary disease. Type of Study: Bedside Swallow Evaluation Diet Prior to this Study: Regular;Thin liquids (Level 0) Temperature Spikes Noted: No Respiratory Status: Room air History of Recent Intubation: No Behavior/Cognition: Lethargic/Drowsy;Cooperative Oral Cavity Assessment: Excessive secretions Oral Care Completed by SLP: No Oral Cavity - Dentition: Edentulous Vision: Functional for self-feeding Self-Feeding Abilities: Able to feed self Patient Positioning: Upright in bed Baseline Vocal Quality: Low vocal intensity Volitional Cough: Strong Volitional Swallow: Able to elicit    Oral/Motor/Sensory Function Overall Oral Motor/Sensory Function: Within functional limits (Pt is edentulous and had excessive secretions)   Ice Chips     Thin Liquid Thin Liquid: Impaired Presentation: Cup Oral Phase Functional Implications: Oral holding Pharyngeal  Phase Impairments: Wet Vocal Quality;Throat Clearing - Immediate;Cough - Immediate Other Comments: Pt x3 sips via cup, immediate cough and throat clearances and wet vocal quality observed  Nectar Thick Nectar Thick Liquid: Impaired Presentation: Cup;Self Fed Pharyngeal Phase Impairments: Wet Vocal Quality;Cough - Immediate Other Comments: Pt reports reduced discomfort with PO intake of thickened vs thins generally. Pt had a large sip of thickened liquid and had an immediate  cough and emesis of secretions.   Honey Thick Honey Thick Liquid: Not tested   Puree Puree: Impaired Presentation: Spoon;Self Fed Pharyngeal Phase Impairments: Wet Vocal Quality;Suspected delayed Swallow;Multiple swallows   Solid     Solid: Impaired Presentation: Self Fed Oral Phase Impairments: Impaired mastication;Other (comment) Oral Phase Functional Implications: Impaired mastication;Oral holding Pharyngeal Phase Impairments: Other (comments) Other Comments: Pt unable to consumed regular solids from lunch tray (hamburger) when cut up, with the pt physically removing the bolus from his oral cavity saying "It can't go down". Pt able to consume transitional regular solid with reduced globus sensation and s/s of aspiration      Canary Brim M.S. CF-SLP

## 2022-10-26 ENCOUNTER — Inpatient Hospital Stay (HOSPITAL_COMMUNITY): Payer: 59

## 2022-10-26 DIAGNOSIS — Z515 Encounter for palliative care: Secondary | ICD-10-CM | POA: Diagnosis not present

## 2022-10-26 DIAGNOSIS — Z66 Do not resuscitate: Secondary | ICD-10-CM | POA: Diagnosis not present

## 2022-10-26 DIAGNOSIS — Z7189 Other specified counseling: Secondary | ICD-10-CM | POA: Diagnosis not present

## 2022-10-26 DIAGNOSIS — B20 Human immunodeficiency virus [HIV] disease: Secondary | ICD-10-CM

## 2022-10-26 DIAGNOSIS — N179 Acute kidney failure, unspecified: Secondary | ICD-10-CM | POA: Diagnosis not present

## 2022-10-26 DIAGNOSIS — I351 Nonrheumatic aortic (valve) insufficiency: Secondary | ICD-10-CM

## 2022-10-26 DIAGNOSIS — I4729 Other ventricular tachycardia: Secondary | ICD-10-CM | POA: Insufficient documentation

## 2022-10-26 DIAGNOSIS — L8990 Pressure ulcer of unspecified site, unspecified stage: Secondary | ICD-10-CM | POA: Diagnosis present

## 2022-10-26 DIAGNOSIS — E860 Dehydration: Secondary | ICD-10-CM | POA: Diagnosis not present

## 2022-10-26 LAB — ECHOCARDIOGRAM COMPLETE
Area-P 1/2: 3.85 cm2
Height: 64 in
P 1/2 time: 248 msec
S' Lateral: 2.8 cm
Single Plane A4C EF: 49.9 %
Weight: 1555.2 oz

## 2022-10-26 LAB — CBC
HCT: 28.5 % — ABNORMAL LOW (ref 39.0–52.0)
Hemoglobin: 9.6 g/dL — ABNORMAL LOW (ref 13.0–17.0)
MCH: 34 pg (ref 26.0–34.0)
MCHC: 33.7 g/dL (ref 30.0–36.0)
MCV: 101.1 fL — ABNORMAL HIGH (ref 80.0–100.0)
Platelets: 151 10*3/uL (ref 150–400)
RBC: 2.82 MIL/uL — ABNORMAL LOW (ref 4.22–5.81)
RDW: 13.6 % (ref 11.5–15.5)
WBC: 4.5 10*3/uL (ref 4.0–10.5)
nRBC: 0 % (ref 0.0–0.2)

## 2022-10-26 LAB — BASIC METABOLIC PANEL
Anion gap: 5 (ref 5–15)
BUN: 63 mg/dL — ABNORMAL HIGH (ref 8–23)
CO2: 17 mmol/L — ABNORMAL LOW (ref 22–32)
Calcium: 7.9 mg/dL — ABNORMAL LOW (ref 8.9–10.3)
Chloride: 121 mmol/L — ABNORMAL HIGH (ref 98–111)
Creatinine, Ser: 2.18 mg/dL — ABNORMAL HIGH (ref 0.61–1.24)
GFR, Estimated: 32 mL/min — ABNORMAL LOW (ref >60)
Glucose, Bld: 143 mg/dL — ABNORMAL HIGH (ref 70–99)
Potassium: 3.7 mmol/L (ref 3.5–5.1)
Sodium: 143 mmol/L (ref 135–145)

## 2022-10-26 LAB — TSH: TSH: 1.186 u[IU]/mL (ref 0.350–4.500)

## 2022-10-26 LAB — BRAIN NATRIURETIC PEPTIDE: B Natriuretic Peptide: 1293.6 pg/mL — ABNORMAL HIGH (ref 0.0–100.0)

## 2022-10-26 LAB — TROPONIN I (HIGH SENSITIVITY)
Troponin I (High Sensitivity): 3101 ng/L (ref <18)
Troponin I (High Sensitivity): 2292 ng/L (ref <18)

## 2022-10-26 LAB — HEPARIN LEVEL (UNFRACTIONATED): Heparin Unfractionated: 0.4 IU/mL (ref 0.30–0.70)

## 2022-10-26 MED ORDER — POTASSIUM CHLORIDE 10 MEQ/100ML IV SOLN
10.0000 meq | INTRAVENOUS | Status: AC
  Administered 2022-10-26 (×2): 10 meq via INTRAVENOUS
  Filled 2022-10-26 (×2): qty 100

## 2022-10-26 MED ORDER — AMIODARONE IV BOLUS ONLY 150 MG/100ML
150.0000 mg | INTRAVENOUS | Status: AC
  Administered 2022-10-26: 150 mg via INTRAVENOUS
  Filled 2022-10-26: qty 100

## 2022-10-26 MED ORDER — ADULT MULTIVITAMIN W/MINERALS CH
1.0000 | ORAL_TABLET | Freq: Every day | ORAL | Status: DC
  Administered 2022-10-27 – 2022-10-28 (×2): 1 via ORAL
  Filled 2022-10-26 (×3): qty 1

## 2022-10-26 MED ORDER — FOOD THICKENER (SIMPLYTHICK): 1.0000 | ORAL | Status: DC | PRN

## 2022-10-26 MED ORDER — MORPHINE SULFATE (PF) 2 MG/ML IV SOLN: 1.0000 mg | INTRAVENOUS | Status: DC | PRN

## 2022-10-26 MED ORDER — AMIODARONE LOAD VIA INFUSION
150.0000 mg | Freq: Once | INTRAVENOUS | Status: DC
  Filled 2022-10-26: qty 83.34

## 2022-10-26 MED ORDER — AMIODARONE HCL IN DEXTROSE 360-4.14 MG/200ML-% IV SOLN
30.0000 mg/h | INTRAVENOUS | Status: DC
  Administered 2022-10-26 – 2022-10-28 (×4): 30 mg/h via INTRAVENOUS
  Filled 2022-10-26 (×4): qty 200

## 2022-10-26 MED ORDER — METOPROLOL TARTRATE 25 MG PO TABS
25.0000 mg | ORAL_TABLET | Freq: Two times a day (BID) | ORAL | Status: DC
  Administered 2022-10-26 – 2022-10-27 (×2): 25 mg via ORAL
  Filled 2022-10-26 (×4): qty 1

## 2022-10-26 MED ORDER — MAGNESIUM SULFATE 2 GM/50ML IV SOLN
2.0000 g | Freq: Once | INTRAVENOUS | Status: AC
  Administered 2022-10-26: 2 g via INTRAVENOUS
  Filled 2022-10-26: qty 50

## 2022-10-26 MED ORDER — AMIODARONE HCL IN DEXTROSE 360-4.14 MG/200ML-% IV SOLN: 30.0000 mg/h | INTRAVENOUS | Status: DC

## 2022-10-26 MED ORDER — ROSUVASTATIN CALCIUM 20 MG PO TABS
20.0000 mg | ORAL_TABLET | Freq: Every day | ORAL | Status: DC
  Administered 2022-10-26 – 2022-10-28 (×3): 20 mg via ORAL
  Filled 2022-10-26 (×4): qty 1

## 2022-10-26 MED ORDER — BOOST / RESOURCE BREEZE PO LIQD CUSTOM
1.0000 | Freq: Three times a day (TID) | ORAL | Status: DC
  Administered 2022-10-27 – 2022-10-28 (×3): 1 via ORAL

## 2022-10-26 MED ORDER — HEPARIN (PORCINE) 25000 UT/250ML-% IV SOLN
750.0000 [IU]/h | INTRAVENOUS | Status: DC
  Administered 2022-10-26: 600 [IU]/h via INTRAVENOUS
  Administered 2022-10-27 – 2022-10-29 (×2): 750 [IU]/h via INTRAVENOUS
  Filled 2022-10-26 (×3): qty 250

## 2022-10-26 MED ORDER — AMIODARONE HCL IN DEXTROSE 360-4.14 MG/200ML-% IV SOLN
60.0000 mg/h | INTRAVENOUS | Status: DC
  Filled 2022-10-26: qty 200

## 2022-10-26 MED ORDER — PROSOURCE PLUS PO LIQD
30.0000 mL | Freq: Three times a day (TID) | ORAL | Status: DC
  Administered 2022-10-27: 30 mL via ORAL
  Filled 2022-10-26 (×5): qty 30

## 2022-10-26 MED ORDER — NITROGLYCERIN 0.4 MG SL SUBL
0.4000 mg | SUBLINGUAL_TABLET | SUBLINGUAL | Status: DC | PRN
  Administered 2022-10-26: 0.4 mg via SUBLINGUAL
  Filled 2022-10-26: qty 1

## 2022-10-26 NOTE — Progress Notes (Signed)
Echocardiogram without definite wall motion abnormality, low normal LVEF. His exam was limited due to barrel shaped chest, and I did not appreciate murmur, bur echocardiogram shows moderate to severe aortic regurgitation. LV size is normal. In the limited visualization of the aortic valve, I cannot determine the etiology of the aortic regurgitation and cannot evaluate for presence or absence of vegetation.  Overall, it is unclear if this has any bearing on his presentation or prognosis.  Overall, I do not think this finding meaningfully changes prior decision making with regards to overall comfort measures.   Nigel Mormon, MD Pager: [phone] Office: [phone]

## 2022-10-26 NOTE — Progress Notes (Signed)
RN received critical lab of Trop 3101. Dr Lupita Leash made aware. Repeat EKG performed.  Cardiology and hospitalist at bedside. Orders received for transfer. RN called report

## 2022-10-26 NOTE — Consult Note (Signed)
CARDIOLOGY CONSULT NOTE  Patient ID: [name] MRN: [id number] DOB/AGE: [birth year] 67 y.o.  Admit date: 10/24/2022 Referring Physician: [facility] hospitalist Reason for Consultation:  Tachycardia  HPI:   67 y.o. African American male  with hypertension, HIV, h/o bladder cancer, h/o alcohol abuse, failure to thrive, admitted on 10/24/2022 with altered mental status.  Patient was found by his neighbor, not acting like himself, found in urine. Workup so far has been significant for severe cachexia, AKI Cr 5.78 on presentation (baseline around 1.0), now down to 2.18. albumin 2.0. Today, patient was having episodes of wide complex tachycardia associated with chest pain. EKG today showed new finding of age indeterminate septal infarct, which is new since previous EKG on 10/24/2022. Trop HS elevated at 3100, BNP at 1293. Cardiology consulted given above issues.  Palliative are has also been consulted due to his severe cachexia, failure to thrive, in addition to underlying other medical issues.  Based on palliative care note, it appears that patient lives in an apartment by himself, has an estranged wife in [place], daughter in [place], and son in [place]. His sister and bother live in [place]. It appears that he has indicated his wishes not to be resuscitated in an event of cardiac arrest and but would like a trial of mechanical ventilation in setting of a respiratory arrest.   On my arrival, patient's brother [name] present at bedside, along with primary attending Dr. Antonieta Pert.  Patient is arousable but very lethargic.  He is able to tell me that he is at hospital, though stated [facility] hospital.  He is not sure why he is in the hospital.  Eda Paschal aware of patient's baseline medical condition and failure to thrive.  Brother had not seen the patient in 1 month, but does notice that this is a significant decline from his baseline, which was already worse compared to a few years ago.  Of note, patient's HIV  diagnosis not known to anybody other than patient and brother [name], according to [employer].  They would like to keep this private.   Past Medical History:  Diagnosis Date   Anorexia    Asthma with COPD ([place])    Bladder cancer ([place])    and prostatic urethra cancer  HX TURBT'S IN [place], [place]  WITH INSTILLATION CHEMO TX'S   Bladder tumor    History of transfusion    HIV disease ([place])    under care of [facility]   Hypertension    Nocturia    Vitamin D deficiency      Past Surgical History:  Procedure Laterality Date   CATARACT EXTRACTION W/ INTRAOCULAR LENS  IMPLANT, BILATERAL  2014   FULGURATION OF BLADDER TUMOR Bilateral 05/31/2014   Procedure: BLADDER BIOPSY WITH FULGERATION BILATERAL RETROGRADE [id number];  Surgeon: Ardis Hughs, MD;  Location: [facility];  Service: Urology;  Laterality: Bilateral;   JOINT REPLACEMENT     LUMBAR DISC SURGERY  x2  last one 2008   TOTAL HIP ARTHROPLASTY Bilateral x each with revision's   last one for Right 2012/  last one for Left 2010   TRANSURETHRAL RESECTION OF BLADDER TUMOR  X3   last one 02/ 2015 ([place], [place])   TRANSURETHRAL RESECTION OF BLADDER TUMOR WITH GYRUS (TURBT-GYRUS) Bilateral 05/16/2015   Procedure: TRANSURETHRAL RESECTION OF BLADDER TUMOR WITH GYRUS (TURBT-GYRUS), bilateral retrograde pyelogram;  Surgeon: Ardis Hughs, MD;  [facility];  Service: Urology;  Laterality: Bilateral;   TRANSURETHRAL RESECTION OF  BLADDER TUMOR WITH GYRUS (TURBT-GYRUS) N/A 07/05/2015   Procedure: RE RESECTION OF BLADDER TUMOR (TURBT) RIGHT BLADDER NECK;  Surgeon: Ardis Hughs, MD;  [facility];  Service: Urology;  Laterality: N/A;      Family History  Problem Relation Age of Onset   Cancer Mother        Gastric Cancer   Hypertension Mother    Heart disease Mother    Cancer Maternal Grandmother        breast cancer      Social History: Social History   Socioeconomic  History   Marital status: Legally Separated    Spouse name: Not on file   Number of children: 6   Years of education: eleventh grade   Highest education level: 8th grade  Occupational History   Occupation: disabled    Comment: previously worked at [employer], Teacher, adult education care, farm, [place], [employer]  Tobacco Use   Smoking status: Some Days    Packs/day: 0.50    Years: 17.00    Additional pack years: 0.00    Total pack years: 8.50    Types: Cigarettes   Smokeless tobacco: Never   Tobacco comments:    3/day;  Vaping Use   Vaping Use: Never used  Substance and Sexual Activity   Alcohol use: Yes    Alcohol/week: 6.0 standard drinks of alcohol    Types: 6 Cans of beer per week    Comment: occasional beer   Drug use: Not Currently    Types: Marijuana    Comment: occassional   Sexual activity: Not Currently    Partners: Female    Birth control/protection: Condom  Other Topics Concern   Not on file  Social History Narrative   Not on file   Social Determinants of Health   Financial Resource Strain: Medium Risk (04/28/2019)   Overall Financial Resource Strain (CARDIA)    Difficulty of Paying Living Expenses: Somewhat hard  Food Insecurity: No Food Insecurity (09/25/2020)   Hunger Vital Sign    Worried About Running Out of Food in the Last Year: Never true    Ran Out of Food in the Last Year: Never true  Transportation Needs: Unmet Transportation Needs (08/24/2020)   PRAPARE - Hydrologist (Medical): Yes    Lack of Transportation (Non-Medical): No  Physical Activity: Inactive (04/28/2019)   Exercise Vital Sign    Days of Exercise per Week: 0 days    Minutes of Exercise per Session: 0 min  Stress: No Stress Concern Present (09/21/2019)   [place]    Feeling of Stress : Not at all  Social Connections: Socially Isolated (04/06/2020)   Social Connection and Isolation Panel [NHANES]    Frequency of  Communication with Friends and Family: Once a week    Frequency of Social Gatherings with Friends and Family: Once a week    Attends Religious Services: Never    Marine scientist or Organizations: Yes    Attends Archivist Meetings: Never    Marital Status: Separated  Intimate Partner Violence: Not At Risk (08/24/2020)   Humiliation, Afraid, Rape, and Kick questionnaire    Fear of Current or Ex-Partner: No    Emotionally Abused: No    Physically Abused: No    Sexually Abused: No     Medications Prior to Admission  Medication Sig Dispense Refill Last Dose   albuterol (VENTOLIN HFA) 108 (90 Base) MCG/ACT  inhaler INHALE 2 PUFFS BY MOUTH INTO THE LUNGS EVERY 6 HOURS AS NEEDED FOR WHEEZING OR SHORTNESS OF BREATH 6.7 g 1    amLODipine (NORVASC) 5 MG tablet Take 1 tablet (5 mg total) by mouth daily. 30 tablet 1    atenolol (TENORMIN) 100 MG tablet Take 1 tablet (100 mg total) by mouth daily. 30 tablet 2    ciprofloxacin (CIPRO) 500 MG tablet Take 1 tablet by mouth twice daily 14 tablet 0    Darunavir-Cobicistat-Emtricitabine-Tenofovir Alafenamide (SYMTUZA) 800-150-200-10 MG TABS Take 1 tablet by mouth daily with breakfast. 30 tablet 0    Ensure (ENSURE) Take 1 Can by mouth 2 (two) times daily between meals. 237 mL 12    ipratropium-albuterol (DUONEB) 0.5-2.5 (3) MG/3ML SOLN Inhale 3 mLs (1 vial) by nebulization every 6 (six) hours as needed (wheezing and chest tightness). 360 mL 1    phenazopyridine (PYRIDIUM) 200 MG tablet Take 1 tablet by mouth every 8 hours as needed painful urination 20 tablet 0     Review of Systems  Unable to perform ROS: Other  Poor historian, does endorse chest pain    Physical Exam: Physical Exam Vitals and nursing note reviewed.  Constitutional:      General: He is not in acute distress.    Appearance: He is cachectic. He is ill-appearing.  Neck:     Vascular: No JVD.  Cardiovascular:     Rate and Rhythm: Normal rate and regular rhythm.      Heart sounds: Normal heart sounds. No murmur heard. Pulmonary:     Effort: Pulmonary effort is normal.     Breath sounds: Normal breath sounds. No wheezing or rales.  Musculoskeletal:     Right lower leg: No edema.     Left lower leg: No edema.  Neurological:     Comments: Arousable but lethargic        Imaging/tests reviewed and independently interpreted: Lab Results: CBC, BMP, trop HS, BNP  CXR 10/26/2022: FINDINGS: The heart size and mediastinal contours are within normal limits. Both lungs are clear. The visualized skeletal structures are unremarkable.   IMPRESSION: No active disease.  Cardiac Studies:  Telemetry 10/26/2022: Multiple episodes of narrow complex tachycardia, likely SVT      EKG 10/24/2022: Sinus tachycardia 117 bpm Right atrial enlargement LVH with secondary repolarization  EKG 10/26/2022: Sinus tachycardia 116 bpm Septal infarct, age indeterminate (new since previous EKG on 10/24/2022) Anterolateral ischemia  Echocardiogram: Ordered   Assessment & Recommendations:  67 y.o. African American male  with hypertension, HIV, h/o bladder cancer, h/o alcohol abuse, failure to thrive, admitted on 10/24/2022 with altered mental status.  Cardiology consulted due to tachycardia, chest pain, troponin elevation.  ACS: Although not necessarily cause for patient's presentation, it does appear that patient may have had an infarct at some point in the last 48 hours.  There is indeterminate septal infarct on EKG today, that was not present on 10/24/2022.  Patient is a poor historian and therefore very difficult to elicit the timing of his chest pain onset.  High sensitive troponin >3000, BNP 1200.  Patient did have hypotension 74/51 mmHg with sublingual nitroglycerin.  Patient now being started on heparin, reportedly allergic to aspirin.  Patient has poor baseline functional status with medical comorbidities, and severe cachexia and failure to thrive.  Although  creatinine is improving, he still has significant AKI, likely prerenal due to dehydration.  Putting in contacts patient's baseline status, I do not think benefits of invasive management including coronary  angiography and possible intervention, outweigh the risks.  Patient is unable to follow any meaningful conversation to make decisions.  In the situation, I discussed the situation at length with patient's brother [name], who reportedly was in the process of being patient's alternate decision-maker.  [name], Dr. Antonieta Pert mutually agreed on decision that we will change her goals of care to continued medical treatment without escalation, as well as DNR status.  Patient does remain at significant risk of further clinical deterioration, both from his medical comorbidities, as well as cardiac standpoint.  Unfortunately, his chances of meaningful reversible recovery are minimal.  At this time, we may continue heparin from ACS therapy standpoint.  Okay to use IV fluids for hypotension.  Should he have any recurrent arrhythmias, could use amiodarone.  However, we will not escalate treatment beyond this.  He is not a candidate for coronary angiography and intervention, or any resuscitative measures.  I will be available, if there is any unexpected change in patient's medical or cardiac status.   Discussed interpretation of tests and management recommendations with the primary team     Nigel Mormon, MD Pager: [phone] Office: [phone]

## 2022-10-26 NOTE — Progress Notes (Signed)
Dr Lupita Leash notified of pt rhythm of 200 SVT sustained. Physician at bedside, orders received.

## 2022-10-26 NOTE — Progress Notes (Signed)
ANTICOAGULATION CONSULT NOTE  Pharmacy Consult for heparin Indication: chest pain/ACS  Allergies  Allergen Reactions   Aspirin Shortness Of Breath, Nausea And Vomiting and Swelling    Patient Measurements: Height: 5\' 4"  (162.6 cm) Weight: 43.8 kg (96 lb 9.6 oz) IBW/kg (Calculated) : 59.2 Heparin Dosing Weight: TBW  Vital Signs: Temp: 98.1 F (36.7 C) (03/24 2055) Temp Source: Oral (03/24 2055) BP: 102/53 (03/24 2055) Pulse Rate: 85 (03/24 2055)  Labs: Recent Labs    10/25/2022 2016 10/13/2022 2021 10/25/22 0238 10/26/22 0144 10/26/22 0912 10/26/22 1202 10/26/22 2051  HGB 13.2 13.3 10.4* 9.6*  --   --   --   HCT 38.9* 39.0 30.4* 28.5*  --   --   --   PLT 234  --  186 151  --   --   --   HEPARINUNFRC  --   --   --   --   --   --  0.40  CREATININE 5.78*  --  4.67* 2.18*  --   --   --   TROPONINIHS  --   --   --   --  3,101* 2,292*  --      Estimated Creatinine Clearance: 19.8 mL/min (A) (by C-G formula based on SCr of 2.18 mg/dL (H)).  Assessment: 59 yom admitted with AMS and AKI. Pharmacy consulted for IV heparin for ACS/chest pain. PMH: COPD, HIV, HTN, bladder tumor. He had enoxaparin 30 mg at 09:53. Troponin 3101. AKI improving, SCr down to 2.18. No bleeding noted, Hgb down 9.6 (likely dilutional), platelets are down to 151.  Heparin level therapeutic (0.4) on infusion at 600 units/hr. No bleeding noted.  Goal of Therapy:  Heparin level 0.3-0.7 units/ml Monitor platelets by anticoagulation protocol: Yes   Plan:  Continue IV heparin at 600 units/hr  Will f/u a.m. heparin level to confirm therapeutic  Thank you for involving pharmacy in this patient's care.  Sherlon Handing, PharmD, BCPS Please see amion for complete clinical pharmacist phone list 10/26/2022 9:27 PM

## 2022-10-26 NOTE — Progress Notes (Signed)
PROGRESS NOTE [name]  [id number] DOB: [birth year] DOA: 10/08/2022 PCP: Fenton Foy, NP  Brief Narrative/Hospital Course: 67 year old male with reported COPD, HIV, hypertension, bladder tumor alcohol use brought by EMS for altered mental status after a neighbor called for him as he was not acting like himself, smell of urine and a urine bottles everywhere and he has had urinary retention x 5 days. In the ED patient is alert oriented to self place year, limited historian, complaining of a cough and shortness of breath recently and some pain in lower abdomen near his penis, admitted he does drink alcohol and last drink was several days ago.  Vitals w/ HR 135, afebrile, on RA saturating well,  found to have severe dehydration, failure to thrive, AKI w. Creat 5.7, hco3 14, ast 63, bladder scan negative, chest x-ray and CT head no acute finding.  He was given 2 L bolus UA and ultrasound obtained and admitted for further management.   Subjective: Seen and examined. Overnight vitals/labs/events reviewed  Overnight patient is afebrile labs shows creatinine improving to 2.1 BUN 63 Cachectic and c/o chest pain on and off, sometime leg pain This am having recurrent tachy/SVT/NSVT- and was having chest pain with one of the episodes per RN Cardio contacted urgently-labs/meds ordered.  Urgently called his Brother [name]- who then arrived at bedside and I met with him at bedside later in the morning  Assessment and Plan: Principal Problem:   AKI (acute kidney injury) ([place]) Active Problems:   Essential hypertension   Asthma, chronic   Malignant neoplasm of bladder ([place])   HIV disease ([place])   Depression   Unintentional weight loss   Severe AKI Dehydration Metabolic acidosis due to AKI: Bladder scan negative, renal ultrasound chronic medical renal disease without hydronephrosis.Creat improving with IV fluid > cut to 50 cc/hr bnp elevated this am cxr clear tho,hydration, monitor electrolytes,  urine output. AKI is most likely prerenal.Avoid nephrotoxic medication,dose meds renally.  Recent Labs    12/31/21 1647 04/23/22 1534 10/15/2022 2016 10/25/22 0238 10/26/22 0144  BUN 14 12 102* 100* 63*  CREATININE 1.06 1.01 5.78* 4.67* 2.18*   NSTEMI Elevated troponin with ST and T wave changes in anterior leads-NSTEMI Tachycardia w/ narrow complex tachycardia/NSVT: This morning having recurrent TACHYCARDIA heart rate up to 200 at times, placed on metoprolol 25 twice daily sinus tachycardia has improved heart rate in 90s, troponin elevated in 3000 , bnp 1200,discussed and consulted Dr. Posey Pronto from cardiology starting heparin drip, he is allergic to aspirin, start Crestor, ordered echocardiogram.Given his AKI not a candidate for invasive cardiac studies at this point and on discussion with his brother- no escalation f care  but will need to see how his creatinine does over the next several days, palliative care only meeting with him and his family/siblings today.  Patient had chest pain given nitroglycerin follow with hypotensive in 70s bolus ordered reassessed after bolus BP improved to 0000000 systolic. Amiodarone bolus/infusion ordered as well  Alcohol abuse: Level less than 10, no withdrawals,watch for withdrawal symptoms and address accordingly, patient last drink was several days ago. Cont thiamine folate vitamins-monitor  ?Seizure disorder per his brother- not on AEDS. Monitor  Poor historian/altered mental status with confusion likely acute metabolic encephalopathy: Continue supportive care gentle hydration HIV disease followed by ID last CD4 count stable /viral load elevated as below.Continue home [place].  Per [name] his brother- patient's rest of the family-sisters  not aware of this diagnosis and only [name] is aware who has been  helping him with his care.   Lab Results  Component Value Date   CD4TABS 405 02/18/2022   CD4TABS 553 12/31/2021   CD4TABS 443 04/02/2021   Lab  Results  Component Value Date   HIV1RNAQUANT 109,000 (H) 02/18/2022     Asthma:not wheezing, not in exacerbation. Depression: Mood is stable Hypertension BP controlled on home amlodipine> will discontinue for now as placed him on metoprolol Anemia likely from chronic disease HIV, initial hemoglobin high at 13 g likely hemoconcentrated.  His hemoglobin was in low 10 g 6 months ago- monitor hb Recent Labs  Lab 10/05/2022 2016 10/23/2022 2021 10/25/22 0238 10/26/22 0144  HGB 13.2 13.3 10.4* 9.6*  HCT 38.9* 39.0 30.4* 28.5*    History of bladder cancer-reportedly patient was having hematuria on last follow-up with ID, question of recurrent bladder cancer, will need to monitor closely while on heparin.  Severe malnutrition BMI 16.9,dietitian consulted-augment aggressively status.Speech eval for swallowing.  Spitting during eating and taking meds:speech eval requested- cont on modified diet.  Goals of care:overall prognosis appears guarded to poor.  Palliative care has been consulted.  Family meeting being planned today, I did mention to his brother about his overall condition. Dr Virgina Jock and myself had extensive discussion with patient's brother [name] at the bedside he tells [place] that rest the family are not aware about his HIV diagnosis and patient had kept it confidential and only [name] is aware, patient would not want resuscitation intubation and given his overall medical condition agrees to change to DNR, he has been communicating with sister- unfortunately one of the sisters could not come and the oen in [facility] is planning to come. Per discussion no escalation of care but continue with supportive measures IV heparin IV fluids.If patient decompensates further will benefit with comfort measures per discussion-awaiting further palliative care meeting today.  Patient is high risk for decompensation/readmission Transfer him to progressive care  Pressure ulcer STAGE 2  left buttock POA see  below Pressure Injury 10/25/22 Buttocks Left Stage 2 -  Partial thickness loss of dermis presenting as a shallow open injury with a red, pink wound bed without slough. (Active)  10/25/22 0100  Location: Buttocks  Location Orientation: Left  Staging: Stage 2 -  Partial thickness loss of dermis presenting as a shallow open injury with a red, pink wound bed without slough.  Wound Description (Comments):   Present on Admission: Yes  Dressing Type Foam - Lift dressing to assess site every shift 10/25/22 0111   DVT prophylaxis: enoxaparin (LOVENOX) injection 30 mg Start: 10/25/22 1000 Code Status:   Code Status: Full Code Family Communication: plan of care discussed with patient at bedside. Patient status is: Inpatient because of AKI Level of care: Telemetry Medical > transfer to progressive care.  Dispo: The patient is from: Home            Anticipated disposition: TBD Objective: Vitals last 24 hrs: Vitals:   10/26/22 0442 10/26/22 0442 10/26/22 0448 10/26/22 0738  BP: (!) 152/70 (!) 152/70  125/74  Pulse: (!) 102 (!) 101  (!) 120  Resp: 18 18 19    Temp: 98.4 F (36.9 C) 98.4 F (36.9 C)    TempSrc:  Oral    SpO2:  99%  100%  Weight:   48 kg   Height:       Weight change: -10.5 kg  Physical Examination: General exam: weak frail, alert oriented to self place people, is  cachectic, thin, frail. HEENT:Oral mucosa moist, Ear/Nose WNL  grossly, dentition normal. Respiratory system: bilaterally ckear BS, no use of accessory muscle. Cardiovascular system: S1 & S2 +, regular rate. Gastrointestinal system: Abdomen soft, scaphoid ,ND,BS+ Nervous System:Alert, awake, moving extremities and grossly nonfocal Extremities: LE ankle edema neg.m, lower extremities warm Skin: No rashes,no icterus. MSK: all muscle bulk,tone, power.   Medications reviewed:  Scheduled Meds:  amLODipine  5 mg Oral Daily   Darunavir-Cobicistat-Emtricitabine-Tenofovir Alafenamide  1 tablet Oral Q breakfast    enoxaparin (LOVENOX) injection  30 mg Subcutaneous [id number]   folic acid  1 mg Oral Daily   thiamine  100 mg Oral Daily   Continuous Infusions:  dextrose 5 % and 0.9% NaCl 125 mL/hr at 10/26/22 0203   Diet Order             Diet clear liquid Room service appropriate? Yes; Fluid consistency: Nectar Thick; Fluid restriction: 1200 mL Fluid  Diet effective now                  Intake/Output Summary (Last 24 hours) at 10/26/2022 0814 Last data filed at 10/26/2022 0602 Gross per 24 hour  Intake 60 ml  Output 850 ml  Net -790 ml    Net IO Since Admission: 770.89 mL [10/26/22 0814]  Wt Readings from Last 3 Encounters:  10/26/22 48 kg  04/23/22 54.4 kg  02/18/22 54 kg     Unresulted Labs (From admission, onward)     Start     Ordered   10/16/2022 0500  Creatinine, serum  (enoxaparin (LOVENOX)    CrCl >/= 30 ml/min)  Weekly,   R     Comments: while on enoxaparin therapy    10/13/2022 2310   10/26/22 [id number]  Basic metabolic panel  Daily,   R      10/25/22 0759   10/26/22 0500  CBC  Daily,   R      10/25/22 0759   10/18/2022 1930  Urinalysis, Routine w reflex microscopic -Urine, Clean Catch  Once,   URGENT       Question:  Specimen Source  Answer:  Urine, Clean Catch   10/16/2022 1930   11/02/2022 1930  Rapid urine drug screen (hospital performed)  Once,   STAT        10/30/2022 1930          Data Reviewed: I have personally reviewed following labs and imaging studies CBC: Recent Labs  Lab 10/22/2022 2016 10/04/2022 2021 10/25/22 0238 10/26/22 0144  WBC 8.7  --  8.1 4.5  NEUTROABS 7.0  --   --   --   HGB 13.2 13.3 10.4* 9.6*  HCT 38.9* 39.0 30.4* 28.5*  MCV 102.1*  --  100.3* 101.1*  PLT 234  --  186 [id number]    Basic Metabolic Panel: Recent Labs  Lab 10/30/2022 2016 10/03/2022 2021 10/25/22 0238 10/26/22 0144  NA 134* 139 136 143  K 4.8 5.1 5.0 3.7  CL 102  --  108 121*  CO2 14*  --  17* 17*  GLUCOSE 130*  --  112* 143*  BUN 102*  --  100* 63*  CREATININE 5.78*  --  4.67* 2.18*   CALCIUM 9.4  --  8.5* 7.9*  PHOS  --   --  7.3*  --     GFR: Estimated Creatinine Clearance: 21.7 mL/min (A) (by C-G formula based on SCr of 2.18 mg/dL (H)). Liver Function Tests: Recent Labs  Lab 11/02/2022 2016 10/25/22 0238  AST 63*  --   ALT  30  --   ALKPHOS 80  --   BILITOT 1.0  --   PROT 11.2*  --   ALBUMIN 2.7* 2.0*   CBG: Recent Labs  Lab 10/07/2022 2251  GLUCAP 132*    Recent Results (from the past 240 hour(s))  Resp panel by RT-PCR (RSV, Flu A&B, Covid) Anterior Nasal Swab     Status: None   Collection Time: 10/13/2022  9:21 PM   Specimen: Anterior Nasal Swab  Result Value Ref Range Status   SARS Coronavirus 2 by RT PCR NEGATIVE NEGATIVE Final   Influenza A by PCR NEGATIVE NEGATIVE Final   Influenza B by PCR NEGATIVE NEGATIVE Final    Comment: (NOTE) The Xpert Xpress SARS-CoV-2/FLU/RSV plus assay is intended as an aid in the diagnosis of influenza from Nasopharyngeal swab specimens and should not be used as a sole basis for treatment. Nasal washings and aspirates are unacceptable for Xpert Xpress SARS-CoV-2/FLU/RSV testing.  Fact Sheet for Patients: EntrepreneurPulse.com.au  Fact Sheet for Healthcare Providers: IncredibleEmployment.be  This test is not yet approved or cleared by the [place] FDA and has been authorized for detection and/or diagnosis of SARS-CoV-2 by FDA under an Emergency Use Authorization (EUA). This EUA will remain in effect (meaning this test can be used) for the duration of the COVID-19 declaration under Section 564(b)(1) of the Act, 21 U.S.C. section 360bbb-3(b)(1), unless the authorization is terminated or revoked.     Resp Syncytial Virus by PCR NEGATIVE NEGATIVE Final    Comment: (NOTE) Fact Sheet for Patients: EntrepreneurPulse.com.au  Fact Sheet for Healthcare Providers: IncredibleEmployment.be  This test is not yet approved or cleared by the  [place] FDA and has been authorized for detection and/or diagnosis of SARS-CoV-2 by FDA under an Emergency Use Authorization (EUA). This EUA will remain in effect (meaning this test can be used) for the duration of the COVID-19 declaration under Section 564(b)(1) of the Act, 21 U.S.C. section 360bbb-3(b)(1), unless the authorization is terminated or revoked.  Performed at [facility], [place] [address], [place],  [address]     Antimicrobials: Anti-infectives (From admission, onward)    Start     Dose/Rate Route Frequency Ordered Stop   10/25/22 0845  Darunavir-Cobicistat-Emtricitabine-Tenofovir Alafenamide (SYMTUZA) 800-150-200-10 MG TABS 1 tablet        1 tablet Oral Daily with breakfast 10/18/2022 2310        Culture/Microbiology    Component Value Date/Time   SDES BLOOD RIGHT FOREARM 09/10/2016 0319   SPECREQUEST BOTTLES DRAWN AEROBIC AND ANAEROBIC 10CC EA 09/10/2016 0319   CULT NO GROWTH 5 DAYS 09/10/2016 0319   REPTSTATUS 09/15/2016 FINAL 09/10/2016 0319   Other culture-see note  Radiology Studies: DG Swallowing Func-Speech Pathology  Result Date: 10/25/2022 Table formatting from the original result was not included. Modified Barium Swallow Study Patient Details Name: Zamarion Evert MRN: [id number] Date of Birth: 11/19/1955 Today's Date: 10/25/2022 HPI/PMH: HPI: Pt is a 67 y.o. 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 Pt is generally alert and oriented, presenting generally lethargic with reduced intelligibility. Pt reports difficulty when swallowing liquids and solids. Pt is currently on a renal diet is a concern for malnutrition and  reduced hydration., with a fluid restriction of 1200 mL. Chest CXR on 10/13/2022 revealed Emphysematous changes in the lungs and no evidence of active pulmonary disease. Clinical Impression: Clinical Impression: Patient presents with a mild to moderate oral phase dysphagia and a mod-severe pharyngeal phase dysphagia. Both oral and pharyngeal phase of swallow appear significantly impacted by oral motor and pharyngeal musculature weakness. In addition, appearance of suspected edema of posterior pharynx and arytenoids resulted in decreased epiglottic inversion and decreased PES opening. With thin liquids, patient exhibited delayed oral and pharyngeal phase, followed by aspiration after the initial swallow from residuals left in pharynx after incomplete bolus transit through PES. Patient exhibited similar delays in oral and pharyngeal phase of swallow, slightly increased amount of pharyngeal residuals (diffuse) but no penetration or aspiration prior to, during or after swallow. Patient able to swallow bite of puree solid, however this resulted in greater amount of pharyngeal residuals, with patient eventually coughing and expectorating majority of puree bolus. Although aspiration was silent, patient did have effective cough to clear penetrate even when at level or just below vocal cords. SLP suspects that pharyngeal secretions are impacting patient's swallow function and could also be the result of pharyngeal phase dysphagia. Patient is at a high risk of aspiration, malnutrition and dehydration. SLP recommending clear liquids/nectar thick at this time and will continue to follow patient for PO recommendations and ability to tolerate. Factors that may increase risk of adverse event in presence of aspiration ([place] 2021): Factors that may increase risk of adverse event in presence of aspiration ([place] 2021): Frail or deconditioned; Inadequate oral hygiene; Poor general health and/or compromised immunity  Recommendations/Plan: Swallowing Evaluation Recommendations Swallowing Evaluation Recommendations Recommendations: PO diet PO Diet Recommendation: Clear liquid diet; Mildly thick liquids (Level 2, nectar thick) Liquid Administration via: Cup; Straw Medication Administration: Crushed with puree Supervision: Intermittent supervision/cueing for swallowing strategies; Set-up assistance for safety Swallowing strategies  : Slow rate; Small bites/sips; Clear throat intermittently Postural changes: Position pt fully upright for meals; Stay upright 30-60 min after meals Oral care recommendations: Oral care BID (2x/day); Staff/trained caregiver to provide oral care Treatment Plan Treatment Plan Treatment recommendations: Therapy as outlined in treatment plan below Follow-up recommendations: Follow physicians's recommendations for discharge plan and follow up therapies Functional status assessment: Patient has had a recent decline in their functional status and demonstrates the ability to make significant improvements in function in a reasonable and predictable amount of time. Treatment frequency: Min 2x/week Treatment duration: 2 weeks Interventions: Compensatory techniques; Patient/family education; Trials of upgraded texture/liquids; Diet toleration management by SLP Recommendations Recommendations for follow up therapy are one component of a multi-disciplinary discharge planning process, led by the attending physician.  Recommendations may be updated based on patient status, additional functional criteria and insurance authorization. Assessment: Orofacial Exam: Orofacial Exam Oral Cavity: Oral Hygiene: Pooled secretions Oral Cavity - Dentition: Edentulous Orofacial Anatomy: WFL Oral Motor/Sensory Function: WFL Anatomy: Anatomy: WFL Thin Liquids: Thin Liquids (Level 0) Thin Liquids : Impaired Bolus delivery method: Spoon; Cup Thin Liquid - Impairment: Pharyngeal impairment; Oral Impairment Lip Closure: Interlabial escape,  no progression to anterior lip Tongue control during bolus hold: Not tested Bolus transport/lingual motion: Slow tongue motion Oral residue: Trace residue lining oral structures Location of oral residue : Tongue Initiation of swallow : Pyriform sinuses Soft palate elevation: Trace column of contrast or air between SP and PW Laryngeal elevation: Partial superior movement of thyroid cartilage/partial approximation of arytenoids to epiglottic petiole Anterior hyoid excursion:  Partial Epiglottic movement: Partial Laryngeal vestibule closure: Incomplete, narrow column air/contrast in laryngeal vestibule Pharyngeal stripping wave : Present - complete Pharyngeal contraction (A/P view only): N/A Pharyngoesophageal segment opening: Minimal distention/minimal duration, marked obstruction of flow Tongue base retraction: Narrow column of contrast or air between tongue base and PPW Pharyngeal residue: Majority of contrast within or on pharyngeal structures Location of pharyngeal residue: Valleculae; Aryepiglottic folds; Pyriform sinuses Penetration/Aspiration Scale (PAS) score: 8.  Material enters airway, passes BELOW cords without attempt by patient to eject out (silent aspiration)  Mildly Thick Liquids: Mildly thick liquids (Level 2, nectar thick) Mildly thick liquids (Level 2, nectar thick): Impaired Bolus delivery method: Spoon; Cup Mildly Thick Liquid - Impairment: Oral Impairment; Pharyngeal impairment Initiation of swallow : Valleculae Soft palate elevation: No bolus between soft palate (SP)/pharyngeal wall (PW) Laryngeal elevation: Partial superior movement of thyroid cartilage/partial approximation of arytenoids to epiglottic petiole Anterior hyoid excursion: Partial Epiglottic movement: Partial Laryngeal vestibule closure: Incomplete, narrow column air/contrast in laryngeal vestibule Pharyngeal stripping wave : Present - complete Pharyngeal contraction (A/P view only): N/A Pharyngoesophageal segment opening: Minimal  distention/minimal duration, marked obstruction of flow Tongue base retraction: Trace column of contrast or air between tongue base and PPW Location of pharyngeal residue: Valleculae; Aryepiglottic folds; Pyriform sinuses Penetration/Aspiration Scale (PAS) score: 1.  Material does not enter airway  Moderately Thick Liquids: Moderately thick liquids (Level 3, honey thick) Moderately thick liquids (Level 3, honey thick): Not Tested  Puree: Puree Puree: Impaired Puree - Impairment: Oral Impairment; Pharyngeal impairment Initiation of swallow: Valleculae Soft palate elevation: No bolus between soft palate (SP)/pharyngeal wall (PW) Laryngeal elevation: Partial superior movement of thyroid cartilage/partial approximation of arytenoids to epiglottic petiole Anterior hyoid excursion: Partial Epiglottic movement: Partial Laryngeal vestibule closure: Complete, no air/contrast in laryngeal vestibule Pharyngeal stripping wave : Present - complete Pharyngoesophageal segment opening: Minimal distention/minimal duration, marked obstruction of flow Tongue base retraction: Trace column of contrast or air between tongue base and PPW Pharyngeal residue: Majority of contrast within or on pharyngeal structures Location of pharyngeal residue: Valleculae; Aryepiglottic folds; Pyriform sinuses; Pharyngeal wall Penetration/Aspiration Scale (PAS) score: 1.  Material does not enter airway Solid: Solid Solid: Not Tested Pill: Pill Pill: Not Tested Compensatory Strategies: No data recorded  General Information: Caregiver present: No  Diet Prior to this Study: Dysphagia 2 (finely chopped); Moderately thick liquids (Level 3, honey thick)   Temperature : Normal   Respiratory Status: WFL   Supplemental O2: None (Room air)   History of Recent Intubation: No  Behavior/Cognition: Alert; Cooperative Self-Feeding Abilities: Needs assist with self-feeding Baseline vocal quality/speech: Normal Volitional Cough: Able to elicit Volitional Swallow: Able to  elicit Exam Limitations: No limitations Goal Planning: Prognosis for improved oropharyngeal function: Fair Barriers to Reach Goals: Severity of deficits; Time post onset No data recorded No data recorded Consulted and agree with results and recommendations: Patient Pain: Pain Assessment Pain Assessment: No/denies pain End of Session: Start Time:SLP Start Time (ACUTE ONLY): 1500 Stop Time: SLP Stop Time (ACUTE ONLY): 1525 Time Calculation:SLP Time Calculation (min) (ACUTE ONLY): 25 min Charges: SLP Evaluations $ SLP Speech Visit: 1 Visit SLP Evaluations $BSS Swallow: 1 Procedure $MBS Swallow: 1 Procedure SLP visit diagnosis: SLP Visit Diagnosis: Dysphagia, oropharyngeal phase (R13.12) Past Medical History: Past Medical History: Diagnosis Date  Anorexia   Asthma with COPD ([place])   Bladder cancer ([facility])   and prostatic urethra cancer  HX TURBT'S IN [place], [place]  WITH INSTILLATION CHEMO TX'S  Bladder tumor   History of  transfusion   HIV disease ([place])   under care of [facility]  Hypertension   Nocturia   Vitamin D deficiency  Past Surgical History: Past Surgical History: Procedure Laterality Date  CATARACT EXTRACTION W/ INTRAOCULAR LENS  IMPLANT, BILATERAL  2014  FULGURATION OF BLADDER TUMOR Bilateral 05/31/2014  Procedure: BLADDER BIOPSY WITH FULGERATION BILATERAL RETROGRADE [id number];  Surgeon: Ardis Hughs, MD;  Location[facility];  Service: Urology;  Laterality: Bilateral;  JOINT REPLACEMENT    LUMBAR DISC SURGERY  x2  last one 2008  TOTAL HIP ARTHROPLASTY Bilateral x each with revision's  last one for Right 2012/  last one for Left 2010  TRANSURETHRAL RESECTION OF BLADDER TUMOR  X3   last one 02/ 2015 ([place], )  TRANSURETHRAL RESECTION OF BLADDER TUMOR WITH GYRUS (TURBT-GYRUS) Bilateral 05/16/2015  Procedure: TRANSURETHRAL RESECTION OF BLADDER TUMOR WITH GYRUS (TURBT-GYRUS), bilateral retrograde pyelogram;  Surgeon: Ardis Hughs, MD;  Location: [facility];  Service: Urology;  Laterality: Bilateral;  TRANSURETHRAL RESECTION OF BLADDER TUMOR WITH GYRUS (TURBT-GYRUS) N/A 07/05/2015  Procedure: RE RESECTION OF BLADDER TUMOR (TURBT) RIGHT BLADDER NECK;  Surgeon: Ardis Hughs, MD;  Location: [facility];  Service: Urology;  Laterality: N/A; Sonia Baller, MA, CCC-SLP Speech Therapy   US Renal  Result Date: 10/25/2022 CLINICAL DATA:  Acute kidney injury.  Urinary retention. EXAM: RENAL / URINARY TRACT ULTRASOUND COMPLETE COMPARISON:  None Available. FINDINGS: Right Kidney: Renal measurements: 7.8 x 3.7 x 6 cm = volume: 89 mL. Diffusely increased parenchymal echotexture with diffuse parenchymal atrophy. Changes suggest chronic medical renal disease. Small simple appearing cyst in the upper pole measuring 1 cm diameter. No imaging follow-up is indicated. No hydronephrosis or hydroureter. Left Kidney: Renal measurements: 9.6 x 4.4 x 6.5 cm = volume: 141 mL. Diffusely increased renal parenchymal echotexture suggesting chronic medical renal disease. No focal lesions identified. No hydronephrosis or hydroureter. Bladder: Bladder is decompressed with a current volume of 18 mL. No stone or filling defect is identified. Other: None. IMPRESSION: 1. Increased parenchymal echotexture of the kidneys consistent with chronic medical renal disease. No hydronephrosis. Electronically Signed   By: Lucienne Capers M.D.   On: 10/25/2022 00:08   DG Chest 2 View  Result Date: 10/22/2022 CLINICAL DATA:  Cough. EXAM: CHEST - 2 VIEW COMPARISON:  02/22/2018 FINDINGS: Heart size and pulmonary vascularity are normal. Pulmonary hyperinflation likely representing emphysematous change. Scattered fibrosis in the lungs. No consolidation or airspace disease. No pleural effusions. No pneumothorax. Degenerative changes in the spine. IMPRESSION: Emphysematous changes in the lungs. No evidence of active pulmonary disease. Electronically Signed   By: Lucienne Capers M.D.   On: 10/16/2022 21:46    CT HEAD WO CONTRAST  Result Date: 10/25/2022 CLINICAL DATA:  Altered mental status EXAM: CT HEAD WITHOUT CONTRAST TECHNIQUE: Contiguous axial images were obtained from the base of the skull through the vertex without intravenous contrast. RADIATION DOSE REDUCTION: This exam was performed according to the departmental dose-optimization program which includes automated exposure control, adjustment of the mA and/or kV according to patient size and/or use of iterative reconstruction technique. COMPARISON:  None Available. FINDINGS: Brain: Normal anatomic configuration. Moderate parenchymal volume loss is slightly greater than would be typically expected for age. Mild periventricular white matter changes are present likely reflecting the sequela of small vessel ischemia. No abnormal intra or extra-axial mass lesion or fluid collection. No abnormal mass effect or midline shift. No evidence of acute intracranial hemorrhage or infarct. Mild ventriculomegaly  appears commensurate with the degree of parenchymal volume loss and likely reflects the sequela of central atrophy. Cerebellum unremarkable. Vascular: No asymmetric hyperdense vasculature at the skull base. Skull: Intact Sinuses/Orbits: Postsurgical changes within the visualized paranasal sinuses with mild mucosal thickening noted within the residual ethmoid air cells and left maxillary sinus. No air-fluid levels. Orbits are unremarkable. Other: Mastoid air cells and middle ear cavities are clear. IMPRESSION: 1. No acute intracranial hemorrhage or infarct. 2. Moderate parenchymal volume loss, slightly greater than would be typically expected for age. 3. Mild periventricular white matter changes likely reflecting the sequela of small vessel ischemia. Electronically Signed   By: Fidela Salisbury M.D.   On: 10/18/2022 20:57     LOS: 2 days   Antonieta Pert, MD [facility]  10/26/2022, 8:14 AM

## 2022-10-26 NOTE — Progress Notes (Addendum)
Daily Progress Note   Patient Name: [name]       Date: 10/26/2022 DOB: [birth year]  Age: 67 y.o. MRN#: [id number] Attending Physician: Antonieta Pert, MD Primary Care Physician: Fenton Foy, NP Admit Date: 10/24/2022 Length of Stay: 2 days  Reason for Consultation/Follow-up: Establishing goals of care  HPI/Patient Profile:  67 y.o. male  with past medical history of COPD, HIV, bladder cancer s/p TURP and chemo, HTN, and alcohol use disorder who presented to [facility] ED on 10/24/2022 with altered mental status.  He was found by neighbor not acting like himself, smelling of urine, and with urine bottles everywhere.  He was admitted with severe dehydration, failure to thrive, and AKI with creatinine 5.78.   Palliative Medicine was consulted for code status.  Subjective:   Subjective: Chart Reviewed. Updates received. Patient Assessed. Created space and opportunity for patient  and family to explore thoughts and feelings regarding current medical situation.  Today's Discussion: Today saw the patient at bedside.  Also present were his brother York Ram and Sister Ruby's husband (brother-in-law).  Sister Ralph Leyden was on the phone as well.  We had a discussion about the patient's current medical condition.  They know that he is malnourished, dehydrated, his heart is weak, and he is in kidney failure.  The patient's brother [name] states that he is primary Media planner, although he consults with all other family members.  He states that he has privy to parts of the patient's medical history (such as HIV) that other family numbers are not aware of because the patient specifically did not want anyone else to know.  He notes that the patient does have paperwork related to goals of care.  I asked if he could obtain these and bring them and it would be helpful.  After discussing with all family present everybody is in agreement that the patient would not want resuscitation.  They state that if it is  this time to let him go.  I shared that, while we doubted capacity at this time, his previous discussion with my colleague he noted that he did not want resuscitation but would be open to advanced respiratory interventions in the case of non-arrest respiratory distress.  We discussed that CODE STATUS does not change medical interventions being provided.  We discussed that we would have ongoing conversations depending on how the patient's clinical status progresses.  They understand and are in agreement.  I provided emotional and general support through therapeutic listening, empathy, sharing of stories, and other techniques. I answered all questions and addressed all concerns to the best of my ability.  ROS Limited due to mumbling and waxing/waning mental status Review of Systems  Constitutional:        Denies pain in general  Respiratory:  Negative for shortness of breath.   Cardiovascular:  Negative for chest pain.  Gastrointestinal:  Negative for abdominal pain, nausea and vomiting.    Objective:   Vital Signs:  BP (!) 108/56 (BP Location: Left Arm)   Pulse 95   Temp 98.3 F (36.8 C) (Oral)   Resp 20   Ht 5\' 4"  (1.626 m)   Wt 43.8 kg   SpO2 100%   BMI 16.58 kg/m   Physical Exam: Physical Exam Vitals and nursing note reviewed.  Constitutional:      General: He is sleeping. He is not in acute distress.    Appearance: He is cachectic. He is ill-appearing.  HENT:     Head: Normocephalic and atraumatic.  Cardiovascular:     Rate and Rhythm: Tachycardia present.  Pulmonary:     Effort: Pulmonary effort is normal. No respiratory distress.  Abdominal:     General: Abdomen is flat. Bowel sounds are normal. There is no distension.     Palpations: Abdomen is soft.     Tenderness: There is no abdominal tenderness.  Skin:    General: Skin is warm and dry.  Neurological:     Mental Status: He is easily aroused. He is disoriented and confused.  Psychiatric:        Mood and  Affect: Mood normal.        Behavior: Behavior normal.     Palliative Assessment/Data: 10-20%    Existing Vynca/ACP Documentation: None  Assessment & Plan:   Impression: Present on Admission:  Essential hypertension  Malignant neoplasm of bladder (HCC)  Unintentional weight loss  HIV disease (HCC)  Asthma, chronic  Depression  AKI (acute kidney injury) ([place])  Pressure injury of skin  67 year old male with chronic comorbidities and acute presentations described above.  His primary issue is severe AKI due to dehydration as well as NSTEMI with elevated troponin around 3000 and BNP at 1200.  Fortunately echocardiogram shows EF preserved at 50 to 55%/in the low normal.  He is on a heparin drip.  He also has HIV with some increase in his viral load, continuing home meds.  He is severely malnourished with a BMI of 16.9.  He appears cachectic today.  SLP eval pending.  Noted failure to thrive and altered mental status.  He presently lives at home alone but doubt this is tenable moving forward, if he survives this hospitalization.  Family appears to be all on board with DNR status.  They also agree that he would not want a feeding tube.  At this point with CODE STATUS addressed per consult, we will allow time for outcomes to determine any need for ongoing conversations.  SUMMARY OF RECOMMENDATIONS   DNR Continue full scope of care otherwise No feeding tube Keep family apprised of clinical situation to allow discussion of desired interventions Time for outcomes PMT will follow-up on 10/28/2022 Please notify us of any significant clinical change prior to then  Symptom Management:  Per primary team PMT is available to assist as needed  Code Status: DNR  Prognosis: Unable to determine  Discharge Planning: To Be Determined  Discussed with: Patient, family, medical team, nursing team  Thank you for allowing [place] to participate in the care of Nicola Fehlman PMT will continue to support  holistically.  Time Total: 75 min  Visit consisted of counseling and education dealing with the complex and emotionally intense issues of symptom management and palliative care in the setting of serious and potentially life-threatening illness. Greater than 50%  of this time was spent counseling and coordinating care related to the above assessment and plan.  Walden Field, NP Palliative Medicine Team  Team Phone # [phone] (Nights/Weekends)  04/02/2021, 8:17 AM

## 2022-10-26 NOTE — Progress Notes (Signed)
ANTICOAGULATION CONSULT NOTE - Initial Consult  Pharmacy Consult for heparin Indication: chest pain/ACS  Allergies  Allergen Reactions   Aspirin Shortness Of Breath, Nausea And Vomiting and Swelling    Patient Measurements: Height: 5\' 4"  (162.6 cm) Weight: 48 kg (105 lb 13.1 oz) IBW/kg (Calculated) : 59.2 Heparin Dosing Weight: TBW  Vital Signs: Temp: 98.4 F (36.9 C) (03/24 0442) Temp Source: Oral (03/24 0442) BP: 125/74 (03/24 0738) Pulse Rate: 120 (03/24 0738)  Labs: Recent Labs    10/20/2022 2016 10/25/2022 2021 10/25/22 0238 10/26/22 0144 10/26/22 0912  HGB 13.2 13.3 10.4* 9.6*  --   HCT 38.9* 39.0 30.4* 28.5*  --   PLT 234  --  186 151  --   CREATININE 5.78*  --  4.67* 2.18*  --   TROPONINIHS  --   --   --   --  3,101*    Estimated Creatinine Clearance: 21.7 mL/min (A) (by C-G formula based on SCr of 2.18 mg/dL (H)).   Medical History: Past Medical History:  Diagnosis Date   Anorexia    Asthma with COPD ([place])    Bladder cancer ([place])    and prostatic urethra cancer  HX TURBT'S IN [place], [place]  WITH INSTILLATION CHEMO TX'S   Bladder tumor    History of transfusion    HIV disease ([place])    under care of [facility]   Hypertension    Nocturia    Vitamin D deficiency     Medications:  Medications Prior to Admission  Medication Sig Dispense Refill Last Dose   albuterol (VENTOLIN HFA) 108 (90 Base) MCG/ACT inhaler INHALE 2 PUFFS BY MOUTH INTO THE LUNGS EVERY 6 HOURS AS NEEDED FOR WHEEZING OR SHORTNESS OF BREATH 6.7 g 1    amLODipine (NORVASC) 5 MG tablet Take 1 tablet (5 mg total) by mouth daily. 30 tablet 1    atenolol (TENORMIN) 100 MG tablet Take 1 tablet (100 mg total) by mouth daily. 30 tablet 2    ciprofloxacin (CIPRO) 500 MG tablet Take 1 tablet by mouth twice daily 14 tablet 0    Darunavir-Cobicistat-Emtricitabine-Tenofovir Alafenamide (SYMTUZA) 800-150-200-10 MG TABS Take 1 tablet by mouth daily with breakfast. 30  tablet 0    Ensure (ENSURE) Take 1 Can by mouth 2 (two) times daily between meals. 237 mL 12    ipratropium-albuterol (DUONEB) 0.5-2.5 (3) MG/3ML SOLN Inhale 3 mLs (1 vial) by nebulization every 6 (six) hours as needed (wheezing and chest tightness). 360 mL 1    phenazopyridine (PYRIDIUM) 200 MG tablet Take 1 tablet by mouth every 8 hours as needed painful urination 20 tablet 0     Assessment: 40 yom admitted with AMS and AKI. Pharmacy consulted for IV heparin for ACS/chest pain. PMH: COPD, HIV, HTN, bladder tumor. He had enoxaparin 30 mg at 09:53. Troponin 3101. AKI improving, SCr down to 2.18. No bleeding noted, Hgb down 9.6 (likely dilutional), platelets are down to 151.  Goal of Therapy:  Heparin level 0.3-0.7 units/ml Monitor platelets by anticoagulation protocol: Yes   Plan:  Begin IV heparin at 600 units/hr with no bolus due to recent enoxaparin dose 8 hr heparin level Daily heparin level and CBC Monitor for signs/symptoms of bleeding  Thank you for involving pharmacy in this patient's care.  Renold Genta, PharmD, BCPS Clinical Pharmacist Clinical phone for 10/26/2022 is [phone] 10/26/2022 11:11 AM

## 2022-10-26 NOTE — Plan of Care (Signed)
  Problem: Education: Goal: Knowledge of disease and its progression will improve Outcome: Not Progressing   Problem: Health Behavior/Discharge Planning: Goal: Ability to manage health-related needs will improve Outcome: Not Progressing   Problem: Activity: Goal: Activity intolerance will improve Outcome: Not Progressing   Problem: Nutritional: Goal: Ability to make appropriate dietary choices will improve Outcome: Not Progressing   Problem: Respiratory: Goal: Respiratory symptoms related to disease process will be avoided Outcome: Progressing   Problem: Self-Concept: Goal: Body image disturbance will be avoided or minimized Outcome: Progressing

## 2022-10-26 NOTE — Progress Notes (Signed)
Initial Nutrition Assessment RD working remotely.   DOCUMENTATION CODES:   Underweight  INTERVENTION:  - ordered Boost Breeze TID, each supplement provides 250 kcal and 9 grams of protein  - ordered 30 ml Prosource Plus TID, each supplement provides 100 kcal and 15 grams protein.   - ordered Simply Thick PRN.  - ordered 1 tablet multivitamin with minerals/day.  - diet advancement as medically feasible.  - complete NFPE when feasible.    NUTRITION DIAGNOSIS:   Increased nutrient needs related to acute illness, chronic illness as evidenced by estimated needs.  GOAL:   Patient will meet greater than or equal to 90% of their needs  MONITOR:   PO intake, Supplement acceptance, Diet advancement, Labs, Weight trends  REASON FOR ASSESSMENT:   Consult Assessment of nutrition requirement/status  ASSESSMENT:   67 year old male with medical history of COPD, HIV, HTN, bladder tumor, alcohol abuse, vitamin D deficiency. He presented to the ED via EMS due to AMS after a neighbor called as he was not acting like himself, smelled of urine, had urine bottles everywhere, and he has had urinary retention x 5 days.  In the ED he was a limited historian and reported cough, shortness of breath, and some lower abdominal (near his penis) pain. He reported his last drink was several days PTA. Patient was noted to be severely dehydrated. Patient admitted for severe dehydration, FTT, and AKI. in the ED he was diven 2 L of IV fluids.  Patient noted to be a/o to person and place with poor attention / concentration. He has been following commands.   Renal diet with liquids and 1.2 L fluid restriction ordered 3/22 at 2306 and down graded to Dysphagia 2, nectar-thick with 1.2 L fluid restriction on 3/23 at 1226 then to CLD, nectar-thick with 1.2 L fluid restriction on 3/23 at 1535. Documented meal completion percentages in the flow sheet are 0% of lunch and 5% of dinner yesterday and 0% of breakfast this  morning.   He has not been assessed by a [place] RD at any time in the past.   Weight today is 97 lb and weight yesterday was 98.5 lb. PTA the most recently documented weight was 120 lb on 04/23/22. This indicates 23 lb / 19% wt loss in 6 months; significant for time frame.  Palliative Care met with patient yesterday and patient is DNR (does not want CPR, ok with time-limited trial on vent). Patient's sister was present for the conversation with Palliative Care. PPS of 40-50%.   SLP evaluated patient yesterday and completed MBS; recommended current diet order.   Per notes: - severe AKI - dehydration on admission - metabolic acidosis d/t AKI - alcohol abuse - hx of HIV   Labs reviewed; Cl: 121 mmol/l, BUN: 63 mg/dl, creatinine: 2.18 mg/dl, Ca: 7.9 mg/dl, GFR: 32 ml/min.  Medications reviewed; 1 mg folvite/day, 10 mEq IV KCl x2 runs 3/24, 100 mg oral thiamine/day.  IVF; D5-NS @ 50 ml/hr (204 kcal/24 hrs)    NUTRITION - FOCUSED PHYSICAL EXAM:  RD working remotely.  Diet Order:   Diet Order             Diet clear liquid Room service appropriate? Yes; Fluid consistency: Nectar Thick; Fluid restriction: 1200 mL Fluid  Diet effective now                   EDUCATION NEEDS:   Not appropriate for education at this time  Skin:  Skin Assessment: Skin Integrity Issues:  Skin Integrity Issues:: Stage II Stage II: L buttocks  Last BM:  PTA/unknown  Height:   Ht Readings from Last 1 Encounters:  10/24/22 5\' 4"  (1.626 m)    Weight:   Wt Readings from Last 1 Encounters:  10/26/22 43.8 kg    BMI:  Body mass index is 16.58 kg/m.  Estimated Nutritional Needs:  Kcal:  1750-2000 kcal Protein:  80-95 grams Fluid:  >/= 2 L/day     Jarome Matin, MS, RD, LDN, CNSC Clinical Dietitian PRN/Relief staff On-call/weekend pager # available in [facility]

## 2022-10-26 NOTE — Progress Notes (Signed)
  Echocardiogram 2D Echocardiogram has been performed.  [name] 10/26/2022, 12:57 PM

## 2022-10-26 NOTE — Progress Notes (Signed)
Patient has poor oral intake. Family has been at bedside attempting to encourage fluids with little success.

## 2022-10-27 DIAGNOSIS — N179 Acute kidney failure, unspecified: Secondary | ICD-10-CM | POA: Diagnosis not present

## 2022-10-27 LAB — CBC
HCT: 31.3 % — ABNORMAL LOW (ref 39.0–52.0)
Hemoglobin: 9.9 g/dL — ABNORMAL LOW (ref 13.0–17.0)
MCH: 33.3 pg (ref 26.0–34.0)
MCHC: 31.6 g/dL (ref 30.0–36.0)
MCV: 105.4 fL — ABNORMAL HIGH (ref 80.0–100.0)
Platelets: 135 10*3/uL — ABNORMAL LOW (ref 150–400)
RBC: 2.97 MIL/uL — ABNORMAL LOW (ref 4.22–5.81)
RDW: 14.2 % (ref 11.5–15.5)
WBC: 6.8 10*3/uL (ref 4.0–10.5)
nRBC: 0.3 % — ABNORMAL HIGH (ref 0.0–0.2)

## 2022-10-27 LAB — BASIC METABOLIC PANEL
Anion gap: 4 — ABNORMAL LOW (ref 5–15)
BUN: 35 mg/dL — ABNORMAL HIGH (ref 8–23)
CO2: 18 mmol/L — ABNORMAL LOW (ref 22–32)
Calcium: 7.8 mg/dL — ABNORMAL LOW (ref 8.9–10.3)
Chloride: 123 mmol/L — ABNORMAL HIGH (ref 98–111)
Creatinine, Ser: 1.59 mg/dL — ABNORMAL HIGH (ref 0.61–1.24)
GFR, Estimated: 47 mL/min — ABNORMAL LOW (ref >60)
Glucose, Bld: 124 mg/dL — ABNORMAL HIGH (ref 70–99)
Potassium: 3.7 mmol/L (ref 3.5–5.1)
Sodium: 145 mmol/L (ref 135–145)

## 2022-10-27 LAB — HEPARIN LEVEL (UNFRACTIONATED)
Heparin Unfractionated: 0.3 IU/mL (ref 0.30–0.70)
Heparin Unfractionated: 0.22 IU/mL — ABNORMAL LOW (ref 0.30–0.70)
Heparin Unfractionated: 0.37 IU/mL (ref 0.30–0.70)

## 2022-10-27 LAB — BRAIN NATRIURETIC PEPTIDE: B Natriuretic Peptide: 853.6 pg/mL — ABNORMAL HIGH (ref 0.0–100.0)

## 2022-10-27 LAB — MAGNESIUM: Magnesium: 2.3 mg/dL (ref 1.7–2.4)

## 2022-10-27 MED ORDER — HEPARIN BOLUS VIA INFUSION
500.0000 [IU] | Freq: Once | INTRAVENOUS | Status: AC
  Administered 2022-10-27: 500 [IU] via INTRAVENOUS
  Filled 2022-10-27: qty 500

## 2022-10-27 NOTE — Progress Notes (Signed)
PROGRESS NOTE [name]  [id number] DOB: April 06, 1956 DOA: 10/21/2022 PCP: Fenton Foy, NP  Brief Narrative/Hospital Course: 67 year old male with reported COPD, HIV, hypertension, bladder tumor alcohol use brought by EMS for altered mental status after a neighbor called for him as he was not acting like himself, smell of urine and a urine bottles everywhere and he has had urinary retention x 5 days. In the ED patient is alert oriented to self place year, limited historian, complaining of a cough and shortness of breath recently and some pain in lower abdomen near his penis, admitted he does drink alcohol and last drink was several days ago.  Vitals w/ HR 135, afebrile, on RA saturating well,  found to have severe dehydration, failure to thrive, AKI w. Creat 5.7, hco3 14, ast 63, bladder scan negative, chest x-ray and CT head no acute finding.  He was given 2 L bolus UA and ultrasound obtained and admitted for further management.   Subjective: Patient in bed denies any headache or chest pain, still has some cough and difficulty swallowing, mild shortness of breath, no abdominal pain or focal weakness.  Assessment and Plan:   Severe AKI Dehydration Metabolic acidosis due to AKI: Bladder scan negative, renal ultrasound chronic medical renal disease without hydronephrosis.Creat improving with IV fluid > cut to 50 cc/hr bnp elevated this am cxr clear tho,hydration, monitor electrolytes, urine output. AKI is most likely prerenal.Avoid nephrotoxic medication,dose meds renally.  Recent Labs    12/31/21 1647 04/23/22 1534 10/11/2022 2016 10/25/22 0238 10/26/22 0144 10/27/22 0647  BUN 14 12 102* 100* 63* 35*  CREATININE 1.06 1.01 5.78* 4.67* 2.18* 1.59*   NSTEMI Elevated troponin with ST and T wave changes in anterior leads-NSTEMI Tachycardia w/ narrow complex tachycardia/NSVT: By cardiology underwent echocardiogram showing low normal EF with moderate to severe AS, per cardiology he is  currently on amiodarone drip, and heparin drip, not a candidate for invasive testing or procedures.  Currently chest pain-free.  Defer management to cardiology.  Alcohol abuse: Level less than 10, no withdrawals,watch for withdrawal symptoms and address accordingly, patient last drink was several days ago. Cont thiamine folate vitamins-monitor  ?Seizure disorder per his brother- not on AEDS. Monitor  Poor historian/altered mental status with confusion likely acute metabolic encephalopathy: Continue supportive care gentle hydration  HIV disease followed by ID last CD4 count stable /viral load elevated as below.Continue home [place].  Per [name] his brother- patient's rest of the family-sisters  not aware of this diagnosis and only [name] is aware who has been helping him with his care.   Lab Results  Component Value Date   CD4TABS 405 02/18/2022   CD4TABS 553 12/31/2021   CD4TABS 443 04/02/2021   Lab Results  Component Value Date   HIV1RNAQUANT 109,000 (H) 02/18/2022     Asthma:not wheezing, not in exacerbation. Depression: Mood is stable Hypertension BP controlled on home amlodipine> will discontinue for now as placed him on metoprolol Anemia likely from chronic disease HIV, initial hemoglobin high at 13 g likely hemoconcentrated.  His hemoglobin was in low 10 g 6 months ago- monitor hb Recent Labs  Lab 10/25/2022 2016 10/05/2022 2021 10/25/22 0238 10/26/22 0144 10/27/22 0647  HGB 13.2 13.3 10.4* 9.6* 9.9*  HCT 38.9* 39.0 30.4* 28.5* 31.3*    History of bladder cancer-reportedly patient was having hematuria on last follow-up with ID, question of recurrent bladder cancer, will need to monitor closely while on heparin.  Severe malnutrition BMI 16.9,dietitian consulted-augment aggressively status.Speech eval for swallowing.  Spitting during eating and taking meds:speech eval requested- cont on modified diet.  Goals of care: Had detailed discussion with patient's brother who is the  primary decision maker bedside on 10/27/2022, he understands that his brother may pass away this hospitalization, he wants to continue gentle medical treatment, if any further decline he confirms that he will be pursuing full comfort measures at that time.  DNR.  Pressure ulcer STAGE 2  left buttock POA see below Pressure Injury 10/25/22 Buttocks Left Stage 2 -  Partial thickness loss of dermis presenting as a shallow open injury with a red, pink wound bed without slough. (Active)  10/25/22 0100  Location: Buttocks  Location Orientation: Left  Staging: Stage 2 -  Partial thickness loss of dermis presenting as a shallow open injury with a red, pink wound bed without slough.  Wound Description (Comments):   Present on Admission: Yes  Dressing Type Foam - Lift dressing to assess site every shift 10/25/22 0111   DVT prophylaxis:  Code Status:   Code Status: DNR Family Communication: Mike bedside on 10/27/2022, DNR, gentle medical treatment, if declines full comfort care. Patient status is: Inpatient because of AKI Level of care: MedSurg bed  Dispo: The patient is from: Home            Anticipated disposition: TBD Objective: Vitals last 24 hrs: Vitals:   10/26/22 2354 10/27/22 0451 10/27/22 0500 10/27/22 0800  BP: (!) 101/59 (!) 113/56  124/72  Pulse: 85 83  97  Resp: 20 17  20   Temp: 98.2 F (36.8 C) 98 F (36.7 C)  98.6 F (37 C)  TempSrc: Oral Oral  Oral  SpO2: 98% 98%    Weight:   48.6 kg   Height:       Weight change: -3.91 kg  Physical Examination:  Frail and cachectic middle-aged African-American male lying in hospital bed in no distress, [email or web address],PERRAL Supple Neck, No JVD,   Symmetrical Chest wall movement, Good air movement bilaterally, bilateral coarse breath sounds RRR,No Gallops, Rubs or new Murmurs,  +ve B.Sounds, Abd Soft, No tenderness,   No Cyanosis, Clubbing or edema    Medications reviewed:  Scheduled Meds:  (feeding supplement) PROSource Plus  30 mL Oral  TID BM   Darunavir-Cobicistat-Emtricitabine-Tenofovir Alafenamide  1 tablet Oral Q breakfast   feeding supplement  1 Container Oral TID WC   folic acid  1 mg Oral Daily   metoprolol tartrate  25 mg Oral BID   multivitamin with minerals  1 tablet Oral Daily   rosuvastatin  20 mg Oral Daily   thiamine  100 mg Oral Daily   Continuous Infusions:  amiodarone 30 mg/hr (10/27/22 0407)   dextrose 5 % and 0.9% NaCl 50 mL/hr at 10/27/22 0408   heparin 650 Units/hr (10/27/22 0810)   Diet Order             Diet clear liquid Room service appropriate? Yes; Fluid consistency: Nectar Thick; Fluid restriction: 1200 mL Fluid  Diet effective now                  Intake/Output Summary (Last 24 hours) at 10/27/2022 0855 Last data filed at 10/26/2022 2229 Gross per 24 hour  Intake 146.33 ml  Output 425 ml  Net -278.67 ml   Net IO Since Admission: 492.22 mL [10/27/22 0855]  Wt Readings from Last 3 Encounters:  10/27/22 48.6 kg  04/23/22 54.4 kg  02/18/22 54 kg    Data Reviewed: I have personally reviewed  following labs and imaging studies  Recent Labs  Lab 10/21/2022 2016 10/08/2022 2021 10/25/22 0238 10/26/22 0144 10/27/22 0647  WBC 8.7  --  8.1 4.5 6.8  HGB 13.2 13.3 10.4* 9.6* 9.9*  HCT 38.9* 39.0 30.4* 28.5* 31.3*  PLT 234  --  186 151 135*  MCV 102.1*  --  100.3* 101.1* 105.4*  MCH 34.6*  --  34.3* 34.0 33.3  MCHC 33.9  --  34.2 33.7 31.6  RDW 13.9  --  13.8 13.6 14.2  LYMPHSABS 1.0  --   --   --   --   MONOABS 0.6  --   --   --   --   EOSABS 0.0  --   --   --   --   BASOSABS 0.0  --   --   --   --     Recent Labs  Lab 10/10/2022 2016 10/29/2022 2021 10/25/22 0238 10/26/22 0144 10/26/22 0912 10/26/22 0913 10/27/22 0647  NA 134* 139 136 143  --   --  145  K 4.8 5.1 5.0 3.7  --   --  3.7  CL 102  --  108 121*  --   --  123*  CO2 14*  --  17* 17*  --   --  18*  ANIONGAP 18*  --  11 5  --   --  4*  GLUCOSE 130*  --  112* 143*  --   --  124*  BUN 102*  --  100* 63*  --   --   35*  CREATININE 5.78*  --  4.67* 2.18*  --   --  1.59*  AST 63*  --   --   --   --   --   --   ALT 30  --   --   --   --   --   --   ALKPHOS 80  --   --   --   --   --   --   BILITOT 1.0  --   --   --   --   --   --   ALBUMIN 2.7*  --  2.0*  --   --   --   --   TSH  --   --   --   --  1.186  --   --   BNP  --   --   --   --   --  1,293.6* 853.6*  MG  --   --   --   --   --   --  2.3  CALCIUM 9.4  --  8.5* 7.9*  --   --  7.8*      Recent Labs  Lab 10/03/2022 2016 10/25/22 0238 10/26/22 0144 10/26/22 0912 10/26/22 0913 10/27/22 0647  TSH  --   --   --  1.186  --   --   BNP  --   --   --   --  1,293.6* 853.6*  MG  --   --   --   --   --  2.3  CALCIUM 9.4 8.5* 7.9*  --   --  7.8*      Antimicrobials: Anti-infectives (From admission, onward)    Start     Dose/Rate Route Frequency Ordered Stop   10/25/22 0845  Darunavir-Cobicistat-Emtricitabine-Tenofovir Alafenamide (SYMTUZA) 800-150-200-10 MG TABS 1 tablet        1 tablet Oral Daily with breakfast 10/10/2022  2310        Culture/Microbiology    Component Value Date/Time   SDES BLOOD RIGHT FOREARM 09/10/2016 0319   SPECREQUEST BOTTLES DRAWN AEROBIC AND ANAEROBIC 10CC EA 09/10/2016 0319   CULT NO GROWTH 5 DAYS 09/10/2016 0319   REPTSTATUS 09/15/2016 FINAL 09/10/2016 0319   Other culture-see note  Radiology Studies: ECHOCARDIOGRAM COMPLETE  Result Date: 10/26/2022    ECHOCARDIOGRAM REPORT   Patient Name:   LAITH EBERSOLD Date of Exam: 10/26/2022 Medical Rec #:  [id number]  Height:       64.0 in Accession #:    [id number] Weight:       105.8 lb Date of Birth:  [birth year]   BSA:          1.493 m Patient Age:    13 years   BP:           125/74 mmHg Patient Gender: M          HR:           83 bpm. Exam Location:  Inpatient Procedure: 2D Echo, Cardiac Doppler and Color Doppler STAT ECHO Indications:     ventricular tachycardia  History:         Patient has no prior history of Echocardiogram examinations.                  HIV; Risk  Factors:Hypertension and Current Smoker.  Sonographer:     Johny Chess RDCS Referring Phys:  [id number] [place] Diagnosing Phys: Vernell Leep MD  Sonographer Comments: No parasternal window and Technically difficult study due to poor echo windows. Image acquisition challenging due to respiratory motion. IMPRESSIONS  1. Left ventricular ejection fraction, by estimation, is 50 to 55%. The left ventricle has low normal function. The left ventricle has no regional wall motion abnormalities. Left ventricular diastolic parameters are consistent with Grade I diastolic dysfunction (impaired relaxation).  2. Right ventricular systolic function is normal. The right ventricular size is normal.  3. The mitral valve is normal in structure. No evidence of mitral valve regurgitation. No evidence of mitral stenosis.  4. In the limited visualization of the aortic valve, it is not possible to rule out vegetation. . The aortic valve was not well visualized. Aortic valve regurgitation is moderate to severe. FINDINGS  Left Ventricle: Left ventricular ejection fraction, by estimation, is 50 to 55%. The left ventricle has low normal function. The left ventricle has no regional wall motion abnormalities. The left ventricular internal cavity size was normal in size. There is no left ventricular hypertrophy. Left ventricular diastolic parameters are consistent with Grade I diastolic dysfunction (impaired relaxation). Normal left ventricular filling pressure. Right Ventricle: The right ventricular size is normal. No increase in right ventricular wall thickness. Right ventricular systolic function is normal. Left Atrium: Left atrial size was normal in size. Right Atrium: Right atrial size was normal in size. Pericardium: There is no evidence of pericardial effusion. Mitral Valve: The mitral valve is normal in structure. No evidence of mitral valve regurgitation. No evidence of mitral valve stenosis. Tricuspid Valve: The tricuspid  valve is normal in structure. Tricuspid valve regurgitation is not demonstrated. No evidence of tricuspid stenosis. Aortic Valve: In the limited visualization of the aortic valve, it is not possible to rule out vegetation. The aortic valve was not well visualized. Aortic valve regurgitation is moderate to severe. Aortic regurgitation PHT measures 248 msec. Pulmonic Valve: The pulmonic valve was normal in structure. Pulmonic valve regurgitation is not visualized. No evidence  of pulmonic stenosis. Aorta: The aortic root is normal in size and structure. IAS/Shunts: The interatrial septum was not assessed.  LEFT VENTRICLE PLAX 2D LVIDd:         5.00 cm     Diastology LVIDs:         2.80 cm     LV e' medial:    6.64 cm/s LV PW:         0.90 cm     LV E/e' medial:  7.0 LV IVS:        1.00 cm     LV e' lateral:   7.18 cm/s LVOT diam:     1.80 cm     LV E/e' lateral: 6.5 LV SV:         44 LV SV Index:   30 LVOT Area:     2.54 cm  LV Volumes (MOD) LV vol d, MOD A4C: 92.7 ml LV vol s, MOD A4C: 46.4 ml LV SV MOD A4C:     92.7 ml RIGHT VENTRICLE            IVC RV S prime:     7.29 cm/s  IVC diam: 1.50 cm LEFT ATRIUM           Index        RIGHT ATRIUM           Index LA diam:      2.60 cm 1.74 cm/m   RA Area:     10.10 cm LA Vol (A4C): 21.8 ml 14.60 ml/m  RA Volume:   21.10 ml  14.14 ml/m  AORTIC VALVE LVOT Vmax:   104.50 cm/s LVOT Vmean:  69.750 cm/s LVOT VTI:    0.174 m AI PHT:      248 msec  AORTA Ao Root diam: 2.80 cm MITRAL VALVE MV Area (PHT): 3.85 cm    SHUNTS MV Decel Time: 197 msec    Systemic VTI:  0.17 m MV E velocity: 46.40 cm/s  Systemic Diam: 1.80 cm MV A velocity: 52.10 cm/s MV E/A ratio:  0.89 Manish Patwardhan MD Electronically signed by Vernell Leep MD Signature Date/Time: 10/26/2022/3:07:11 PM    Final    DG Chest Port 1 View  Result Date: 10/26/2022 CLINICAL DATA:  Shortness of breath. EXAM: PORTABLE CHEST 1 VIEW COMPARISON:  October 24, 2022. FINDINGS: The heart size and mediastinal contours are  within normal limits. Both lungs are clear. The visualized skeletal structures are unremarkable. IMPRESSION: No active disease. Electronically Signed   By: Marijo Conception M.D.   On: 10/26/2022 10:16   DG Swallowing Func-Speech Pathology  Result Date: 10/25/2022 Table formatting from the original result was not included. Modified Barium Swallow Study Patient Details Name: Jarman Berdugo MRN: [id number] Date of Birth: 11-Sep-1955 Today's Date: 10/25/2022 HPI/PMH: HPI: Pt is a 67 y.o. 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 Pt is generally alert and oriented, presenting generally lethargic with reduced intelligibility. Pt reports difficulty when swallowing liquids and solids. Pt is currently on a renal diet is a concern for malnutrition and reduced hydration., with a fluid restriction of 1200 mL. Chest CXR on 10/14/2022 revealed Emphysematous changes in the lungs and  no evidence of active pulmonary disease. Clinical Impression: Clinical Impression: Patient presents with a mild to moderate oral phase dysphagia and a mod-severe pharyngeal phase dysphagia. Both oral and pharyngeal phase of swallow appear significantly impacted by oral motor and pharyngeal musculature weakness. In addition, appearance of suspected edema of posterior pharynx and arytenoids resulted in decreased epiglottic inversion and decreased PES opening. With thin liquids, patient exhibited delayed oral and pharyngeal phase, followed by aspiration after the initial swallow from residuals left in pharynx after incomplete bolus transit through PES. Patient exhibited similar delays in oral and pharyngeal phase of swallow,  slightly increased amount of pharyngeal residuals (diffuse) but no penetration or aspiration prior to, during or after swallow. Patient able to swallow bite of puree solid, however this resulted in greater amount of pharyngeal residuals, with patient eventually coughing and expectorating majority of puree bolus. Although aspiration was silent, patient did have effective cough to clear penetrate even when at level or just below vocal cords. SLP suspects that pharyngeal secretions are impacting patient's swallow function and could also be the result of pharyngeal phase dysphagia. Patient is at a high risk of aspiration, malnutrition and dehydration. SLP recommending clear liquids/nectar thick at this time and will continue to follow patient for PO recommendations and ability to tolerate. Factors that may increase risk of adverse event in presence of aspiration ([place] 2021): Factors that may increase risk of adverse event in presence of aspiration ([place] 2021): Frail or deconditioned; Inadequate oral hygiene; Poor general health and/or compromised immunity Recommendations/Plan: Swallowing Evaluation Recommendations Swallowing Evaluation Recommendations Recommendations: PO diet PO Diet Recommendation: Clear liquid diet; Mildly thick liquids (Level 2, nectar thick) Liquid Administration via: Cup; Straw Medication Administration: Crushed with puree Supervision: Intermittent supervision/cueing for swallowing strategies; Set-up assistance for safety Swallowing strategies  : Slow rate; Small bites/sips; Clear throat intermittently Postural changes: Position pt fully upright for meals; Stay upright 30-60 min after meals Oral care recommendations: Oral care BID (2x/day); Staff/trained caregiver to provide oral care Treatment Plan Treatment Plan Treatment recommendations: Therapy as outlined in treatment plan below Follow-up recommendations: Follow physicians's recommendations for discharge plan and follow  up therapies Functional status assessment: Patient has had a recent decline in their functional status and demonstrates the ability to make significant improvements in function in a reasonable and predictable amount of time. Treatment frequency: Min 2x/week Treatment duration: 2 weeks Interventions: Compensatory techniques; Patient/family education; Trials of upgraded texture/liquids; Diet toleration management by SLP Recommendations Recommendations for follow up therapy are one component of a multi-disciplinary discharge planning process, led by the attending physician.  Recommendations may be updated based on patient status, additional functional criteria and insurance authorization. Assessment: Orofacial Exam: Orofacial Exam Oral Cavity: Oral Hygiene: Pooled secretions Oral Cavity - Dentition: Edentulous Orofacial Anatomy: WFL Oral Motor/Sensory Function: WFL Anatomy: Anatomy: WFL Thin Liquids: Thin Liquids (Level 0) Thin Liquids : Impaired Bolus delivery method: Spoon; Cup Thin Liquid - Impairment: Pharyngeal impairment; Oral Impairment Lip Closure: Interlabial escape, no progression to anterior lip Tongue control during bolus hold: Not tested Bolus transport/lingual motion: Slow tongue motion Oral residue: Trace residue lining oral structures Location of oral residue : Tongue Initiation of swallow : Pyriform sinuses Soft palate elevation: Trace column of contrast or air between SP and PW Laryngeal elevation: Partial superior movement of thyroid cartilage/partial approximation of arytenoids to epiglottic petiole Anterior hyoid excursion: Partial Epiglottic movement: Partial Laryngeal vestibule closure: Incomplete, narrow column air/contrast in laryngeal vestibule Pharyngeal stripping wave : Present -  complete Pharyngeal contraction (A/P view only): N/A Pharyngoesophageal segment opening: Minimal distention/minimal duration, marked obstruction of flow Tongue base retraction: Narrow column of contrast or air  between tongue base and PPW Pharyngeal residue: Majority of contrast within or on pharyngeal structures Location of pharyngeal residue: Valleculae; Aryepiglottic folds; Pyriform sinuses Penetration/Aspiration Scale (PAS) score: 8.  Material enters airway, passes BELOW cords without attempt by patient to eject out (silent aspiration)  Mildly Thick Liquids: Mildly thick liquids (Level 2, nectar thick) Mildly thick liquids (Level 2, nectar thick): Impaired Bolus delivery method: Spoon; Cup Mildly Thick Liquid - Impairment: Oral Impairment; Pharyngeal impairment Initiation of swallow : Valleculae Soft palate elevation: No bolus between soft palate (SP)/pharyngeal wall (PW) Laryngeal elevation: Partial superior movement of thyroid cartilage/partial approximation of arytenoids to epiglottic petiole Anterior hyoid excursion: Partial Epiglottic movement: Partial Laryngeal vestibule closure: Incomplete, narrow column air/contrast in laryngeal vestibule Pharyngeal stripping wave : Present - complete Pharyngeal contraction (A/P view only): N/A Pharyngoesophageal segment opening: Minimal distention/minimal duration, marked obstruction of flow Tongue base retraction: Trace column of contrast or air between tongue base and PPW Location of pharyngeal residue: Valleculae; Aryepiglottic folds; Pyriform sinuses Penetration/Aspiration Scale (PAS) score: 1.  Material does not enter airway  Moderately Thick Liquids: Moderately thick liquids (Level 3, honey thick) Moderately thick liquids (Level 3, honey thick): Not Tested  Puree: Puree Puree: Impaired Puree - Impairment: Oral Impairment; Pharyngeal impairment Initiation of swallow: Valleculae Soft palate elevation: No bolus between soft palate (SP)/pharyngeal wall (PW) Laryngeal elevation: Partial superior movement of thyroid cartilage/partial approximation of arytenoids to epiglottic petiole Anterior hyoid excursion: Partial Epiglottic movement: Partial Laryngeal vestibule closure:  Complete, no air/contrast in laryngeal vestibule Pharyngeal stripping wave : Present - complete Pharyngoesophageal segment opening: Minimal distention/minimal duration, marked obstruction of flow Tongue base retraction: Trace column of contrast or air between tongue base and PPW Pharyngeal residue: Majority of contrast within or on pharyngeal structures Location of pharyngeal residue: Valleculae; Aryepiglottic folds; Pyriform sinuses; Pharyngeal wall Penetration/Aspiration Scale (PAS) score: 1.  Material does not enter airway Solid: Solid Solid: Not Tested Pill: Pill Pill: Not Tested Compensatory Strategies: No data recorded  General Information: Caregiver present: No  Diet Prior to this Study: Dysphagia 2 (finely chopped); Moderately thick liquids (Level 3, honey thick)   Temperature : Normal   Respiratory Status: WFL   Supplemental O2: None (Room air)   History of Recent Intubation: No  Behavior/Cognition: Alert; Cooperative Self-Feeding Abilities: Needs assist with self-feeding Baseline vocal quality/speech: Normal Volitional Cough: Able to elicit Volitional Swallow: Able to elicit Exam Limitations: No limitations Goal Planning: Prognosis for improved oropharyngeal function: Fair Barriers to Reach Goals: Severity of deficits; Time post onset No data recorded No data recorded Consulted and agree with results and recommendations: Patient Pain: Pain Assessment Pain Assessment: No/denies pain End of Session: Start Time:SLP Start Time (ACUTE ONLY): 1500 Stop Time: SLP Stop Time (ACUTE ONLY): 1525 Time Calculation:SLP Time Calculation (min) (ACUTE ONLY): 25 min Charges: SLP Evaluations $ SLP Speech Visit: 1 Visit SLP Evaluations $BSS Swallow: 1 Procedure $MBS Swallow: 1 Procedure SLP visit diagnosis: SLP Visit Diagnosis: Dysphagia, oropharyngeal phase (R13.12) Past Medical History: Past Medical History: Diagnosis Date  Anorexia   Asthma with COPD ([place])   Bladder cancer ([place])   and prostatic urethra cancer  HX TURBT'S  IN [place], [place]  WITH INSTILLATION CHEMO TX'S  Bladder tumor   History of transfusion   HIV disease ([place])   under care of [facility]  Hypertension  Nocturia   Vitamin D deficiency  Past Surgical History: Past Surgical History: Procedure Laterality Date  CATARACT EXTRACTION W/ INTRAOCULAR LENS  IMPLANT, BILATERAL  2014  FULGURATION OF BLADDER TUMOR Bilateral 05/31/2014  Procedure: BLADDER BIOPSY WITH FULGERATION BILATERAL RETROGRADE [id number];  Surgeon: Ardis Hughs, MD;  Location: [facility];  Service: Urology;  Laterality: Bilateral;  JOINT REPLACEMENT    LUMBAR DISC SURGERY  x2  last one 2008  TOTAL HIP ARTHROPLASTY Bilateral x each with revision's  last one for Right 2012/  last one for Left 2010  TRANSURETHRAL RESECTION OF BLADDER TUMOR  X3   last one 02/ 2015 ([place], [place])  TRANSURETHRAL RESECTION OF BLADDER TUMOR WITH GYRUS (TURBT-GYRUS) Bilateral 05/16/2015  Procedure: TRANSURETHRAL RESECTION OF BLADDER TUMOR WITH GYRUS (TURBT-GYRUS), bilateral retrograde pyelogram;  Surgeon: Ardis Hughs, MD;  Location: [facility];  Service: Urology;  Laterality: Bilateral;  TRANSURETHRAL RESECTION OF BLADDER TUMOR WITH GYRUS (TURBT-GYRUS) N/A 07/05/2015  Procedure: RE RESECTION OF BLADDER TUMOR (TURBT) RIGHT BLADDER NECK;  Surgeon: Ardis Hughs, MD;  Location: [facility];  Service: Urology;  Laterality: N/A; Sonia Baller, MA, CCC-SLP Speech Therapy     LOS: 3 days   Lala Lund, MD [facility]  10/27/2022, 8:55 AM

## 2022-10-27 NOTE — Progress Notes (Signed)
Speech Language Pathology Treatment: Dysphagia  Patient Details Name: [name] MRN: [id number] DOB: 08-23-1955 Today's Date: 10/27/2022 Time: 1032-1100 SLP Time Calculation (min) (ACUTE ONLY): 28 min  Assessment / Plan / Recommendation Clinical Impression  Pt was seen for f/u after MBS, with brother at bedside for education. His brother was very aware of diet recommendations, making sure that SLP offered thickened liquids and sharing that he has been offering liquids every 30 minutes or so to try to encourage intake. SLP suctioned thick secretions from oral cavity prior to offering POs, with wet vocal quality also noted. Pt had difficulty coughing to command - sometimes not coughing at all, and other times coughing but appearing very weak. Pt did consume sips of nectar thick liquids, needing encouragement to keep going. Occasional throat clearing was noted but this is also a strategy that has been recommended for him to use. Recommend that he remain on current diet for now. Note that in addition to concerns for aspiration per MBS report, there is also concern for inadequate nutrition and hydration.    HPI HPI: Pt is a 67 y.o. 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 Pt is generally alert and oriented, presenting generally lethargic with reduced intelligibility. Pt reports difficulty when swallowing liquids and solids. Pt is currently on a renal diet is a concern for malnutrition and reduced hydration., with a fluid restriction of 1200 mL. Chest CXR on 10/24/22 revealed Emphysematous changes in the lungs and no evidence of active pulmonary disease.      SLP Plan   Continue with current plan of care      Recommendations for follow up therapy are one component of a multi-disciplinary discharge planning process, led by the attending physician.  Recommendations may be updated based on patient status, additional functional criteria and insurance authorization.    Recommendations  Diet recommendations: Nectar-thick liquid Liquids provided via: Cup;Straw Medication Administration: Crushed with puree Supervision: Staff to assist with self feeding Compensations: Slow rate;Small sips/bites;Minimize environmental distractions;Clear throat intermittently Postural Changes and/or Swallow Maneuvers: Seated upright 90 degrees;Upright 30-60 min after meal                  Oral care BID   Frequent or constant Supervision/Assistance Dysphagia, oropharyngeal phase (R13.12)     Continue with current plan of care     Osie Bond., M.A. [place] Office [phone]  Secure chat preferred   10/27/2022, 3:36 PM

## 2022-10-27 NOTE — TOC Initial Note (Signed)
Transition of Care [facility]) - Initial/Assessment Note    Patient Details  Name: [name] MRN: [id number] Date of Birth: 01-Jul-1956  Transition of Care [facility]) CM/SW Contact:    Benard Halsted, LCSW Phone Number: 10/27/2022, 4:29 PM  Clinical Narrative:                 CSW received consult for possible SNF placement at time of discharge. CSW spoke with patient's brother to introduce SNF. He requested to speak with CSW in person tomorrow.   Skilled Nursing Rehab Facilities-   [email or web address]   Ratings out of 5 stars (5 the highest)   Name Address  Phone # [place] Inspection Overall  [facility] [address], [place] 4 5 2 3   [facility]  [address], [place] [phone] 4 2 5 5   [facility] [place], [place] 1 3 1 1   [place], [place] 2 2 4 4   [facility] [address] [place] 2 1 2 1   [place]. [place] 3 3 4 4   [facility] [place] [address] [place] 4 1 3 2   [facility] [address] [place] 4 1 3 2   [address] ([place]) [place], [place] [phone] 3 1 2 1   [facility] [phone], [place] [phone] 3 1 1 1   [facility] [address], [facility] [phone] 3 2 2 2   [facility] ([place]) [place]. [place], [place] [phone] 3 1 1 1   [facility]ust[facility]ock [address] [phone] 4 2 4 4           [place] 4 1 3 2   [facility] [place] [address] [place] 3 1 5 4   [facility], [place] [address], [place] [phone] 1 1 2 1   [facility] [address], [place]  [phone] 2 2 4 4           [place] (no [facility]) [place] [facility] [address], [place] [phone] 5 5 5 5   [facility] (No Hum[employer]) [address], [place] 4 1 4 3   [place] (No UHC) [place], [place] 5 5 5 5   [facility] [address] [facility] [phone] 2 3 5 5   [place] [address], [place] 1 1 2 1   [place] [address], [address] 3 1 1 1   [place], [place] 5 2 5 5   [facility]  [address] [place] 2 2 1 1   [facility] [address] [place] 3 2 1 1   [facility] [place], [place] 2 2 2 2           [facility] [address], [place] [phone] 1 1 1 1   [place] [address], [phone]  [phone] 2 4 3 3   [facility] [place] [address], [phone] 3 2 3 3   [place], [place] 2 1 1 1   [place] (No Humana) [address], [place] 2 2 3 3   [facility]) [address], [facility] [phone] 2 1 1  1  [place], [place] 5 4 5 5   [facility] ([place])  [address], [place] 2 1 2 1   [facility]) [place] [address] [place] 3 1 4 3   [place] [address], [place] 3 3 4 4   [address] [place], [place] 2 3 1 1   [place] [facility]) [address] [place] [phone] 2 1 4 3      Expected Discharge Plan: [place] Barriers to Discharge: Insurance Authorization, Continued Medical Work up, SNF Pending bed offer   Patient Goals and CMS Choice Patient states their goals for this hospitalization and ongoing recovery are:: Rehab CMS Medicare.gov Compare  Post Acute Care list provided to:: Patient Represenative (must comment) Choice offered to / list presented to : Sibling [place] ownership interest in [facility].provided to:: Sibling    Expected Discharge Plan and Services In-house Referral: Clinical Social Work, Hospice / [place] Acute Care Choice: [place] arrangements for the past 2 months: Single Family Home                                      Prior Living Arrangements/Services Living arrangements for the past 2 months: Single Family Home Lives with:: Self Patient language and need for interpreter reviewed:: Yes Do you feel safe going back to the place where you live?: Yes      Need for Family Participation in Patient Care: Yes (Comment) Care giver support system in place?: Yes (comment)   Criminal Activity/Legal Involvement Pertinent to Current Situation/Hospitalization: No - Comment as needed  Activities of Daily Living      Permission Sought/Granted Permission sought to share information with : Facility Sport and exercise psychologist, Family Supports Permission granted to share information with : Yes, Verbal Permission Granted  Share Information with NAME: [name]  Permission granted to share info w AGENCY: SNFs  Permission granted to share info w Relationship: Brother  Permission granted to share info w Contact Information: [phone]  Emotional Assessment Appearance:: Appears stated age Attitude/Demeanor/Rapport: Unable to Assess Affect (typically observed): Unable to Assess Orientation: : Oriented to Self Alcohol / Substance Use: Not Applicable Psych Involvement: No (comment)  Admission diagnosis:  Dehydration [E86.0] Failure to thrive in adult [R62.7] AKI (acute kidney injury) ([place]) [N17.9] Patient Active Problem List   Diagnosis Date Noted   NSVT (nonsustained ventricular tachycardia) ([place]) 10/26/2022   Pressure injury of skin 10/26/2022    Nonrheumatic aortic valve insufficiency 10/26/2022   AKI (acute kidney injury) ([place]) 10/24/2022   Cigarette smoker 11/27/2020   Frequent falls 07/21/2019   Unintentional weight loss 07/01/2016   Anorexia 03/11/2016   Syncope 10/31/2015   Depression 04/19/2015   HIV disease ([place]) 04/18/2015   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   Essential hypertension 03/30/2015   Asthma, chronic 03/30/2015   Malignant neoplasm of bladder ([place]) 03/30/2015   PCP:  Fenton Foy, NP Pharmacy:   [facility] [place] [address] Phone: [phone] Fax: [phone]  [id number] [place], [address] [place] [address] Phone: [phone] Fax: [phone]     Social Determinants of Health (SDOH) Social History: SDOH Screenings   Food Insecurity: No Food Insecurity (09/25/2020)  Housing: Low  Risk  (09/25/2020)  Transportation Needs: Unmet Transportation Needs (08/24/2020)  Alcohol Screen: Low Risk  (07/28/2019)  Depression (PHQ2-9): Low Risk  (04/23/2022)  Financial Resource Strain: Medium Risk (04/28/2019)  Physical Activity: Inactive (04/28/2019)  Social Connections: Socially Isolated (04/06/2020)  Stress: No Stress Concern Present (09/21/2019)  Tobacco Use: High Risk (04/23/2022)   SDOH Interventions:     Readmission Risk Interventions     No data to display

## 2022-10-27 NOTE — Evaluation (Signed)
Physical Therapy Evaluation Patient Details Name: [name] MRN: [id number] DOB: [birth year] Today's Date: 10/27/2022  History of Present Illness  Pt is a 67 y/o M admitted to  [facility] Center on [facility] for AMS and failure to thrive. CT of head without acute hemorrhage or infarct, noted white matter changes and moderate parenchymal volume loss. Chest x-ray without active disease. PMHx: HIV, HTN, bladder and prostatic urethra cancer, HTN, alcohol abuse, asthma.  Clinical Impression  Pt presents today with impaired functional mobility, strength, balance, cognition, and activity tolerance. Pt is a poor historian, but seemed to live alone at baseline and was independent with mobility, however currently requiring maxA for bed mobility with min-modA to sit EOB, fatiguing quickly and unable to tolerate attempts at transfers or ambulation. Acute PT will continue to follow up with pt to progress mobility and address deficits. Recommend SNF at this time as pt lives alone and limited assist from family available. Acute PT will follow as appropriate.        Recommendations for follow up therapy are one component of a multi-disciplinary discharge planning process, led by the attending physician.  Recommendations may be updated based on patient status, additional functional criteria and insurance authorization.  Follow Up Recommendations  Skilled Nursing Facility   Can patient physically be transported by private vehicle: No     Assistance Recommended at Discharge Frequent or constant Supervision/Assistance  Patient can return home with the following  Two people to help with walking and/or transfers;A lot of help with bathing/dressing/bathroom;Direct supervision/assist for medications management;Assistance with cooking/housework;Help with stairs or ramp for entrance;Assist for transportation    Equipment Recommendations Other (comment) (defer to next level)  Recommendations for Other Services  OT consult    Functional  Status Assessment Patient has had a recent decline in their functional status and/or demonstrates limited ability to make significant improvements in function in a reasonable and predictable amount of time     Precautions / Restrictions Precautions Precautions: Fall Restrictions Weight Bearing Restrictions: No      Mobility  Bed Mobility Overal bed mobility: Needs Assistance Bed Mobility: Supine to Sit, Sit to Supine     Supine to sit: Max assist, HOB elevated Sit to supine: Max assist, HOB elevated   General bed mobility comments: use of siderail, maxA for bed mobility for BLE management and trunk support    Transfers                   General transfer comment: deferred as pt fatiguing while seated EOB    Ambulation/Gait         Gait velocity: deferred as pt fatiguing while EOB        Stairs            Wheelchair Mobility    Modified Rankin (Stroke Patients Only)       Balance Overall balance assessment: Needs assistance Sitting-balance support: Bilateral upper extremity supported, Feet supported Sitting balance-Leahy Scale: Poor Sitting balance - Comments: requiring min-modA for sitting balance Postural control: Posterior lean                                   Pertinent Vitals/Pain Pain Assessment Pain Assessment: Faces Faces Pain Scale: Hurts little more Pain Location: generalized with mobility Pain Descriptors / Indicators: Grimacing, Guarding Pain Intervention(s): Limited activity within patient's tolerance, Monitored during session, Repositioned    Home Living Family/patient expects to be discharged to:: Private  residence Living Arrangements: Alone Available Help at Discharge: Neighbor;Family;Available PRN/intermittently Type of Home: House Home Access: Stairs to enter   [employer] of Steps: pt unsure of steps or handrails   Home Layout: One level   Additional Comments: pt is poor historian, some  family at bedside but they report not living in the area, pt reporting steps to enter but unsure of how many or rails, declining using DME for mobility but unsure if he owns any, unsure of home layout    Prior Function Prior Level of Function : Independent/Modified Independent             Mobility Comments: pt reports being independent with mobility at baseline, no DME. Doesn't drive but walks or catches rides as needed for transportation       Hand Dominance        Extremity/Trunk Assessment   Upper Extremity Assessment Upper Extremity Assessment: Generalized weakness    Lower Extremity Assessment Lower Extremity Assessment: Generalized weakness    Cervical / Trunk Assessment Cervical / Trunk Assessment: Kyphotic  Communication   Communication: No difficulties  Cognition Arousal/Alertness: Awake/alert Behavior During Therapy: Flat affect Overall Cognitive Status: Impaired/Different from baseline Area of Impairment: Orientation, Memory, Following commands, Problem solving, Safety/judgement                 Orientation Level: Disoriented to, Time   Memory: Decreased short-term memory Following Commands: Follows one step commands with increased time Safety/Judgement: Decreased awareness of safety, Decreased awareness of deficits   Problem Solving: Slow processing, Decreased initiation, Requires verbal cues General Comments: Family at bedside but they report living out of town so difficult to establish baseline. Pt oriented to self and place but not time. Increased time for command following with verbal cues for technique and safety. Pt with difficulty recalling home set-up and PLOF        General Comments General comments (skin integrity, edema, etc.): vitals stable on room air but pt fatiguing with sitting EOB    Exercises     Assessment/Plan    PT Assessment Patient needs continued PT services  PT Problem List Decreased strength;Decreased activity  tolerance;Decreased balance;Decreased mobility;Decreased cognition;Decreased knowledge of use of DME;Decreased safety awareness;Cardiopulmonary status limiting activity;Decreased knowledge of precautions       PT Treatment Interventions DME instruction;Gait training;Functional mobility training;Therapeutic activities;Therapeutic exercise;Balance training;Neuromuscular re-education;Stair training;Cognitive remediation;Patient/family education    PT Goals (Current goals can be found in the Care Plan section)  Acute Rehab PT Goals Patient Stated Goal: get better PT Goal Formulation: With patient/family Time For Goal Achievement: 11/10/22 Potential to Achieve Goals: Fair    Frequency Min 2X/week     Co-evaluation               AM-PAC PT "6 Clicks" Mobility  Outcome Measure Help needed turning from your back to your side while in a flat bed without using bedrails?: A Lot Help needed moving from lying on your back to sitting on the side of a flat bed without using bedrails?: A Lot Help needed moving to and from a bed to a chair (including a wheelchair)?: Total Help needed standing up from a chair using your arms (e.g., wheelchair or bedside chair)?: Total Help needed to walk in hospital room?: Total Help needed climbing 3-5 steps with a railing? : Total 6 Click Score: 8    End of Session   Activity Tolerance: Patient limited by fatigue Patient left: in bed;with call bell/phone within reach;with bed alarm set;with family/visitor  present Nurse Communication: Mobility status PT Visit Diagnosis: Adult, failure to thrive (R62.7);Difficulty in walking, not elsewhere classified (R26.2);Muscle weakness (generalized) (M62.81)    Time: [id number] PT Time Calculation (min) (ACUTE ONLY): 16 min   Charges:   PT Evaluation $PT Eval Moderate Complexity: 1 Mod          Charlynne Cousins, PT DPT Acute Rehabilitation Services Office [phone]   Luvenia Heller 10/27/2022, 1:51 PM

## 2022-10-27 NOTE — Progress Notes (Signed)
  Transition of Care [facility]) Screening Note   Patient Details  Name: [name] Date of Birth: Jun 06, 1956   Transition of Care [facility]) CM/SW Contact:    Benard Halsted, LCSW Phone Number: 10/27/2022, 8:56 AM    Transition of Care Department [facility]) has reviewed patient. We will continue to monitor patient advancement through interdisciplinary progression rounds. If new patient transition needs arise, please place a TOC consult.

## 2022-10-27 NOTE — Progress Notes (Signed)
ANTICOAGULATION CONSULT NOTE  Pharmacy Consult for heparin Indication: chest pain/ACS  Allergies  Allergen Reactions   Aspirin Shortness Of Breath, Nausea And Vomiting and Swelling    Patient Measurements: Height: 5\' 4"  (162.6 cm) Weight: 48.6 kg (107 lb 2.3 oz) IBW/kg (Calculated) : 59.2 Heparin Dosing Weight: TBW  Vital Signs: Temp: 98 F (36.7 C) (03/25 0451) Temp Source: Oral (03/25 0451) BP: 113/56 (03/25 0451) Pulse Rate: 83 (03/25 0451)  Labs: Recent Labs    10/25/22 0238 10/26/22 0144 10/26/22 0912 10/26/22 1202 10/26/22 2051 10/27/22 0647  HGB 10.4* 9.6*  --   --   --  9.9*  HCT 30.4* 28.5*  --   --   --  31.3*  PLT 186 151  --   --   --  135*  HEPARINUNFRC  --   --   --   --  0.40 0.30  CREATININE 4.67* 2.18*  --   --   --  1.59*  TROPONINIHS  --   --  3,101* 2,292*  --   --      Estimated Creatinine Clearance: 30.1 mL/min (A) (by C-G formula based on SCr of 1.59 mg/dL (H)).  Assessment: 62 yom admitted with AMS and AKI. Pharmacy consulted for IV heparin for ACS/chest pain. PMH: COPD, HIV, HTN, bladder tumor. He had enoxaparin 30 mg at 09:53 on 3/24. Troponin 3101>>2292. AKI improving, SCr down to 1.59. No bleeding noted, Hgb 9.9, platelets are down to 135.  Heparin level at lower end of therapeutic (0.3) on infusion at 600 units/hr. No bleeding noted.  Goal of Therapy:  Heparin level 0.3-0.7 units/ml Monitor platelets by anticoagulation protocol: Yes   Plan:  Increase IV heparin to 650 units/hr  Heparin level in 8 hours  Tiffiney Sparrow A. [name], PharmD, BCPS, FNKF Clinical Pharmacist [place] Please utilize Amion for appropriate phone number to reach the unit pharmacist ([place])  10/27/2022 7:32 AM

## 2022-10-27 NOTE — Care Management Important Message (Signed)
Important Message  Patient Details  Name: [name] MRN: [id number] Date of Birth: 05-06-1956   Medicare Important Message Given:  Yes     Evolette Pendell [address] Circle 10/27/2022, 3:41 PM

## 2022-10-27 NOTE — Progress Notes (Signed)
ANTICOAGULATION CONSULT NOTE  Pharmacy Consult for heparin Indication: chest pain/ACS  Allergies  Allergen Reactions   Aspirin Shortness Of Breath, Nausea And Vomiting and Swelling    Patient Measurements: Height: 5\' 4"  (162.6 cm) Weight: 48.6 kg (107 lb 2.3 oz) IBW/kg (Calculated) : 59.2 Heparin Dosing Weight: TBW  Vital Signs: Temp: 97.5 F (36.4 C) (03/25 1600) Temp Source: Oral (03/25 1600) BP: 103/50 (03/25 1600) Pulse Rate: 97 (03/25 0800)  Labs: Recent Labs    10/25/22 0238 10/26/22 0144 10/26/22 0912 10/26/22 1202 10/26/22 2051 10/27/22 0647 10/27/22 1618  HGB 10.4* 9.6*  --   --   --  9.9*  --   HCT 30.4* 28.5*  --   --   --  31.3*  --   PLT 186 151  --   --   --  135*  --   HEPARINUNFRC  --   --   --   --  0.40 0.30 0.22*  CREATININE 4.67* 2.18*  --   --   --  1.59*  --   TROPONINIHS  --   --  3,101* 2,292*  --   --   --      Estimated Creatinine Clearance: 30.1 mL/min (A) (by C-G formula based on SCr of 1.59 mg/dL (H)).  Assessment: 21 yom admitted with AMS and AKI. Pharmacy consulted for IV heparin for ACS/chest pain. PMH: COPD, HIV, HTN, bladder tumor. He had enoxaparin 30 mg at 09:53 on 3/24. Troponin 3101>>2292. AKI improving, SCr down to 1.59. No bleeding noted, Hgb 9.9, platelets are down to 135.  Heparin level at lower end of therapeutic (0.3) on infusion at 600 units/hr. No bleeding noted.  3/25 PM update: HL 0.22 No signs of bleeding or issues with infusion over the past 8 hours since last dose rate change  Goal of Therapy:  Heparin level 0.3-0.7 units/ml Monitor platelets by anticoagulation protocol: Yes   Plan:  Give a 500 unit bolus f/b An increase IV heparin to 750 units/hr  Heparin level in 8 hours  Kinsler Soeder BS, PharmD, BCPS Clinical Pharmacist 10/27/2022 4:49 PM  Contact: [phone] after 3 PM  "Be curious, not judgmental..." -Jamal Maes

## 2022-10-28 DIAGNOSIS — E860 Dehydration: Secondary | ICD-10-CM | POA: Diagnosis not present

## 2022-10-28 DIAGNOSIS — Z7189 Other specified counseling: Secondary | ICD-10-CM | POA: Diagnosis not present

## 2022-10-28 DIAGNOSIS — Z515 Encounter for palliative care: Secondary | ICD-10-CM | POA: Diagnosis not present

## 2022-10-28 DIAGNOSIS — I214 Non-ST elevation (NSTEMI) myocardial infarction: Secondary | ICD-10-CM

## 2022-10-28 DIAGNOSIS — N179 Acute kidney failure, unspecified: Secondary | ICD-10-CM | POA: Diagnosis not present

## 2022-10-28 DIAGNOSIS — Z66 Do not resuscitate: Secondary | ICD-10-CM | POA: Diagnosis not present

## 2022-10-28 LAB — CBC
HCT: 26.7 % — ABNORMAL LOW (ref 39.0–52.0)
Hemoglobin: 8.8 g/dL — ABNORMAL LOW (ref 13.0–17.0)
MCH: 34.1 pg — ABNORMAL HIGH (ref 26.0–34.0)
MCHC: 33 g/dL (ref 30.0–36.0)
MCV: 103.5 fL — ABNORMAL HIGH (ref 80.0–100.0)
Platelets: 132 10*3/uL — ABNORMAL LOW (ref 150–400)
RBC: 2.58 MIL/uL — ABNORMAL LOW (ref 4.22–5.81)
RDW: 14.3 % (ref 11.5–15.5)
WBC: 6.5 10*3/uL (ref 4.0–10.5)
nRBC: 0 % (ref 0.0–0.2)

## 2022-10-28 LAB — HEPARIN LEVEL (UNFRACTIONATED): Heparin Unfractionated: 0.32 IU/mL (ref 0.30–0.70)

## 2022-10-28 LAB — BASIC METABOLIC PANEL WITH GFR
Anion gap: 6 (ref 5–15)
BUN: 27 mg/dL — ABNORMAL HIGH (ref 8–23)
CO2: 14 mmol/L — ABNORMAL LOW (ref 22–32)
Calcium: 7.4 mg/dL — ABNORMAL LOW (ref 8.9–10.3)
Chloride: 123 mmol/L — ABNORMAL HIGH (ref 98–111)
Creatinine, Ser: 1.63 mg/dL — ABNORMAL HIGH (ref 0.61–1.24)
GFR, Estimated: 45 mL/min — ABNORMAL LOW
Glucose, Bld: 120 mg/dL — ABNORMAL HIGH (ref 70–99)
Potassium: 3.4 mmol/L — ABNORMAL LOW (ref 3.5–5.1)
Sodium: 143 mmol/L (ref 135–145)

## 2022-10-28 LAB — MAGNESIUM: Magnesium: 2.2 mg/dL (ref 1.7–2.4)

## 2022-10-28 LAB — BRAIN NATRIURETIC PEPTIDE: B Natriuretic Peptide: 1161.9 pg/mL — ABNORMAL HIGH (ref 0.0–100.0)

## 2022-10-28 MED ORDER — POTASSIUM CHLORIDE 10 MEQ/100ML IV SOLN
10.0000 meq | INTRAVENOUS | Status: AC
  Administered 2022-10-28 (×3): 10 meq via INTRAVENOUS
  Filled 2022-10-28 (×3): qty 100

## 2022-10-28 NOTE — Progress Notes (Signed)
Subjective:  Chest pain Shortness of breath   Current Facility-Administered Medications:    (feeding supplement) PROSource Plus liquid 30 mL, 30 mL, Oral, TID BM, Kc, Ramesh, MD, 30 mL at 10/27/22 0843   acetaminophen (TYLENOL) tablet 650 mg, 650 mg, Oral, Q6H PRN **OR** acetaminophen (TYLENOL) suppository 650 mg, 650 mg, Rectal, Q6H PRN, Jonelle Sidle, Mohammad L, MD   albuterol (PROVENTIL) (2.5 MG/3ML) 0.083% nebulizer solution 3 mL, 3 mL, Inhalation, Q6H PRN, Jonelle Sidle, Mohammad L, MD   calcium carbonate (dosed in mg elemental calcium) suspension 500 mg of elemental calcium, 500 mg of elemental calcium, Oral, Q6H PRN, Jonelle Sidle, Mohammad L, MD   camphor-menthol (SARNA) lotion 1 Application, 1 Application, Topical, [id number] PRN **AND** hydrOXYzine (ATARAX) tablet 25 mg, 25 mg, Oral, Q8H PRN, Jonelle Sidle, Mohammad L, MD   Darunavir-Cobicistat-Emtricitabine-Tenofovir Alafenamide (SYMTUZA) 800-150-200-10 MG TABS 1 tablet, 1 tablet, Oral, Q breakfast, Jonelle Sidle, Mohammad L, MD, 1 tablet at 10/28/22 0830   docusate sodium (ENEMEEZ) enema 283 mg, 1 enema, Rectal, PRN, Jonelle Sidle, Mohammad L, MD   feeding supplement (BOOST / RESOURCE BREEZE) liquid 1 Container, 1 Container, Oral, TID WC, Kc, Ramesh, MD, 1 Container at 10/28/22 1305   feeding supplement (NEPRO CARB STEADY) liquid 237 mL, 237 mL, Oral, TID PRN, [name], MD   folic acid (FOLVITE) tablet 1 mg, 1 mg, Oral, Daily, Kc, Ramesh, MD, 1 mg at 10/28/22 [id number]   food thickener (SIMPLYTHICK (NECTAR/LEVEL 2/MILDLY THICK)) 1 packet, 1 packet, Oral, PRN, Kc, Ramesh, MD   heparin ADULT infusion 100 units/mL (25000 units/295mL), 750 Units/hr, Intravenous, Continuous, Reome, Earle J, RPH, Last Rate: 7.5 mL/hr at 10/27/22 2318, 750 Units/hr at 10/27/22 2318   ipratropium-albuterol (DUONEB) 0.5-2.5 (3) MG/3ML nebulizer solution 3 mL, 3 mL, Nebulization, Q6H PRN, Jonelle Sidle, Mohammad L, MD   metoprolol tartrate (LOPRESSOR) tablet 25 mg, 25 mg, Oral, BID, Kc, Ramesh, MD, 25 mg at 10/27/22  0842   morphine (PF) 2 MG/ML injection 1 mg, 1 mg, Intravenous, Q4H PRN, Antonieta Pert, MD   multivitamin with minerals tablet 1 tablet, 1 tablet, Oral, Daily, Kc, Ramesh, MD, 1 tablet at 10/28/22 0829   nitroGLYCERIN (NITROSTAT) SL tablet 0.4 mg, 0.4 mg, Sublingual, Q5 min PRN, Kc, Ramesh, MD, 0.4 mg at 10/26/22 1153   ondansetron (ZOFRAN) tablet 4 mg, 4 mg, Oral, Q6H PRN **OR** ondansetron (ZOFRAN) injection 4 mg, 4 mg, Intravenous, Q6H PRN, Jonelle Sidle, Mohammad L, MD, 4 mg at 10/25/22 0112   potassium chloride 10 mEq in 100 mL IVPB, 10 mEq, Intravenous, Q1 Hr x 3, Thurnell Lose, MD, Last Rate: 100 mL/hr at 10/28/22 1305, 10 mEq at 10/28/22 1305   rosuvastatin (CRESTOR) tablet 20 mg, 20 mg, Oral, Daily, Kc, Ramesh, MD, 20 mg at 10/28/22 0829   sorbitol 70 % solution 30 mL, 30 mL, Oral, PRN, [name], MD   thiamine (VITAMIN B1) tablet 100 mg, 100 mg, Oral, Daily, Kc, Ramesh, MD, 100 mg at 10/28/22 0829   zolpidem (AMBIEN) tablet 5 mg, 5 mg, Oral, QHS PRN, Gala Romney L, MD, 5 mg at 10/25/22 0315   Objective:  Vital Signs in the last 24 hours: Temp:  [97.5 F (36.4 C)-98.6 F (37 C)] 98 F (36.7 C) (03/26 0800) Pulse Rate:  [76-93] 93 (03/26 1038) Resp:  [15-19] 15 (03/26 0800) BP: (96-109)/(47-56) 96/56 (03/26 1038) SpO2:  [97 %-100 %] 98 % (03/26 0800) Weight:  [48.1 kg] 48.1 kg (03/26 0500)  Intake/Output from previous day: 03/25 0701 - 03/26 0700 In: -  Out: 900 [Urine:900]  Physical Exam Vitals and nursing note reviewed.  Constitutional:      General: He is not in acute distress.    Appearance: He is cachectic. He is ill-appearing.  Neck:     Vascular: No JVD.  Cardiovascular:     Rate and Rhythm: Normal rate and regular rhythm.     Heart sounds: Normal heart sounds. No murmur heard. Pulmonary:     Effort: Pulmonary effort is normal.     Breath sounds: Normal breath sounds. No wheezing or rales.  Musculoskeletal:     Right lower leg: No edema.     Left lower  leg: No edema.      Imaging/tests reviewed and independently interpreted: CBC, BMP, trop HS< BNP   Latest Reference Range & Units 10/26/22 09:12 10/26/22 12:02  Troponin I (High Sensitivity) <18 ng/L 3,101 (HH) 2,292 (HH)  ([place]): Data is critically high   Latest Reference Range & Units 10/26/22 09:13 10/27/22 06:47 10/28/22 05:11  B Natriuretic Peptide 0.0 - 100.0 pg/mL 1,293.6 (H) 853.6 (H) 1,161.9 (H)  (H): Data is abnormally high  Cardiac Studies:  Telemetry 10/28/2022: No significant arrhythmia  EKG 10/26/2022: Sinus tachycardia 116 bpm Septal infarct, age indeterminate (new since previous EKG on 10/07/2022) Anterolateral ischemia    Echocardiogram 10/26/2022:  1. Left ventricular ejection fraction, by estimation, is 50 to 55%. The  left ventricle has low normal function. The left ventricle has no regional  wall motion abnormalities. Left ventricular diastolic parameters are  consistent with Grade I diastolic  dysfunction (impaired relaxation).   2. Right ventricular systolic function is normal. The right ventricular  size is normal.   3. The mitral valve is normal in structure. No evidence of mitral valve  regurgitation. No evidence of mitral stenosis.   4. In the limited visualization of the aortic valve, it is not possible  to rule out vegetation. . The aortic valve was not well visualized. Aortic  valve regurgitation is moderate to severe.     Assessment & Recommendations:  67 y.o. 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 Etiology unclear.  Echocardiogram without definite wall motion abnormality, low normal LVEF. His exam was limited due to barrel shaped chest, and I did not appreciate murmur, bur echocardiogram shows moderate to severe aortic regurgitation. LV size is normal. In the limited visualization of the aortic valve, I cannot  determine the etiology of the aortic regurgitation and cannot evaluate for presence or absence of vegetation.   Given his underlying medical comorbidities, failure to thrive, I do not think workup and management of AI would change overall outcome. I have discussed this with patient, his brother [name], and primary attending Dr. Candiss Norse.  Overall, I do not think this finding meaningfully changes prior decision making with regards to overall comfort measures.  Troponin elevation: Possible recent infarct. Can stop heparin now.   PSVT: Okay to stop IV amiodarone.  Discussed interpretation of tests and management recommendations with the primary team   Nigel Mormon, MD Pager: [phone] Office: [phone]

## 2022-10-28 NOTE — Progress Notes (Signed)
  Daily Progress Note   Patient Name: [name]       Date: 10/28/2022 DOB: 1956/02/18  Age: 67 y.o. MRN#: [id number] Attending Physician: Thurnell Lose, MD Primary Care Physician: Fenton Foy, NP Admit Date: 10/24/2022 Length of Stay: 4 days  Reason for Consultation/Follow-up: {Reason for Consult:23484}  HPI/Patient Profile:  ***  Subjective:   Subjective: Chart Reviewed. Updates received. Patient Assessed. Created space and opportunity for patient  and family to explore thoughts and feelings regarding current medical situation.  Today's Discussion: ***  Review of Systems  Objective:   Vital Signs:  BP (!) 96/56   Pulse 93   Temp 98 F (36.7 C)   Resp 15   Ht 5\' 4"  (1.626 m)   Wt 48.1 kg   SpO2 98%   BMI 18.20 kg/m   Physical Exam: Physical Exam  Palliative Assessment/Data: ***    Existing Vynca/ACP Documentation: ***  Assessment & Plan:   Impression: Present on Admission:  Essential hypertension  Malignant neoplasm of bladder (HCC)  Unintentional weight loss  HIV disease (HCC)  Asthma, chronic  Depression  AKI (acute kidney injury) ([place])  Pressure injury of skin  ***  SUMMARY OF RECOMMENDATIONS   ***  Symptom Management:  ***  Code Status: {Palliative Code status:23503}  Prognosis: {Palliative Care Prognosis:23504}  Discharge Planning: {Palliative dispostion:23505}  Discussed with: ***  Thank you for allowing [place] to participate in the care of Troye Yamashiro PMT will continue to support holistically.  Time Total: ***  Visit consisted of counseling and education dealing with the complex and emotionally intense issues of symptom management and palliative care in the setting of serious and potentially life-threatening illness. Greater than 50%  of this time was spent counseling and coordinating care related to the above assessment and plan.  Walden Field, NP Palliative Medicine Team  Team Phone # [phone] (Nights/Weekends)   04/02/2021, 8:17 AM

## 2022-10-28 NOTE — Progress Notes (Signed)
ANTICOAGULATION CONSULT NOTE  Pharmacy Consult for heparin Indication: chest pain/ACS  Allergies  Allergen Reactions   Aspirin Shortness Of Breath, Nausea And Vomiting and Swelling    Patient Measurements: Height: 5\' 4"  (162.6 cm) Weight: 48.1 kg (106 lb 0.7 oz) IBW/kg (Calculated) : 59.2 Heparin Dosing Weight: TBW  Vital Signs: Temp: 98.3 F (36.8 C) (03/26 0521) Temp Source: Oral (03/26 0521) BP: 108/47 (03/26 0521) Pulse Rate: 80 (03/26 0521)  Labs: Recent Labs    10/26/22 0144 10/26/22 0912 10/26/22 1202 10/26/22 2051 10/27/22 0647 10/27/22 1618 10/27/22 2311 10/28/22 0511  HGB 9.6*  --   --   --  9.9*  --   --  8.8*  HCT 28.5*  --   --   --  31.3*  --   --  26.7*  PLT 151  --   --   --  135*  --   --  132*  HEPARINUNFRC  --   --   --    < > 0.30 0.22* 0.37 0.32  CREATININE 2.18*  --   --   --  1.59*  --   --  1.63*  TROPONINIHS  --  3,101* 2,292*  --   --   --   --   --    < > = values in this interval not displayed.     Estimated Creatinine Clearance: 29.1 mL/min (A) (by C-G formula based on SCr of 1.63 mg/dL (H)).  Assessment: 16 yom admitted with AMS and AKI. Pharmacy consulted for IV heparin for ACS/chest pain. PMH: COPD, HIV, HTN, bladder tumor. He had enoxaparin 30 mg at 09:53 on 3/24. Troponin 3101>>2292. AKI improving, SCr down to 1.59. No bleeding noted, Hgb 9.9, platelets are down to 135.  Heparin level at lower end of therapeutic (0.32) on infusion at 750 units/hr. No bleeding noted.  Goal of Therapy:  Heparin level 0.3-0.7 units/ml Monitor platelets by anticoagulation protocol: Yes   Plan:  Cont heparin 750 units/hr Heparin level with AM labs  Chelbi Herber A. [name], PharmD, BCPS, FNKF Clinical Pharmacist [place] Please utilize Amion for appropriate phone number to reach the unit pharmacist ([place])

## 2022-10-28 NOTE — Plan of Care (Signed)
  Problem: Education: Goal: Knowledge of disease and its progression will improve Outcome: Not Progressing   Problem: Health Behavior/Discharge Planning: Goal: Ability to manage health-related needs will improve Outcome: Not Progressing   Problem: Clinical Measurements: Goal: Complications related to the disease process or treatment will be avoided or minimized Outcome: Not Progressing Goal: Dialysis access will remain free of complications Outcome: Not Progressing   Problem: Activity: Goal: Activity intolerance will improve Outcome: Not Progressing   Problem: Fluid Volume: Goal: Fluid volume balance will be maintained or improved Outcome: Not Progressing

## 2022-10-28 NOTE — Progress Notes (Addendum)
PROGRESS NOTE [name]  [id number] DOB: 07-28-1956 DOA: 10/27/2022 PCP: Fenton Foy, NP  Brief Narrative/Hospital Course: 67 year old male with reported COPD, HIV, hypertension, bladder tumor alcohol use brought by EMS for altered mental status after a neighbor called for him as he was not acting like himself, smell of urine and a urine bottles everywhere and he has had urinary retention x 5 days. In the ED patient is alert oriented to self place year, limited historian, complaining of a cough and shortness of breath recently and some pain in lower abdomen near his penis, admitted he does drink alcohol and last drink was several days ago.  Vitals w/ HR 135, afebrile, on RA saturating well,  found to have severe dehydration, failure to thrive, AKI w. Creat 5.7, hco3 14, ast 63, bladder scan negative, chest x-ray and CT head no acute finding.  He was given 2 L bolus UA and ultrasound obtained and admitted for further management.   Subjective: Patient in bed sleeping, arousable, denies any headache or chest pain, states feels weak all over.  Assessment and Plan:   Severe AKI Dehydration Metabolic acidosis due to AKI: Bladder scan negative, renal ultrasound chronic medical renal disease without hydronephrosis.Creat improving with IV fluid > cut to 50 cc/hr bnp elevated this am cxr clear tho,hydration, monitor electrolytes, urine output. AKI is most likely prerenal.Avoid nephrotoxic medication,dose meds renally.  Recent Labs    12/31/21 1647 04/23/22 1534 10/17/2022 2016 10/25/22 0238 10/26/22 0144 10/27/22 0647 10/28/22 0511  BUN 14 12 102* 100* 63* 35* 27*  CREATININE 1.06 1.01 5.78* 4.67* 2.18* 1.59* 1.63*   NSTEMI Elevated troponin with ST and T wave changes in anterior leads-NSTEMI Tachycardia w/ narrow complex tachycardia/NSVT: By cardiology underwent echocardiogram showing low normal EF with moderate to severe AS, per cardiology he is currently on amiodarone drip, and heparin  drip, not a candidate for invasive testing or procedures.  Currently chest pain-free.  Defer management to cardiology.  Alcohol abuse: Level less than 10, no withdrawals,watch for withdrawal symptoms and address accordingly, patient last drink was several days ago. Cont thiamine folate vitamins-monitor  ?Seizure disorder per his brother- not on AEDS. Monitor  Hypokalemia.  Replaced.    Poor historian/altered mental status with confusion likely acute metabolic encephalopathy: Continue supportive care gentle hydration  HIV disease followed by ID last CD4 count stable /viral load elevated as below.Continue home [place].  Per [name] his brother- patient's rest of the family-sisters  not aware of this diagnosis and only [name] is aware who has been helping him with his care.   Lab Results  Component Value Date   CD4TABS 405 02/18/2022   CD4TABS 553 12/31/2021   CD4TABS 443 04/02/2021   Lab Results  Component Value Date   HIV1RNAQUANT 109,000 (H) 02/18/2022     Asthma:not wheezing, not in exacerbation. Depression: Mood is stable Hypertension BP controlled on home amlodipine> will discontinue for now as placed him on metoprolol Anemia likely from chronic disease HIV, initial hemoglobin high at 13 g likely hemoconcentrated.  His hemoglobin was in low 10 g 6 months ago- monitor hb Recent Labs  Lab 10/19/2022 2021 10/25/22 0238 10/26/22 0144 10/27/22 0647 10/28/22 0511  HGB 13.3 10.4* 9.6* 9.9* 8.8*  HCT 39.0 30.4* 28.5* 31.3* 26.7*    History of bladder cancer-reportedly patient was having hematuria on last follow-up with ID, question of recurrent bladder cancer, will need to monitor closely while on heparin.  Severe malnutrition BMI 16.9,dietitian consulted-augment aggressively status.Speech eval for swallowing.  Spitting during eating and taking meds:speech eval requested- cont on modified diet.  Goals of care: Had detailed discussion with patient's brother who is the primary  decision maker bedside on 10/27/2022, he understands that his brother may pass away this hospitalization, he wants to continue gentle medical treatment, if any further decline he confirms that he will be pursuing full comfort measures at that time.  DNR.  Pressure ulcer STAGE 2  left buttock POA see below Pressure Injury 10/25/22 Buttocks Left Stage 2 -  Partial thickness loss of dermis presenting as a shallow open injury with a red, pink wound bed without slough. (Active)  10/25/22 0100  Location: Buttocks  Location Orientation: Left  Staging: Stage 2 -  Partial thickness loss of dermis presenting as a shallow open injury with a red, pink wound bed without slough.  Wound Description (Comments):   Present on Admission: Yes  Dressing Type Foam - Lift dressing to assess site every shift 10/25/22 0111   DVT prophylaxis:  Code Status:   Code Status: DNR Family Communication: Mike bedside on 10/27/2022, DNR, gentle medical treatment, if declines full comfort care.  Brother updated again bedside 10/28/2022 Patient status is: Inpatient because of AKI Level of care: MedSurg bed  Dispo: The patient is from: Home            Anticipated disposition: TBD Objective: Vitals last 24 hrs: Vitals:   10/27/22 2003 10/27/22 2332 10/28/22 0500 10/28/22 0521  BP: (!) 109/49 (!) 104/50  (!) 108/47  Pulse: 92 84  80  Resp: 19 15  16   Temp: 98.6 F (37 C) 98.1 F (36.7 C)  98.3 F (36.8 C)  TempSrc: Oral Oral  Oral  SpO2: 100% 97%    Weight:   48.1 kg   Height:       Weight change: 4.01 kg  Physical Examination:  Frail and cachectic middle-aged African-American male lying in hospital bed in no distress but appears extremely tired and fatigued, .AT,PERRAL Supple Neck, No JVD,   Symmetrical Chest wall movement, Good air movement bilaterally, bilateral coarse breath sounds RRR,No Gallops, Rubs or new Murmurs,  +ve B.Sounds, Abd Soft, No tenderness,   No Cyanosis, Clubbing or edema    Medications  reviewed:  Scheduled Meds:  (feeding supplement) PROSource Plus  30 mL Oral TID BM   Darunavir-Cobicistat-Emtricitabine-Tenofovir Alafenamide  1 tablet Oral Q breakfast   feeding supplement  1 Container Oral TID WC   folic acid  1 mg Oral Daily   metoprolol tartrate  25 mg Oral BID   multivitamin with minerals  1 tablet Oral Daily   rosuvastatin  20 mg Oral Daily   thiamine  100 mg Oral Daily   Continuous Infusions:  amiodarone 30 mg/hr (10/28/22 0215)   dextrose 5 % and 0.9% NaCl 50 mL/hr at 10/27/22 2314   heparin 750 Units/hr (10/27/22 2318)   potassium chloride     Diet Order             Diet clear liquid Room service appropriate? Yes; Fluid consistency: Nectar Thick; Fluid restriction: 1200 mL Fluid  Diet effective now                  Intake/Output Summary (Last 24 hours) at 10/28/2022 0945 Last data filed at 10/28/2022 0500 Gross per 24 hour  Intake --  Output 900 ml  Net -900 ml   Net IO Since Admission: -407.78 mL [10/28/22 0945]  Wt Readings from Last 3 Encounters:  10/28/22 48.1  kg  04/23/22 54.4 kg  02/18/22 54 kg    Data Reviewed: I have personally reviewed following labs and imaging studies  Recent Labs  Lab 10/09/2022 2016 10/10/2022 2021 10/25/22 0238 10/26/22 0144 10/27/22 0647 10/28/22 0511  WBC 8.7  --  8.1 4.5 6.8 6.5  HGB 13.2 13.3 10.4* 9.6* 9.9* 8.8*  HCT 38.9* 39.0 30.4* 28.5* 31.3* 26.7*  PLT 234  --  186 151 135* 132*  MCV 102.1*  --  100.3* 101.1* 105.4* 103.5*  MCH 34.6*  --  34.3* 34.0 33.3 34.1*  MCHC 33.9  --  34.2 33.7 31.6 33.0  RDW 13.9  --  13.8 13.6 14.2 14.3  LYMPHSABS 1.0  --   --   --   --   --   MONOABS 0.6  --   --   --   --   --   EOSABS 0.0  --   --   --   --   --   BASOSABS 0.0  --   --   --   --   --     Recent Labs  Lab 11/02/2022 2016 10/08/2022 2021 10/25/22 0238 10/26/22 0144 10/26/22 0912 10/26/22 0913 10/27/22 0647 10/28/22 0511  NA 134* 139 136 143  --   --  145 143  K 4.8 5.1 5.0 3.7  --   --  3.7  3.4*  CL 102  --  108 121*  --   --  123* 123*  CO2 14*  --  17* 17*  --   --  18* 14*  ANIONGAP 18*  --  11 5  --   --  4* 6  GLUCOSE 130*  --  112* 143*  --   --  124* 120*  BUN 102*  --  100* 63*  --   --  35* 27*  CREATININE 5.78*  --  4.67* 2.18*  --   --  1.59* 1.63*  AST 63*  --   --   --   --   --   --   --   ALT 30  --   --   --   --   --   --   --   ALKPHOS 80  --   --   --   --   --   --   --   BILITOT 1.0  --   --   --   --   --   --   --   ALBUMIN 2.7*  --  2.0*  --   --   --   --   --   TSH  --   --   --   --  1.186  --   --   --   BNP  --   --   --   --   --  1,293.6* 853.6* 1,161.9*  MG  --   --   --   --   --   --  2.3 2.2  CALCIUM 9.4  --  8.5* 7.9*  --   --  7.8* 7.4*      Recent Labs  Lab 10/21/2022 2016 10/25/22 0238 10/26/22 0144 10/26/22 0912 10/26/22 0913 10/27/22 0647 10/28/22 0511  TSH  --   --   --  1.186  --   --   --   BNP  --   --   --   --  1,293.6* 853.6* 1,161.9*  MG  --   --   --   --   --  2.3 2.2  CALCIUM 9.4 8.5* 7.9*  --   --  7.8* 7.4*      Antimicrobials: Anti-infectives (From admission, onward)    Start     Dose/Rate Route Frequency Ordered Stop   10/25/22 0845  Darunavir-Cobicistat-Emtricitabine-Tenofovir Alafenamide (SYMTUZA) 800-150-200-10 MG TABS 1 tablet        1 tablet Oral Daily with breakfast 10/22/2022 2310        Culture/Microbiology    Component Value Date/Time   SDES BLOOD RIGHT FOREARM 09/10/2016 0319   SPECREQUEST BOTTLES DRAWN AEROBIC AND ANAEROBIC 10CC EA 09/10/2016 0319   CULT NO GROWTH 5 DAYS 09/10/2016 0319   REPTSTATUS 09/15/2016 FINAL 09/10/2016 0319   Other culture-see note  Radiology Studies: ECHOCARDIOGRAM COMPLETE  Result Date: 10/26/2022    ECHOCARDIOGRAM REPORT   Patient Name:   NAJEH BEEL Date of Exam: 10/26/2022 Medical Rec #:  [id number]  Height:       64.0 in Accession #:    [id number] Weight:       105.8 lb Date of Birth:  02-26-1956   BSA:          1.493 m Patient Age:    48 years   BP:            125/74 mmHg Patient Gender: M          HR:           83 bpm. Exam Location:  Inpatient Procedure: 2D Echo, Cardiac Doppler and Color Doppler STAT ECHO Indications:     ventricular tachycardia  History:         Patient has no prior history of Echocardiogram examinations.                  HIV; Risk Factors:Hypertension and Current Smoker.  Sonographer:     Johny Chess RDCS Referring Phys:  [id number] Anton [place] Bells Diagnosing Phys: Vernell Leep MD  Sonographer Comments: No parasternal window and Technically difficult study due to poor echo windows. Image acquisition challenging due to respiratory motion. IMPRESSIONS  1. Left ventricular ejection fraction, by estimation, is 50 to 55%. The left ventricle has low normal function. The left ventricle has no regional wall motion abnormalities. Left ventricular diastolic parameters are consistent with Grade I diastolic dysfunction (impaired relaxation).  2. Right ventricular systolic function is normal. The right ventricular size is normal.  3. The mitral valve is normal in structure. No evidence of mitral valve regurgitation. No evidence of mitral stenosis.  4. In the limited visualization of the aortic valve, it is not possible to rule out vegetation. . The aortic valve was not well visualized. Aortic valve regurgitation is moderate to severe. FINDINGS  Left Ventricle: Left ventricular ejection fraction, by estimation, is 50 to 55%. The left ventricle has low normal function. The left ventricle has no regional wall motion abnormalities. The left ventricular internal cavity size was normal in size. There is no left ventricular hypertrophy. Left ventricular diastolic parameters are consistent with Grade I diastolic dysfunction (impaired relaxation). Normal left ventricular filling pressure. Right Ventricle: The right ventricular size is normal. No increase in right ventricular wall thickness. Right ventricular systolic function is normal. Left Atrium: Left atrial size was  normal in size. Right Atrium: Right atrial size was normal in size. Pericardium: There is no evidence of pericardial effusion. Mitral Valve: The mitral valve is normal in structure. No evidence of mitral valve regurgitation. No evidence of mitral valve stenosis. Tricuspid Valve: The tricuspid valve is normal in structure. Tricuspid  valve regurgitation is not demonstrated. No evidence of tricuspid stenosis. Aortic Valve: In the limited visualization of the aortic valve, it is not possible to rule out vegetation. The aortic valve was not well visualized. Aortic valve regurgitation is moderate to severe. Aortic regurgitation PHT measures 248 msec. Pulmonic Valve: The pulmonic valve was normal in structure. Pulmonic valve regurgitation is not visualized. No evidence of pulmonic stenosis. Aorta: The aortic root is normal in size and structure. IAS/Shunts: The interatrial septum was not assessed.  LEFT VENTRICLE PLAX 2D LVIDd:         5.00 cm     Diastology LVIDs:         2.80 cm     LV e' medial:    6.64 cm/s LV PW:         0.90 cm     LV E/e' medial:  7.0 LV IVS:        1.00 cm     LV e' lateral:   7.18 cm/s LVOT diam:     1.80 cm     LV E/e' lateral: 6.5 LV SV:         44 LV SV Index:   30 LVOT Area:     2.54 cm  LV Volumes (MOD) LV vol d, MOD A4C: 92.7 ml LV vol s, MOD A4C: 46.4 ml LV SV MOD A4C:     92.7 ml RIGHT VENTRICLE            IVC RV S prime:     7.29 cm/s  IVC diam: 1.50 cm LEFT ATRIUM           Index        RIGHT ATRIUM           Index LA diam:      2.60 cm 1.74 cm/m   RA Area:     10.10 cm LA Vol (A4C): 21.8 ml 14.60 ml/m  RA Volume:   21.10 ml  14.14 ml/m  AORTIC VALVE LVOT Vmax:   104.50 cm/s LVOT Vmean:  69.750 cm/s LVOT VTI:    0.174 m AI PHT:      248 msec  AORTA Ao Root diam: 2.80 cm MITRAL VALVE MV Area (PHT): 3.85 cm    SHUNTS MV Decel Time: 197 msec    Systemic VTI:  0.17 m MV E velocity: 46.40 cm/s  Systemic Diam: 1.80 cm MV A velocity: 52.10 cm/s MV E/A ratio:  0.89 Manish Patwardhan MD  Electronically signed by Vernell Leep MD Signature Date/Time: 10/26/2022/3:07:11 PM    Final      LOS: 4 days   Lala Lund, MD [facility]  10/28/2022, 9:45 AM

## 2022-10-28 NOTE — Progress Notes (Signed)
ANTICOAGULATION CONSULT NOTE  Pharmacy Consult for heparin Indication: chest pain/ACS  Allergies  Allergen Reactions   Aspirin Shortness Of Breath, Nausea And Vomiting and Swelling    Patient Measurements: Height: 5\' 4"  (162.6 cm) Weight: 48.6 kg (107 lb 2.3 oz) IBW/kg (Calculated) : 59.2 Heparin Dosing Weight: TBW  Vital Signs: Temp: 98.1 F (36.7 C) (03/25 2332) Temp Source: Oral (03/25 2332) BP: 104/50 (03/25 2332) Pulse Rate: 84 (03/25 2332)  Labs: Recent Labs    10/25/22 0238 10/26/22 0144 10/26/22 0912 10/26/22 1202 10/26/22 2051 10/27/22 0647 10/27/22 1618 10/27/22 2311  HGB 10.4* 9.6*  --   --   --  9.9*  --   --   HCT 30.4* 28.5*  --   --   --  31.3*  --   --   PLT 186 151  --   --   --  135*  --   --   HEPARINUNFRC  --   --   --   --    < > 0.30 0.22* 0.37  CREATININE 4.67* 2.18*  --   --   --  1.59*  --   --   TROPONINIHS  --   --  3,101* 2,292*  --   --   --   --    < > = values in this interval not displayed.     Estimated Creatinine Clearance: 30.1 mL/min (A) (by C-G formula based on SCr of 1.59 mg/dL (H)).  Assessment: 6 yom admitted with AMS and AKI. Pharmacy consulted for IV heparin for ACS/chest pain. PMH: COPD, HIV, HTN, bladder tumor. He had enoxaparin 30 mg at 09:53 on 3/24. Troponin 3101>>2292. AKI improving, SCr down to 1.59. No bleeding noted, Hgb 9.9, platelets are down to 135.  Heparin level at lower end of therapeutic (0.3) on infusion at 600 units/hr. No bleeding noted.  3/26 AM update: Heparin level is therapeutic after rate increase  Goal of Therapy:  Heparin level 0.3-0.7 units/ml Monitor platelets by anticoagulation protocol: Yes   Plan:  Cont heparin 750 units/hr Heparin level with AM labs  Narda Bonds, PharmD, [place] Pharmacist Phone: [phone]

## 2022-10-28 NOTE — Plan of Care (Signed)
                                      [name]                            [address] [place], [place] [address]      [name] was admitted to the Hospital on 10/24/2022 and still remains here, with critical illness his brother [name],  DOB 12/16/1970 has been bedside attending to his care, he should be excused from work for 14 days for this reason.   Call Allen Lund MD, [facility]  [phone] with questions.  [name] M.D on 10/28/2022,at 7:27 AM  [facility]   Office  [phone]

## 2022-10-28 NOTE — Progress Notes (Signed)
SLP Cancellation Note  Patient Details Name: [name] MRN: [id number] DOB: 1956-06-29   Cancelled treatment:       Reason Eval/Treat Not Completed: Patient declined, no reason specified. Patient declining to have anything to drink and doesn't want to do anything. Per his brother, he has been "in and out of it all day" and that he has mainly wanted to rest. SLP will f/u next date schedule permitting.  Sonia Baller, MA, CCC-SLP Speech Therapy'

## 2022-10-29 ENCOUNTER — Inpatient Hospital Stay (HOSPITAL_COMMUNITY): Payer: 59

## 2022-10-29 ENCOUNTER — Other Ambulatory Visit (HOSPITAL_COMMUNITY): Payer: Self-pay

## 2022-10-29 DIAGNOSIS — C679 Malignant neoplasm of bladder, unspecified: Secondary | ICD-10-CM

## 2022-10-29 DIAGNOSIS — Z66 Do not resuscitate: Secondary | ICD-10-CM

## 2022-10-29 DIAGNOSIS — I214 Non-ST elevation (NSTEMI) myocardial infarction: Secondary | ICD-10-CM

## 2022-10-29 DIAGNOSIS — E860 Dehydration: Secondary | ICD-10-CM | POA: Diagnosis not present

## 2022-10-29 DIAGNOSIS — R634 Abnormal weight loss: Secondary | ICD-10-CM

## 2022-10-29 DIAGNOSIS — I4729 Other ventricular tachycardia: Secondary | ICD-10-CM

## 2022-10-29 DIAGNOSIS — I351 Nonrheumatic aortic (valve) insufficiency: Secondary | ICD-10-CM

## 2022-10-29 DIAGNOSIS — F3342 Major depressive disorder, recurrent, in full remission: Secondary | ICD-10-CM

## 2022-10-29 DIAGNOSIS — Z515 Encounter for palliative care: Secondary | ICD-10-CM | POA: Diagnosis not present

## 2022-10-29 DIAGNOSIS — Z7189 Other specified counseling: Secondary | ICD-10-CM | POA: Diagnosis not present

## 2022-10-29 LAB — BASIC METABOLIC PANEL
Anion gap: 5 (ref 5–15)
BUN: 23 mg/dL (ref 8–23)
CO2: 21 mmol/L — ABNORMAL LOW (ref 22–32)
Calcium: 7.7 mg/dL — ABNORMAL LOW (ref 8.9–10.3)
Chloride: 119 mmol/L — ABNORMAL HIGH (ref 98–111)
Creatinine, Ser: 1.67 mg/dL — ABNORMAL HIGH (ref 0.61–1.24)
GFR, Estimated: 44 mL/min — ABNORMAL LOW (ref >60)
Glucose, Bld: 98 mg/dL (ref 70–99)
Potassium: 3.7 mmol/L (ref 3.5–5.1)
Sodium: 145 mmol/L (ref 135–145)

## 2022-10-29 LAB — CBC
HCT: 29.7 % — ABNORMAL LOW (ref 39.0–52.0)
Hemoglobin: 9.7 g/dL — ABNORMAL LOW (ref 13.0–17.0)
MCH: 33.4 pg (ref 26.0–34.0)
MCHC: 32.7 g/dL (ref 30.0–36.0)
MCV: 102.4 fL — ABNORMAL HIGH (ref 80.0–100.0)
Platelets: 145 10*3/uL — ABNORMAL LOW (ref 150–400)
RBC: 2.9 MIL/uL — ABNORMAL LOW (ref 4.22–5.81)
RDW: 14.4 % (ref 11.5–15.5)
WBC: 6.2 10*3/uL (ref 4.0–10.5)
nRBC: 0.3 % — ABNORMAL HIGH (ref 0.0–0.2)

## 2022-10-29 LAB — HEPARIN LEVEL (UNFRACTIONATED): Heparin Unfractionated: 0.34 IU/mL (ref 0.30–0.70)

## 2022-10-29 LAB — BRAIN NATRIURETIC PEPTIDE: B Natriuretic Peptide: 976.7 pg/mL — ABNORMAL HIGH (ref 0.0–100.0)

## 2022-10-29 LAB — MAGNESIUM: Magnesium: 1.9 mg/dL (ref 1.7–2.4)

## 2022-10-29 MED ORDER — SODIUM CHLORIDE 0.9 % IV SOLN: Freq: Once | INTRAVENOUS | Status: DC

## 2022-10-29 MED ORDER — SODIUM CHLORIDE 0.9 % IV BOLUS
500.0000 mL | Freq: Once | INTRAVENOUS | Status: AC
  Administered 2022-10-29: 500 mL via INTRAVENOUS

## 2022-10-29 MED ORDER — HEPARIN SODIUM (PORCINE) 5000 UNIT/ML IJ SOLN
5000.0000 [IU] | Freq: Three times a day (TID) | INTRAMUSCULAR | Status: DC
  Administered 2022-10-29 – 2022-10-30 (×2): 5000 [IU] via SUBCUTANEOUS
  Filled 2022-10-29 (×2): qty 1

## 2022-10-29 MED ORDER — METOPROLOL TARTRATE 5 MG/5ML IV SOLN
5.0000 mg | Freq: Three times a day (TID) | INTRAVENOUS | Status: DC | PRN
  Administered 2022-10-29: 5 mg via INTRAVENOUS
  Filled 2022-10-29: qty 5

## 2022-10-29 NOTE — Plan of Care (Signed)
Pt is alert and oriented. Pt is unable to keep thickening liquids down. Pt spits up everything he drinks, noted. Pt is unable to eat, noted. Swabs was used to moisture his mouth, which is sore and have sores, noted. Pt vital signs are stable. Pt had a medium brown semi liquid bowel movement. Call bell in reach. Pt brother is at the bedside.  Problem: Education: Goal: Knowledge of disease and its progression will improve Outcome: Not Progressing   Problem: Health Behavior/Discharge Planning: Goal: Ability to manage health-related needs will improve Outcome: Not Progressing   Problem: Clinical Measurements: Goal: Complications related to the disease process or treatment will be avoided or minimized Outcome: Not Progressing Goal: Dialysis access will remain free of complications Outcome: Not Progressing   Problem: Activity: Goal: Activity intolerance will improve Outcome: Not Progressing   Problem: Fluid Volume: Goal: Fluid volume balance will be maintained or improved Outcome: Not Progressing

## 2022-10-29 NOTE — NC FL2 (Signed)
[place] LEVEL OF CARE FORM     IDENTIFICATION  Patient Name: [name] Birthdate: 24-May-1956 Sex: male Admission Date (Current Location): 10/24/2022  [facility] and [place] Number:  Herbalist and Address:  [facility], [place] [address], [place], [place] [address]      Provider Number: [id number]  Attending Physician Name and Address:  Thurnell Lose, MD  Relative Name and Phone Number:       Current Level of Care: Hospital Recommended Level of Care: [place] Prior Approval Number:    Date Approved/Denied:   PASRR Number: [id number] system down-[id number]  Discharge Plan: SNF    Current Diagnoses: Patient Active Problem List   Diagnosis Date Noted   Non-ST elevation (NSTEMI) myocardial infarction ([place]) 10/28/2022   NSVT (nonsustained ventricular tachycardia) ([place]) 10/26/2022   Pressure injury of skin 10/26/2022   Nonrheumatic aortic valve insufficiency 10/26/2022   AKI (acute kidney injury) ([place]) 10/24/2022   Cigarette smoker 11/27/2020   Frequent falls 07/21/2019   Unintentional weight loss 07/01/2016   Anorexia 03/11/2016   Syncope 10/31/2015   Depression 04/19/2015   HIV disease ([place]) 04/18/2015   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   Essential hypertension 03/30/2015   Asthma, chronic 03/30/2015   Malignant neoplasm of bladder ([place]) 03/30/2015    Orientation RESPIRATION BLADDER Height & Weight     Self  Normal (needs some suctioning to clear throat) Incontinent, External catheter Weight: 106 lb 0.7 oz (48.1 kg) Height:  5\' 4"  (162.6 cm)  BEHAVIORAL SYMPTOMS/MOOD NEUROLOGICAL BOWEL NUTRITION STATUS      Incontinent Diet (See dc summary)  AMBULATORY STATUS COMMUNICATION OF NEEDS Skin   Extensive Assist Verbally PU Stage and Appropriate Care (Stage II on buttocks)                       Personal Care Assistance Level of Assistance  Bathing, Feeding, Dressing Bathing Assistance: Maximum  assistance Feeding assistance: Limited assistance Dressing Assistance: Limited assistance     Functional Limitations Info             SPECIAL CARE FACTORS FREQUENCY  PT (By licensed PT), OT (By licensed OT)     PT Frequency: 5x/week OT Frequency: 5x/week            Contractures Contractures Info: Not present    Additional Factors Info  Code Status, Allergies Code Status Info: DNR Allergies Info: Aspirin           Current Medications (10/29/2022):  This is the current hospital active medication list Current Facility-Administered Medications  Medication Dose Route Frequency Provider Last Rate Last Admin   (feeding supplement) PROSource Plus liquid 30 mL  30 mL Oral TID BM Kc, Ramesh, MD   30 mL at 10/27/22 0843   0.9 %  sodium chloride infusion   Intravenous Once Thurnell Lose, MD       acetaminophen (TYLENOL) tablet 650 mg  650 mg Oral Q6H PRN [name], MD       Or   acetaminophen (TYLENOL) suppository 650 mg  650 mg Rectal Q6H PRN [name], MD       albuterol (PROVENTIL) (2.5 MG/3ML) 0.083% nebulizer solution 3 mL  3 mL Inhalation Q6H PRN [name], MD       calcium carbonate (dosed in mg elemental calcium) suspension 500 mg of elemental calcium  500 mg of elemental calcium Oral Q6H PRN Gala Romney  L, MD       camphor-menthol (SARNA) lotion 1 Application  1 Application Topical [id number] PRN [name], MD       And   hydrOXYzine (ATARAX) tablet 25 mg  25 mg Oral Q8H PRN [name], MD       Darunavir-Cobicistat-Emtricitabine-Tenofovir Alafenamide (SYMTUZA) 800-150-200-10 MG TABS 1 tablet  1 tablet Oral Q breakfast [name], MD   1 tablet at 10/28/22 0830   docusate sodium (ENEMEEZ) enema 283 mg  1 enema Rectal PRN [name], MD       feeding supplement (BOOST / RESOURCE BREEZE) liquid 1 Container  1 Container Oral TID WC Kc, Ramesh, MD   1 Container at 10/28/22 1305   feeding supplement (NEPRO CARB STEADY)  liquid 237 mL  237 mL Oral TID PRN [name], MD       folic acid (FOLVITE) tablet 1 mg  1 mg Oral Daily Kc, Ramesh, MD   1 mg at 10/28/22 [id number]   food thickener (SIMPLYTHICK (NECTAR/LEVEL 2/MILDLY THICK)) 1 packet  1 packet Oral PRN Kc, Maren [place], MD       heparin injection 5,000 Units  5,000 Units Subcutaneous Q8H Thurnell Lose, MD   5,000 Units at 10/29/22 1417   ipratropium-albuterol (DUONEB) 0.5-2.5 (3) MG/3ML nebulizer solution 3 mL  3 mL Nebulization Q6H PRN Gala Romney L, MD       metoprolol tartrate (LOPRESSOR) injection 5 mg  5 mg Intravenous Q8H PRN Thurnell Lose, MD   5 mg at 10/29/22 1544   metoprolol tartrate (LOPRESSOR) tablet 25 mg  25 mg Oral BID Kc, Maren Beach, MD   25 mg at 10/27/22 [id number]   morphine (PF) 2 MG/ML injection 1 mg  1 mg Intravenous Q4H PRN Kc, Maren Beach, MD       multivitamin with minerals tablet 1 tablet  1 tablet Oral Daily Kc, Ramesh, MD   1 tablet at 10/28/22 0829   nitroGLYCERIN (NITROSTAT) SL tablet 0.4 mg  0.4 mg Sublingual Q5 min PRN Kc, Ramesh, MD   0.4 mg at 10/26/22 1153   ondansetron (ZOFRAN) tablet 4 mg  4 mg Oral Q6H PRN [name], MD       Or   ondansetron (ZOFRAN) injection 4 mg  4 mg Intravenous Q6H PRN Gala Romney L, MD   4 mg at 10/29/22 1109   rosuvastatin (CRESTOR) tablet 20 mg  20 mg Oral Daily Kc, Ramesh, MD   20 mg at 10/28/22 0829   sorbitol 70 % solution 30 mL  30 mL Oral PRN [name], MD       thiamine (VITAMIN B1) tablet 100 mg  100 mg Oral Daily Kc, Ramesh, MD   100 mg at 10/28/22 0829   zolpidem (AMBIEN) tablet 5 mg  5 mg Oral QHS PRN [name], MD   5 mg at 10/25/22 [id number]     Discharge Medications: Please see discharge summary for a list of discharge medications.  Relevant Imaging Results:  Relevant Lab Results:   Additional Information SSN: [place], [place]

## 2022-10-29 NOTE — TOC Progression Note (Signed)
Transition of Care [facility]) - Progression Note    Patient Details  Name: [name] MRN: [id number] Date of Birth: 02-13-1956  Transition of Care [facility]) CM/SW [place], LCSW Phone Number: 10/29/2022, 4:41 PM  Clinical Narrative:    CSW met with patient, brother [name], and other family members at bedside. [name] declined the need to step out of the room to discuss plans. CSW discussed SNF recommendation and patient stated he would want to return home at discharge. However, due to patient's confusion, would defer to family for assistance with decision making as they report it is not safe for patient to return home in his current state. CSW made them aware that referral will be sent out and will provide bed offers to [name].    Expected Discharge Plan: Skilled Nursing Facility Barriers to Discharge: Ship broker, Continued Medical Work up, SNF Pending bed offer  Expected Discharge Plan and Services In-house Referral: Clinical Social Work, Hospice / [place] Acute Care Choice: [place] Living arrangements for the past 2 months: Single Family Home                                       Social Determinants of Health (SDOH) Interventions SDOH Screenings   Food Insecurity: No Food Insecurity (09/25/2020)  Housing: Low Risk  (09/25/2020)  Transportation Needs: Unmet Transportation Needs (08/24/2020)  Alcohol Screen: Low Risk  (07/28/2019)  Depression (PHQ2-9): Low Risk  (04/23/2022)  Financial Resource Strain: Medium Risk (04/28/2019)  Physical Activity: Inactive (04/28/2019)  Social Connections: Socially Isolated (04/06/2020)  Stress: No Stress Concern Present (09/21/2019)  Tobacco Use: High Risk (04/23/2022)    Readmission Risk Interventions     No data to display

## 2022-10-29 NOTE — Progress Notes (Signed)
Speech Language Pathology Treatment: Dysphagia  Patient Details Name: [name] MRN: [id number] DOB: 07/22/1956 Today's Date: 10/29/2022 Time: 1500-1520 SLP Time Calculation (min) (ACUTE ONLY): 20 min  Assessment / Plan / Recommendation Clinical Impression  Patient seen by SLP for skilled treatment focused on dysphagia goals. Multiple family members in room however patient's brother is main caregiver at this time. Patient was awake and alert, had removed nasal cannula and when brother put back on, patient removed again. He was receptive to oral care but required cues to allow SLP to continue as he would shake head saying "enough" at times. SLP changed out patient's Yankauer suction and tubing and provided extensive oral suction to patient. He had copious amounts of thick, wet secretions in oral cavity and even after several minutes of suctioning, more remained as evidenced by patient's gurgled sounding voice and visual inspection of oral cavity (when patient would allow). He likely has same thick secretions throughout his pharynx. Patient was then receptive to ice chip trial. SLP provided total of three small ice chips, one at a time. Patient was able to move ice chips around in his mouth slightly and he would then indicate wanting suction or to wipe his mouth. He did not appear to initiate a swallow during any of the ice chip trials. SLP spoke with patient and his brother and recommended he have PRN small amounts of ice chips, even just to let melt in mouth, in hopes that it will help with his secretion management even if just a little. SLP will continue to follow patient.   HPI HPI: Pt is a 67 y.o. 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 Pt is generally alert and oriented, presenting generally lethargic with reduced intelligibility. Pt reports difficulty when swallowing liquids and solids. Pt is currently on a renal diet is a concern for malnutrition and reduced hydration., with a fluid restriction of 1200 mL. Chest CXR on 10/24/22 revealed Emphysematous changes in the lungs and no evidence of active pulmonary disease.      SLP Plan  Continue with current plan of care      Recommendations for follow up therapy are one component of a multi-disciplinary discharge planning process, led by the attending physician.  Recommendations may be updated based on patient status, additional functional criteria and insurance authorization.    Recommendations  Diet recommendations: Nectar-thick liquid Liquids provided via: Cup;Straw Medication Administration: Crushed with puree Supervision: Staff to assist with self feeding Compensations: Slow rate;Small sips/bites;Minimize environmental distractions;Clear throat intermittently Postural Changes and/or Swallow Maneuvers: Seated upright 90 degrees;Upright 30-60 min after meal                  Oral care BID   Frequent or constant Supervision/Assistance Dysphagia, oropharyngeal phase (R13.12)     Continue with current plan of care     Sonia Baller, MA, CCC-SLP Speech Therapy

## 2022-10-29 NOTE — Progress Notes (Signed)
PROGRESS NOTE Samrudh [name]  [id number] DOB: 1955/12/20 DOA: 10/11/2022 PCP: Allen Foy, NP  Brief Narrative/Hospital Course: 67 year old male with reported COPD, HIV, hypertension, bladder tumor alcohol use brought by EMS for altered mental status after a neighbor called for him as he was not acting like himself, smell of urine and a urine bottles everywhere and he has had urinary retention x 5 days.  Patient was diagnosed with severe malnutrition, cachexia, dehydration, AKI, aspiration pneumonia and was admitted to the hospital.  He was an extremely poor state of health and hygiene.   Subjective: Patient in bed sleeping, arousable, denies any headache or chest pain, states feels weak all over.  Assessment and Plan:   Severe AKI, Dehydration, Metabolic acidosis due to AKI: in a patient with history of HIV, alcohol abuse, severe malnutrition and cachexia, ongoing aspiration.  Bladder scan negative, renal ultrasound chronic medical renal disease without hydronephrosis.Creat improving with IV fluid > cut to 50 cc/hr bnp elevated this am cxr clear tho,hydration, monitor electrolytes, urine output. AKI is most likely prerenal. Avoid nephrotoxic medication,dose meds renally.   Recent Labs    12/31/21 1647 04/23/22 1534 10/06/2022 2016 10/25/22 0238 10/26/22 0144 10/27/22 0647 10/28/22 0511 10/29/22 0255  BUN 14 12 102* 100* 63* 35* 27* 23  CREATININE 1.06 1.01 5.78* 4.67* 2.18* 1.59* 1.63* 1.67*   NSTEMI, Elevated troponin with ST and T wave changes in anterior leads-NSTEMI, Tachycardia w/ narrow complex tachycardia/NSVT:  Seen by cardiology underwent echocardiogram showing low normal EF with moderate to severe AS, was given few days of amiodarone and heparin drip, not a candidate for invasive testing or procedures.  Currently chest pain-free.  Gentle medical treatment per cardiology.  Case discussed with cardiologist Dr. Virgina Jock.  HIV disease followed by ID last CD4 count stable  /viral load elevated as below.Continue home [place].  Per [name] his brother- patient's rest of the family-sisters  not aware of this diagnosis and only [name] is aware who has been helping him with his care.    Lab Results  Component Value Date   CD4TABS 405 02/18/2022   CD4TABS 553 12/31/2021   CD4TABS 443 04/02/2021   Lab Results  Component Value Date   HIV1RNAQUANT 109,000 (H) 02/18/2022     Alcohol abuse: Level less than 10, no withdrawals,watch for withdrawal symptoms and address accordingly, patient last drink was several days ago. Cont thiamine folate vitamins-monitor  ?Seizure disorder per his brother- not on AEDS. Monitor  Hypokalemia.  Replaced.    Poor historian/altered mental status with confusion likely acute metabolic encephalopathy: Continue supportive care gentle hydration  Asthma:not wheezing, not in exacerbation.  Depression: Mood is stable  Hypertension BP controlled on home amlodipine> will discontinue for now as placed him on metoprolol  Anemia likely from chronic disease HIV, initial hemoglobin high at 13 g likely hemoconcentrated.  His hemoglobin was in low 10 g 6 months ago- monitor hb  History of bladder cancer-reportedly patient was having hematuria on last follow-up with ID, question of recurrent bladder cancer, will need to monitor closely while on heparin.  Severe malnutrition BMI 16.9,dietitian consulted-augment aggressively status.Speech eval for swallowing.  Spitting during eating and taking meds:speech eval requested- cont on modified diet.  Goals of care: Had detailed discussion with patient's brother who is the primary decision maker bedside on 10/27/2022, he understands that his brother may pass away this hospitalization, he wants to continue gentle medical treatment, if any further decline he confirms that he will be pursuing full comfort measures at  that time.  DNR.  Pressure ulcer STAGE 2  left buttock POA see below Pressure Injury  10/25/22 Buttocks Left Stage 2 -  Partial thickness loss of dermis presenting as a shallow open injury with a red, pink wound bed without slough. (Active)  10/25/22 0100  Location: Buttocks  Location Orientation: Left  Staging: Stage 2 -  Partial thickness loss of dermis presenting as a shallow open injury with a red, pink wound bed without slough.  Wound Description (Comments):   Present on Admission: Yes  Dressing Type Foam - Lift dressing to assess site every shift 10/25/22 0111   DVT prophylaxis: Heparin Code Status:   Code Status: DNR Family Communication: Mike bedside on 10/27/2022, DNR, gentle medical treatment, if declines full comfort care.  Brother updated again bedside 10/28/2022, 10/29/2022 Patient status is: Inpatient because of AKI Level of care: MedSurg bed  Dispo: The patient is from: Home            Anticipated disposition: TBD Objective: Vitals last 24 hrs: Vitals:   10/29/22 0000 10/29/22 0400 10/29/22 0429 10/29/22 0745  BP: (!) 116/58 (!) 120/58 (!) 120/58 108/66  Pulse: 93 95 96 98  Resp: (!) 21 (!) 21 20 (!) 22  Temp:   99.1 F (37.3 C) (!) 97.3 F (36.3 C)  TempSrc:    Oral  SpO2: 97% 96% 98% (!) 82%  Weight:      Height:       Weight change:   Physical Examination:  Frail and cachectic middle-aged African-American male lying in hospital bed in no distress but appears extremely tired and fatigued, [place].AT,PERRAL Supple Neck, No JVD,   Symmetrical Chest wall movement, Good air movement bilaterally, bilateral coarse breath sounds RRR,No Gallops, Rubs or new Murmurs,  +ve B.Sounds, Abd Soft, No tenderness,   No Cyanosis, Clubbing or edema    Medications reviewed:  Scheduled Meds:  (feeding supplement) PROSource Plus  30 mL Oral TID BM   Darunavir-Cobicistat-Emtricitabine-Tenofovir Alafenamide  1 tablet Oral Q breakfast   feeding supplement  1 Container Oral TID WC   folic acid  1 mg Oral Daily   metoprolol tartrate  25 mg Oral BID   multivitamin  with minerals  1 tablet Oral Daily   rosuvastatin  20 mg Oral Daily   thiamine  100 mg Oral Daily   Continuous Infusions:  heparin 750 Units/hr (10/27/22 2318)   Diet Order             Diet clear liquid Room service appropriate? Yes; Fluid consistency: Nectar Thick; Fluid restriction: 1200 mL Fluid  Diet effective now                  Intake/Output Summary (Last 24 hours) at 10/29/2022 0902 Last data filed at 10/29/2022 0000 Gross per 24 hour  Intake --  Output 750 ml  Net -750 ml   Net IO Since Admission: -1,157.78 mL [10/29/22 0902]  Wt Readings from Last 3 Encounters:  10/28/22 48.1 kg  04/23/22 54.4 kg  02/18/22 54 kg    Data Reviewed: I have personally reviewed following labs and imaging studies  Recent Labs  Lab 10/29/2022 2016 10/09/2022 2021 10/25/22 0238 10/26/22 0144 10/27/22 0647 10/28/22 0511 10/29/22 0255  WBC 8.7  --  8.1 4.5 6.8 6.5 6.2  HGB 13.2   < > 10.4* 9.6* 9.9* 8.8* 9.7*  HCT 38.9*   < > 30.4* 28.5* 31.3* 26.7* 29.7*  PLT 234  --  186 151 135* 132* 145*  MCV 102.1*  --  100.3* 101.1* 105.4* 103.5* 102.4*  MCH 34.6*  --  34.3* 34.0 33.3 34.1* 33.4  MCHC 33.9  --  34.2 33.7 31.6 33.0 32.7  RDW 13.9  --  13.8 13.6 14.2 14.3 14.4  LYMPHSABS 1.0  --   --   --   --   --   --   MONOABS 0.6  --   --   --   --   --   --   EOSABS 0.0  --   --   --   --   --   --   BASOSABS 0.0  --   --   --   --   --   --    < > = values in this interval not displayed.    Recent Labs  Lab 10/06/2022 2016 10/06/2022 2021 10/25/22 0238 10/26/22 0144 10/26/22 0912 10/26/22 0913 10/27/22 0647 10/28/22 0511 10/29/22 0255  NA 134*   < > 136 143  --   --  145 143 145  K 4.8   < > 5.0 3.7  --   --  3.7 3.4* 3.7  CL 102  --  108 121*  --   --  123* 123* 119*  CO2 14*  --  17* 17*  --   --  18* 14* 21*  ANIONGAP 18*  --  11 5  --   --  4* 6 5  GLUCOSE 130*  --  112* 143*  --   --  124* 120* 98  BUN 102*  --  100* 63*  --   --  35* 27* 23  CREATININE 5.78*  --  4.67*  2.18*  --   --  1.59* 1.63* 1.67*  AST 63*  --   --   --   --   --   --   --   --   ALT 30  --   --   --   --   --   --   --   --   ALKPHOS 80  --   --   --   --   --   --   --   --   BILITOT 1.0  --   --   --   --   --   --   --   --   ALBUMIN 2.7*  --  2.0*  --   --   --   --   --   --   TSH  --   --   --   --  1.186  --   --   --   --   BNP  --   --   --   --   --  1,293.6* 853.6* 1,161.9* 976.7*  MG  --   --   --   --   --   --  2.3 2.2 1.9  CALCIUM 9.4  --  8.5* 7.9*  --   --  7.8* 7.4* 7.7*   < > = values in this interval not displayed.      Recent Labs  Lab 10/25/22 0238 10/26/22 0144 10/26/22 0912 10/26/22 0913 10/27/22 0647 10/28/22 0511 10/29/22 0255  TSH  --   --  1.186  --   --   --   --   BNP  --   --   --  1,293.6* 853.6* 1,161.9* 976.7*  MG  --   --   --   --  2.3 2.2 1.9  CALCIUM 8.5* 7.9*  --   --  7.8* 7.4* 7.7*      Antimicrobials: Anti-infectives (From admission, onward)    Start     Dose/Rate Route Frequency Ordered Stop   10/25/22 0845  Darunavir-Cobicistat-Emtricitabine-Tenofovir Alafenamide (SYMTUZA) 800-150-200-10 MG TABS 1 tablet        1 tablet Oral Daily with breakfast 10/08/2022 2310        Culture/Microbiology    Component Value Date/Time   SDES BLOOD RIGHT FOREARM 09/10/2016 0319   SPECREQUEST BOTTLES DRAWN AEROBIC AND ANAEROBIC 10CC EA 09/10/2016 0319   CULT NO GROWTH 5 DAYS 09/10/2016 0319   REPTSTATUS 09/15/2016 FINAL 09/10/2016 0319   Other culture-see note  Radiology Studies: DG Chest Port 1 View  Result Date: 10/29/2022 CLINICAL DATA:  Shortness of breath EXAM: PORTABLE CHEST 1 VIEW COMPARISON:  Three days ago FINDINGS: Large lung volumes with subjective lucency. Symmetric nipple shadows which have also been seen on prior. There is no edema, consolidation, effusion, or pneumothorax. Normal heart size and mediastinal contours. Extensive artifact from EKG leads. IMPRESSION: 1. No edema or focal pneumonia. 2. Presumed nipple shadows,  suggest two-view radiograph with nipple markers when able. Electronically Signed   By: Jorje Guild M.D.   On: 10/29/2022 06:52     LOS: 5 days   Signature  -    Lala Lund M.D on 10/29/2022 at 9:03 AM   -  To page go to [email or web address]

## 2022-10-29 NOTE — Progress Notes (Addendum)
Physical Therapy Treatment Patient Details Name: [name] MRN: [id number] DOB: 09-19-1955 Today's Date: 10/29/2022   History of Present Illness Pt is a 67 y/o M admitted to [facility] on 3/22 for AMS and failure to thrive. CT of head without acute hemorrhage or infarct, noted white matter changes and moderate parenchymal volume loss. Chest x-ray without active disease. PMHx: HIV, HTN, bladder and prostatic urethra cancer, HTN, alcohol abuse, asthma.    PT Comments    Pt limited during today's session by fatigue and pain. Pt's brother at bedside, supportive throughout session. Pt agreeable to BLE exercises but only tolerating a few before reporting increased pain. Encouraged pt to attempt sitting EOB but pt eventually declined. Pt pulled into long sitting with modAx2 for repositioning of pillows for comfort. Encouraged pt to move BUE and BLE as able to maintain strength. Acute PT will follow as appropriate, discharge recommendations remain the same.     Recommendations for follow up therapy are one component of a multi-disciplinary discharge planning process, led by the attending physician.  Recommendations may be updated based on patient status, additional functional criteria and insurance authorization.  Follow Up Recommendations  Can patient physically be transported by private vehicle: No    Assistance Recommended at Discharge Frequent or constant Supervision/Assistance  Patient can return home with the following Two people to help with walking and/or transfers;A lot of help with bathing/dressing/bathroom;Direct supervision/assist for medications management;Assistance with cooking/housework;Help with stairs or ramp for entrance;Assist for transportation   Equipment Recommendations  Other (comment) (defer to next level)    Recommendations for Other Services       Precautions / Restrictions Precautions Precautions: Fall Restrictions Weight Bearing Restrictions: No     Mobility  Bed  Mobility Overal bed mobility: Needs Assistance             General bed mobility comments: pulled into long sitting for repositioning with modA and 2HHA, declined any further mobility    Transfers                   General transfer comment: declined    Ambulation/Gait                   Stairs             Wheelchair Mobility    Modified Rankin (Stroke Patients Only)       Balance Overall balance assessment: Needs assistance                                          Cognition Arousal/Alertness: Awake/alert Behavior During Therapy: Flat affect Overall Cognitive Status: Impaired/Different from baseline Area of Impairment: Memory, Following commands, Problem solving, Safety/judgement                     Memory: Decreased short-term memory Following Commands: Follows one step commands with increased time Safety/Judgement: Decreased awareness of safety, Decreased awareness of deficits   Problem Solving: Slow processing, Decreased initiation, Requires verbal cues, Requires tactile cues General Comments: Brother at bedside, reports yesterday pt was lethargic, improved today, but increased time and verbal and tactile cues required for exercises today        Exercises General Exercises - Lower Extremity Ankle Circles/Pumps: AAROM, Both, 10 reps, Supine Short Arc Quad: AAROM, Both, 5 reps, Supine    General Comments General comments (skin integrity, edema, etc.): HR  elevated to 100-110s in bed, on 2L O2 [place]      Pertinent Vitals/Pain Pain Assessment Pain Assessment: Faces Faces Pain Scale: Hurts little more Pain Location: generalized Pain Descriptors / Indicators: Grimacing, Guarding Pain Intervention(s): Limited activity within patient's tolerance, Monitored during session, Repositioned    Home Living                          Prior Function            PT Goals (current goals can now be found in the  care plan section) Acute Rehab PT Goals Patient Stated Goal: get better PT Goal Formulation: With patient/family Time For Goal Achievement: 11/10/22 Potential to Achieve Goals: Poor Progress towards PT goals: Not progressing toward goals - comment (limited by pain and fatigue)    Frequency    Min 2X/week      PT Plan Current plan remains appropriate    Co-evaluation              AM-PAC PT "6 Clicks" Mobility   Outcome Measure  Help needed turning from your back to your side while in a flat bed without using bedrails?: A Lot Help needed moving from lying on your back to sitting on the side of a flat bed without using bedrails?: A Lot Help needed moving to and from a bed to a chair (including a wheelchair)?: Total Help needed standing up from a chair using your arms (e.g., wheelchair or bedside chair)?: Total Help needed to walk in hospital room?: Total Help needed climbing 3-5 steps with a railing? : Total 6 Click Score: 8    End of Session Equipment Utilized During Treatment: Oxygen Activity Tolerance: Patient limited by lethargy;Patient limited by pain Patient left: in bed;with call bell/phone within reach;with bed alarm set Nurse Communication: Mobility status PT Visit Diagnosis: Adult, failure to thrive (R62.7);Difficulty in walking, not elsewhere classified (R26.2);Muscle weakness (generalized) (M62.81)     Time: :[id number] PT Time Calculation (min) (ACUTE ONLY): 9 min  Charges:  $Therapeutic Exercise: 8-22 mins                     Charlynne Cousins, PT DPT Acute Rehabilitation Services Office [phone]    Luvenia Heller 10/29/2022, 1:43 PM

## 2022-10-29 NOTE — Progress Notes (Addendum)
Daily Progress Note   Patient Name: [name]       Date: 10/29/2022 DOB: 1955-11-09  Age: 67 y.o. MRN#: [id number] Attending Physician: Thurnell Lose, MD Primary Care Physician: Fenton Foy, NP Admit Date: 10/24/2022 Length of Stay: 5 days  Reason for Consultation/Follow-up: Establishing goals of care  HPI/Patient Profile:  67 y.o. male  with past medical history of COPD, HIV, bladder cancer s/p TURP and chemo, HTN, and alcohol use disorder who presented to [facility] ED on 10/24/2022 with altered mental status.  He was found by neighbor not acting like himself, smelling of urine, and with urine bottles everywhere.  He was admitted with severe dehydration, failure to thrive, and AKI with creatinine 5.78.   Palliative Medicine was consulted for code status.  Subjective:   Subjective: Chart Reviewed. Updates received. Patient Assessed. Created space and opportunity for patient  and family to explore thoughts and feelings regarding current medical situation.  Today's Discussion: Today saw the patient at the bedside.  When he tried to speak he was difficult to understand because of gurgling with noted excessive secretions in his throat.  Patient states that the doctor stated that they would suction him.  I notified the nurse of gurgling and requested suctioning.  Patient also complains of some nausea I requested a dose of Zofran for the patient.  When I entered he was wearing his nasal cannula on his forehead which I replaced.  He is currently on 2 L of oxygen and satting mid to upper 90s.  When speaking with the patient and family we confirmed DNR, continue to treat the treatable, consider comfort care if the patient has further decline.  He is at high risk for decompensation.  I provided emotional and general support through therapeutic listening, empathy, sharing of stories, and other techniques. I answered all questions and addressed all concerns to the best of my ability.  Review of  Systems  Respiratory:  Positive for cough.   Gastrointestinal:  Negative for abdominal pain, nausea and vomiting.    Objective:   Vital Signs:  BP 108/66 (BP Location: Left Arm)   Pulse 98   Temp (!) 97.3 F (36.3 C) (Oral)   Resp (!) 22   Ht 5\' 4"  (1.626 m)   Wt 48.1 kg   SpO2 (!) 82%   BMI 18.20 kg/m   Physical Exam: Physical Exam Vitals and nursing note reviewed.  HENT:     Head: Normocephalic and atraumatic.  Cardiovascular:     Rate and Rhythm: Normal rate.  Pulmonary:     Effort: Pulmonary effort is normal. No respiratory distress.     Comments: Noted gurgling with speech Skin:    General: Skin is warm and dry.  Neurological:     Mental Status: He is alert.  Psychiatric:        Mood and Affect: Mood normal.        Behavior: Behavior normal.     Palliative Assessment/Data: 20-30%    Existing Vynca/ACP Documentation: None  Assessment & Plan:   Impression: Present on Admission:  Essential hypertension  Malignant neoplasm of bladder (HCC)  Unintentional weight loss  HIV disease (HCC)  Asthma, chronic  Depression  AKI (acute kidney injury) ([place])  Pressure injury of skin  SUMMARY OF RECOMMENDATIONS   DNR Non-aggressive treatment (medical management) Increase monitoring and frequency of suctioning Please provide dose of Zofran for patient (scheduled every 4 hours as needed) Consider comfort care discussion if noted further decline PMT will  continue to follow  Symptom Management:  Per primary team PMT is available to assist as needed  Code Status: DNR  Prognosis: Unable to determine  Discharge Planning: To Be Determined  Discussed with: Patient, patient's family, medical team, nursing team  Thank you for allowing [place] to participate in the care of Zaivion Gelinas PMT will continue to support holistically.  Billing based on MDM: High  Problems Addressed: One acute or chronic illness or injury that poses a threat to life or bodily  function  Amount and/or Complexity of Data: Category 3:Discussion of management or test interpretation with external physician/other qualified health care professional/appropriate source (not separately reported)  Risks: N/A    Walden Field, NP Palliative Medicine Team  Team Phone # [phone] (Nights/Weekends)  04/02/2021, 8:17 AM

## 2022-10-30 ENCOUNTER — Inpatient Hospital Stay (HOSPITAL_COMMUNITY): Payer: 59

## 2022-10-30 DIAGNOSIS — Z66 Do not resuscitate: Secondary | ICD-10-CM | POA: Diagnosis not present

## 2022-10-30 DIAGNOSIS — Z7189 Other specified counseling: Secondary | ICD-10-CM | POA: Diagnosis not present

## 2022-10-30 DIAGNOSIS — Z515 Encounter for palliative care: Secondary | ICD-10-CM | POA: Diagnosis not present

## 2022-10-30 DIAGNOSIS — E860 Dehydration: Secondary | ICD-10-CM | POA: Diagnosis not present

## 2022-10-30 LAB — CBC
HCT: 31.7 % — ABNORMAL LOW (ref 39.0–52.0)
Hemoglobin: 10.1 g/dL — ABNORMAL LOW (ref 13.0–17.0)
MCH: 33.3 pg (ref 26.0–34.0)
MCHC: 31.9 g/dL (ref 30.0–36.0)
MCV: 104.6 fL — ABNORMAL HIGH (ref 80.0–100.0)
Platelets: 140 10*3/uL — ABNORMAL LOW (ref 150–400)
RBC: 3.03 MIL/uL — ABNORMAL LOW (ref 4.22–5.81)
RDW: 14.6 % (ref 11.5–15.5)
WBC: 6.5 10*3/uL (ref 4.0–10.5)
nRBC: 0 % (ref 0.0–0.2)

## 2022-10-30 LAB — BASIC METABOLIC PANEL
Anion gap: 7 (ref 5–15)
BUN: 28 mg/dL — ABNORMAL HIGH (ref 8–23)
CO2: 16 mmol/L — ABNORMAL LOW (ref 22–32)
Calcium: 7.6 mg/dL — ABNORMAL LOW (ref 8.9–10.3)
Chloride: 123 mmol/L — ABNORMAL HIGH (ref 98–111)
Creatinine, Ser: 2.23 mg/dL — ABNORMAL HIGH (ref 0.61–1.24)
GFR, Estimated: 31 mL/min — ABNORMAL LOW (ref >60)
Glucose, Bld: 80 mg/dL (ref 70–99)
Potassium: 4.1 mmol/L (ref 3.5–5.1)
Sodium: 146 mmol/L — ABNORMAL HIGH (ref 135–145)

## 2022-10-30 LAB — BRAIN NATRIURETIC PEPTIDE: B Natriuretic Peptide: 814.4 pg/mL — ABNORMAL HIGH (ref 0.0–100.0)

## 2022-10-30 LAB — MAGNESIUM: Magnesium: 2.2 mg/dL (ref 1.7–2.4)

## 2022-10-30 MED ORDER — GLYCOPYRROLATE 0.2 MG/ML IJ SOLN
0.2000 mg | INTRAMUSCULAR | Status: DC | PRN
  Administered 2022-10-30: 0.2 mg via INTRAVENOUS

## 2022-10-30 MED ORDER — LORAZEPAM 1 MG PO TABS: 1.0000 mg | ORAL_TABLET | ORAL | Status: DC | PRN

## 2022-10-30 MED ORDER — HYDROMORPHONE HCL 1 MG/ML IJ SOLN
0.5000 mg | INTRAMUSCULAR | Status: DC | PRN
  Administered 2022-10-30 (×2): 0.5 mg via INTRAVENOUS
  Filled 2022-10-30 (×2): qty 0.5

## 2022-10-30 MED ORDER — BIOTENE DRY MOUTH MT LIQD: 15.0000 mL | OROMUCOSAL | Status: DC | PRN

## 2022-10-30 MED ORDER — GLYCOPYRROLATE 1 MG PO TABS: 1.0000 mg | ORAL_TABLET | ORAL | Status: DC | PRN

## 2022-10-30 MED ORDER — LORAZEPAM 2 MG/ML IJ SOLN
1.0000 mg | INTRAMUSCULAR | Status: DC | PRN
  Administered 2022-10-31: 1 mg via INTRAVENOUS
  Filled 2022-10-30: qty 1

## 2022-10-30 MED ORDER — POLYVINYL ALCOHOL 1.4 % OP SOLN: 1.0000 [drp] | Freq: Four times a day (QID) | OPHTHALMIC | Status: DC | PRN

## 2022-10-30 MED ORDER — SODIUM CHLORIDE 0.9 % IV SOLN: 12.5000 mg | Freq: Four times a day (QID) | INTRAVENOUS | Status: DC | PRN

## 2022-10-30 MED ORDER — LORAZEPAM 2 MG/ML PO CONC: 1.0000 mg | ORAL | Status: DC | PRN

## 2022-10-30 MED ORDER — GLYCOPYRROLATE 0.2 MG/ML IJ SOLN
0.2000 mg | INTRAMUSCULAR | Status: DC | PRN
  Filled 2022-10-30: qty 1

## 2022-10-30 NOTE — Progress Notes (Signed)
SLP Cancellation Note  Patient Details Name: [name] MRN: [id number] DOB: 09-Jun-1956   Cancelled treatment:       Reason Eval/Treat Not Completed: Other (comment) (Patient has transitioned to full comfort measures at this time per MD note. SLP to s/o at this time.)  Sonia Baller, MA, CCC-SLP Speech Therapy

## 2022-10-30 NOTE — TOC Progression Note (Signed)
Transition of Care [facility]) - Progression Note    Patient Details  Name: [name] MRN: [id number] Date of Birth: 07-Oct-1955  Transition of Care [facility]) CM/SW [place], LCSW Phone Number: 10/30/2022, 5:05 PM  Clinical Narrative:    CSW sent referral to [facility] for review for [facility].    Expected Discharge Plan: Skilled Nursing Facility Barriers to Discharge: Ship broker, Continued Medical Work up, SNF Pending bed offer  Expected Discharge Plan and Services In-house Referral: Clinical Social Work, Hospice / [place] Acute Care Choice: [place] Living arrangements for the past 2 months: Single Family Home                                       Social Determinants of Health (SDOH) Interventions SDOH Screenings   Food Insecurity: No Food Insecurity (09/25/2020)  Housing: Low Risk  (09/25/2020)  Transportation Needs: Unmet Transportation Needs (08/24/2020)  Alcohol Screen: Low Risk  (07/28/2019)  Depression (PHQ2-9): Low Risk  (04/23/2022)  Financial Resource Strain: Medium Risk (04/28/2019)  Physical Activity: Inactive (04/28/2019)  Social Connections: Socially Isolated (04/06/2020)  Stress: No Stress Concern Present (09/21/2019)  Tobacco Use: High Risk (04/23/2022)    Readmission Risk Interventions     No data to display

## 2022-10-30 NOTE — TOC Progression Note (Signed)
Transition of Care [facility]) - Progression Note    Patient Details  Name: [name] MRN: [id number] Date of Birth: [birth year]  [facility] CM/SW Contact  Levonne Lapping, RN Phone Number: 10/30/2022, 4:03 PM  Clinical Narrative:     CM met with Patient's Brother, Daughters and extended Family to discuss possible Hospice Home placement. Brothers and Daughters all agree that they would like their Father/Brother to go to a [employer].  They feel he will be more comfortable and away from the hospital environment. They verbalized a preference for [facility] on !6th.  CSW made aware and will begin referral process.  Family updated     Expected Discharge Plan: Skilled Nursing Facility Barriers to Discharge: Ship broker, Continued Medical Work up, SNF Pending bed offer  Expected Discharge Plan and Services In-house Referral: Clinical Social Work, Hospice / [place] Acute Care Choice: [place] Living arrangements for the past 2 months: Single Family Home                                       Social Determinants of Health (SDOH) Interventions SDOH Screenings   Food Insecurity: No Food Insecurity (09/25/2020)  Housing: Low Risk  (09/25/2020)  Transportation Needs: Unmet Transportation Needs (08/24/2020)  Alcohol Screen: Low Risk  (07/28/2019)  Depression (PHQ2-9): Low Risk  (04/23/2022)  Financial Resource Strain: Medium Risk (04/28/2019)  Physical Activity: Inactive (04/28/2019)  Social Connections: Socially Isolated (04/06/2020)  Stress: No Stress Concern Present (09/21/2019)  Tobacco Use: High Risk (04/23/2022)    Readmission Risk Interventions     No data to display

## 2022-10-30 NOTE — Progress Notes (Signed)
Nutrition Brief Note  Chart reviewed. Per MD note, pt now transitioning to comfort care. Pt with CLD ordered, allow pt to drink as desires.   No further nutrition interventions planned at this time. Please re-consult as needed.   Hermina Barters RD, LDN Clinical Dietitian See Shea Evans for contact information.

## 2022-10-30 NOTE — Progress Notes (Signed)
PROGRESS NOTE [name]  [id number] DOB: Dec 31, 1955 DOA: 10/25/2022 PCP: Fenton Foy, NP  Brief Narrative/Hospital Course: 67 year old male with reported COPD, HIV, hypertension, bladder tumor alcohol use brought by EMS for altered mental status after a neighbor called for him as he was not acting like himself, smell of urine and a urine bottles everywhere and he has had urinary retention x 5 days.  Patient was diagnosed with severe malnutrition, cachexia, dehydration, AKI, aspiration pneumonia and was admitted to the hospital.  He was an extremely poor state of health and hygiene.   Subjective: Patient in bed sleeping, arousable, denies any headache or chest pain, states feels weak all over.  Assessment and Plan:   Had detailed discussion with patient and patient's primary decision maker brother [name] bedside on multiple occasions, on 10/30/2018 for brother [name] suggested that he thinks patient is continuously suffering and will be making any progress, he expresses wishes that we transition to full comfort measures.  I agree with this plan.  He will be transition to full comfort measures.  All noncovered medications will be stopped on 10/30/2022.  If he survives more than few days we will look into residential hospice placement.      Now on full comfort measures, all known comfort medications stopped.  Other problems which were actively addressed earlier this admission are below.     Severe AKI, Dehydration, Metabolic acidosis due to AKI: in a patient with history of HIV, alcohol abuse, severe malnutrition and cachexia, ongoing aspiration.  Bladder scan negative, renal ultrasound chronic medical renal disease without hydronephrosis.Creat improving with IV fluid > cut to 50 cc/hr bnp elevated this am cxr clear tho,hydration, monitor electrolytes, urine output. AKI is most likely prerenal. Avoid nephrotoxic medication,dose meds renally.   Recent Labs    12/31/21 1647  04/23/22 1534 10/22/2022 2016 10/25/22 0238 10/26/22 0144 10/27/22 0647 10/28/22 0511 10/29/22 0255 10/30/22 0419  BUN 14 12 102* 100* 63* 35* 27* 23 28*  CREATININE 1.06 1.01 5.78* 4.67* 2.18* 1.59* 1.63* 1.67* 2.23*   NSTEMI, Elevated troponin with ST and T wave changes in anterior leads-NSTEMI, Tachycardia w/ narrow complex tachycardia/NSVT:  Seen by cardiology underwent echocardiogram showing low normal EF with moderate to severe AS, was given few days of amiodarone and heparin drip, not a candidate for invasive testing or procedures.  Currently chest pain-free.  Gentle medical treatment per cardiology.  Case discussed with cardiologist Dr. Virgina Jock.  HIV disease followed by ID last CD4 count stable /viral load elevated as below.Continue home [place].  Per [name] his brother- patient's rest of the family-sisters  not aware of this diagnosis and only [name] is aware who has been helping him with his care.    Lab Results  Component Value Date   CD4TABS 405 02/18/2022   CD4TABS 553 12/31/2021   CD4TABS 443 04/02/2021   Lab Results  Component Value Date   HIV1RNAQUANT 109,000 (H) 02/18/2022     Alcohol abuse: Level less than 10, no withdrawals,watch for withdrawal symptoms and address accordingly, patient last drink was several days ago. Cont thiamine folate vitamins-monitor  ?Seizure disorder per his brother- not on AEDS. Monitor  Hypokalemia.  Replaced.    Poor historian/altered mental status with confusion likely acute metabolic encephalopathy: Continue supportive care gentle hydration  Asthma:not wheezing, not in exacerbation.  Depression: Mood is stable  Hypertension BP controlled on home amlodipine> will discontinue for now as placed him on metoprolol  Anemia likely from chronic disease HIV, initial hemoglobin high at  13 g likely hemoconcentrated.  His hemoglobin was in low 10 g 6 months ago- monitor hb  History of bladder cancer-reportedly patient was having  hematuria on last follow-up with ID, question of recurrent bladder cancer, will need to monitor closely while on heparin.  Severe malnutrition BMI 16.9,dietitian consulted-augment aggressively status.Speech eval for swallowing.  Spitting during eating and taking meds:speech eval requested- cont on modified diet.  Goals of care: Had detailed discussion with patient's brother who is the primary decision maker bedside on 10/27/2022, he understands that his brother may pass away this hospitalization, he wants to continue gentle medical treatment, if any further decline he confirms that he will be pursuing full comfort measures at that time.  DNR.  Pressure ulcer STAGE 2  left buttock POA see below Pressure Injury 10/25/22 Buttocks Left Stage 2 -  Partial thickness loss of dermis presenting as a shallow open injury with a red, pink wound bed without slough. (Active)  10/25/22 0100  Location: Buttocks  Location Orientation: Left  Staging: Stage 2 -  Partial thickness loss of dermis presenting as a shallow open injury with a red, pink wound bed without slough.  Wound Description (Comments):   Present on Admission: Yes  Dressing Type Foam - Lift dressing to assess site every shift 10/25/22 0111   DVT prophylaxis: Heparin Code Status:   Code Status: DNR Family Communication: Mike bedside on 10/27/2022, DNR, gentle medical treatment, if declines full comfort care.  Brother updated again bedside 10/28/2022, 10/29/2022 Patient status is: Inpatient because of AKI Level of care: MedSurg bed  Dispo: The patient is from: Home            Anticipated disposition: TBD Objective: Vitals last 24 hrs: Vitals:   10/29/22 1131 10/29/22 1600 10/29/22 2000 10/30/22 0740  BP: 113/64 115/65 (!) 111/57 124/67  Pulse: (!) 190 (!) 198 90 (!) 111  Resp: (!) 21 (!) 22 19 20   Temp: 97.7 F (36.5 C) 97.8 F (36.6 C) 98 F (36.7 C) 98.2 F (36.8 C)  TempSrc: Oral Oral Oral Axillary  SpO2: 100% 100% 100% 97%  Weight:       Height:       Weight change:   Physical Examination:  Frail and cachectic middle-aged African-American male lying in hospital bed in no distress but appears extremely tired and fatigued, [place].AT,PERRAL Supple Neck, No JVD,   Symmetrical Chest wall movement, Good air movement bilaterally, bilateral coarse breath sounds RRR,No Gallops, Rubs or new Murmurs,  +ve B.Sounds, Abd Soft, No tenderness,   No Cyanosis, Clubbing or edema    Medications reviewed:  Scheduled Meds:   Continuous Infusions:  chlorproMAZINE (THORAZINE) 12.5 mg in sodium chloride 0.9 % 25 mL IVPB     Diet Order             Diet clear liquid Room service appropriate? Yes; Fluid consistency: Nectar Thick; Fluid restriction: 1200 mL Fluid  Diet effective now                  Intake/Output Summary (Last 24 hours) at 10/30/2022 0840 Last data filed at 10/30/2022 0754 Gross per 24 hour  Intake --  Output 650 ml  Net -650 ml   Net IO Since Admission: -1,807.78 mL [10/30/22 0840]  Wt Readings from Last 3 Encounters:  10/28/22 48.1 kg  04/23/22 54.4 kg  02/18/22 54 kg    Data Reviewed: I have personally reviewed following labs and imaging studies  Recent Labs  Lab 10/10/2022 2016 10/08/2022  2021 10/26/22 0144 10/27/22 0647 10/28/22 0511 10/29/22 0255 10/30/22 0419  WBC 8.7   < > 4.5 6.8 6.5 6.2 6.5  HGB 13.2   < > 9.6* 9.9* 8.8* 9.7* 10.1*  HCT 38.9*   < > 28.5* 31.3* 26.7* 29.7* 31.7*  PLT 234   < > 151 135* 132* 145* 140*  MCV 102.1*   < > 101.1* 105.4* 103.5* 102.4* 104.6*  MCH 34.6*   < > 34.0 33.3 34.1* 33.4 33.3  MCHC 33.9   < > 33.7 31.6 33.0 32.7 31.9  RDW 13.9   < > 13.6 14.2 14.3 14.4 14.6  LYMPHSABS 1.0  --   --   --   --   --   --   MONOABS 0.6  --   --   --   --   --   --   EOSABS 0.0  --   --   --   --   --   --   BASOSABS 0.0  --   --   --   --   --   --    < > = values in this interval not displayed.    Recent Labs  Lab 10/07/2022 2016 10/14/2022 2021 10/25/22 0238  10/26/22 0144 10/26/22 0912 10/26/22 0913 10/27/22 0647 10/28/22 0511 10/29/22 0255 10/30/22 0419  NA 134*   < > 136 143  --   --  145 143 145 146*  K 4.8   < > 5.0 3.7  --   --  3.7 3.4* 3.7 4.1  CL 102  --  108 121*  --   --  123* 123* 119* 123*  CO2 14*  --  17* 17*  --   --  18* 14* 21* 16*  ANIONGAP 18*  --  11 5  --   --  4* 6 5 7   GLUCOSE 130*  --  112* 143*  --   --  124* 120* 98 80  BUN 102*  --  100* 63*  --   --  35* 27* 23 28*  CREATININE 5.78*  --  4.67* 2.18*  --   --  1.59* 1.63* 1.67* 2.23*  AST 63*  --   --   --   --   --   --   --   --   --   ALT 30  --   --   --   --   --   --   --   --   --   ALKPHOS 80  --   --   --   --   --   --   --   --   --   BILITOT 1.0  --   --   --   --   --   --   --   --   --   ALBUMIN 2.7*  --  2.0*  --   --   --   --   --   --   --   TSH  --   --   --   --  1.186  --   --   --   --   --   BNP  --   --   --   --   --  1,293.6* 853.6* 1,161.9* 976.7* 814.4*  MG  --   --   --   --   --   --  2.3 2.2 1.9 2.2  CALCIUM 9.4  --  8.5* 7.9*  --   --  7.8* 7.4* 7.7* 7.6*   < > = values in this interval not displayed.      Recent Labs  Lab 10/26/22 0144 10/26/22 0912 10/26/22 0913 10/27/22 0647 10/28/22 0511 10/29/22 0255 10/30/22 0419  TSH  --  1.186  --   --   --   --   --   BNP  --   --  1,293.6* 853.6* 1,161.9* 976.7* 814.4*  MG  --   --   --  2.3 2.2 1.9 2.2  CALCIUM 7.9*  --   --  7.8* 7.4* 7.7* 7.6*      Antimicrobials: Anti-infectives (From admission, onward)    Start     Dose/Rate Route Frequency Ordered Stop   10/25/22 0845  Darunavir-Cobicistat-Emtricitabine-Tenofovir Alafenamide (SYMTUZA) 800-150-200-10 MG TABS 1 tablet  Status:  Discontinued        1 tablet Oral Daily with breakfast 10/30/2022 2310 10/30/22 0837      Culture/Microbiology    Component Value Date/Time   SDES BLOOD RIGHT FOREARM 09/10/2016 0319   SPECREQUEST BOTTLES DRAWN AEROBIC AND ANAEROBIC 10CC EA 09/10/2016 0319   CULT NO GROWTH 5 DAYS  09/10/2016 0319   REPTSTATUS 09/15/2016 FINAL 09/10/2016 0319   Other culture-see note  Radiology Studies: DG Chest Port 1 View  Result Date: 10/30/2022 CLINICAL DATA:  Shortness of breath. EXAM: PORTABLE CHEST 1 VIEW COMPARISON:  10/29/2022 FINDINGS: The lungs are clear without focal pneumonia, edema, pneumothorax or pleural effusion. The cardiopericardial silhouette is within normal limits for size. Healed posterior right tenth rib fracture. Single nodular densities over each lower lung seen on the previous study are not as evident, but do persist. Telemetry pad overlies the left upper chest . IMPRESSION: 1. No acute cardiopulmonary findings. 2. Subtle single nodular density over each lung base as on the recent comparison study. Presumed nipple shadows, consider repeat frontal radiograph with nipple markers when able. Electronically Signed   By: Misty Stanley M.D.   On: 10/30/2022 06:24   DG Chest Port 1 View  Result Date: 10/29/2022 CLINICAL DATA:  Shortness of breath EXAM: PORTABLE CHEST 1 VIEW COMPARISON:  Three days ago FINDINGS: Large lung volumes with subjective lucency. Symmetric nipple shadows which have also been seen on prior. There is no edema, consolidation, effusion, or pneumothorax. Normal heart size and mediastinal contours. Extensive artifact from EKG leads. IMPRESSION: 1. No edema or focal pneumonia. 2. Presumed nipple shadows, suggest two-view radiograph with nipple markers when able. Electronically Signed   By: Jorje Guild M.D.   On: 10/29/2022 06:52     LOS: 6 days   Signature  -    Lala Lund M.D on 10/30/2022 at 8:40 AM   -  To page go to [email or web address]

## 2022-10-31 DIAGNOSIS — E860 Dehydration: Secondary | ICD-10-CM | POA: Diagnosis not present

## 2022-10-31 DIAGNOSIS — Z66 Do not resuscitate: Secondary | ICD-10-CM | POA: Diagnosis not present

## 2022-10-31 DIAGNOSIS — N179 Acute kidney failure, unspecified: Secondary | ICD-10-CM | POA: Diagnosis not present

## 2022-10-31 DIAGNOSIS — R627 Adult failure to thrive: Secondary | ICD-10-CM | POA: Diagnosis not present

## 2022-10-31 DIAGNOSIS — Z515 Encounter for palliative care: Secondary | ICD-10-CM | POA: Diagnosis not present

## 2022-10-31 DIAGNOSIS — Z7189 Other specified counseling: Secondary | ICD-10-CM | POA: Diagnosis not present

## 2022-10-31 MED ORDER — HYDROMORPHONE HCL 1 MG/ML IJ SOLN: 2.0000 mg | INTRAMUSCULAR | Status: DC | PRN

## 2022-10-31 MED ORDER — HYDROMORPHONE HCL 1 MG/ML IJ SOLN
INTRAMUSCULAR | Status: AC
  Administered 2022-10-31: 2 mg via INTRAVENOUS
  Filled 2022-10-31: qty 2

## 2022-11-03 NOTE — Progress Notes (Signed)
2022/11/06 IMM Letter respectfully not given, pt under Comfort Measures.

## 2022-11-03 NOTE — Significant Event (Signed)
Notified by Dr Candiss Norse that the patient is gasping for air, and he emergently ordered Ativan and dilaudid for comfort and ease.  Medications given by primary nurse, pt family is at bedside and and verbalizes understanding of the comfort care measures taken at this time. Respirations have decreased, patient is resting.

## 2022-11-03 NOTE — Progress Notes (Signed)
Palliative Medicine Progress Note   Patient Name: [name]       Date: 2022/11/12 DOB: [birth year]  Age: 67 y.o. MRN#: [id number] Attending Physician: Thurnell Lose, MD Primary Care Physician: Fenton Foy, NP Admit Date: 10/26/2022  Reason for Consultation/Follow-up: end of life care  HPI/Patient Profile: 67 y.o. male  with past medical history of COPD, HIV, bladder cancer s/p TURP and chemo, HTN, and alcohol use disorder who presented to [facility] ED on 10/15/2022 with altered mental status.  He was found by neighbor not acting like himself, smelling of urine, and with urine bottles everywhere.  He was admitted with severe dehydration, failure to thrive, and AKI with creatinine 5.78.   Palliative Medicine was initially consulted for code status.  Subjective: Chart reviewed. Note patient acutely declined and was transitioned to comfort care yesterday 3/28.  Bedside visit. Patient appears comfortable. He is unresponsive to voice and light touch. No non-verbal signs of pain or discomfort noted. Respirations are even and unlabored. No excessive respiratory secretions noted. Family present at bedside. Education and counseling provided on expectations at EOL. Emotional support provided.     Objective:  Physical Exam Vitals reviewed.  Constitutional:      General: He is not in acute distress.    Appearance: He is ill-appearing.  Pulmonary:     Effort: Pulmonary effort is normal.  Neurological:     Mental Status: He is unresponsive.             Vital Signs: BP (!) 141/62 (BP Location: Left Arm)   Pulse 98   Temp 98 F (36.7 C) (Oral)   Resp 16   Ht 5\' 4"  (1.626 m)   Wt 48.1 kg   SpO2 98%   BMI 18.20 kg/m  SpO2: SpO2: 98 % O2 Device: O2 Device: Room Air   Palliative Medicine  Assessment & Plan   Assessment: Principal Problem:   AKI (acute kidney injury) ([place]) Active Problems:   Essential hypertension   Asthma, chronic   Malignant neoplasm of bladder (HCC)   HIV disease (HCC)   Depression   Unintentional weight loss   NSVT (nonsustained ventricular tachycardia) (HCC)   Pressure injury of skin   Nonrheumatic aortic valve insufficiency   Non-ST elevation (NSTEMI) myocardial infarction [facility])    Recommendations/Plan: Continue full comfort measures When  oxygen down after remaining family arrives this evening PMT will continue to support as needed Transfer to [facility] has been cancelled  Code Status: DNR  Symptom Management:  Dilaudid prn for pain or dyspnea Lorazepam (ATIVAN) prn for anxiety Haloperidol (HALDOL) prn for agitation  Glycopyrrolate (ROBINUL) for excessive secretions Ondansetron (ZOFRAN) prn for nausea Polyvinyl alcohol (LIQUIFILM TEARS) prn for dry eyes Antiseptic oral rinse (BIOTENE) prn for dry mouth   Prognosis:  Hours - Days    Thank you for allowing the Palliative Medicine Team to assist in the care of this patient.   MDM - moderate   Lavena Bullion, NP   Please contact Palliative Medicine Team phone at [phone] for questions and concerns.  For individual providers, please see AMION.

## 2022-11-03 NOTE — Care Management Important Message (Signed)
Important Message  Patient Details  Name: [name] MRN: [id number] Date of Birth: 04/20/1956   Medicare Important Message Given:  No     Hannah Beat November 21, 2022, 1:00 PM

## 2022-11-03 NOTE — Death Summary Note (Signed)
[facility] Death Note                                                                                                                                                                                               [name], is a 67 y.o. male, DOB - 12-23-1955, [id number]  Admit date - 11/01/22   Admitting Physician [name], MD  Outpatient Primary MD for the patient is Fenton Foy, NP  LOS - 7  Chief Complaint  Patient presents with   Altered Mental Status       Notification: Fenton Foy, NP notified of death of 11-08-2022   Admit Date:  2022/11/01  Date of Death:   11/08/22  Time of Death:  15:18  Length of Stay: 7   Pronounced by - RN  History of present illness:   Hays Granucci is a 67 y.o. male with a history of - 67 year old male with reported COPD, HIV, hypertension, bladder tumor alcohol use brought by EMS for altered mental status after a neighbor called for him as he was not acting like himself, smell of urine and a urine bottles everywhere and he has had urinary retention x 5 days.    Patient was diagnosed with severe malnutrition, cachexia, dehydration, AKI, aspiration pneumonia and was admitted to the hospital.  He was an extremely poor state of health and hygiene.   Despite full Rx he continued to decline had detailed discussion with patient and patient's primary decision maker brother [name] bedside on multiple occasions, on 07-Nov-2018 for brother [name] suggested that he thinks patient is continuously suffering and will be making any progress, he expresses wishes that we transition to full comfort measures.  I agree with this plan.  He will be transition to full comfort measures.  All non comfort medications were stopped on 2022-11-07 >> Full comfort care, died peacefully on 11/08/22.    Final Diagnoses:  Cause if death - Pneumonia  Signature  -    Lala Lund M.D on 08-Nov-2022 at 3:25 PM   -  To page go to [email or web address]   Total clinical and documentation time for today Under 30 minutes   Last Note   PROGRESS NOTE Romello Spielberger  [id number] DOB: 02/17/1956 DOA: 2022-11-01 PCP: Fenton Foy, NP  Brief Narrative/Hospital Course: 67 year old male with reported COPD, HIV, hypertension, bladder tumor alcohol use brought by EMS for altered mental status after a neighbor called for him as he was not acting like himself, smell of urine and a urine bottles everywhere and he has had urinary retention x 5 days.  Patient was diagnosed with severe malnutrition, cachexia, dehydration, AKI, aspiration pneumonia and was admitted to the hospital.  He was an extremely poor state of health and hygiene.   Subjective:  Patient in bed unresponsive somewhat uncomfortable due to respiratory distress, unable to answer questions or follow commands.  Assessment and Plan:   Had detailed discussion with patient and patient's primary decision maker brother [name] bedside on multiple occasions, on 10/30/2018 for brother [name] suggested that he thinks patient is continuously suffering and will be making any progress, he expresses wishes that we transition to full comfort measures.  I agree with this plan.  He will be transition to full comfort measures.  All non comfort medications will be stopped on 10/30/2022.  Comfort medications further adjusted on 11/06/22, death looks imminent.      Now on full comfort measures, all known comfort medications stopped.  Other problems which were actively addressed earlier this admission are below.     Severe AKI, Dehydration, Metabolic acidosis due to AKI: in a patient with history of HIV, alcohol abuse, severe malnutrition and cachexia, ongoing aspiration.  Bladder scan negative, renal ultrasound chronic medical renal disease without hydronephrosis.Creat improving with IV fluid > cut to 50 cc/hr bnp elevated this am cxr  clear tho,hydration, monitor electrolytes, urine output. AKI is most likely prerenal. Avoid nephrotoxic medication,dose meds renally.   Recent Labs    12/31/21 1647 04/23/22 1534 10/30/2022 2016 10/25/22 0238 10/26/22 0144 10/27/22 0647 10/28/22 0511 10/29/22 0255 10/30/22 0419  BUN 14 12 102* 100* 63* 35* 27* 23 28*  CREATININE 1.06 1.01 5.78* 4.67* 2.18* 1.59* 1.63* 1.67* 2.23*   NSTEMI, Elevated troponin with ST and T wave changes in anterior leads-NSTEMI, Tachycardia w/ narrow complex tachycardia/NSVT:  Seen by cardiology underwent echocardiogram showing low normal EF with moderate to severe AS, was given few days of amiodarone and heparin drip, not a candidate for invasive testing or procedures.  Currently chest pain-free.  Gentle medical treatment per cardiology.  Case discussed with cardiologist Dr. Virgina Jock.  HIV disease followed by ID last CD4 count stable /viral load elevated as below.Continue home [place].  Per [name] his brother- patient's rest of the family-sisters  not aware of this diagnosis and only [name] is aware who has been helping him with his care.    Lab Results  Component Value Date   CD4TABS 405 02/18/2022   CD4TABS 553 12/31/2021   CD4TABS 443 04/02/2021   Lab Results  Component Value Date   HIV1RNAQUANT 109,000 (H) 02/18/2022     Alcohol abuse: Level less than 10, no withdrawals,watch for withdrawal symptoms and address accordingly, patient last drink was several days ago. Cont thiamine folate vitamins-monitor  ?Seizure disorder per his brother- not on AEDS. Monitor  Hypokalemia.  Replaced.    Poor historian/altered mental status with confusion likely acute metabolic encephalopathy: Continue supportive care gentle hydration  Asthma:not wheezing, not in exacerbation.  Depression: Mood is stable  Hypertension BP controlled on home amlodipine> will discontinue for now as placed him on metoprolol  Anemia likely from chronic disease HIV, initial  hemoglobin high at 13 g likely hemoconcentrated.  His hemoglobin was in low 10 g 6 months ago- monitor hb  History of bladder cancer-reportedly patient was having hematuria on last follow-up with ID, question of recurrent bladder cancer, will need to monitor closely while on heparin.  Severe malnutrition BMI 16.9,dietitian consulted-augment aggressively status.Speech eval for swallowing.  Spitting during eating and taking meds:speech eval requested- cont on modified diet.  Goals of care: Had detailed discussion with patient's brother who is the primary decision maker bedside on 10/27/2022, he understands that his brother may pass away this hospitalization, he wants to continue gentle medical treatment, if any further decline he confirms that he will be pursuing full comfort measures at that time.  DNR.  Pressure ulcer STAGE 2  left buttock POA see below Pressure Injury 10/25/22 Buttocks Left Stage 2 -  Partial thickness loss of dermis presenting as a shallow open injury with a red, pink wound bed without slough. (Active)  10/25/22 0100  Location: Buttocks  Location Orientation: Left  Staging: Stage 2 -  Partial thickness loss of dermis presenting as a shallow open injury with a red, pink wound bed without slough.  Wound Description (Comments):   Present on Admission: Yes  Dressing Type Foam - Lift dressing to assess site every shift 10/25/22 0111   DVT prophylaxis: Heparin Code Status:   Code Status: DNR Family Communication: Mike bedside on 10/27/2022, DNR, gentle medical treatment, if declines full comfort care.  Brother updated again bedside 10/28/2022, 10/29/2022, 10/30/22, 11-03-2022  Patient status is: Inpatient because of AKI Level of care: MedSurg bed  Dispo: The patient is from: Home            Anticipated disposition: TBD Objective: Vitals last 24 hrs: Vitals:   10/29/22 2000 10/30/22 0740 10/30/22 2000 2022-11-03 0500  BP: (!) 111/57 124/67 132/72 (!) 141/62  Pulse: 90 (!) 111 (!)  119 98  Resp: 19 20 (!) 21 16  Temp: 98 F (36.7 C) 98.2 F (36.8 C) 98 F (36.7 C) 98 F (36.7 C)  TempSrc: Oral Axillary Axillary Oral  SpO2: 100% 97% 92% 98%  Weight:    48.1 kg  Height:       Weight change:   Physical Examination:  Frail and cachectic middle-aged African-American male lying in hospital bed in Resp. distress gasping for air, [place].AT,PERRAL Supple Neck, No JVD,   Symmetrical Chest wall movement, Good air movement bilaterally, bilateral coarse breath sounds RRR,No Gallops, Rubs or new Murmurs,  +ve B.Sounds, Abd Soft, No tenderness,   No Cyanosis, Clubbing or edema    Medications reviewed:  Scheduled Meds:   Continuous Infusions:  chlorproMAZINE (THORAZINE) 12.5 mg in sodium chloride 0.9 % 25 mL IVPB     Diet Order             Diet clear liquid Room service appropriate? Yes; Fluid consistency: Nectar Thick; Fluid restriction: 1200 mL Fluid  Diet effective now                  Intake/Output Summary (Last 24 hours) at 11-03-2022 1525 Last data filed at 10/30/2022 1830 Gross per 24 hour  Intake --  Output 300 ml  Net -300 ml   Net IO Since Admission: -2,107.78 mL [2022/11/03 1525]  Wt Readings from Last 3 Encounters:  11/03/22 48.1 kg  04/23/22 54.4 kg  02/18/22 54 kg    Data Reviewed: I have personally reviewed following labs and imaging studies  Recent Labs  Lab 10/17/2022 2016 10/26/2022 2021 10/26/22 0144 10/27/22 0647 10/28/22 0511 10/29/22 0255 10/30/22 0419  WBC 8.7   < > 4.5 6.8 6.5 6.2 6.5  HGB 13.2   < > 9.6* 9.9* 8.8* 9.7* 10.1*  HCT 38.9*   < > 28.5* 31.3* 26.7* 29.7* 31.7*  PLT 234   < > 151 135* 132* 145* 140*  MCV 102.1*   < > 101.1* 105.4* 103.5* 102.4*  104.6*  MCH 34.6*   < > 34.0 33.3 34.1* 33.4 33.3  MCHC 33.9   < > 33.7 31.6 33.0 32.7 31.9  RDW 13.9   < > 13.6 14.2 14.3 14.4 14.6  LYMPHSABS 1.0  --   --   --   --   --   --   MONOABS 0.6  --   --   --   --   --   --   EOSABS 0.0  --   --   --   --   --   --    BASOSABS 0.0  --   --   --   --   --   --    < > = values in this interval not displayed.    Recent Labs  Lab 10/27/2022 2016 10/25/2022 2021 10/25/22 0238 10/26/22 0144 10/26/22 0912 10/26/22 0913 10/27/22 0647 10/28/22 0511 10/29/22 0255 10/30/22 0419  NA 134*   < > 136 143  --   --  145 143 145 146*  K 4.8   < > 5.0 3.7  --   --  3.7 3.4* 3.7 4.1  CL 102  --  108 121*  --   --  123* 123* 119* 123*  CO2 14*  --  17* 17*  --   --  18* 14* 21* 16*  ANIONGAP 18*  --  11 5  --   --  4* 6 5 7   GLUCOSE 130*  --  112* 143*  --   --  124* 120* 98 80  BUN 102*  --  100* 63*  --   --  35* 27* 23 28*  CREATININE 5.78*  --  4.67* 2.18*  --   --  1.59* 1.63* 1.67* 2.23*  AST 63*  --   --   --   --   --   --   --   --   --   ALT 30  --   --   --   --   --   --   --   --   --   ALKPHOS 80  --   --   --   --   --   --   --   --   --   BILITOT 1.0  --   --   --   --   --   --   --   --   --   ALBUMIN 2.7*  --  2.0*  --   --   --   --   --   --   --   TSH  --   --   --   --  1.186  --   --   --   --   --   BNP  --   --   --   --   --  1,293.6* 853.6* 1,161.9* 976.7* 814.4*  MG  --   --   --   --   --   --  2.3 2.2 1.9 2.2  CALCIUM 9.4  --  8.5* 7.9*  --   --  7.8* 7.4* 7.7* 7.6*   < > = values in this interval not displayed.      Recent Labs  Lab 10/26/22 0144 10/26/22 0912 10/26/22 0913 10/27/22 [id number] 10/28/22 0511 10/29/22 0255 10/30/22 0419  TSH  --  1.186  --   --   --   --   --  BNP  --   --  1,293.6* 853.6* 1,161.9* 976.7* 814.4*  MG  --   --   --  2.3 2.2 1.9 2.2  CALCIUM 7.9*  --   --  7.8* 7.4* 7.7* 7.6*      Antimicrobials: Anti-infectives (From admission, onward)    Start     Dose/Rate Route Frequency Ordered Stop   10/25/22 0845  Darunavir-Cobicistat-Emtricitabine-Tenofovir Alafenamide (SYMTUZA) 800-150-200-10 MG TABS 1 tablet  Status:  Discontinued        1 tablet Oral Daily with breakfast 10/29/2022 2310 10/30/22 0837      Culture/Microbiology    Component  Value Date/Time   SDES BLOOD RIGHT FOREARM 09/10/2016 0319   SPECREQUEST BOTTLES DRAWN AEROBIC AND ANAEROBIC 10CC EA 09/10/2016 0319   CULT NO GROWTH 5 DAYS 09/10/2016 0319   REPTSTATUS 09/15/2016 FINAL 09/10/2016 0319   Other culture-see note  Radiology Studies: DG Chest Port 1 View  Result Date: 10/30/2022 CLINICAL DATA:  Shortness of breath. EXAM: PORTABLE CHEST 1 VIEW COMPARISON:  10/29/2022 FINDINGS: The lungs are clear without focal pneumonia, edema, pneumothorax or pleural effusion. The cardiopericardial silhouette is within normal limits for size. Healed posterior right tenth rib fracture. Single nodular densities over each lower lung seen on the previous study are not as evident, but do persist. Telemetry pad overlies the left upper chest . IMPRESSION: 1. No acute cardiopulmonary findings. 2. Subtle single nodular density over each lung base as on the recent comparison study. Presumed nipple shadows, consider repeat frontal radiograph with nipple markers when able. Electronically Signed   By: Misty Stanley M.D.   On: 10/30/2022 06:24     LOS: 7 days   Signature  -    Lala Lund M.D on 11/07/22 at 3:25 PM   -  To page go to [email or web address]

## 2022-11-03 NOTE — Care Management Important Message (Deleted)
Important Message  Patient Details  Name: [name] MRN: [id number] Date of Birth: 29-Sep-1955   Medicare Important Message Given:  Yes     Hannah Beat 11-05-2022, 12:42 PM

## 2022-11-03 NOTE — Progress Notes (Addendum)
PROGRESS NOTE [name]  [id number] DOB: 02-15-1956 DOA: 11/02/2022 PCP: Fenton Foy, NP  Brief Narrative/Hospital Course: 67 year old male with reported COPD, HIV, hypertension, bladder tumor alcohol use brought by EMS for altered mental status after a neighbor called for him as he was not acting like himself, smell of urine and a urine bottles everywhere and he has had urinary retention x 5 days.  Patient was diagnosed with severe malnutrition, cachexia, dehydration, AKI, aspiration pneumonia and was admitted to the hospital.  He was an extremely poor state of health and hygiene.   Subjective:  Patient in bed unresponsive somewhat uncomfortable due to respiratory distress, unable to answer questions or follow commands.  Assessment and Plan:   Had detailed discussion with patient and patient's primary decision maker brother [name] bedside on multiple occasions, on 10/30/2018 for brother [name] suggested that he thinks patient is continuously suffering and will be making any progress, he expresses wishes that we transition to full comfort measures.  I agree with this plan.  He will be transition to full comfort measures.  All noncovered medications will be stopped on 10/30/2022.  Comfort medications further adjusted on 21-Nov-2022, death looks imminent.      Now on full comfort measures, all known comfort medications stopped.  Other problems which were actively addressed earlier this admission are below.     Severe AKI, Dehydration, Metabolic acidosis due to AKI: in a patient with history of HIV, alcohol abuse, severe malnutrition and cachexia, ongoing aspiration.  Bladder scan negative, renal ultrasound chronic medical renal disease without hydronephrosis.Creat improving with IV fluid > cut to 50 cc/hr bnp elevated this am cxr clear tho,hydration, monitor electrolytes, urine output. AKI is most likely prerenal. Avoid nephrotoxic medication,dose meds renally.   Recent Labs     12/31/21 1647 04/23/22 1534 10/30/2022 2016 10/25/22 0238 10/26/22 0144 10/27/22 0647 10/28/22 0511 10/29/22 0255 10/30/22 0419  BUN 14 12 102* 100* 63* 35* 27* 23 28*  CREATININE 1.06 1.01 5.78* 4.67* 2.18* 1.59* 1.63* 1.67* 2.23*   NSTEMI, Elevated troponin with ST and T wave changes in anterior leads-NSTEMI, Tachycardia w/ narrow complex tachycardia/NSVT:  Seen by cardiology underwent echocardiogram showing low normal EF with moderate to severe AS, was given few days of amiodarone and heparin drip, not a candidate for invasive testing or procedures.  Currently chest pain-free.  Gentle medical treatment per cardiology.  Case discussed with cardiologist Dr. Virgina Jock.  HIV disease followed by ID last CD4 count stable /viral load elevated as below.Continue home [place].  Per [name] his brother- patient's rest of the family-sisters  not aware of this diagnosis and only [name] is aware who has been helping him with his care.    Lab Results  Component Value Date   CD4TABS 405 02/18/2022   CD4TABS 553 12/31/2021   CD4TABS 443 04/02/2021   Lab Results  Component Value Date   HIV1RNAQUANT 109,000 (H) 02/18/2022     Alcohol abuse: Level less than 10, no withdrawals,watch for withdrawal symptoms and address accordingly, patient last drink was several days ago. Cont thiamine folate vitamins-monitor  ?Seizure disorder per his brother- not on AEDS. Monitor  Hypokalemia.  Replaced.    Poor historian/altered mental status with confusion likely acute metabolic encephalopathy: Continue supportive care gentle hydration  Asthma:not wheezing, not in exacerbation.  Depression: Mood is stable  Hypertension BP controlled on home amlodipine> will discontinue for now as placed him on metoprolol  Anemia likely from chronic disease HIV, initial hemoglobin high at 13 g likely  hemoconcentrated.  His hemoglobin was in low 10 g 6 months ago- monitor hb  History of bladder cancer-reportedly patient  was having hematuria on last follow-up with ID, question of recurrent bladder cancer, will need to monitor closely while on heparin.  Severe malnutrition BMI 16.9,dietitian consulted-augment aggressively status.Speech eval for swallowing.  Spitting during eating and taking meds:speech eval requested- cont on modified diet.  Goals of care: Had detailed discussion with patient's brother who is the primary decision maker bedside on 10/27/2022, he understands that his brother may pass away this hospitalization, he wants to continue gentle medical treatment, if any further decline he confirms that he will be pursuing full comfort measures at that time.  DNR.  Pressure ulcer STAGE 2  left buttock POA see below Pressure Injury 10/25/22 Buttocks Left Stage 2 -  Partial thickness loss of dermis presenting as a shallow open injury with a red, pink wound bed without slough. (Active)  10/25/22 0100  Location: Buttocks  Location Orientation: Left  Staging: Stage 2 -  Partial thickness loss of dermis presenting as a shallow open injury with a red, pink wound bed without slough.  Wound Description (Comments):   Present on Admission: Yes  Dressing Type Foam - Lift dressing to assess site every shift 10/25/22 0111   DVT prophylaxis: Heparin Code Status:   Code Status: DNR Family Communication: Mike bedside on 10/27/2022, DNR, gentle medical treatment, if declines full comfort care.  Brother updated again bedside 10/28/2022, 10/29/2022 Patient status is: Inpatient because of AKI Level of care: MedSurg bed  Dispo: The patient is from: Home            Anticipated disposition: TBD Objective: Vitals last 24 hrs: Vitals:   10/29/22 2000 10/30/22 0740 10/30/22 2000 November 27, 2022 0500  BP: (!) 111/57 124/67 132/72 (!) 141/62  Pulse: 90 (!) 111 (!) 119 98  Resp: 19 20 (!) 21 16  Temp: 98 F (36.7 C) 98.2 F (36.8 C) 98 F (36.7 C) 98 F (36.7 C)  TempSrc: Oral Axillary Axillary Oral  SpO2: 100% 97% 92% 98%   Weight:    48.1 kg  Height:       Weight change:   Physical Examination:  Frail and cachectic middle-aged African-American male lying in hospital bed in Resp. distress gasping for air, [place].AT,PERRAL Supple Neck, No JVD,   Symmetrical Chest wall movement, Good air movement bilaterally, bilateral coarse breath sounds RRR,No Gallops, Rubs or new Murmurs,  +ve B.Sounds, Abd Soft, No tenderness,   No Cyanosis, Clubbing or edema    Medications reviewed:  Scheduled Meds:   Continuous Infusions:  chlorproMAZINE (THORAZINE) 12.5 mg in sodium chloride 0.9 % 25 mL IVPB     Diet Order             Diet clear liquid Room service appropriate? Yes; Fluid consistency: Nectar Thick; Fluid restriction: 1200 mL Fluid  Diet effective now                  Intake/Output Summary (Last 24 hours) at 11-27-22 0908 Last data filed at 10/30/2022 1830 Gross per 24 hour  Intake --  Output 300 ml  Net -300 ml   Net IO Since Admission: -2,107.78 mL [Nov 27, 2022 0908]  Wt Readings from Last 3 Encounters:  2022-11-27 48.1 kg  04/23/22 54.4 kg  02/18/22 54 kg    Data Reviewed: I have personally reviewed following labs and imaging studies  Recent Labs  Lab 10/25/2022 2016 10/21/2022 2021 10/26/22 0144 10/27/22 0647 10/28/22  [id number] 10/29/22 0255 10/30/22 0419  WBC 8.7   < > 4.5 6.8 6.5 6.2 6.5  HGB 13.2   < > 9.6* 9.9* 8.8* 9.7* 10.1*  HCT 38.9*   < > 28.5* 31.3* 26.7* 29.7* 31.7*  PLT 234   < > 151 135* 132* 145* 140*  MCV 102.1*   < > 101.1* 105.4* 103.5* 102.4* 104.6*  MCH 34.6*   < > 34.0 33.3 34.1* 33.4 33.3  MCHC 33.9   < > 33.7 31.6 33.0 32.7 31.9  RDW 13.9   < > 13.6 14.2 14.3 14.4 14.6  LYMPHSABS 1.0  --   --   --   --   --   --   MONOABS 0.6  --   --   --   --   --   --   EOSABS 0.0  --   --   --   --   --   --   BASOSABS 0.0  --   --   --   --   --   --    < > = values in this interval not displayed.    Recent Labs  Lab 10/17/2022 2016 11/02/2022 2021 10/25/22 0238 10/26/22 0144  10/26/22 0912 10/26/22 0913 10/27/22 0647 10/28/22 0511 10/29/22 0255 10/30/22 0419  NA 134*   < > 136 143  --   --  145 143 145 146*  K 4.8   < > 5.0 3.7  --   --  3.7 3.4* 3.7 4.1  CL 102  --  108 121*  --   --  123* 123* 119* 123*  CO2 14*  --  17* 17*  --   --  18* 14* 21* 16*  ANIONGAP 18*  --  11 5  --   --  4* 6 5 7   GLUCOSE 130*  --  112* 143*  --   --  124* 120* 98 80  BUN 102*  --  100* 63*  --   --  35* 27* 23 28*  CREATININE 5.78*  --  4.67* 2.18*  --   --  1.59* 1.63* 1.67* 2.23*  AST 63*  --   --   --   --   --   --   --   --   --   ALT 30  --   --   --   --   --   --   --   --   --   ALKPHOS 80  --   --   --   --   --   --   --   --   --   BILITOT 1.0  --   --   --   --   --   --   --   --   --   ALBUMIN 2.7*  --  2.0*  --   --   --   --   --   --   --   TSH  --   --   --   --  1.186  --   --   --   --   --   BNP  --   --   --   --   --  1,293.6* 853.6* 1,161.9* 976.7* 814.4*  MG  --   --   --   --   --   --  2.3 2.2 1.9 2.2  CALCIUM 9.4  --  8.5* 7.9*  --   --  7.8* 7.4* 7.7* 7.6*   < > = values in this interval not displayed.      Recent Labs  Lab 10/26/22 0144 10/26/22 0912 10/26/22 0913 10/27/22 0647 10/28/22 0511 10/29/22 0255 10/30/22 0419  TSH  --  1.186  --   --   --   --   --   BNP  --   --  1,293.6* 853.6* 1,161.9* 976.7* 814.4*  MG  --   --   --  2.3 2.2 1.9 2.2  CALCIUM 7.9*  --   --  7.8* 7.4* 7.7* 7.6*      Antimicrobials: Anti-infectives (From admission, onward)    Start     Dose/Rate Route Frequency Ordered Stop   10/25/22 0845  Darunavir-Cobicistat-Emtricitabine-Tenofovir Alafenamide (SYMTUZA) 800-150-200-10 MG TABS 1 tablet  Status:  Discontinued        1 tablet Oral Daily with breakfast 10/30/2022 2310 10/30/22 0837      Culture/Microbiology    Component Value Date/Time   SDES BLOOD RIGHT FOREARM 09/10/2016 0319   SPECREQUEST BOTTLES DRAWN AEROBIC AND ANAEROBIC 10CC EA 09/10/2016 0319   CULT NO GROWTH 5 DAYS 09/10/2016 0319    REPTSTATUS 09/15/2016 FINAL 09/10/2016 0319   Other culture-see note  Radiology Studies: DG Chest Port 1 View  Result Date: 10/30/2022 CLINICAL DATA:  Shortness of breath. EXAM: PORTABLE CHEST 1 VIEW COMPARISON:  10/29/2022 FINDINGS: The lungs are clear without focal pneumonia, edema, pneumothorax or pleural effusion. The cardiopericardial silhouette is within normal limits for size. Healed posterior right tenth rib fracture. Single nodular densities over each lower lung seen on the previous study are not as evident, but do persist. Telemetry pad overlies the left upper chest . IMPRESSION: 1. No acute cardiopulmonary findings. 2. Subtle single nodular density over each lung base as on the recent comparison study. Presumed nipple shadows, consider repeat frontal radiograph with nipple markers when able. Electronically Signed   By: Misty Stanley M.D.   On: 10/30/2022 06:24     LOS: 7 days   Signature  -    Lala Lund M.D on 2022-11-06 at 9:08 AM   -  To page go to [email or web address]

## 2022-11-03 DEATH — deceased

## 2022-11-11 ENCOUNTER — Other Ambulatory Visit (HOSPITAL_COMMUNITY): Payer: Self-pay
